# Patient Record
Sex: Female | Born: 1935
Health system: Southern US, Community
[De-identification: ages and names within clinical notes are randomized; demographics above are authoritative.]

## PROBLEM LIST (undated history)

## (undated) DIAGNOSIS — E785 Hyperlipidemia, unspecified: Secondary | ICD-10-CM

## (undated) DIAGNOSIS — G629 Polyneuropathy, unspecified: Secondary | ICD-10-CM

## (undated) DIAGNOSIS — D649 Anemia, unspecified: Secondary | ICD-10-CM

## (undated) DIAGNOSIS — C8580 Other specified types of non-Hodgkin lymphoma, unspecified site: Secondary | ICD-10-CM

## (undated) DIAGNOSIS — R161 Splenomegaly, not elsewhere classified: Secondary | ICD-10-CM

## (undated) DIAGNOSIS — Z973 Presence of spectacles and contact lenses: Secondary | ICD-10-CM

## (undated) DIAGNOSIS — I10 Essential (primary) hypertension: Secondary | ICD-10-CM

## (undated) DIAGNOSIS — R634 Abnormal weight loss: Secondary | ICD-10-CM

## (undated) DIAGNOSIS — E119 Type 2 diabetes mellitus without complications: Secondary | ICD-10-CM

## (undated) HISTORY — PX: TONSILLECTOMY: SUR1361

## (undated) HISTORY — DX: Essential (primary) hypertension: I10

## (undated) HISTORY — DX: Hyperlipidemia, unspecified: E78.5

## (undated) HISTORY — DX: Polyneuropathy, unspecified: G62.9

## (undated) HISTORY — DX: Abnormal weight loss: R63.4

## (undated) HISTORY — PX: COLONOSCOPY: SHX174

## (undated) HISTORY — DX: Type 2 diabetes mellitus without complications: E11.9

## (undated) HISTORY — PX: ESOPHAGOGASTRODUODENOSCOPY: SHX1529

## (undated) HISTORY — DX: Other specified types of non-hodgkin lymphoma, unspecified site: C85.80

## (undated) HISTORY — DX: Anemia, unspecified: D64.9

## (undated) HISTORY — DX: Splenomegaly, not elsewhere classified: R16.1

## (undated) HISTORY — PX: BREAST BIOPSY: SHX20

---

## 1987-01-29 HISTORY — PX: BREAST SURGERY: SHX581

## 1987-01-29 HISTORY — PX: POLYPECTOMY: SHX149

## 1997-07-04 ENCOUNTER — Other Ambulatory Visit: Admission: RE | Admit: 1997-07-04 | Discharge: 1997-07-04 | Payer: Self-pay | Admitting: *Deleted

## 1997-07-12 ENCOUNTER — Ambulatory Visit (HOSPITAL_COMMUNITY): Admission: RE | Admit: 1997-07-12 | Discharge: 1997-07-12 | Payer: Self-pay | Admitting: *Deleted

## 1998-03-29 ENCOUNTER — Other Ambulatory Visit: Admission: RE | Admit: 1998-03-29 | Discharge: 1998-03-29 | Payer: Self-pay | Admitting: *Deleted

## 1998-05-09 ENCOUNTER — Ambulatory Visit (HOSPITAL_COMMUNITY): Admission: RE | Admit: 1998-05-09 | Discharge: 1998-05-09 | Payer: Self-pay | Admitting: *Deleted

## 1998-05-11 ENCOUNTER — Encounter: Admission: RE | Admit: 1998-05-11 | Discharge: 1998-08-09 | Payer: Self-pay | Admitting: *Deleted

## 1998-07-14 ENCOUNTER — Ambulatory Visit (HOSPITAL_COMMUNITY): Admission: RE | Admit: 1998-07-14 | Discharge: 1998-07-14 | Payer: Self-pay | Admitting: *Deleted

## 1999-04-02 ENCOUNTER — Other Ambulatory Visit: Admission: RE | Admit: 1999-04-02 | Discharge: 1999-04-02 | Payer: Self-pay | Admitting: *Deleted

## 1999-10-10 ENCOUNTER — Ambulatory Visit (HOSPITAL_COMMUNITY): Admission: RE | Admit: 1999-10-10 | Discharge: 1999-10-10 | Payer: Self-pay | Admitting: *Deleted

## 2000-05-01 ENCOUNTER — Other Ambulatory Visit: Admission: RE | Admit: 2000-05-01 | Discharge: 2000-05-01 | Payer: Self-pay | Admitting: Internal Medicine

## 2000-05-22 ENCOUNTER — Ambulatory Visit (HOSPITAL_COMMUNITY): Admission: RE | Admit: 2000-05-22 | Discharge: 2000-05-22 | Payer: Self-pay | Admitting: Internal Medicine

## 2000-05-22 ENCOUNTER — Encounter: Payer: Self-pay | Admitting: Internal Medicine

## 2000-12-04 ENCOUNTER — Ambulatory Visit (HOSPITAL_COMMUNITY): Admission: RE | Admit: 2000-12-04 | Discharge: 2000-12-04 | Payer: Self-pay | Admitting: Internal Medicine

## 2000-12-04 ENCOUNTER — Encounter: Payer: Self-pay | Admitting: Internal Medicine

## 2001-05-06 ENCOUNTER — Other Ambulatory Visit: Admission: RE | Admit: 2001-05-06 | Discharge: 2001-05-06 | Payer: Self-pay | Admitting: Internal Medicine

## 2002-02-12 ENCOUNTER — Ambulatory Visit (HOSPITAL_COMMUNITY): Admission: RE | Admit: 2002-02-12 | Discharge: 2002-02-12 | Payer: Self-pay | Admitting: Internal Medicine

## 2002-02-12 ENCOUNTER — Encounter: Payer: Self-pay | Admitting: Internal Medicine

## 2002-02-19 ENCOUNTER — Encounter: Admission: RE | Admit: 2002-02-19 | Discharge: 2002-02-19 | Payer: Self-pay | Admitting: Internal Medicine

## 2002-02-19 ENCOUNTER — Encounter: Payer: Self-pay | Admitting: Internal Medicine

## 2003-06-02 ENCOUNTER — Other Ambulatory Visit: Admission: RE | Admit: 2003-06-02 | Discharge: 2003-06-02 | Payer: Self-pay | Admitting: Internal Medicine

## 2003-06-06 ENCOUNTER — Ambulatory Visit (HOSPITAL_COMMUNITY): Admission: RE | Admit: 2003-06-06 | Discharge: 2003-06-06 | Payer: Self-pay | Admitting: Internal Medicine

## 2003-11-04 ENCOUNTER — Emergency Department (HOSPITAL_COMMUNITY): Admission: EM | Admit: 2003-11-04 | Discharge: 2003-11-05 | Payer: Self-pay | Admitting: Emergency Medicine

## 2004-07-30 ENCOUNTER — Ambulatory Visit (HOSPITAL_COMMUNITY): Admission: RE | Admit: 2004-07-30 | Discharge: 2004-07-30 | Payer: Self-pay | Admitting: Internal Medicine

## 2005-06-12 ENCOUNTER — Other Ambulatory Visit: Admission: RE | Admit: 2005-06-12 | Discharge: 2005-06-12 | Payer: Self-pay | Admitting: Internal Medicine

## 2005-08-02 ENCOUNTER — Ambulatory Visit (HOSPITAL_COMMUNITY): Admission: RE | Admit: 2005-08-02 | Discharge: 2005-08-02 | Payer: Self-pay | Admitting: Internal Medicine

## 2005-10-29 ENCOUNTER — Ambulatory Visit: Payer: Self-pay | Admitting: Gastroenterology

## 2005-11-19 ENCOUNTER — Ambulatory Visit: Payer: Self-pay | Admitting: Gastroenterology

## 2006-10-08 ENCOUNTER — Ambulatory Visit (HOSPITAL_COMMUNITY): Admission: RE | Admit: 2006-10-08 | Discharge: 2006-10-08 | Payer: Self-pay | Admitting: Internal Medicine

## 2007-02-04 ENCOUNTER — Encounter: Admission: RE | Admit: 2007-02-04 | Discharge: 2007-02-04 | Payer: Self-pay | Admitting: Internal Medicine

## 2007-10-21 ENCOUNTER — Ambulatory Visit (HOSPITAL_COMMUNITY): Admission: RE | Admit: 2007-10-21 | Discharge: 2007-10-21 | Payer: Self-pay | Admitting: Internal Medicine

## 2008-11-18 ENCOUNTER — Ambulatory Visit (HOSPITAL_COMMUNITY): Admission: RE | Admit: 2008-11-18 | Discharge: 2008-11-18 | Payer: Self-pay | Admitting: Internal Medicine

## 2009-12-26 ENCOUNTER — Ambulatory Visit (HOSPITAL_COMMUNITY)
Admission: RE | Admit: 2009-12-26 | Discharge: 2009-12-26 | Payer: Self-pay | Source: Home / Self Care | Admitting: Internal Medicine

## 2010-11-09 ENCOUNTER — Other Ambulatory Visit (HOSPITAL_COMMUNITY): Payer: Self-pay | Admitting: Internal Medicine

## 2010-11-09 DIAGNOSIS — Z1231 Encounter for screening mammogram for malignant neoplasm of breast: Secondary | ICD-10-CM

## 2010-11-09 DIAGNOSIS — M8580 Other specified disorders of bone density and structure, unspecified site: Secondary | ICD-10-CM

## 2011-01-01 ENCOUNTER — Ambulatory Visit (HOSPITAL_COMMUNITY): Payer: Self-pay

## 2011-01-01 ENCOUNTER — Inpatient Hospital Stay (HOSPITAL_COMMUNITY)
Admission: RE | Admit: 2011-01-01 | Discharge: 2011-01-01 | Payer: Self-pay | Source: Ambulatory Visit | Attending: Internal Medicine | Admitting: Internal Medicine

## 2011-02-05 ENCOUNTER — Ambulatory Visit (HOSPITAL_COMMUNITY)
Admission: RE | Admit: 2011-02-05 | Discharge: 2011-02-05 | Disposition: A | Discharge status: Home / Self Care | Payer: Medicare Other | Source: Ambulatory Visit | Attending: Internal Medicine | Admitting: Internal Medicine

## 2011-02-05 DIAGNOSIS — M8580 Other specified disorders of bone density and structure, unspecified site: Secondary | ICD-10-CM

## 2011-02-05 DIAGNOSIS — Z1231 Encounter for screening mammogram for malignant neoplasm of breast: Secondary | ICD-10-CM | POA: Insufficient documentation

## 2011-02-28 ENCOUNTER — Other Ambulatory Visit (HOSPITAL_COMMUNITY): Payer: Self-pay | Admitting: Internal Medicine

## 2011-02-28 ENCOUNTER — Ambulatory Visit (HOSPITAL_COMMUNITY)
Admission: RE | Admit: 2011-02-28 | Discharge: 2011-02-28 | Disposition: A | Discharge status: Home / Self Care | Payer: Medicare Other | Source: Ambulatory Visit | Attending: Internal Medicine | Admitting: Internal Medicine

## 2011-02-28 DIAGNOSIS — M545 Low back pain, unspecified: Secondary | ICD-10-CM | POA: Insufficient documentation

## 2011-05-30 ENCOUNTER — Encounter: Payer: Self-pay | Admitting: Gastroenterology

## 2011-06-27 ENCOUNTER — Ambulatory Visit: Payer: Medicare Other | Admitting: Gastroenterology

## 2012-09-21 ENCOUNTER — Other Ambulatory Visit (HOSPITAL_COMMUNITY): Payer: Self-pay | Admitting: Internal Medicine

## 2012-09-21 DIAGNOSIS — Z1231 Encounter for screening mammogram for malignant neoplasm of breast: Secondary | ICD-10-CM

## 2012-09-22 ENCOUNTER — Ambulatory Visit (HOSPITAL_COMMUNITY)
Admission: RE | Admit: 2012-09-22 | Discharge: 2012-09-22 | Disposition: A | Discharge status: Home / Self Care | Payer: Medicare Other | Source: Ambulatory Visit | Attending: Internal Medicine | Admitting: Internal Medicine

## 2012-09-22 DIAGNOSIS — Z1231 Encounter for screening mammogram for malignant neoplasm of breast: Secondary | ICD-10-CM | POA: Insufficient documentation

## 2012-10-13 ENCOUNTER — Encounter: Payer: Self-pay | Admitting: Gastroenterology

## 2012-11-13 ENCOUNTER — Encounter: Payer: Self-pay | Admitting: Gastroenterology

## 2012-11-13 ENCOUNTER — Ambulatory Visit (INDEPENDENT_AMBULATORY_CARE_PROVIDER_SITE_OTHER): Payer: Medicare Other | Admitting: Gastroenterology

## 2012-11-13 ENCOUNTER — Other Ambulatory Visit (INDEPENDENT_AMBULATORY_CARE_PROVIDER_SITE_OTHER): Payer: Medicare Other

## 2012-11-13 VITALS — BP 138/80 | HR 73 | Ht 66.0 in | Wt 178.0 lb

## 2012-11-13 DIAGNOSIS — Z860100 Personal history of colon polyps, unspecified: Secondary | ICD-10-CM | POA: Insufficient documentation

## 2012-11-13 DIAGNOSIS — D500 Iron deficiency anemia secondary to blood loss (chronic): Secondary | ICD-10-CM | POA: Insufficient documentation

## 2012-11-13 DIAGNOSIS — D649 Anemia, unspecified: Secondary | ICD-10-CM

## 2012-11-13 DIAGNOSIS — D509 Iron deficiency anemia, unspecified: Secondary | ICD-10-CM | POA: Insufficient documentation

## 2012-11-13 LAB — CBC WITH DIFFERENTIAL/PLATELET
Basophils Absolute: 0 10*3/uL (ref 0.0–0.1)
Basophils Relative: 0.5 % (ref 0.0–3.0)
Eosinophils Absolute: 0.1 10*3/uL (ref 0.0–0.7)
Eosinophils Relative: 1.3 % (ref 0.0–5.0)
HCT: 31 % — ABNORMAL LOW (ref 36.0–46.0)
Hemoglobin: 10.6 g/dL — ABNORMAL LOW (ref 12.0–15.0)
Lymphocytes Relative: 14 % (ref 12.0–46.0)
Lymphs Abs: 0.9 10*3/uL (ref 0.7–4.0)
MCHC: 34.2 g/dL (ref 30.0–36.0)
MCV: 81.5 fl (ref 78.0–100.0)
Monocytes Absolute: 0.5 10*3/uL (ref 0.1–1.0)
Monocytes Relative: 7.6 % (ref 3.0–12.0)
Neutro Abs: 4.9 10*3/uL (ref 1.4–7.7)
Neutrophils Relative %: 76.6 % (ref 43.0–77.0)
Platelets: 242 10*3/uL (ref 150.0–400.0)
RBC: 3.81 Mil/uL — ABNORMAL LOW (ref 3.87–5.11)
RDW: 13.7 % (ref 11.5–14.6)
WBC: 6.4 10*3/uL (ref 4.5–10.5)

## 2012-11-13 NOTE — Assessment & Plan Note (Signed)
In view of patient's age surveillance colonoscopy is not planned for

## 2012-11-13 NOTE — Patient Instructions (Signed)
Go to the basement for labs today 

## 2012-11-13 NOTE — Assessment & Plan Note (Signed)
Laboratories not available.  It is noteworthy that the patient is improved with iron.  Recommendations #1 stool Hemoccults #2 await previous lab work and repeat CBC

## 2012-11-13 NOTE — Progress Notes (Signed)
History of Present Illness: 79 year old white female referred for evaluation of anemia.  This apparently was noted several months ago.  Blood work is not available.  Patient denies change of bowel habits, abdominal pain, melena or hematochezia.  She's on no gastric irritants.  Since taking iron several months ago her energy level has increased significantly.  An adenomatous polyp was removed in 2002.  2007 colonoscopy demonstrated diverticulosis only.    Past Medical History  Diagnosis Date  . Diabetes   . High blood pressure    History reviewed. No pertinent past surgical history. family history includes Cancer in her mother. Current Outpatient Prescriptions  Medication Sig Dispense Refill  . bisoprolol-hydrochlorothiazide (ZIAC) 5-6.25 MG per tablet       . Calcium Carbonate-Vit D-Min (CALCIUM 1200 PO) Take 1 tablet by mouth daily.      . cholecalciferol (VITAMIN D) 1000 UNITS tablet Take 1,000 Units by mouth daily.      . Cinnamon 500 MG capsule Take 500 mg by mouth daily.      . enalapril (VASOTEC) 20 MG tablet       . fish oil-omega-3 fatty acids 1000 MG capsule Take 2 g by mouth daily.      Marland Kitchen glimepiride (AMARYL) 4 MG tablet       . Magnesium 500 MG CAPS Take 1 capsule by mouth daily.      . metFORMIN (GLUCOPHAGE) 500 MG tablet Take 500 mg by mouth 2 (two) times daily with a meal.      . Multiple Vitamins-Minerals (CENTRUM CARDIO) TABS Take 1 tablet by mouth daily.      . Red Yeast Rice 600 MG TABS Take 1 tablet by mouth daily.       No current facility-administered medications for this visit.   Allergies as of 11/13/2012  . (No Known Allergies)    reports that she has never smoked. She does not have any smokeless tobacco history on file. She reports that she does not drink alcohol or use illicit drugs.     Review of Systems: Pertinent positive and negative review of systems were noted in the above HPI section. All other review of systems were otherwise negative.  Vital  signs were reviewed in today's medical record Physical Exam: General: Well developed , well nourished, no acute distress Skin: anicteric Head: Normocephalic and atraumatic Eyes:  sclerae anicteric, EOMI Ears: Normal auditory acuity Mouth: No deformity or lesions Neck: Supple, no masses or thyromegaly Lungs: Clear throughout to auscultation Heart: Regular rate and rhythm; no murmurs, rubs or bruits Abdomen: Soft, non tender and non distended. No masses, hepatosplenomegaly or hernias noted. Normal Bowel sounds Rectal:deferred Musculoskeletal: Symmetrical with no gross deformities  Skin: No lesions on visible extremities Pulses:  Normal pulses noted Extremities: No clubbing, cyanosis, edema or deformities noted Neurological: Alert oriented x 4, grossly nonfocal Cervical Nodes:  No significant cervical adenopathy Inguinal Nodes: No significant inguinal adenopathy Psychological:  Alert and cooperative. Normal mood and affect

## 2012-11-24 ENCOUNTER — Other Ambulatory Visit (INDEPENDENT_AMBULATORY_CARE_PROVIDER_SITE_OTHER): Payer: Medicare Other

## 2012-11-24 DIAGNOSIS — D649 Anemia, unspecified: Secondary | ICD-10-CM

## 2012-11-24 LAB — FECAL OCCULT BLOOD, IMMUNOCHEMICAL: Fecal Occult Bld: POSITIVE

## 2012-11-30 ENCOUNTER — Ambulatory Visit (AMBULATORY_SURGERY_CENTER): Payer: Medicare Other | Admitting: *Deleted

## 2012-11-30 VITALS — Ht 65.5 in | Wt 179.0 lb

## 2012-11-30 DIAGNOSIS — R195 Other fecal abnormalities: Secondary | ICD-10-CM

## 2012-11-30 MED ORDER — NA SULFATE-K SULFATE-MG SULF 17.5-3.13-1.6 GM/177ML PO SOLN: 1.0000 | Freq: Once | ORAL | Status: DC

## 2012-11-30 NOTE — Progress Notes (Signed)
No egg or soy allergy. ewm No problems with past sedation. ewm No home 02 or cpap use. ewm Pt doesn't have e mail. ewm

## 2012-12-01 ENCOUNTER — Ambulatory Visit (AMBULATORY_SURGERY_CENTER): Payer: Medicare Other | Admitting: Gastroenterology

## 2012-12-01 ENCOUNTER — Encounter: Payer: Self-pay | Admitting: Gastroenterology

## 2012-12-01 VITALS — BP 129/66 | HR 71 | Temp 96.7°F | Resp 20 | Ht 65.5 in | Wt 179.0 lb

## 2012-12-01 DIAGNOSIS — R195 Other fecal abnormalities: Secondary | ICD-10-CM

## 2012-12-01 DIAGNOSIS — K5730 Diverticulosis of large intestine without perforation or abscess without bleeding: Secondary | ICD-10-CM

## 2012-12-01 MED ORDER — SODIUM CHLORIDE 0.9 % IV SOLN: 500.0000 mL | INTRAVENOUS | Status: DC

## 2012-12-01 NOTE — Patient Instructions (Signed)

## 2012-12-01 NOTE — Progress Notes (Signed)
  [facility] Anesthesia Post-op Note  Patient: Jamie Mendez  Procedure(s) Performed: colonoscopy  Patient Location: [facility] - Recovery Area  Anesthesia Type: Deep Sedation/Propofol  Level of Consciousness: awake, oriented and patient cooperative  Airway and Oxygen Therapy: Patient Spontanous Breathing  Post-op Pain: none  Post-op Assessment:  Post-op Vital signs reviewed, Patient's Cardiovascular Status Stable, Respiratory Function Stable, Patent Airway, No signs of Nausea or vomiting and Pain level controlled  Post-op Vital Signs: Reviewed and stable  Complications: No apparent anesthesia complications  Stephinie Battisti E 11:03 AM

## 2012-12-01 NOTE — Progress Notes (Signed)
Patient did not experience any of the following events: a burn prior to discharge; a fall within the facility; wrong site/side/patient/procedure/implant event; or a hospital transfer or hospital admission upon discharge from the facility. (G8907)Patient did not have preoperative order for IV antibiotic SSI prophylaxis. [phone])  Scheduled upper endo. No previsit available. Pt was here for colonoscopy. Gave instructions at discharge. Care partner signed pre-procedure acknowledgement sheet. Will need to sign consent on day of procedure.

## 2012-12-01 NOTE — Op Note (Signed)
[facility] [address] [place], [address]   COLONOSCOPY PROCEDURE REPORT  PATIENT: Jamie Mendez, Jamie Mendez  MR#: [id number] BIRTHDATE: May 12, 1933 , 79  yrs. old GENDER: Female ENDOSCOPIST: Louis Meckel, MD REFERRED BY: PROCEDURE DATE:  12/01/2012 PROCEDURE:   Colonoscopy, diagnostic First Screening Colonoscopy - Avg.  risk and is 50 yrs.  old or older - No.  Prior Negative Screening - Now for repeat screening. N/A  History of Adenoma - Now for follow-up colonoscopy & has been > or = to 3 yrs.  N/A  Polyps Removed Today? No.  Recommend repeat exam, <10 yrs? No. ASA CLASS:   Class II INDICATIONS:heme-positive stool and Iron Deficiency Anemia. MEDICATIONS: MAC sedation, administered by CRNA and propofol (Diprivan) 200mg  IV  DESCRIPTION OF PROCEDURE:   After the risks benefits and alternatives of the procedure were thoroughly explained, informed consent was obtained.  A digital rectal exam revealed no abnormalities of the rectum.   The LB CF-H180AL Loaner [id number] endoscope was introduced through the anus and advanced to the terminal ileum which was intubated for a short distance. No adverse events experienced.   The quality of the prep was excellent using Suprep  The instrument was then slowly withdrawn as the colon was fully examined.      COLON FINDINGS: Mild diverticulosis was noted in the transverse colon. The remainder of the colon was normal. Retroflexed views revealed no abnormalities. The time to cecum= 4 minutes 25 seconds. Withdrawal time= 28 sec     .  The scope was withdrawn and the procedure completed. COMPLICATIONS: There were no complications.  ENDOSCOPIC IMPRESSION: Mild diverticulosis was noted in the transverse colon  No source for heme positive stool was identified  RECOMMENDATIONS: Upper endoscopy will be scheduled   eSigned:  Louis Meckel, MD 12/01/2012 11:04 AM   cc:   PATIENT NAME:  [name], [name] MR#:  [id number]

## 2012-12-01 NOTE — Progress Notes (Signed)
Pt only has one set of vital signs for recovery, pt was still on procedure room monitors when pt came into recovery, I removed pt from monitor and connected to recovery monitor, first set of vitals came up and filed. Pt was set up for upper endo and instructions were given, pt was also given discharge instructions. Received another pt in recovery, checked on pt and called family back. By that time Jamie Mendez was ready to go home. Disconnected pt from monitor and IV was taken out, pt was getting dressed. Came back to computer for validate the rest of my vital signs and my monitor had been disconnected from recovery. Procedure room must have thought they were still connected to pt when they returned and disconnected my monitor instead. So pt only has one set of vitals.  Pt was passing air in recovery, vital signs were within normal limits, pt was talking to care partner during recovery, denied pain or cramping.

## 2012-12-02 ENCOUNTER — Telehealth: Payer: Self-pay | Admitting: *Deleted

## 2012-12-02 NOTE — Telephone Encounter (Signed)
No answer, voice mail box full unable to leave message.

## 2012-12-08 ENCOUNTER — Ambulatory Visit (AMBULATORY_SURGERY_CENTER): Payer: Medicare Other | Admitting: Gastroenterology

## 2012-12-08 ENCOUNTER — Encounter: Payer: Self-pay | Admitting: Gastroenterology

## 2012-12-08 VITALS — BP 153/71 | HR 65 | Temp 97.8°F | Resp 30 | Ht 66.0 in | Wt 178.0 lb

## 2012-12-08 DIAGNOSIS — D649 Anemia, unspecified: Secondary | ICD-10-CM

## 2012-12-08 DIAGNOSIS — K222 Esophageal obstruction: Secondary | ICD-10-CM

## 2012-12-08 DIAGNOSIS — K2990 Gastroduodenitis, unspecified, without bleeding: Secondary | ICD-10-CM

## 2012-12-08 DIAGNOSIS — K2970 Gastritis, unspecified, without bleeding: Secondary | ICD-10-CM

## 2012-12-08 DIAGNOSIS — D131 Benign neoplasm of stomach: Secondary | ICD-10-CM

## 2012-12-08 MED ORDER — OMEPRAZOLE 20 MG PO CPDR: 20.0000 mg | DELAYED_RELEASE_CAPSULE | Freq: Every day | ORAL | Status: DC

## 2012-12-08 MED ORDER — SODIUM CHLORIDE 0.9 % IV SOLN: 500.0000 mL | INTRAVENOUS | Status: DC

## 2012-12-08 NOTE — Patient Instructions (Signed)

## 2012-12-08 NOTE — Progress Notes (Signed)
Patient did not experience any of the following events: a burn prior to discharge; a fall within the facility; wrong site/side/patient/procedure/implant event; or a hospital transfer or hospital admission upon discharge from the facility. (G8907)Patient did not have preoperative order for IV antibiotic SSI prophylaxis. ([id number])Patient did not have preoperative order for IV antibiotic SSI prophylaxis. [phone]) EWM

## 2012-12-08 NOTE — Op Note (Signed)
[facility] [address] [place], [address]   ENDOSCOPY PROCEDURE REPORT  PATIENT: Jamie Mendez, Jamie Mendez  MR#: [id number] BIRTHDATE: 02-May-1933 , 79  yrs. old GENDER: Female ENDOSCOPIST: Louis Meckel, MD REFERRED BY:  Lucky Cowboy, M.D. PROCEDURE DATE:  12/08/2012 PROCEDURE:  EGD w/ biopsy ASA CLASS:     Class II INDICATIONS:  Occult blood positive. MEDICATIONS: MAC sedation, administered by CRNA, propofol (Diprivan) 150mg  IV, and Simethicone 0.6cc PO TOPICAL ANESTHETIC: Cetacaine Spray  DESCRIPTION OF PROCEDURE: After the risks benefits and alternatives of the procedure were thoroughly explained, informed consent was obtained.  The    endoscope was introduced through the mouth and advanced to the third portion of the duodenum. Without limitations. The instrument was slowly withdrawn as the mucosa was fully examined.      At the GE junction there was an early esophageal stricture.  There were a few erosions at the GE junction. In the gastric fundus there was a 2 cm superficial linear ulcer with pigment and a small amount of blood at the base.  Surrounding mucosa was slightly erythematous.  Biopsies were taken.   The remainder of the upper endoscopy exam was otherwise normal. Retroflexed views revealed no abnormalities.     The scope was then withdrawn from the patient and the procedure completed.  COMPLICATIONS: There were no complications. ENDOSCOPIC IMPRESSION: 1.   superficial gastric ulcer with stigmata of recent hemorrhage 2.  esophageal stricture 3.  erosive esophagitis  RECOMMENDATIONS: 1.  begin omeprazole 20 mg daily 2.  followup Hemoccults in one month  REPEAT EXAM:  eSigned:  Louis Meckel, MD 12/08/2012 2:21 PM   CC:

## 2012-12-08 NOTE — Progress Notes (Signed)
Called to room to assist during endoscopic procedure.  Patient ID and intended procedure confirmed with present staff. Received instructions for my participation in the procedure from the performing physician.  

## 2012-12-09 ENCOUNTER — Telehealth: Payer: Self-pay | Admitting: *Deleted

## 2012-12-09 NOTE — Telephone Encounter (Deleted)
  Follow up Call-  Call back number 12/08/2012 12/01/2012  Post procedure Call Back phone  # [phone]  Permission to leave phone message Yes Yes     Patient questions:  Do you have a fever, pain , or abdominal swelling? no Pain Score  0 *  Have you tolerated food without any problems? yes  Have you been able to return to your normal activities? yes  Do you have any questions about your discharge instructions: Diet   no Medications  no Follow up visit  no  Do you have questions or concerns about your Care? no  Actions: * If pain score is 4 or above: No action needed, pain <4.

## 2012-12-09 NOTE — Telephone Encounter (Signed)
Additional note in error.

## 2012-12-22 ENCOUNTER — Encounter: Payer: Self-pay | Admitting: Gastroenterology

## 2012-12-29 ENCOUNTER — Encounter: Payer: Self-pay | Admitting: Internal Medicine

## 2012-12-29 ENCOUNTER — Ambulatory Visit (INDEPENDENT_AMBULATORY_CARE_PROVIDER_SITE_OTHER): Payer: Medicare Other | Admitting: Internal Medicine

## 2012-12-29 VITALS — BP 140/64 | HR 72 | Temp 97.9°F | Resp 18 | Ht 65.0 in | Wt 176.0 lb

## 2012-12-29 DIAGNOSIS — Z79899 Other long term (current) drug therapy: Secondary | ICD-10-CM

## 2012-12-29 DIAGNOSIS — E1121 Type 2 diabetes mellitus with diabetic nephropathy: Secondary | ICD-10-CM | POA: Insufficient documentation

## 2012-12-29 DIAGNOSIS — K2100 Gastro-esophageal reflux disease with esophagitis, without bleeding: Secondary | ICD-10-CM

## 2012-12-29 DIAGNOSIS — E782 Mixed hyperlipidemia: Secondary | ICD-10-CM

## 2012-12-29 DIAGNOSIS — E119 Type 2 diabetes mellitus without complications: Secondary | ICD-10-CM

## 2012-12-29 DIAGNOSIS — I10 Essential (primary) hypertension: Secondary | ICD-10-CM

## 2012-12-29 DIAGNOSIS — E559 Vitamin D deficiency, unspecified: Secondary | ICD-10-CM

## 2012-12-29 LAB — BASIC METABOLIC PANEL WITH GFR
BUN: 19 mg/dL (ref 6–23)
Calcium: 9.2 mg/dL (ref 8.4–10.5)
Chloride: 100 mEq/L (ref 96–112)
Creat: 1.09 mg/dL (ref 0.50–1.10)
GFR, Est Non African American: 49 mL/min — ABNORMAL LOW
Glucose, Bld: 386 mg/dL — ABNORMAL HIGH (ref 70–99)
Potassium: 4.7 mEq/L (ref 3.5–5.3)

## 2012-12-29 LAB — HEPATIC FUNCTION PANEL
ALT: 19 U/L (ref 0–35)
AST: 20 U/L (ref 0–37)
Albumin: 3.5 g/dL (ref 3.5–5.2)
Alkaline Phosphatase: 128 U/L — ABNORMAL HIGH (ref 39–117)
Total Protein: 6.3 g/dL (ref 6.0–8.3)

## 2012-12-29 LAB — CBC WITH DIFFERENTIAL/PLATELET
Basophils Absolute: 0 10*3/uL (ref 0.0–0.1)
Hemoglobin: 10 g/dL — ABNORMAL LOW (ref 12.0–15.0)
Lymphocytes Relative: 19 % (ref 12–46)
Lymphs Abs: 0.9 10*3/uL (ref 0.7–4.0)
MCHC: 32.5 g/dL (ref 30.0–36.0)
MCV: 81.7 fL (ref 78.0–100.0)
Monocytes Relative: 6 % (ref 3–12)
Neutro Abs: 3.4 10*3/uL (ref 1.7–7.7)
Neutrophils Relative %: 73 % (ref 43–77)
RBC: 3.77 MIL/uL — ABNORMAL LOW (ref 3.87–5.11)

## 2012-12-29 LAB — TSH: TSH: 1.497 u[IU]/mL (ref 0.350–4.500)

## 2012-12-29 LAB — HEMOGLOBIN A1C: Mean Plasma Glucose: 258 mg/dL — ABNORMAL HIGH (ref <117)

## 2012-12-29 LAB — LIPID PANEL
LDL Cholesterol: 86 mg/dL (ref 0–99)
Triglycerides: 78 mg/dL (ref <150)

## 2012-12-29 MED ORDER — METFORMIN HCL ER 500 MG PO TB24: ORAL_TABLET | ORAL | Status: DC

## 2012-12-29 NOTE — Patient Instructions (Signed)

## 2012-12-29 NOTE — Progress Notes (Signed)
Patient ID: Jamie Mendez, female   DOB: 12/07/1933, 79 y.o.   MRN: [id number]   This very nice 79 yo WF presents for 3 month follow up with Hypertension, Hyperlipidemia, T2 diabetes and Vitamin D deficiency.    BP has been controlled at home. Today's BP is 140/64. Patient denies any cardiac type chest pain, palpitations, dyspnea/orthopnea/PND, dizziness, claudication, or dependent edema.   Hyperlipidemia is controlled with diet & RYRE. Last cholesterol was  152, Triglycerides were 91, HDL 42, and LDL 92 at goal. Patient denies myalgias or other med SE's.    Also, the patient has history of T2 diabetes with last A1c of  7.7% and FBG of 376 this am off Metformin x 3 weeks because of severe diarrhea (It's suspected she was given Immediate release MF in Error). Despite her high glucoses, patient denies any symptoms of reactive hypoglycemia, diabetic polys, paresthesias or visual blurring.   Further, Patient has history of vitamin D deficiency with last vitamin D of 114 and was advised to taper her dose. Patient supplements vitamin without any suspected side-effects.  Current Outpatient Prescriptions on File Prior to Visit  Medication Sig Dispense Refill  . bisoprolol-hydrochlorothiazide (ZIAC) 5-6.25 MG per tablet       . Calcium Carbonate-Vit D-Min (CALCIUM 1200 PO) Take 1 tablet by mouth daily.      . cholecalciferol (VITAMIN D) 1000 UNITS tablet Take 1,000 Units by mouth daily.      . Cinnamon 500 MG capsule Take 500 mg by mouth daily.      . enalapril (VASOTEC) 20 MG tablet       . fish oil-omega-3 fatty acids 1000 MG capsule Take 2 g by mouth daily.      Marland Kitchen glimepiride (AMARYL) 4 MG tablet       . Magnesium 500 MG CAPS Take 1 capsule by mouth daily.      . Multiple Vitamins-Minerals (CENTRUM CARDIO) TABS Take 1 tablet by mouth daily.      Marland Kitchen omeprazole (PRILOSEC) 20 MG capsule Take 1 capsule (20 mg total) by mouth daily.  30 capsule  2  . Red Yeast Rice 600 MG TABS Take 1 tablet by mouth  daily.       No current facility-administered medications on file prior to visit.     Allergies  Allergen Reactions  . Aspirin Other (See Comments)    Nose bleeds  . Capoten [Captopril] Other (See Comments)    Mouth numbness   . Levemir [Insulin Detemir] Other (See Comments)    Decreased blood sugar  . Micronase [Glyburide]   . Onglyza [Saxagliptin] Other (See Comments)    Elevates blood sugar  . Statins     Numbness in face  . Zocor [Simvastatin] Other (See Comments)    Extreme fatigue    PMHx:   Past Medical History  Diagnosis Date  . Diabetes   . High blood pressure   . Hyperlipidemia   . Neuropathy     FHx:    Reviewed / unchanged  SHx:    Reviewed / unchanged  Systems Review: Constitutional: Denies fever, chills, wt changes, headaches, insomnia, fatigue, night sweats, change in appetite. Eyes: Denies redness, blurred vision, diplopia, discharge, itchy, watery eyes.  ENT: Denies discharge, congestion, post nasal drip, epistaxis, sore throat, earache, hearing loss, dental pain, tinnitus, vertigo, sinus pain, snoring.  CV: Denies chest pain, palpitations, irregular heartbeat, syncope, dyspnea, diaphoresis, orthopnea, PND, claudication, edema. Respiratory: denies cough, dyspnea, DOE, pleurisy, hoarseness, laryngitis, wheezing.  Gastrointestinal:  Denies dysphagia, odynophagia, heartburn, reflux, water brash, abdominal pain or cramps, nausea, vomiting, bloating, diarrhea, constipation, hematemesis, melena, hematochezia,  Hemorrhoids. Genitourinary: Denies dysuria, frequency, urgency, nocturia, hesitancy, discharge, hematuria, flank pain. Musculoskeletal: Denies arthralgias, myalgias, stiffness, jt. swelling, pain, limp, strain/sprain.  Skin: Denies pruritus, rash, hives, warts, acne, eczema, change in skin lesion(s). Neuro: No weakness, tremor, incoordination, spasms, paresthesia, or pain. Psychiatric: Denies confusion, memory loss, or sensory loss. Endo: Denies change  in weight, skin, hair change.  Heme/Lymph: No excessive bleeding, bruising, orenlarged lymph nodes.  Filed Vitals:   12/29/12 1014  BP: 140/64  Pulse: 72  Temp: 97.9 F (36.6 C)  Resp: 18    Estimated body mass index is 29.29 kg/(m^2) as calculated from the following:   Height as of this encounter: 5\' 5"  (1.651 m).   Weight as of this encounter: 176 lb (79.833 kg).  On Exam: Appears well nourished - in no distress. Eyes: PERRLA, EOMs, conjunctiva no swelling or erythema. Sinuses: No frontal/maxillary tenderness ENT/Mouth: EAC's clear, TM's nl w/o erythema, bulging. Nares clear w/o erythema, swelling, exudates. Oropharynx clear without erythema or exudates. Oral hygiene is good. Tongue normal, non obstructing. Hearing intact.  Neck: Supple. Thyroid nl. Car 2+/2+ without bruits, nodes or JVD. Chest: Respirations nl with BS clear & equal w/o rales, rhonchi, wheezing or stridor.  Cor: Heart sounds normal w/ regular rate and rhythm without sig. murmurs, gallops, clicks, or rubs. Peripheral pulses normal and equal  without edema.  Abdomen: Soft & bowel sounds normal. Non-tender w/o guarding, rebound, hernias, masses, or organomegaly.  Lymphatics: Unremarkable.  Musculoskeletal: Full ROM all peripheral extremities, joint stability, 5/5 strength, and normal gait.  Skin: Warm, dry without exposed rashes, lesions, ecchymosis apparent.  Neuro: Cranial nerves intact, reflexes equal bilaterally. Sensory-motor testing grossly intact. Tendon reflexes grossly intact.  Pysch: Alert & oriented x 3. Insight and judgement nl & appropriate. No ideations.  Assessment and Plan:  1. Hypertension - Continue monitor blood pressure at home. Continue diet/meds same.  2. Hyperlipidemia - Continue diet/meds, exercise,& lifestyle modifications. Continue monitor periodic cholesterol/liver & renal functions   3. Diabetes - continue recommend prudent low glycemic diet, weight control, regular exercise, diabetic  monitoring and periodic eye exams. Recc restart Extended Release MF and compliment with Loperamide  1-2 tabs bid prn.  4. Vitamin D Deficiency - Continue supplementation.  Further disposition pending results of labs.

## 2012-12-30 LAB — INSULIN, FASTING: Insulin fasting, serum: 7 u[IU]/mL (ref 3–28)

## 2012-12-31 ENCOUNTER — Other Ambulatory Visit: Payer: Self-pay

## 2012-12-31 DIAGNOSIS — R195 Other fecal abnormalities: Secondary | ICD-10-CM

## 2013-01-19 ENCOUNTER — Other Ambulatory Visit (INDEPENDENT_AMBULATORY_CARE_PROVIDER_SITE_OTHER): Payer: Medicare Other

## 2013-01-19 DIAGNOSIS — R195 Other fecal abnormalities: Secondary | ICD-10-CM

## 2013-01-19 LAB — HEMOCCULT SLIDES (X 3 CARDS)
OCCULT 1: NEGATIVE
OCCULT 2: NEGATIVE
OCCULT 3: NEGATIVE
OCCULT 4: NEGATIVE

## 2013-01-26 ENCOUNTER — Telehealth: Payer: Self-pay | Admitting: *Deleted

## 2013-01-26 NOTE — Telephone Encounter (Signed)
Form has to be signed by patient

## 2013-01-26 NOTE — Telephone Encounter (Signed)
Dr Arlyce Dice  Is the Cologuard test to check for malignant neoplasm of the colon ?  The patient has to come in and sign the form, for me to fax to the lab

## 2013-01-26 NOTE — Telephone Encounter (Signed)
Message copied by Marlowe Kays on Tue Jan 26, 2013  8:07 AM ------      Message from: HUNT, [place] R      Created: Wed Jan 20, 2013 11:39 AM      Regarding: Cologuard       Gailene Youkhana,            Please schedule this for the pt.            Thanks,      Bonita Quin            ----- Message -----         From: Louis Meckel, MD         Sent: 01/20/2013  11:22 AM           To: Lily Lovings, RN            Heme negative.      Let's check a cologuard, repeat CBC 6 weeks       ------

## 2013-01-28 NOTE — Telephone Encounter (Signed)
yes

## 2013-02-08 NOTE — Telephone Encounter (Signed)
Left message for patient to contact me.

## 2013-02-12 NOTE — Telephone Encounter (Signed)
Patient has not returned my call

## 2013-02-23 NOTE — Telephone Encounter (Signed)
Dr Deatra Ina, FYI-I have tried to contact this patient to come in and sigh Cologuard form but she has not contacted me back

## 2013-03-30 ENCOUNTER — Encounter: Payer: Self-pay | Admitting: Emergency Medicine

## 2013-03-30 ENCOUNTER — Ambulatory Visit (INDEPENDENT_AMBULATORY_CARE_PROVIDER_SITE_OTHER): Payer: Medicare Other | Admitting: Emergency Medicine

## 2013-03-30 VITALS — BP 148/66 | HR 72 | Temp 98.6°F | Resp 18 | Ht 66.0 in | Wt 166.0 lb

## 2013-03-30 DIAGNOSIS — R5381 Other malaise: Secondary | ICD-10-CM

## 2013-03-30 DIAGNOSIS — I10 Essential (primary) hypertension: Secondary | ICD-10-CM

## 2013-03-30 DIAGNOSIS — E1159 Type 2 diabetes mellitus with other circulatory complications: Secondary | ICD-10-CM

## 2013-03-30 DIAGNOSIS — D649 Anemia, unspecified: Secondary | ICD-10-CM

## 2013-03-30 DIAGNOSIS — E782 Mixed hyperlipidemia: Secondary | ICD-10-CM

## 2013-03-30 DIAGNOSIS — R5383 Other fatigue: Secondary | ICD-10-CM

## 2013-03-30 DIAGNOSIS — Z111 Encounter for screening for respiratory tuberculosis: Secondary | ICD-10-CM

## 2013-03-30 DIAGNOSIS — M8580 Other specified disorders of bone density and structure, unspecified site: Secondary | ICD-10-CM

## 2013-03-30 DIAGNOSIS — Z23 Encounter for immunization: Secondary | ICD-10-CM

## 2013-03-30 DIAGNOSIS — E559 Vitamin D deficiency, unspecified: Secondary | ICD-10-CM

## 2013-03-30 DIAGNOSIS — Z0000 Encounter for general adult medical examination without abnormal findings: Secondary | ICD-10-CM

## 2013-03-30 LAB — CBC WITH DIFFERENTIAL/PLATELET
Basophils Absolute: 0.1 10*3/uL (ref 0.0–0.1)
Basophils Relative: 1 % (ref 0–1)
EOS ABS: 0.1 10*3/uL (ref 0.0–0.7)
EOS PCT: 2 % (ref 0–5)
HEMATOCRIT: 28.6 % — AB (ref 36.0–46.0)
Hemoglobin: 9.6 g/dL — ABNORMAL LOW (ref 12.0–15.0)
LYMPHS ABS: 0.9 10*3/uL (ref 0.7–4.0)
LYMPHS PCT: 18 % (ref 12–46)
MCH: 25.1 pg — AB (ref 26.0–34.0)
MCHC: 33.6 g/dL (ref 30.0–36.0)
MCV: 74.9 fL — AB (ref 78.0–100.0)
Monocytes Absolute: 0.4 10*3/uL (ref 0.1–1.0)
Monocytes Relative: 7 % (ref 3–12)
Neutro Abs: 3.7 10*3/uL (ref 1.7–7.7)
Neutrophils Relative %: 72 % (ref 43–77)
PLATELETS: 254 10*3/uL (ref 150–400)
RBC: 3.82 MIL/uL — AB (ref 3.87–5.11)
RDW: 15.9 % — ABNORMAL HIGH (ref 11.5–15.5)
WBC: 5.1 10*3/uL (ref 4.0–10.5)

## 2013-03-30 LAB — LIPID PANEL
CHOLESTEROL: 154 mg/dL (ref 0–200)
HDL: 35 mg/dL — AB (ref >39)
LDL CALC: 101 mg/dL — AB (ref 0–99)
TRIGLYCERIDES: 89 mg/dL (ref <150)
Total CHOL/HDL Ratio: 4.4 Ratio
VLDL: 18 mg/dL (ref 0–40)

## 2013-03-30 MED ORDER — BISOPROLOL FUMARATE 5 MG PO TABS: 5.0000 mg | ORAL_TABLET | Freq: Every day | ORAL | Status: DC

## 2013-03-30 NOTE — Progress Notes (Signed)
Patient ID: Jamie Mendez, female   DOB: Jul 16, 1933, 80 y.o.   MRN: [id number] Subjective:   Jamie Mendez is a 80 y.o. female who presents for Medicare Annual Wellness Visit and complete physical.    Date of last medicare wellness visit is unknown.  She has been doing well overall but notes mild fatigue with cold weather and decreased activity. She notes BP has been good. She notes BS 150s and has a difficult time taking metformin AD with diarrhea. She notes Imodium helps. She did have colonoscopy and EGD last 11/14 with Diverticulosis/ esophageal stricture. She checks feet routinely denies any new problems, she still has thick nails and dryness.   Her blood pressure has been controlled at home, today their BP is BP: 148/66 mmHg She does workout. She denies chest pain, shortness of breath, dizziness.  She is on cholesterol medication and denies myalgias. Her cholesterol is not at goal. The cholesterol last visit was:   Lab Results  Component Value Date   CHOL 154 03/30/2013   HDL 35* 03/30/2013   LDLCALC 101* 03/30/2013   TRIG 89 03/30/2013   CHOLHDL 4.4 03/30/2013   Lab Results  Component Value Date   ALT 19 12/29/2012   AST 20 12/29/2012   ALKPHOS 128* 12/29/2012   BILITOT 0.6 12/29/2012   Lab Results  Component Value Date   WBC 4.7 12/29/2012   HGB 10.0* 12/29/2012   HCT 30.8* 12/29/2012   MCV 81.7 12/29/2012   PLT 217 12/29/2012   She has been working on diet and exercise for diabetes, and denies foot ulcerations, hypoglycemia , paresthesia of the feet, polydipsia, polyuria, visual disturbances and weight loss. Last A1C in the office was:  Lab Results  Component Value Date   HGBA1C 9.1* 03/30/2013   Lab Results  Component Value Date   CREATININE 1.09 12/29/2012   BUN 19 12/29/2012   NA 137 12/29/2012   K 4.7 12/29/2012   CL 100 12/29/2012   CO2 28 12/29/2012   Patient is on Vitamin D supplement.   No components found with this basename: VITD25OH  vit D 112   Names of Other  Physician/Practitioners you currently use:  Patient Care Team: Unk Pinto, MD as PCP - General (Internal Medicine) Fabio Pierce, MD as Consulting Physician (Ophthalmology) Inda Castle, MD as Consulting Physician (Gastroenterology) Cammie Sickle., MD as Consulting Physician (Orthopedic Surgery) Jannette Spanner, MD as Referring Physician (Dermatology) Delman Cheadle, eye doctor Lavone Neri, Dentist   Medication Review Current Outpatient Prescriptions on File Prior to Visit  Medication Sig Dispense Refill  . Calcium Carbonate-Vit D-Min (CALCIUM 1200 PO) Take 1 tablet by mouth daily.      . cholecalciferol (VITAMIN D) 1000 UNITS tablet Take 1,000 Units by mouth daily.      . Cinnamon 500 MG capsule Take 500 mg by mouth daily.      . enalapril (VASOTEC) 20 MG tablet       . fish oil-omega-3 fatty acids 1000 MG capsule Take 2 g by mouth daily.      Marland Kitchen glimepiride (AMARYL) 4 MG tablet       . Magnesium 500 MG CAPS Take 1 capsule by mouth daily.      . metFORMIN (GLUCOPHAGE XR) 500 MG 24 hr tablet Take 1 or 2 tablets twice daily after meals to control blood sugars - if get diarrhea take 1 or 2 loperamide (Immodium) 2 mg twice daily with the metformin  120 tablet  11  .  Multiple Vitamins-Minerals (CENTRUM CARDIO) TABS Take 1 tablet by mouth daily.      Marland Kitchen omeprazole (PRILOSEC) 20 MG capsule Take 1 capsule (20 mg total) by mouth daily.  30 capsule  2  . Red Yeast Rice 600 MG TABS Take 1 tablet by mouth daily.       No current facility-administered medications on file prior to visit.    Current Problems (verified) Patient Active Problem List   Diagnosis Date Noted  . Unspecified essential hypertension 12/29/2012  . Mixed hyperlipidemia 12/29/2012  . Type II or unspecified type diabetes mellitus without mention of complication, not stated as uncontrolled 12/29/2012  . Unspecified vitamin D deficiency 12/29/2012  . Reflux esophagitis 12/29/2012  . Anemia 11/13/2012  . Personal history of  colonic polyps 11/13/2012    Screening Tests Health Maintenance  Topic Date Due  . Zostavax  06/24/1995  . Influenza Vaccine  08/28/2012  . Tetanus/tdap  01/28/2017  . Colonoscopy  12/02/2022  . Pneumococcal Polysaccharide Vaccine Age 73 And Over  Completed  EGD 12/08/12- Ulcer/ Esophageal stricture EYE, 2013 Dentist, Q 86months EGD, 12/08/12  Immunization History  Administered Date(s) Administered  . PPD Test 03/30/2013  . Pneumococcal Polysaccharide-23 11/10/2002, 03/30/2013  . Td 03/02/1996, 01/29/2007    Preventative care: Last colonoscopy: 12/01/12 diverticulosis Last mammogram: 09/22/12 Last pap smear/pelvic exam: 2007 wnl, refuses future  DEXA:02/15/11 osteopenia  Prior vaccinations: TD or Tdap: 2009  Influenza: 11/18/11  Pneumococcal: 03/30/2013, 10/13/4 Shingles/Zostavax: Declines with cost  History reviewed: allergies, current medications, past family history, past medical history, past social history, past surgical history and problem list  Risk Factors: Osteoporosis: postmenopausal estrogen deficiency History of fracture in the past year: no  Tobacco History  Smoking status  . Never Smoker   Smokeless tobacco  . Never Used   She does not smoke.  Patient is not a former smoker. Are there smokers in your home (other than you)?  No  Alcohol Current alcohol use: none  Caffeine Current caffeine use: coffee 1 /day  Exercise Current exercise habits: Home exercise routine includes walking .5  hrs per day.  Current exercise: housecleaning  Nutrition/Diet Current diet: in general, a "healthy" diet    Cardiac risk factors: advanced age (older than 69 for men, 92 for women), diabetes mellitus, dyslipidemia and hypertension.  Depression Screen Nurse depression screen reviewed.  (Note: if answer to either of the following is "Yes", a more complete depression screening is indicated)   Q1: Over the past two weeks, have you felt down, depressed or hopeless?  No  Q2: Over the past two weeks, have you felt little interest or pleasure in doing things? No  Have you lost interest or pleasure in daily life? No  Do you often feel hopeless? No  Do you cry easily over simple problems? No  Activities of Daily Living Nurse ADLs screen reviewed.  In your present state of health, do you have any difficulty performing the following activities?:  Driving? No Managing money?  No Feeding yourself? No Getting from bed to chair? No Climbing a flight of stairs? No Preparing food and eating?: No Bathing or showering? No Getting dressed: No Getting to the toilet? No Using the toilet:No Moving around from place to place: No In the past year have you fallen or had a near fall?:No   Are you sexually active?  No  Do you have more than one partner?  No  Vision Difficulties: No  Hearing Difficulties: No Do you often ask people to  speak up or repeat themselves? No Do you experience ringing or noises in your ears? No Do you have difficulty understanding soft or whispered voices? No  Cognition  Do you feel that you have a problem with memory? No  Do you often misplace items? No  Do you feel safe at home?  Yes  Advanced directives Does patient have a Welcome? No Does patient have a Living Will? No    Objective:     Vision and hearing screens reviewed.   Blood pressure 148/66, pulse 72, temperature 98.6 F (37 C), temperature source Temporal, resp. rate 18, height 5\' 6"  (1.676 m), weight 166 lb (75.297 kg). Body mass index is 26.81 kg/(m^2).  General appearance: alert, no distress, WD/WN,  female Cognitive Testing  Alert? Yes  Normal Appearance?Yes  Oriented to person? Yes  Place? Yes   Time? Yes  Recall of three objects?  Yes  Can perform simple calculations? Yes  Displays appropriate judgment?Yes  Can read the correct time from a watch face?Yes  HEENT: normocephalic, sclerae anicteric, TMs pearly, nares patent, no  discharge or erythema, pharynx normal Oral cavity: MMM, no lesions Neck: supple, no lymphadenopathy, no thyromegaly, no masses Heart: RRR, normal S1, S2, no murmurs Lungs: CTA bilaterally, no wheezes, rhonchi, or rales Abdomen: +bs, soft, non tender, non distended, no masses, no hepatomegaly, no splenomegaly Musculoskeletal: nontender, no swelling, no obvious deformity Extremities: no edema, no cyanosis, no clubbing Pulses: 2+ symmetric, upper and lower extremities, normal cap refill Neurological: alert, oriented x 3, CN2-12 intact, strength normal upper extremities and lower extremities, sensation normal throughout, DTRs 2+ throughout, no cerebellar signs, gait normal Skin: thick yellow nails x 10, dry skin both feet with callus/ bunions at great toes lateral/ base Psychiatric: normal affect, behavior normal, pleasant  Breast: nontender, no masses or lumps, no skin changes, no nipple discharge or inversion, no axillary lymphadenopathy Gyn:  Pap REFUSED EKG WNL AORTA SCAN WNL   Assessment:  1. CPE- Update screening labs/ History/ Immunizations/ Testing as needed. Advised healthy diet, QD exercise, increase H20 and continue RX/ Vitamins AD.  2. 3 month F/U for HTN, Cholesterol,DM, D. Deficient. Needs healthy diet, cardio QD and obtain healthy weight. Check Labs, Check BP if >130/80 call office, Check BS if >200 call office. Add Xigduo 05/998 take 1 po BID SX #28, call with results. Callus/ Bunion/ dry skin DM- Advised needs podiatry evaluation with  DM Hx, epsom salt soaks, vaseline treatment QD, monitor QD and call with any concerns/ adverse changes  3. Diarrhea- trial of new DM medicine to see if any improvement, w/c with results  4. Anemia HX/ Fatigue- check labs, increase activity and H2O     Plan:  See above.  During the course of the visit the patient was educated and counseled about appropriate screening and preventive services including:    Pneumococcal vaccine    Screening mammography  Bone densitometry screening  Screening recommendations, referrals:  Vaccinations: Tdap vaccine no {Responses; yes/no/not indicated:1655 Influenza vaccine no Pneumococcal vaccine yes Shingles vaccine no Hep B vaccine no  Nutrition assessed and recommended  Colonoscopy yes Mammogram yes Pap smear no Pelvic exam no Recommended yearly ophthalmology/optometry visit for glaucoma screening and checkup Recommended yearly dental visit for hygiene and checkup Advanced directives - not done  Conditions/risks identified: BMI: Discussed weight loss, diet, and increase physical activity.  Increase physical activity: AHA recommends 150 minutes of physical activity a week.  Medications reviewed DEXA- ordered Diabetes at goal,  ACE/ARB therapy Yes. Urinary Incontinence is not an issue: discussed non pharmacology and pharmacology options.  Fall risk: low- discussed PT, home fall assessment, medications.   Medicare Attestation I have personally reviewed: The patient's medical and social history Their use of alcohol, tobacco or illicit drugs Their current medications and supplements The patient's functional ability including ADLs,fall risks, home safety risks, cognitive, and hearing and visual impairment Diet and physical activities Evidence for depression or mood disorders  The patient's weight, height, BMI, and visual acuity have been recorded in the chart.  I have made referrals, counseling, and provided education to the patient based on review of the above and I have provided the patient with a written personalized care plan for preventive services.     Jamie Mendez, R, PA-C   04/01/2013   CPT S9702 first AWV CPT [id number] subsequent AWV

## 2013-03-30 NOTE — Patient Instructions (Signed)
Diabetes and Exercise Exercising regularly is important. It is not just about losing weight. It has many health benefits, such as:  Improving your overall fitness, flexibility, and endurance.  Increasing your bone density.  Helping with weight control.  Decreasing your body fat.  Increasing your muscle strength.  Reducing stress and tension.  Improving your overall health. People with diabetes who exercise gain additional benefits because exercise:  Reduces appetite.  Improves the body's use of blood sugar (glucose).  Helps lower or control blood glucose.  Decreases blood pressure.  Helps control blood lipids (such as cholesterol and triglycerides).  Improves the body's use of the hormone insulin by:  Increasing the body's insulin sensitivity.  Reducing the body's insulin needs.  Decreases the risk for heart disease because exercising:  Lowers cholesterol and triglycerides levels.  Increases the levels of good cholesterol (such as high-density lipoproteins [HDL]) in the body.  Lowers blood glucose levels. YOUR ACTIVITY PLAN  Choose an activity that you enjoy and set realistic goals. Your health care provider or diabetes educator can help you make an activity plan that works for you. You can break activities into 2 or 3 sessions throughout the day. Doing so is as good as one long session. Exercise ideas include:  Taking the dog for a walk.  Taking the stairs instead of the elevator.  Dancing to your favorite song.  Doing your favorite exercise with a friend. RECOMMENDATIONS FOR EXERCISING WITH TYPE 1 OR TYPE 2 DIABETES   Check your blood glucose before exercising. If blood glucose levels are greater than 240 mg/dL, check for urine ketones. Do not exercise if ketones are present.  Avoid injecting insulin into areas of the body that are going to be exercised. For example, avoid injecting insulin into:  The arms when playing tennis.  The legs when  jogging.  Keep a record of:  Food intake before and after you exercise.  Expected peak times of insulin action.  Blood glucose levels before and after you exercise.  The type and amount of exercise you have done.  Review your records with your health care provider. Your health care provider will help you to develop guidelines for adjusting food intake and insulin amounts before and after exercising.  If you take insulin or oral hypoglycemic agents, watch for signs and symptoms of hypoglycemia. They include:  Dizziness.  Shaking.  Sweating.  Chills.  Confusion.  Drink plenty of water while you exercise to prevent dehydration or heat stroke. Body water is lost during exercise and must be replaced.  Talk to your health care provider before starting an exercise program to make sure it is safe for you. Remember, almost any type of activity is better than none. Document Released: 04/06/2003 Document Revised: 09/16/2012 Document Reviewed: 06/23/2012 [facility] Patient Information 2014 [place]. Pneumococcal Vaccine, Polyvalent solution for injection What is this medicine? PNEUMOCOCCAL VACCINE, POLYVALENT (NEU mo KOK al vak SEEN, pol ee VEY luhnt) is a vaccine to prevent pneumococcus bacteria infection. These bacteria are a major cause of ear infections, Strep throat infections, and serious pneumonia, meningitis, or blood infections worldwide. These vaccines help the body to produce antibodies (protective substances) that help your body defend against these bacteria. This vaccine is recommended for people 30 years of age and older with health problems. It is also recommended for all adults over 57 years old. This vaccine will not treat an infection. This medicine may be used for other purposes; ask your health care provider or pharmacist if you  have questions. COMMON BRAND NAME(S): Pneumovax 23 What should I tell my health care provider before I take this medicine? They need to know  if you have any of these conditions: -bleeding problems -bone marrow or organ transplant -cancer, Hodgkin's disease -fever -infection -immune system problems -low platelet count in the blood -seizures -an unusual or allergic reaction to pneumococcal vaccine, diphtheria toxoid, other vaccines, latex, other medicines, foods, dyes, or preservatives -pregnant or trying to get pregnant -breast-feeding How should I use this medicine? This vaccine is for injection into a muscle or under the skin. It is given by a health care professional. A copy of Vaccine Information Statements will be given before each vaccination. Read this sheet carefully each time. The sheet may change frequently. Talk to your pediatrician regarding the use of this medicine in children. While this drug may be prescribed for children as young as 45 years of age for selected conditions, precautions do apply. Overdosage: If you think you have taken too much of this medicine contact a poison control center or emergency room at once. NOTE: This medicine is only for you. Do not share this medicine with others. What if I miss a dose? It is important not to miss your dose. Call your doctor or health care professional if you are unable to keep an appointment. What may interact with this medicine? -medicines for cancer chemotherapy -medicines that suppress your immune function -medicines that treat or prevent blood clots like warfarin, enoxaparin, and dalteparin -steroid medicines like prednisone or cortisone This list may not describe all possible interactions. Give your health care provider a list of all the medicines, herbs, non-prescription drugs, or dietary supplements you use. Also tell them if you smoke, drink alcohol, or use illegal drugs. Some items may interact with your medicine. What should I watch for while using this medicine? Mild fever and pain should go away in 3 days or less. Report any unusual symptoms to your doctor  or health care professional. What side effects may I notice from receiving this medicine? Side effects that you should report to your doctor or health care professional as soon as possible: -allergic reactions like skin rash, itching or hives, swelling of the face, lips, or tongue -breathing problems -confused -fever over 102 degrees F -pain, tingling, numbness in the hands or feet -seizures -unusual bleeding or bruising -unusual muscle weakness Side effects that usually do not require medical attention (report to your doctor or health care professional if they continue or are bothersome): -aches and pains -diarrhea -fever of 102 degrees F or less -headache -irritable -loss of appetite -pain, tender at site where injected -trouble sleeping This list may not describe all possible side effects. Call your doctor for medical advice about side effects. You may report side effects to FDA at 1-800-FDA-1088. Where should I keep my medicine? This does not apply. This vaccine is given in a clinic, pharmacy, doctor's office, or other health care setting and will not be stored at home. NOTE: This sheet is a summary. It may not cover all possible information. If you have questions about this medicine, talk to your doctor, pharmacist, or health care provider.  2014, Elsevier/Gold Standard. (2007-08-21 14:32:37)

## 2013-03-31 LAB — URINALYSIS, ROUTINE W REFLEX MICROSCOPIC
Bilirubin Urine: NEGATIVE
Glucose, UA: 100 mg/dL — AB
HGB URINE DIPSTICK: NEGATIVE
Ketones, ur: NEGATIVE mg/dL
LEUKOCYTES UA: NEGATIVE
NITRITE: NEGATIVE
PROTEIN: NEGATIVE mg/dL
Specific Gravity, Urine: 1.018 (ref 1.005–1.030)
UROBILINOGEN UA: 1 mg/dL (ref 0.0–1.0)
pH: 7 (ref 5.0–8.0)

## 2013-03-31 LAB — HEPATIC FUNCTION PANEL
ALT: 34 U/L (ref 0–35)
AST: 23 U/L (ref 0–37)
Albumin: 3.9 g/dL (ref 3.5–5.2)
Alkaline Phosphatase: 168 U/L — ABNORMAL HIGH (ref 39–117)
BILIRUBIN DIRECT: 0.1 mg/dL (ref 0.0–0.3)
BILIRUBIN INDIRECT: 0.4 mg/dL (ref 0.2–1.2)
Total Bilirubin: 0.5 mg/dL (ref 0.2–1.2)
Total Protein: 6.9 g/dL (ref 6.0–8.3)

## 2013-03-31 LAB — MICROALBUMIN / CREATININE URINE RATIO
Creatinine, Urine: 154.5 mg/dL
Microalb Creat Ratio: 15.1 mg/g (ref 0.0–30.0)
Microalb, Ur: 2.33 mg/dL — ABNORMAL HIGH (ref 0.00–1.89)

## 2013-03-31 LAB — BASIC METABOLIC PANEL WITH GFR
BUN: 18 mg/dL (ref 6–23)
CALCIUM: 9.7 mg/dL (ref 8.4–10.5)
CO2: 24 mEq/L (ref 19–32)
CREATININE: 0.93 mg/dL (ref 0.50–1.10)
Chloride: 101 mEq/L (ref 96–112)
GFR, Est African American: 69 mL/min
GFR, Est Non African American: 59 mL/min — ABNORMAL LOW
Glucose, Bld: 113 mg/dL — ABNORMAL HIGH (ref 70–99)
Potassium: 4.4 mEq/L (ref 3.5–5.3)
Sodium: 138 mEq/L (ref 135–145)

## 2013-03-31 LAB — HEMOGLOBIN A1C
Hgb A1c MFr Bld: 9.1 % — ABNORMAL HIGH (ref <5.7)
Mean Plasma Glucose: 214 mg/dL — ABNORMAL HIGH (ref <117)

## 2013-03-31 LAB — MAGNESIUM: Magnesium: 1.7 mg/dL (ref 1.5–2.5)

## 2013-03-31 LAB — IRON AND TIBC
%SAT: 5 % — AB (ref 20–55)
Iron: 14 ug/dL — ABNORMAL LOW (ref 42–145)
TIBC: 292 ug/dL (ref 250–470)
UIBC: 278 ug/dL (ref 125–400)

## 2013-03-31 LAB — INSULIN, FASTING: INSULIN FASTING, SERUM: 6 u[IU]/mL (ref 3–28)

## 2013-03-31 LAB — TSH: TSH: 2.522 u[IU]/mL (ref 0.350–4.500)

## 2013-03-31 LAB — VITAMIN D 25 HYDROXY (VIT D DEFICIENCY, FRACTURES): Vit D, 25-Hydroxy: 112 ng/mL — ABNORMAL HIGH (ref 30–89)

## 2013-04-01 ENCOUNTER — Telehealth: Payer: Self-pay

## 2013-04-01 ENCOUNTER — Other Ambulatory Visit: Payer: Self-pay | Admitting: Emergency Medicine

## 2013-04-01 DIAGNOSIS — D649 Anemia, unspecified: Secondary | ICD-10-CM

## 2013-04-01 NOTE — Telephone Encounter (Signed)
Message copied by Nadyne Coombes on Thu Apr 01, 2013 10:02 AM ------      Message from: Kelby Aline R      Created: Thu Apr 01, 2013  5:35 AM       HDL (good cholesterol) is low need to increase cardiovascular exercise to 30 minutes daily. Cholesterol is not in range, need to eat healthy diet, exercise daily, and lose weight. A1c (3 month blood sugar average) elevated need decreased Carbohydrates, increase Cardio, and maintain healthy weight. BUT HAS improved a little, still horribly high which may cause malbu elevation. Increase H2o and try samples given, call with results. Liver enzymes elevated could be from virus/ infection, Alcohol, pain meds, or cholesterol build up. Really need to decrease all of the previously mentioned.Vit D high can decrease dose by 1/2. Magnesium low but with diarrhea need to switch to chelated.  IROn and HGB low need GI eval ASAP.Increase greens and add slow FE.  Need  Recheck HFp/ Bmp/CBC OV 2 weeks       ------

## 2013-04-01 NOTE — Telephone Encounter (Signed)
lmom to pt to return my call for lab results & appt to see Dr.Kaplan 04-09-13 @ 2:15pm.

## 2013-04-02 LAB — TB SKIN TEST
Induration: 0 mm
TB Skin Test: NEGATIVE

## 2013-04-09 ENCOUNTER — Encounter: Payer: Self-pay | Admitting: Gastroenterology

## 2013-04-09 ENCOUNTER — Ambulatory Visit (INDEPENDENT_AMBULATORY_CARE_PROVIDER_SITE_OTHER): Payer: Medicare Other | Admitting: Gastroenterology

## 2013-04-09 VITALS — BP 134/72 | HR 113 | Ht 65.5 in | Wt 163.0 lb

## 2013-04-09 DIAGNOSIS — D649 Anemia, unspecified: Secondary | ICD-10-CM

## 2013-04-09 NOTE — Assessment & Plan Note (Signed)
Workup including endoscopy and colonoscopy were not diagnostic for a GI bleeding source.  She did test Hemoccult positive on one occasion.  I suspect that she may have AVMs, perhaps in her small intestine.  Recommendations #1 capsule endoscopy #2 continue iron supplementation

## 2013-04-09 NOTE — Progress Notes (Signed)
          History of Present Illness:  The patient has returned following upper and lower endoscopy.  These exams were performed in November.  The former demonstrated a superficial linear gastric ulcer.  The latter demonstrated diverticulosis only.  She tested Hemoccult positive in November.  Subsequent Hemoccults were negative.  About 10 days ago iron studies were pertinent for serum iron 14 and percent saturation 5.  Hemoglobin was 9.6 and MCV 74.9.  Patient has had no overt GI bleeding.  Stools are dark from iron.  In the past week she's been feeling quite well with increased energy and has no particular complaints.    Review of Systems: Pertinent positive and negative review of systems were noted in the above HPI section. All other review of systems were otherwise negative.    Current Medications, Allergies, Past Medical History, Past Surgical History, Family History and Social History were reviewed in [place] record  Vital signs were reviewed in today's medical record. Physical Exam: General: Well developed , well nourished, no acute distress Skin: anicteric Head: Normocephalic and atraumatic Eyes:  sclerae anicteric, EOMI Ears: Normal auditory acuity Mouth: No deformity or lesions Lungs: Clear throughout to auscultation Heart: Regular rate and rhythm; no murmurs, rubs or bruits Abdomen: Soft, non tender and non distended. No masses, hepatosplenomegaly or hernias noted. Normal Bowel sounds Rectal:deferred Musculoskeletal: Symmetrical with no gross deformities  Pulses:  Normal pulses noted Extremities: No clubbing, cyanosis, edema or deformities noted Neurological: Alert oriented x 4, grossly nonfocal Psychological:  Alert and cooperative. Normal mood and affect  See Assessment and Plan under Problem List

## 2013-04-09 NOTE — Patient Instructions (Signed)
Jamie Mendez will be speaking with you about your capsule endoscopy today

## 2013-04-15 ENCOUNTER — Encounter: Payer: Self-pay | Admitting: Emergency Medicine

## 2013-04-15 ENCOUNTER — Ambulatory Visit (INDEPENDENT_AMBULATORY_CARE_PROVIDER_SITE_OTHER): Payer: Medicare Other | Admitting: Emergency Medicine

## 2013-04-15 VITALS — BP 148/66 | HR 98 | Temp 98.0°F | Resp 16 | Ht 66.0 in | Wt 160.0 lb

## 2013-04-15 DIAGNOSIS — D649 Anemia, unspecified: Secondary | ICD-10-CM

## 2013-04-15 DIAGNOSIS — R5383 Other fatigue: Secondary | ICD-10-CM

## 2013-04-15 DIAGNOSIS — R5381 Other malaise: Secondary | ICD-10-CM

## 2013-04-15 DIAGNOSIS — Z79899 Other long term (current) drug therapy: Secondary | ICD-10-CM

## 2013-04-15 LAB — HEPATIC FUNCTION PANEL
ALBUMIN: 3.7 g/dL (ref 3.5–5.2)
ALT: 20 U/L (ref 0–35)
AST: 23 U/L (ref 0–37)
Alkaline Phosphatase: 128 U/L — ABNORMAL HIGH (ref 39–117)
Bilirubin, Direct: 0.2 mg/dL (ref 0.0–0.3)
Indirect Bilirubin: 0.5 mg/dL (ref 0.2–1.2)
TOTAL PROTEIN: 7 g/dL (ref 6.0–8.3)
Total Bilirubin: 0.7 mg/dL (ref 0.2–1.2)

## 2013-04-15 LAB — CBC WITH DIFFERENTIAL/PLATELET
BASOS ABS: 0 10*3/uL (ref 0.0–0.1)
BASOS PCT: 1 % (ref 0–1)
Eosinophils Absolute: 0 10*3/uL (ref 0.0–0.7)
Eosinophils Relative: 1 % (ref 0–5)
HEMATOCRIT: 28.1 % — AB (ref 36.0–46.0)
Hemoglobin: 9.2 g/dL — ABNORMAL LOW (ref 12.0–15.0)
LYMPHS PCT: 19 % (ref 12–46)
Lymphs Abs: 0.8 10*3/uL (ref 0.7–4.0)
MCH: 24 pg — ABNORMAL LOW (ref 26.0–34.0)
MCHC: 32.7 g/dL (ref 30.0–36.0)
MCV: 73.4 fL — ABNORMAL LOW (ref 78.0–100.0)
MONO ABS: 0.3 10*3/uL (ref 0.1–1.0)
Monocytes Relative: 8 % (ref 3–12)
NEUTROS ABS: 2.9 10*3/uL (ref 1.7–7.7)
NEUTROS PCT: 71 % (ref 43–77)
Platelets: 260 10*3/uL (ref 150–400)
RBC: 3.83 MIL/uL — ABNORMAL LOW (ref 3.87–5.11)
RDW: 16.1 % — ABNORMAL HIGH (ref 11.5–15.5)
WBC: 4.1 10*3/uL (ref 4.0–10.5)

## 2013-04-15 LAB — BASIC METABOLIC PANEL WITH GFR
BUN: 26 mg/dL — AB (ref 6–23)
CHLORIDE: 98 meq/L (ref 96–112)
CO2: 26 mEq/L (ref 19–32)
Calcium: 9.6 mg/dL (ref 8.4–10.5)
Creat: 1.28 mg/dL — ABNORMAL HIGH (ref 0.50–1.10)
GFR, EST AFRICAN AMERICAN: 47 mL/min — AB
GFR, EST NON AFRICAN AMERICAN: 40 mL/min — AB
Glucose, Bld: 231 mg/dL — ABNORMAL HIGH (ref 70–99)
Potassium: 4.5 mEq/L (ref 3.5–5.3)
Sodium: 134 mEq/L — ABNORMAL LOW (ref 135–145)

## 2013-04-15 LAB — IRON AND TIBC
%SAT: 4 % — ABNORMAL LOW (ref 20–55)
IRON: 12 ug/dL — AB (ref 42–145)
TIBC: 286 ug/dL (ref 250–470)
UIBC: 274 ug/dL (ref 125–400)

## 2013-04-15 LAB — MAGNESIUM: MAGNESIUM: 2 mg/dL (ref 1.5–2.5)

## 2013-04-15 MED ORDER — GLUCOSE BLOOD VI STRP: ORAL_STRIP | Status: AC

## 2013-04-15 NOTE — Patient Instructions (Signed)

## 2013-04-15 NOTE — Progress Notes (Signed)
Subjective:    Patient ID: Jamie Mendez, female    DOB: 1933/10/21, 80 y.o.   MRN: [id number]  HPI Comments: 80 yo female with f/u with anemia. She has capsule endo 04/19/13 and she has improved diet/ exercise and added Vits AD. She has noted improved energy since last OV with changes.  Last labs WBC      5.1   03/30/2013 HGB      9.6   03/30/2013 HCT     28.6   03/30/2013 MCV     74.9   03/30/2013 PLT      254   03/30/2013 ALT           34   03/30/2013 AST           23   03/30/2013 ALKPHOS      168   03/30/2013 BILITOT      0.5   03/30/2013 CREATININE     0.93   03/30/2013 BUN              18   03/30/2013 NA              138   03/30/2013 K               4.4   03/30/2013 CL              101   03/30/2013 CO2              24   03/30/2013 Iron 5  Current Outpatient Prescriptions on File Prior to Visit  Medication Sig Dispense Refill  . Calcium Carbonate-Vit D-Min (CALCIUM 1200 PO) Take 1 tablet by mouth daily.      . cholecalciferol (VITAMIN D) 1000 UNITS tablet Take 1,000 Units by mouth. Three times per week      . Cinnamon 500 MG capsule Take 500 mg by mouth daily.      . enalapril (VASOTEC) 20 MG tablet Take 20 mg by mouth daily.       . ferrous sulfate 325 (65 FE) MG tablet Take 325 mg by mouth daily with breakfast.      . fish oil-omega-3 fatty acids 1000 MG capsule Take 2 g by mouth daily.      Marland Kitchen glimepiride (AMARYL) 4 MG tablet Take 4 mg by mouth daily with breakfast.       . Magnesium 500 MG CAPS Take 1 capsule by mouth daily.      . Multiple Vitamins-Minerals (CENTRUM CARDIO) TABS Take 1 tablet by mouth daily.      . Red Yeast Rice 600 MG TABS Take 1 tablet by mouth daily.       No current facility-administered medications on file prior to visit.   Allergies  Allergen Reactions  . Aspirin Other (See Comments)    Nose bleeds  . Capoten [Captopril] Other (See Comments)    Mouth numbness   . Levemir [Insulin Detemir] Other (See Comments)    Decreased blood sugar  . Micronase [Glyburide]   .  Onglyza [Saxagliptin] Other (See Comments)    Elevates blood sugar  . Statins     Numbness in face  . Zocor [Simvastatin] Other (See Comments)    Extreme fatigue   Past Medical History  Diagnosis Date  . Diabetes   . High blood pressure   . Hyperlipidemia   . Neuropathy       Review of Systems  All other systems reviewed and are negative.   BP 148/66  Pulse 98  Temp(Src) 98 F (36.7 C) (Temporal)  Resp 16  Ht 5\' 6"  (1.676 m)  Wt 160 lb (72.576 kg)  BMI 25.84 kg/m2     Objective:   Physical Exam  Nursing note and vitals reviewed. Constitutional: She is oriented to person, place, and time. She appears well-developed and well-nourished. No distress.  HENT:  Head: Normocephalic and atraumatic.  Right Ear: External ear normal.  Left Ear: External ear normal.  Nose: Nose normal.  Mouth/Throat: Oropharynx is clear and moist.  Eyes: Conjunctivae and EOM are normal.  Neck: Normal range of motion. Neck supple. No JVD present. No thyromegaly present.  Cardiovascular: Normal rate, regular rhythm, normal heart sounds and intact distal pulses.   Pulmonary/Chest: Effort normal and breath sounds normal.  Abdominal: Soft. Bowel sounds are normal. She exhibits no distension and no mass. There is no tenderness. There is no rebound and no guarding.  Musculoskeletal: Normal range of motion. She exhibits no edema and no tenderness.  Lymphadenopathy:    She has no cervical adenopathy.  Neurological: She is alert and oriented to person, place, and time. No cranial nerve deficit.  Skin: Skin is warm and dry. No rash noted. No erythema. No pallor.  Scaling feet  Psychiatric: She has a normal mood and affect. Her behavior is normal. Judgment and thought content normal.     Diabetic foot exam performed Fall RIsk Screen- Low Depression Screen -Neg      Assessment & Plan:  1. Anemia- Recheck labs, take Vitamins AD increase green leafy veggies, increase H2O, w/c if any symptom  increase.  2. Fatigue- check labs, increase activity and H2O  3. HTN- Check BP call if >130/80, increase cardio  4. DM- SX Xigduo 05/998 mg given AD.

## 2013-04-19 ENCOUNTER — Other Ambulatory Visit: Payer: Self-pay | Admitting: Internal Medicine

## 2013-04-20 ENCOUNTER — Ambulatory Visit (INDEPENDENT_AMBULATORY_CARE_PROVIDER_SITE_OTHER): Payer: Medicare Other | Admitting: Gastroenterology

## 2013-04-20 DIAGNOSIS — D500 Iron deficiency anemia secondary to blood loss (chronic): Secondary | ICD-10-CM

## 2013-04-20 NOTE — Progress Notes (Signed)
Pt here for capsule endoscopy. Pt states she completed the prep without problems.   Lot #[id number]         10 03/2014  Pt tolerated procedure well.

## 2013-05-04 ENCOUNTER — Other Ambulatory Visit: Payer: Self-pay | Admitting: Physician Assistant

## 2013-05-13 ENCOUNTER — Encounter: Payer: Self-pay | Admitting: Gastroenterology

## 2013-05-18 ENCOUNTER — Ambulatory Visit (INDEPENDENT_AMBULATORY_CARE_PROVIDER_SITE_OTHER): Payer: Medicare Other | Admitting: Internal Medicine

## 2013-05-18 ENCOUNTER — Encounter: Payer: Self-pay | Admitting: Internal Medicine

## 2013-05-18 VITALS — BP 124/62 | HR 68 | Temp 97.7°F | Resp 16 | Ht 65.25 in | Wt 157.2 lb

## 2013-05-18 DIAGNOSIS — I10 Essential (primary) hypertension: Secondary | ICD-10-CM

## 2013-05-18 DIAGNOSIS — E1129 Type 2 diabetes mellitus with other diabetic kidney complication: Secondary | ICD-10-CM

## 2013-05-18 DIAGNOSIS — D649 Anemia, unspecified: Secondary | ICD-10-CM

## 2013-05-18 DIAGNOSIS — E782 Mixed hyperlipidemia: Secondary | ICD-10-CM

## 2013-05-18 DIAGNOSIS — E559 Vitamin D deficiency, unspecified: Secondary | ICD-10-CM

## 2013-05-18 DIAGNOSIS — Z79899 Other long term (current) drug therapy: Secondary | ICD-10-CM

## 2013-05-18 LAB — CBC WITH DIFFERENTIAL/PLATELET
Basophils Absolute: 0 10*3/uL (ref 0.0–0.1)
Basophils Relative: 1 % (ref 0–1)
EOS ABS: 0.1 10*3/uL (ref 0.0–0.7)
Eosinophils Relative: 2 % (ref 0–5)
HEMATOCRIT: 27.9 % — AB (ref 36.0–46.0)
HEMOGLOBIN: 8.9 g/dL — AB (ref 12.0–15.0)
Lymphocytes Relative: 23 % (ref 12–46)
Lymphs Abs: 0.9 10*3/uL (ref 0.7–4.0)
MCH: 23 pg — AB (ref 26.0–34.0)
MCHC: 31.9 g/dL (ref 30.0–36.0)
MCV: 72.1 fL — ABNORMAL LOW (ref 78.0–100.0)
MONOS PCT: 7 % (ref 3–12)
Monocytes Absolute: 0.3 10*3/uL (ref 0.1–1.0)
Neutro Abs: 2.5 10*3/uL (ref 1.7–7.7)
Neutrophils Relative %: 67 % (ref 43–77)
Platelets: 255 10*3/uL (ref 150–400)
RBC: 3.87 MIL/uL (ref 3.87–5.11)
RDW: 16.9 % — ABNORMAL HIGH (ref 11.5–15.5)
WBC: 3.8 10*3/uL — ABNORMAL LOW (ref 4.0–10.5)

## 2013-05-18 NOTE — Progress Notes (Signed)
Patient ID: Jamie Mendez, female   DOB: Jun 08, 1933, 80 y.o.   MRN: [id number]    This very nice 80 y.o. female presents for 3 month follow up with Hypertension, Hyperlipidemia, Pre-Diabetes, Vitamin D Deficiency and Iron Deficiency Anemia. Since last OV patient had capsule endoscopy with results still pending(Had neg EGD & Colon) searching for source of iron loss as documented at last OV. As noted at last OV patient had a significant drop in GFR and worsening anemia which has been aSSx other than fatigue.   HTN predates since 16. BP has been controlled with today's BP: 124/62 mmHg. Patient denies any cardiac type chest pain, palpitations, dyspnea/orthopnea/PND, dizziness, claudication, or dependent edema.   Hyperlipidemia is controlled with diet & meds. Last Cholesterol was  154, Triglycerides were 89, HDL 35 and LDL 101 in March at goal. Patient denies myalgias or other med SE's.    Also, the patient has history of T2 NIDDM since 1980 w/Stage 3 CKD (GFR 40 ml/min) and  last A1c was 9.1% in March 2015. Patient denies any symptoms of reactive hypoglycemia, diabetic polys, paresthesias or visual blurring.   Further, Patient has history of Vitamin D Deficiency with last vitamin D of 114 in August and dose was tapered. Patient supplements vitamin D without any suspected side-effects.  Medication Sig  . Calcium Carbonate-Vit D-Min (CALCIUM 1200) Take 1 tablet by mouth daily.  . cholecalciferol (VITAMIN D) 1000 UNITS tablet Take 1,000 Units by mouth. Three times per week  . Cinnamon 500 MG capsule Take 500 mg by mouth daily.  . Dapagliflozin-Metformin  (XIGDUO XR) 05-998 MG Take 1 tablet by mouth daily.  . enalapril (VASOTEC) 20 MG tablet TAKE ONE TABLET BY MOUTH ONCE DAILY FOR BLOOD PRESSURE  . ferrous sulfate 325 (65 FE) MG tablet Take 325 mg by mouth daily with breakfast.  . fish oil-omega-3 fatty acids 1000 MG capsule Take 2 g by mouth daily.  Marland Kitchen glimepiride (AMARYL) 4 MG tablet TAKE ONE  TABLET BY MOUTH TWICE DAILY  . glucose blood test strip Use as instructed  . Magnesium 500 MG CAPS Take 1 capsule by mouth daily.  . Multiple Vitamins-Minerals (CENTRUM CARDIO)  Take 1 tablet by mouth daily.  . Red Yeast Rice 600 MG TABS Take 1 tablet by mouth daily.      Allergies  Allergen Reactions  . Aspirin Other (See Comments)    Nose bleeds  . Capoten [Captopril] Other (See Comments)    Mouth numbness   . Levemir [Insulin Detemir] Other (See Comments)    Decreased blood sugar  . Micronase [Glyburide]   . Onglyza [Saxagliptin] Other (See Comments)    Elevates blood sugar  . Statins     Numbness in face  . Zocor [Simvastatin] Other (See Comments)    Extreme fatigue    PMHx:   Past Medical History  Diagnosis Date  . Diabetes   . High blood pressure   . Hyperlipidemia   . Neuropathy     FHx:    Reviewed / unchanged  SHx:    Reviewed / unchanged   Systems Review: Constitutional: Denies fever, chills, wt changes, headaches, insomnia, fatigue, night sweats, change in appetite. Eyes: Denies redness, blurred vision, diplopia, discharge, itchy, watery eyes.  ENT: Denies discharge, congestion, post nasal drip, epistaxis, sore throat, earache, hearing loss, dental pain, tinnitus, vertigo, sinus pain, snoring.  CV: Denies chest pain, palpitations, irregular heartbeat, syncope, dyspnea, diaphoresis, orthopnea, PND, claudication, edema. Respiratory: denies cough, dyspnea, DOE, pleurisy, hoarseness,  laryngitis, wheezing.  Gastrointestinal: Denies dysphagia, odynophagia, heartburn, reflux, water brash, abdominal pain or cramps, nausea, vomiting, bloating, diarrhea, constipation, hematemesis, melena, hematochezia,  or hemorrhoids. Genitourinary: Denies dysuria, frequency, urgency, nocturia, hesitancy, discharge, hematuria, flank pain. Musculoskeletal: Denies arthralgias, myalgias, stiffness, jt. swelling, pain, limp, strain/sprain.  Skin: Denies pruritus, rash, hives, warts, acne,  eczema, change in skin lesion(s). Neuro: No weakness, tremor, incoordination, spasms, paresthesia, or pain. Psychiatric: Denies confusion, memory loss, or sensory loss. Endo: Denies change in weight, skin, hair change.  Heme/Lymph: No excessive bleeding, bruising, orenlarged lymph nodes.   Exam:  BP 124/62  Pulse 68  Temp(Src) 97.7 F (36.5 C) (Temporal)  Resp 16  Ht 5' 5.25" (1.657 m)  Wt 157 lb 3.2 oz (71.305 kg)  BMI 25.97 kg/m2  Appears well nourished - in no distress. Eyes: PERRLA, EOMs, conjunctiva no swelling or erythema. ENT/Mouth: EAC's clear, TM's nl w/o erythema, bulging. Nares clear w/o erythema, swelling, exudates. Oropharynx clear without erythema or exudates. Oral hygiene is good. Tongue normal, non obstructing. Hearing intact.  Neck: Supple. Thyroid nl. Car 2+/2+ without bruits, nodes or JVD. Chest: Respirations nl with BS clear & equal w/o rales, rhonchi, wheezing or stridor.  Cor: Heart sounds normal w/ regular rate and rhythm without sig. murmurs, gallops, clicks, or rubs. Peripheral pulses normal and equal  without edema.  Abdomen: Soft & bowel sounds normal. Non-tender w/o guarding, rebound, hernias, masses, or organomegaly.  Musculoskeletal: Full ROM all peripheral extremities, joint stability, 5/5 strength, and normal gait.  Skin: Warm, dry without exposed rashes, lesions, ecchymosis apparent.  Neuro: Cranial nerves intact, reflexes equal bilaterally. Sensory-motor testing grossly intact. Tendon reflexes grossly intact.  Pysch: Alert & oriented x 3. Insight and judgement nl & appropriate. No ideations.  Assessment and Plan:  1. Hypertension - Continue monitor blood pressure at home. Continue diet/meds same.  2. Hyperlipidemia - Continue diet/meds, exercise,& lifestyle modifications. Continue monitor periodic cholesterol/liver & renal functions   3. T2 NIDDM w/ Stage 3 CKD - Not compliant nor well controlled & recommended better diet, weight loss, regular  exercise, diabetic monitoring and periodic eye exams.  4. Vitamin D Deficiency - Continue supplementation.  5. Fe Deficiency - with neg GI w/u the source is most likely decreased iron absorption from Metformin.   Recommended regular exercise, BP monitoring, weight control, and discussed med and SE's. Recommended labs to assess and monitor clinical status. Further disposition pending results of labs.

## 2013-05-18 NOTE — Patient Instructions (Signed)

## 2013-05-19 LAB — LIPID PANEL
CHOL/HDL RATIO: 4.7 ratio
CHOLESTEROL: 165 mg/dL (ref 0–200)
HDL: 35 mg/dL — ABNORMAL LOW (ref >39)
LDL Cholesterol: 113 mg/dL — ABNORMAL HIGH (ref 0–99)
TRIGLYCERIDES: 84 mg/dL (ref <150)
VLDL: 17 mg/dL (ref 0–40)

## 2013-05-19 LAB — IRON AND TIBC
%SAT: 6 % — AB (ref 20–55)
IRON: 16 ug/dL — AB (ref 42–145)
TIBC: 269 ug/dL (ref 250–470)
UIBC: 253 ug/dL (ref 125–400)

## 2013-05-19 LAB — BASIC METABOLIC PANEL WITH GFR
BUN: 21 mg/dL (ref 6–23)
CALCIUM: 9.8 mg/dL (ref 8.4–10.5)
CO2: 25 meq/L (ref 19–32)
Chloride: 99 mEq/L (ref 96–112)
Creat: 1.12 mg/dL — ABNORMAL HIGH (ref 0.50–1.10)
GFR, Est African American: 55 mL/min — ABNORMAL LOW
GFR, Est Non African American: 47 mL/min — ABNORMAL LOW
GLUCOSE: 214 mg/dL — AB (ref 70–99)
POTASSIUM: 4.3 meq/L (ref 3.5–5.3)
Sodium: 138 mEq/L (ref 135–145)

## 2013-05-19 LAB — TSH: TSH: 1.617 u[IU]/mL (ref 0.350–4.500)

## 2013-05-19 LAB — INSULIN, FASTING: Insulin fasting, serum: 8 u[IU]/mL (ref 3–28)

## 2013-05-19 LAB — VITAMIN D 25 HYDROXY (VIT D DEFICIENCY, FRACTURES): Vit D, 25-Hydroxy: 97 ng/mL — ABNORMAL HIGH (ref 30–89)

## 2013-05-19 LAB — MAGNESIUM: Magnesium: 2.1 mg/dL (ref 1.5–2.5)

## 2013-05-19 LAB — HEMOGLOBIN A1C
Hgb A1c MFr Bld: 8.6 % — ABNORMAL HIGH (ref <5.7)
Mean Plasma Glucose: 200 mg/dL — ABNORMAL HIGH (ref <117)

## 2013-07-14 ENCOUNTER — Telehealth: Payer: Self-pay | Admitting: Hematology and Oncology

## 2013-07-14 NOTE — Telephone Encounter (Signed)
LEFT MESSAGE AND GAVE NP APPT FOR 06/26 @ 11:15 W/DR. [place].

## 2013-07-14 NOTE — Telephone Encounter (Signed)
PATIENT CALLED TO CONFIRMED NP APPT FOR 06/26 @ 11:15 W/DR. [place]

## 2013-07-23 ENCOUNTER — Ambulatory Visit (HOSPITAL_BASED_OUTPATIENT_CLINIC_OR_DEPARTMENT_OTHER): Payer: Medicare Other | Admitting: Hematology and Oncology

## 2013-07-23 ENCOUNTER — Telehealth: Payer: Self-pay | Admitting: Hematology and Oncology

## 2013-07-23 ENCOUNTER — Ambulatory Visit (HOSPITAL_BASED_OUTPATIENT_CLINIC_OR_DEPARTMENT_OTHER): Payer: Medicare Other

## 2013-07-23 ENCOUNTER — Encounter: Payer: Self-pay | Admitting: Hematology and Oncology

## 2013-07-23 VITALS — BP 141/55 | HR 88 | Temp 98.1°F | Resp 19 | Ht 65.25 in | Wt 152.7 lb

## 2013-07-23 DIAGNOSIS — R161 Splenomegaly, not elsewhere classified: Secondary | ICD-10-CM

## 2013-07-23 DIAGNOSIS — R634 Abnormal weight loss: Secondary | ICD-10-CM

## 2013-07-23 DIAGNOSIS — D500 Iron deficiency anemia secondary to blood loss (chronic): Secondary | ICD-10-CM

## 2013-07-23 HISTORY — DX: Splenomegaly, not elsewhere classified: R16.1

## 2013-07-23 HISTORY — DX: Abnormal weight loss: R63.4

## 2013-07-23 NOTE — Progress Notes (Signed)
[facility] NOTE  Patient Care Team: Aretta Nip, MD as PCP - General (Family Medicine) Fabio Pierce, MD as Consulting Physician (Ophthalmology) Inda Castle, MD as Consulting Physician (Gastroenterology) Cammie Sickle, MD as Consulting Physician (Orthopedic Surgery) Jannette Spanner, MD as Referring Physician (Dermatology)  CHIEF COMPLAINTS/PURPOSE OF CONSULTATION:  Severe iron deficiency anemia  HISTORY OF PRESENTING ILLNESS:  Jamie Mendez 80 y.o. female is here because of severe iron deficiency anemia  She was found to have abnormal CBC from routine blood work. Review of blood work since October 2014 show persistent anemia with progression with microcytosis. EGD and colonoscopy done in 2014 show erosive gastritis/esophagitis. She was placed on iron supplement 3 times a day with meals with no improvement. She denies recent chest pain on exertion, shortness of breath on minimal exertion, pre-syncopal episodes, or palpitations. She does complain of feeling weak and occasionally lightheaded. She had not noticed any recent bleeding such as epistaxis, hematuria or hematochezia The patient denies over the counter NSAID ingestion. She is not on antiplatelets agents. She had no prior history or diagnosis of cancer. Her age appropriate screening programs are up-to-date. She denies any pica and eats a variety of diet. She never donated blood or received blood transfusion The patient was prescribed oral iron supplements and she takes 3 times a day with food. The patient has 40 pound weight loss over the past year due to anorexia.  MEDICAL HISTORY:  Past Medical History  Diagnosis Date  . Diabetes   . High blood pressure   . Hyperlipidemia   . Neuropathy   . Anemia   . Weight loss 07/23/2013  . Splenomegaly 07/23/2013    SURGICAL HISTORY: Past Surgical History  Procedure Laterality Date  . Colonoscopy    . Polypectomy  1989  . Breast surgery  Right     biopsy-benign  . Esophagogastroduodenoscopy      SOCIAL HISTORY: History   Social History  . Marital Status: Widowed    Spouse Name: N/A    Number of Children: N/A  . Years of Education: N/A   Occupational History  . Not on file.   Social History Main Topics  . Smoking status: Never Smoker   . Smokeless tobacco: Never Used  . Alcohol Use: No  . Drug Use: No  . Sexual Activity: Not on file   Other Topics Concern  . Not on file   Social History Narrative  . No narrative on file    FAMILY HISTORY: Family History  Problem Relation Age of Onset  . Cancer Mother     bladder  . Colon cancer Neg Hx     ALLERGIES:  is allergic to aspirin; capoten; levemir; micronase; onglyza; statins; and zocor.  MEDICATIONS:  Current Outpatient Prescriptions  Medication Sig Dispense Refill  . bisoprolol-hydrochlorothiazide (ZIAC) 5-6.25 MG per tablet       . Calcium Carbonate-Vit D-Min (CALCIUM 1200 PO) Take 1 tablet by mouth daily.      . cholecalciferol (VITAMIN D) 1000 UNITS tablet Take 1,000 Units by mouth. Three times per week      . Cinnamon 500 MG capsule Take 500 mg by mouth daily.      . Dapagliflozin-Metformin HCl ER (XIGDUO XR) 05-998 MG TB24 Take 1 tablet by mouth daily.      . enalapril (VASOTEC) 20 MG tablet TAKE ONE TABLET BY MOUTH ONCE DAILY FOR BLOOD PRESSURE  90 tablet  0  . ferrous sulfate 325 (  65 FE) MG tablet Take 325 mg by mouth daily with breakfast.      . fish oil-omega-3 fatty acids 1000 MG capsule Take 2 g by mouth daily.      Marland Kitchen glimepiride (AMARYL) 4 MG tablet TAKE ONE TABLET BY MOUTH TWICE DAILY  180 tablet  0  . glucose blood test strip Use as instructed  100 each  12  . Magnesium 500 MG CAPS Take 1 capsule by mouth daily.      . Multiple Vitamins-Minerals (CENTRUM CARDIO) TABS Take 1 tablet by mouth daily.      . Red Yeast Rice 600 MG TABS Take 1 tablet by mouth daily.       No current facility-administered medications for this visit.     REVIEW OF SYSTEMS:   Constitutional: Denies fevers, chills or abnormal night sweats Eyes: Denies blurriness of vision, double vision or watery eyes Ears, nose, mouth, throat, and face: Denies mucositis or sore throat Respiratory: Denies cough, dyspnea or wheezes Cardiovascular: Denies palpitation, chest discomfort or lower extremity swelling Gastrointestinal:  Denies nausea, heartburn or change in bowel habits Skin: Denies abnormal skin rashes Lymphatics: Denies new lymphadenopathy or easy bruising Neurological:Denies numbness, tingling or new weaknesses Behavioral/Psych: Mood is stable, no new changes  All other systems were reviewed with the patient and are negative.  PHYSICAL EXAMINATION: ECOG PERFORMANCE STATUS: 1 - Symptomatic but completely ambulatory  Filed Vitals:   07/23/13 1130  BP: 141/55  Pulse: 88  Temp: 98.1 F (36.7 C)  Resp: 19   Filed Weights   07/23/13 1130  Weight: 152 lb 11.2 oz (69.264 kg)    GENERAL:alert, no distress and comfortable. She looks thin SKIN: skin color, texture, turgor are normal, no rashes or significant lesions EYES: normal, conjunctiva are pale and non-injected, sclera clear OROPHARYNX:no exudate, no erythema and lips, buccal mucosa, and tongue normal  NECK: supple, thyroid normal size, non-tender, without nodularity LYMPH:  no palpable lymphadenopathy in the cervical, axillary or inguinal LUNGS: clear to auscultation and percussion with normal breathing effort HEART: regular rate & rhythm and no murmurs and no lower extremity edema ABDOMEN:abdomen soft, non-tender and normal bowel sounds. There is palpable splenomegaly/mass in the left upper quadrant on deep inspiration. Musculoskeletal:no cyanosis of digits and no clubbing  PSYCH: alert & oriented x 3 with fluent speech NEURO: no focal motor/sensory deficits  LABORATORY DATA:  I have reviewed the data as listed Recent Results (from the past 2160 hour(s))  CBC WITH  DIFFERENTIAL     Status: Abnormal   Collection Time    05/18/13 12:56 PM      Result Value Ref Range   WBC 3.8 (*) 4.0 - 10.5 K/uL   RBC 3.87  3.87 - 5.11 MIL/uL   Hemoglobin 8.9 (*) 12.0 - 15.0 g/dL   HCT 27.9 (*) 36.0 - 46.0 %   MCV 72.1 (*) 78.0 - 100.0 fL   MCH 23.0 (*) 26.0 - 34.0 pg   MCHC 31.9  30.0 - 36.0 g/dL   RDW 16.9 (*) 11.5 - 15.5 %   Platelets 255  150 - 400 K/uL   Neutrophils Relative % 67  43 - 77 %   Neutro Abs 2.5  1.7 - 7.7 K/uL   Lymphocytes Relative 23  12 - 46 %   Lymphs Abs 0.9  0.7 - 4.0 K/uL   Monocytes Relative 7  3 - 12 %   Monocytes Absolute 0.3  0.1 - 1.0 K/uL   Eosinophils Relative 2  0 - 5 %   Eosinophils Absolute 0.1  0.0 - 0.7 K/uL   Basophils Relative 1  0 - 1 %   Basophils Absolute 0.0  0.0 - 0.1 K/uL   Smear Review Criteria for review not met    BASIC METABOLIC PANEL WITH GFR     Status: Abnormal   Collection Time    05/18/13 12:56 PM      Result Value Ref Range   Sodium 138  135 - 145 mEq/L   Potassium 4.3  3.5 - 5.3 mEq/L   Chloride 99  96 - 112 mEq/L   CO2 25  19 - 32 mEq/L   Glucose, Bld 214 (*) 70 - 99 mg/dL   BUN 21  6 - 23 mg/dL   Creat 1.12 (*) 0.50 - 1.10 mg/dL   Calcium 9.8  8.4 - 10.5 mg/dL   GFR, Est African American 55 (*)    GFR, Est Non African American 47 (*)    Comment:       The estimated GFR is a calculation valid for adults (>=58 years old)     that uses the CKD-EPI algorithm to adjust for age and sex. It is       not to be used for children, pregnant women, hospitalized patients,        patients on dialysis, or with rapidly changing kidney function.     According to the NKDEP, eGFR >89 is normal, 60-89 shows mild     impairment, 30-59 shows moderate impairment, 15-29 shows severe     impairment and <15 is ESRD.        MAGNESIUM     Status: None   Collection Time    05/18/13 12:56 PM      Result Value Ref Range   Magnesium 2.1  1.5 - 2.5 mg/dL  LIPID PANEL     Status: Abnormal   Collection Time    05/18/13  12:56 PM      Result Value Ref Range   Cholesterol 165  0 - 200 mg/dL   Comment: ATP III Classification:           < 200        mg/dL        Desirable          200 - 239     mg/dL        Borderline High          >= 240        mg/dL        High         Triglycerides 84  <150 mg/dL   HDL 35 (*) >39 mg/dL   Total CHOL/HDL Ratio 4.7     VLDL 17  0 - 40 mg/dL   LDL Cholesterol 113 (*) 0 - 99 mg/dL   Comment:       Total Cholesterol/HDL Ratio:CHD Risk                            Coronary Heart Disease Risk Table                                            Men       Women              1/2 Average Risk  3.4        3.3                  Average Risk              5.0        4.4               2X Average Risk              9.6        7.1               3X Average Risk             23.4       11.0     Use the calculated Patient Ratio above and the CHD Risk table      to determine the patient's CHD Risk.     ATP III Classification (LDL):           < 100        mg/dL         Optimal          100 - 129     mg/dL         Near or Above Optimal          130 - 159     mg/dL         Borderline High          160 - 189     mg/dL         High           > 190        mg/dL         Very High        TSH     Status: None   Collection Time    05/18/13 12:56 PM      Result Value Ref Range   TSH 1.617  0.350 - 4.500 uIU/mL  HEMOGLOBIN A1C     Status: Abnormal   Collection Time    05/18/13 12:56 PM      Result Value Ref Range   Hemoglobin A1C 8.6 (*) <5.7 %   Comment:                                                                            According to the ADA Clinical Practice Recommendations for 2011, when     HbA1c is used as a screening test:             >=6.5%   Diagnostic of Diabetes Mellitus                (if abnormal result is confirmed)           5.7-6.4%   Increased risk of developing Diabetes Mellitus           References:Diagnosis and Classification of Diabetes Mellitus,Diabetes      YHCW,2376,28(BTDVV 1):S62-S69 and Standards of Medical Care in             Diabetes - 2011,Diabetes Care,2011,34 (Suppl 1):S11-S61.         Mean Plasma Glucose 200 (*) <117 mg/dL  INSULIN,  FASTING     Status: None   Collection Time    05/18/13 12:56 PM      Result Value Ref Range   Insulin fasting, serum 8  3 - 28 uIU/mL  VITAMIN D 25 HYDROXY     Status: Abnormal   Collection Time    05/18/13 12:56 PM      Result Value Ref Range   Vit D, 25-Hydroxy 97 (*) 30 - 89 ng/mL   Comment: This assay accurately quantifies Vitamin D, which is the sum of the     25-Hydroxy forms of Vitamin D2 and D3.  Studies have shown that the     optimum concentration of 25-Hydroxy Vitamin D is 30 ng/mL or higher.      Concentrations of Vitamin D between 20 and 29 ng/mL are considered to     be insufficient and concentrations less than 20 ng/mL are considered     to be deficient for Vitamin D.  IRON AND TIBC     Status: Abnormal   Collection Time    05/18/13 12:56 PM      Result Value Ref Range   Iron 16 (*) 42 - 145 ug/dL   UIBC 253  125 - 400 ug/dL   TIBC 269  250 - 470 ug/dL   %SAT 6 (*) 20 - 55 %    ASSESSMENT & PLAN:  Iron deficiency anemia due to chronic blood loss The most likely cause of her anemia is due to chronic blood loss/malabsorption syndrome. She has extensive upper and lower GI evaluation and was found to have gastritis. We discussed some of the risks, benefits, and alternatives of intravenous iron infusions. The patient is symptomatic from anemia and the iron level is critically low. She tolerated oral iron supplement poorly and desires to achieved higher levels of iron faster for adequate hematopoesis. Some of the side-effects to be expected including risks of infusion reactions, phlebitis, headaches, nausea and fatigue.  The patient is willing to proceed. Patient education material was dispensed.  Goal is to keep ferritin level greater than 50 I will check a celiac disease panel to  exclude malabsorption syndrome.  Weight loss EGD and colonoscopy excluded GI malignancy. I felt a palpable mass in the left upper quadrant. I will order a CT scan to exclude cancer.  Splenomegaly Spleen is mildly enlarged. A will order CT scan for evaluation.     All questions were answered. The patient knows to call the clinic with any problems, questions or concerns.    [place], [place], MD 07/23/2013 4:20 PM

## 2013-07-23 NOTE — Progress Notes (Signed)
Checked in new patient with no financial issues at this time. She has not been out of the country and she has her appt card.

## 2013-07-23 NOTE — Telephone Encounter (Signed)
gv adn printed appt sched and avs for pt for July....gv pt barium °

## 2013-07-23 NOTE — Assessment & Plan Note (Signed)
Spleen is mildly enlarged. A will order CT scan for evaluation.

## 2013-07-23 NOTE — Assessment & Plan Note (Signed)
The most likely cause of her anemia is due to chronic blood loss/malabsorption syndrome. She has extensive upper and lower GI evaluation and was found to have gastritis. We discussed some of the risks, benefits, and alternatives of intravenous iron infusions. The patient is symptomatic from anemia and the iron level is critically low. She tolerated oral iron supplement poorly and desires to achieved higher levels of iron faster for adequate hematopoesis. Some of the side-effects to be expected including risks of infusion reactions, phlebitis, headaches, nausea and fatigue.  The patient is willing to proceed. Patient education material was dispensed.  Goal is to keep ferritin level greater than 50 I will check a celiac disease panel to exclude malabsorption syndrome.

## 2013-07-23 NOTE — Assessment & Plan Note (Signed)
EGD and colonoscopy excluded GI malignancy. I felt a palpable mass in the left upper quadrant. I will order a CT scan to exclude cancer.

## 2013-07-28 ENCOUNTER — Ambulatory Visit (HOSPITAL_BASED_OUTPATIENT_CLINIC_OR_DEPARTMENT_OTHER): Payer: Medicare Other

## 2013-07-28 VITALS — BP 166/72 | HR 103 | Temp 98.6°F | Resp 18

## 2013-07-28 DIAGNOSIS — D500 Iron deficiency anemia secondary to blood loss (chronic): Secondary | ICD-10-CM

## 2013-07-28 MED ORDER — SODIUM CHLORIDE 0.9 % IV SOLN
Freq: Once | INTRAVENOUS | Status: AC
  Administered 2013-07-28: 14:00:00 via INTRAVENOUS

## 2013-07-28 MED ORDER — SODIUM CHLORIDE 0.9 % IV SOLN
1020.0000 mg | Freq: Once | INTRAVENOUS | Status: AC
  Administered 2013-07-28: 1020 mg via INTRAVENOUS
  Filled 2013-07-28: qty 34

## 2013-07-28 NOTE — Patient Instructions (Signed)

## 2013-07-29 ENCOUNTER — Ambulatory Visit (HOSPITAL_COMMUNITY)
Admission: RE | Admit: 2013-07-29 | Discharge: 2013-07-29 | Disposition: A | Discharge status: Home / Self Care | Payer: Medicare Other | Source: Ambulatory Visit | Attending: Hematology and Oncology | Admitting: Hematology and Oncology

## 2013-07-29 ENCOUNTER — Other Ambulatory Visit (HOSPITAL_BASED_OUTPATIENT_CLINIC_OR_DEPARTMENT_OTHER): Payer: Medicare Other

## 2013-07-29 ENCOUNTER — Encounter (HOSPITAL_COMMUNITY): Payer: Self-pay

## 2013-07-29 DIAGNOSIS — R599 Enlarged lymph nodes, unspecified: Secondary | ICD-10-CM | POA: Insufficient documentation

## 2013-07-29 DIAGNOSIS — M51379 Other intervertebral disc degeneration, lumbosacral region without mention of lumbar back pain or lower extremity pain: Secondary | ICD-10-CM | POA: Insufficient documentation

## 2013-07-29 DIAGNOSIS — K769 Liver disease, unspecified: Secondary | ICD-10-CM | POA: Insufficient documentation

## 2013-07-29 DIAGNOSIS — N281 Cyst of kidney, acquired: Secondary | ICD-10-CM | POA: Insufficient documentation

## 2013-07-29 DIAGNOSIS — R161 Splenomegaly, not elsewhere classified: Secondary | ICD-10-CM

## 2013-07-29 DIAGNOSIS — D500 Iron deficiency anemia secondary to blood loss (chronic): Secondary | ICD-10-CM

## 2013-07-29 DIAGNOSIS — R634 Abnormal weight loss: Secondary | ICD-10-CM | POA: Insufficient documentation

## 2013-07-29 DIAGNOSIS — K449 Diaphragmatic hernia without obstruction or gangrene: Secondary | ICD-10-CM | POA: Insufficient documentation

## 2013-07-29 DIAGNOSIS — M5137 Other intervertebral disc degeneration, lumbosacral region: Secondary | ICD-10-CM | POA: Insufficient documentation

## 2013-07-29 LAB — BASIC METABOLIC PANEL (CC13)
ANION GAP: 11 meq/L (ref 3–11)
BUN: 17.6 mg/dL (ref 7.0–26.0)
CO2: 24 mEq/L (ref 22–29)
Calcium: 10.1 mg/dL (ref 8.4–10.4)
Chloride: 103 mEq/L (ref 98–109)
Creatinine: 1.1 mg/dL (ref 0.6–1.1)
GLUCOSE: 263 mg/dL — AB (ref 70–140)
POTASSIUM: 5 meq/L (ref 3.5–5.1)
Sodium: 138 mEq/L (ref 136–145)

## 2013-07-29 MED ORDER — IOHEXOL 300 MG/ML  SOLN
100.0000 mL | Freq: Once | INTRAMUSCULAR | Status: AC | PRN
Start: 2013-07-29 — End: 2013-07-29
  Administered 2013-07-29: 100 mL via INTRAVENOUS

## 2013-08-02 ENCOUNTER — Telehealth: Payer: Self-pay | Admitting: *Deleted

## 2013-08-02 ENCOUNTER — Telehealth: Payer: Self-pay | Admitting: Hematology and Oncology

## 2013-08-02 LAB — GLIA (IGA/G) + TTG IGA
GLIADIN IGG: 6.3 U/mL (ref <20)
Gliadin IgA: 5.9 U/mL (ref <20)
TISSUE TRANSGLUTAMINASE AB, IGA: 1.9 U/mL (ref <20)

## 2013-08-02 NOTE — Telephone Encounter (Signed)
Pt left VM states she will not be home this afternoon but she can be reached tomorrow on her home number between 11 am and 2 pm.

## 2013-08-02 NOTE — Telephone Encounter (Signed)
Left VM for pt to return nurse's call.  

## 2013-08-02 NOTE — Telephone Encounter (Signed)
Message copied by Cathlean Cower on Mon Aug 02, 2013  2:06 PM ------      Message from: [facility], [place]      Created: Mon Aug 02, 2013  1:42 PM      Regarding: pls try to get hold of her       CT scan result is abnormal. Need to order more test.      Can you try to get hold of her?      Thanks      ----- Message -----         From: Rad Results In Interface         Sent: 07/29/2013   1:02 PM           To: Heath Lark, MD                   ------

## 2013-08-02 NOTE — Telephone Encounter (Signed)
I am not able to get hold the patient at the telephone number listed. I call her son and left a voicemail to discuss test results.

## 2013-08-03 ENCOUNTER — Other Ambulatory Visit: Payer: Self-pay | Admitting: Hematology and Oncology

## 2013-08-03 ENCOUNTER — Telehealth: Payer: Self-pay | Admitting: Hematology and Oncology

## 2013-08-03 DIAGNOSIS — R161 Splenomegaly, not elsewhere classified: Secondary | ICD-10-CM

## 2013-08-03 DIAGNOSIS — R590 Localized enlarged lymph nodes: Secondary | ICD-10-CM

## 2013-08-03 NOTE — Telephone Encounter (Signed)
I reviewed the CT scan result with the patient. She has lymphadenopathy, anemia, leukopenia and splenomegaly, or suggestive of diagnosis of lymphoma. She had recent weight loss. I will order a PET CT scan for staging and consult General surgery for lymph node biopsy.

## 2013-08-03 NOTE — Telephone Encounter (Signed)
lmonvm for Thayer Headings, new pt coord @ [facility] requesting appt w/Dr. Lucia Gaskins. no other orders per 7/7 pof.

## 2013-08-05 ENCOUNTER — Telehealth: Payer: Self-pay | Admitting: Hematology and Oncology

## 2013-08-05 NOTE — Telephone Encounter (Signed)
received returned call from janice at [facility] re appt w/dr Lucia Gaskins. Thayer Headings lmonvm that pt is scheduled to see dr Lucia Gaskins 7/16 @ 2:45pm to arrive 2:30pm. per Thayer Headings she lm for pt but was not able to reach her directly. called pt and lmonvm confirming appt w/dr Lucia Gaskins and also pet scan for 7/17 and lb/NG for 7/21. schedule mailed and includes all appts.

## 2013-08-12 ENCOUNTER — Encounter (INDEPENDENT_AMBULATORY_CARE_PROVIDER_SITE_OTHER): Payer: Self-pay | Admitting: Surgery

## 2013-08-12 ENCOUNTER — Ambulatory Visit (INDEPENDENT_AMBULATORY_CARE_PROVIDER_SITE_OTHER): Payer: Medicare Other | Admitting: Surgery

## 2013-08-12 VITALS — BP 138/82 | HR 100 | Temp 98.0°F | Resp 18 | Ht 64.0 in | Wt 146.0 lb

## 2013-08-12 DIAGNOSIS — R590 Localized enlarged lymph nodes: Secondary | ICD-10-CM

## 2013-08-12 DIAGNOSIS — R161 Splenomegaly, not elsewhere classified: Secondary | ICD-10-CM

## 2013-08-12 DIAGNOSIS — R599 Enlarged lymph nodes, unspecified: Secondary | ICD-10-CM

## 2013-08-12 NOTE — Progress Notes (Addendum)
Re:   Jamie Mendez DOB:   January 01, 1934 MRN:   [id number]  ASSESSMENT AND PLAN: 1.  Retroperitoneal lymphadenopathy  She is for a PET scan tomorrow  I reviewed with her the planned surgery - biopsy of retroperitoneal lymph node..  That I would try to do this laparoscopically, but that I may have to do the surgery open.  The risks include bleeding, infection, lymph drainage, and bowel injurgy.  I will schedule the surgery at the earliest time.  [Dr. Ilda Foil called me about her PET scan.  The left axilla has some hot nodes and those would be easier to access than the retroperitoneum.  Will discuss with patient.  DN 08/14/2103]  2.  Splenomegaly 3.  Weight loss 4.  Anemia  Hgb - 8.9 - 05/18/2013 5.  DM   Last glucose - 263 - 07/29/2013  HgbA1C - 8.6 - 05/18/2013 6.  HTN 7.  Hyperlipidemia  Chief Complaint  Patient presents with  . New Evaluation    BX results/lymphoma   REFERRING PHYSICIAN:  Dr. Natale Lay  HISTORY OF PRESENT ILLNESS: Jamie Mendez is a 80 y.o. (DOB: 02-Feb-1933)  white  female whose primary care physician is [name],[name], MD and comes to me today for weight loss and adenopathy. Accompanied with son, Jamie Mendez, her son. She has been loosing weight since last summer.  She guesses that she has lost 40 pounds.  Her appetite has generally been okay.  She has noticed no change in her bowel habits.  She underwent a upper endo, a colonoscopy, and a capsule endoscopy by Dr. Deatra Ina around November 2014.  She apparently did have a "superficial linear gastric ulcer" and diverticulosis.  She did have one hemoccult that was positive, but the rest were negative.  She saw Dr. Alvy Bimler in June 2015, who thought that her anemia was due to chronic blood loss/malabsorption.  But she then obtained a CT scan on 07/29/2013 which showed Splenomegaly (volume 1220 cubic cm). No focal splenic lesions. Small calcified granulomas are noted. Upper abdominal lymphadenopathy with a 4.8 x 4.0 cm  nodal mass in the periportal region.  The patient has recently had some night sweats, but these have only occurred over the last 2 weeks.  She did get an iron infusion 2 weeks ago and she thinks that she feels a little better.  No history of liver disease.  No history of gall bladder disease.  No history of pancreas disease.   Past Medical History  Diagnosis Date  . Diabetes   . High blood pressure   . Hyperlipidemia   . Neuropathy   . Anemia   . Weight loss 07/23/2013  . Splenomegaly 07/23/2013    Past Surgical History  Procedure Laterality Date  . Colonoscopy    . Polypectomy  1989  . Breast surgery Right     biopsy-benign  . Esophagogastroduodenoscopy      Current Outpatient Prescriptions  Medication Sig Dispense Refill  . bisoprolol-hydrochlorothiazide (ZIAC) 5-6.25 MG per tablet       . Calcium Carbonate-Vit D-Min (CALCIUM 1200 PO) Take 1 tablet by mouth daily.      . cholecalciferol (VITAMIN D) 1000 UNITS tablet Take 1,000 Units by mouth. Three times per week      . Cinnamon 500 MG capsule Take 500 mg by mouth daily.      . Dapagliflozin-Metformin HCl ER (XIGDUO XR) 05-998 MG TB24 Take 1 tablet by mouth daily.      . enalapril (VASOTEC) 20  MG tablet TAKE ONE TABLET BY MOUTH ONCE DAILY FOR BLOOD PRESSURE  90 tablet  0  . ferrous sulfate 325 (65 FE) MG tablet Take 325 mg by mouth daily with breakfast.      . fish oil-omega-3 fatty acids 1000 MG capsule Take 2 g by mouth daily.      Marland Kitchen glimepiride (AMARYL) 4 MG tablet TAKE ONE TABLET BY MOUTH TWICE DAILY  180 tablet  0  . glucose blood test strip Use as instructed  100 each  12  . Magnesium 500 MG CAPS Take 1 capsule by mouth daily.      . Multiple Vitamins-Minerals (CENTRUM CARDIO) TABS Take 1 tablet by mouth daily.      . Red Yeast Rice 600 MG TABS Take 1 tablet by mouth daily.       No current facility-administered medications for this visit.      Allergies  Allergen Reactions  . Aspirin Other (See Comments)    Nose  bleeds  . Capoten [Captopril] Other (See Comments)    Mouth numbness   . Levemir [Insulin Detemir] Other (See Comments)    Decreased blood sugar  . Micronase [Glyburide]   . Onglyza [Saxagliptin] Other (See Comments)    Elevates blood sugar  . Statins     Numbness in face  . Zocor [Simvastatin] Other (See Comments)    Extreme fatigue    REVIEW OF SYSTEMS: Skin:  No history of rash.  No history of abnormal moles. Infection:  No history of hepatitis or HIV.  No history of MRSA. Neurologic:  No history of stroke.  No history of seizure.  No history of headaches. Cardiac:  HTN x 20 years.  Better with her weight loss.   No history of seeing a cardiologist. Pulmonary:  Does not smoke cigarettes.  No asthma or bronchitis.  No OSA/CPAP.  Endocrine:  DM x 15 years.  On oral hypoglycemics.   No thyroid disease. Gastrointestinal:  See HPI. Urologic:  Remote history of kidney stone - 05/26/1981. (maybe) Musculoskeletal:  No history of joint or back disease. Hematologic:  No bleeding disorder.  No history of anemia.  Not anticoagulated. Psycho-social:  The patient is oriented.   The patient has no obvious psychologic or social impairment to understanding our conversation and plan.  SOCIAL and FAMILY HISTORY: Widowed  (Her husband died in  05-26-1996) 3 sons - Tersa Fotopoulos (works Architect - on [employer]), Two live nearby and one lives in [place]. Accompanied with son, Jasie Meleski, her son. She knows [name] (I took care of her husband, [name], who died in 05-26-2013)  PHYSICAL EXAM: BP 138/82  Pulse 100  Temp(Src) 98 F (36.7 C)  Resp 18  Ht 5\' 4"  (1.626 m)  Wt 146 lb (66.225 kg)  BMI 25.05 kg/m2  General: WN older WF who is alert and generally healthy appearing.  HEENT: Normal. Pupils equal. Neck: Supple. No mass.  No thyroid mass. Lymph Nodes:  No supraclavicular, cervical, axillary, or inguinal nodes. Lungs: Clear to auscultation and symmetric breath sounds. Heart:   RRR. No murmur or rub. Breasts:  Right - no mass, circumareolar incision from prior biopsy  Left - no mass Abdomen: Soft. No mass. No tenderness. No hernia. Normal bowel sounds.  Her spleen is about 5 cm below the left costal margin Rectal: Not done. Extremities:  Good strength and ROM  in upper and lower extremities. Neurologic:  Grossly intact to motor and sensory function. Psychiatric: Has  normal mood and affect. Behavior is normal.   DATA REVIEWED: Epic notes  Alphonsa Overall, MD,  [facility] [address] [place].,  [place], [place]    [place] Phone:  [place]:  [phone]

## 2013-08-13 ENCOUNTER — Ambulatory Visit (HOSPITAL_COMMUNITY)
Admission: RE | Admit: 2013-08-13 | Discharge: 2013-08-13 | Disposition: A | Discharge status: Home / Self Care | Payer: Medicare Other | Source: Ambulatory Visit | Attending: Hematology and Oncology | Admitting: Hematology and Oncology

## 2013-08-13 DIAGNOSIS — R590 Localized enlarged lymph nodes: Secondary | ICD-10-CM

## 2013-08-13 DIAGNOSIS — R161 Splenomegaly, not elsewhere classified: Secondary | ICD-10-CM | POA: Insufficient documentation

## 2013-08-13 DIAGNOSIS — R599 Enlarged lymph nodes, unspecified: Secondary | ICD-10-CM | POA: Insufficient documentation

## 2013-08-13 DIAGNOSIS — C8589 Other specified types of non-Hodgkin lymphoma, extranodal and solid organ sites: Secondary | ICD-10-CM | POA: Insufficient documentation

## 2013-08-13 MED ORDER — FLUDEOXYGLUCOSE F - 18 (FDG) INJECTION
8.0000 | Freq: Once | INTRAVENOUS | Status: AC | PRN
  Administered 2013-08-13: 8 via INTRAVENOUS

## 2013-08-16 LAB — GLUCOSE, CAPILLARY: Glucose-Capillary: 242 mg/dL — ABNORMAL HIGH (ref 70–99)

## 2013-08-17 ENCOUNTER — Other Ambulatory Visit (HOSPITAL_BASED_OUTPATIENT_CLINIC_OR_DEPARTMENT_OTHER): Payer: Medicare Other

## 2013-08-17 ENCOUNTER — Telehealth: Payer: Self-pay | Admitting: *Deleted

## 2013-08-17 ENCOUNTER — Telehealth: Payer: Self-pay | Admitting: Hematology and Oncology

## 2013-08-17 ENCOUNTER — Ambulatory Visit (HOSPITAL_BASED_OUTPATIENT_CLINIC_OR_DEPARTMENT_OTHER): Payer: Medicare Other | Admitting: Hematology and Oncology

## 2013-08-17 VITALS — BP 143/47 | HR 103 | Temp 98.5°F | Resp 18 | Ht 64.0 in | Wt 147.2 lb

## 2013-08-17 DIAGNOSIS — R599 Enlarged lymph nodes, unspecified: Secondary | ICD-10-CM

## 2013-08-17 DIAGNOSIS — R634 Abnormal weight loss: Secondary | ICD-10-CM

## 2013-08-17 DIAGNOSIS — R161 Splenomegaly, not elsewhere classified: Secondary | ICD-10-CM

## 2013-08-17 DIAGNOSIS — D500 Iron deficiency anemia secondary to blood loss (chronic): Secondary | ICD-10-CM

## 2013-08-17 DIAGNOSIS — R590 Localized enlarged lymph nodes: Secondary | ICD-10-CM

## 2013-08-17 LAB — CBC & DIFF AND RETIC
BASO%: 1 % (ref 0.0–2.0)
Basophils Absolute: 0 10*3/uL (ref 0.0–0.1)
EOS%: 1 % (ref 0.0–7.0)
Eosinophils Absolute: 0 10*3/uL (ref 0.0–0.5)
HEMATOCRIT: 28.8 % — AB (ref 34.8–46.6)
HGB: 8.9 g/dL — ABNORMAL LOW (ref 11.6–15.9)
Immature Retic Fract: 10.5 % — ABNORMAL HIGH (ref 1.60–10.00)
LYMPH%: 19.6 % (ref 14.0–49.7)
MCH: 23.5 pg — AB (ref 25.1–34.0)
MCHC: 30.9 g/dL — AB (ref 31.5–36.0)
MCV: 76.2 fL — ABNORMAL LOW (ref 79.5–101.0)
MONO#: 0.2 10*3/uL (ref 0.1–0.9)
MONO%: 6.3 % (ref 0.0–14.0)
NEUT#: 2.8 10*3/uL (ref 1.5–6.5)
NEUT%: 72.1 % (ref 38.4–76.8)
PLATELETS: 229 10*3/uL (ref 145–400)
RBC: 3.78 10*6/uL (ref 3.70–5.45)
RDW: 19.5 % — ABNORMAL HIGH (ref 11.2–14.5)
Retic %: 1.85 % (ref 0.70–2.10)
Retic Ct Abs: 69.93 10*3/uL (ref 33.70–90.70)
WBC: 3.8 10*3/uL — ABNORMAL LOW (ref 3.9–10.3)
lymph#: 0.8 10*3/uL — ABNORMAL LOW (ref 0.9–3.3)

## 2013-08-17 LAB — IRON AND TIBC CHCC
%SAT: 9 % — ABNORMAL LOW (ref 21–57)
Iron: 18 ug/dL — ABNORMAL LOW (ref 41–142)
TIBC: 203 ug/dL — ABNORMAL LOW (ref 236–444)
UIBC: 185 ug/dL (ref 120–384)

## 2013-08-17 LAB — FERRITIN CHCC: Ferritin: 889 ng/ml — ABNORMAL HIGH (ref 9–269)

## 2013-08-17 NOTE — Telephone Encounter (Signed)
gv adn rpinted appt sched adn avs for opt for Aug

## 2013-08-17 NOTE — Telephone Encounter (Signed)
Confirmed bone marrow biopsy for 08/27/13 @ 0700

## 2013-08-17 NOTE — Assessment & Plan Note (Signed)
This is likely related to undiagnosed lymphoma.

## 2013-08-17 NOTE — Progress Notes (Signed)
[place], MD SUMMARY OF HEMATOLOGIC HISTORY: She was found to have abnormal CBC from routine blood work. Review of blood work since October 2014 show persistent anemia with progression with microcytosis. EGD and colonoscopy done in 2014 show erosive gastritis/esophagitis. She was placed on iron supplement 3 times a day with meals with no improvement. The patient has 40 pound weight loss over the past year due to anorexia. Physical examination revealed palpable splenomegaly, confirmed on imaging study which also revealed lymphadenopathy. On 07/28/2013, she received 1 dose of intravenous iron feraheme. INTERVAL HISTORY: MATY ZEISLER 80 y.o. female returns for followup visit with her son. She felt more energetic after iron infusion. Her appetite remains poor.  I have reviewed the past medical history, past surgical history, social history and family history with the patient and they are unchanged from previous note.  ALLERGIES:  is allergic to aspirin; capoten; levemir; micronase; onglyza; statins; and zocor.  MEDICATIONS:  Current Outpatient Prescriptions  Medication Sig Dispense Refill  . Calcium Carbonate-Vit D-Min (CALCIUM 1200 PO) Take 1 tablet by mouth daily.      . cholecalciferol (VITAMIN D) 1000 UNITS tablet Take 1,000 Units by mouth. Three times per week      . Cinnamon 500 MG capsule Take 500 mg by mouth daily.      . Dapagliflozin-Metformin HCl ER (XIGDUO XR) 05-998 MG TB24 Take 1 tablet by mouth daily.      . enalapril (VASOTEC) 20 MG tablet TAKE ONE TABLET BY MOUTH ONCE DAILY FOR BLOOD PRESSURE  90 tablet  0  . ferrous sulfate 325 (65 FE) MG tablet Take 325 mg by mouth daily with breakfast.      . fish oil-omega-3 fatty acids 1000 MG capsule Take 2 g by mouth daily.      Marland Kitchen glimepiride (AMARYL) 4 MG tablet TAKE ONE TABLET BY MOUTH TWICE DAILY  180 tablet  0  . glucose blood test strip Use as instructed  100 each  12   . Magnesium 500 MG CAPS Take 1 capsule by mouth daily.      . Multiple Vitamins-Minerals (CENTRUM CARDIO) TABS Take 1 tablet by mouth daily.      . Red Yeast Rice 600 MG TABS Take 1 tablet by mouth daily.       No current facility-administered medications for this visit.     REVIEW OF SYSTEMS:   All other systems were reviewed with the patient and are negative.  PHYSICAL EXAMINATION: ECOG PERFORMANCE STATUS: 1 - Symptomatic but completely ambulatory  Filed Vitals:   08/17/13 1049  BP: 143/47  Pulse: 103  Temp: 98.5 F (36.9 C)  Resp: 18   Filed Weights   08/17/13 1049  Weight: 147 lb 3.2 oz (66.769 kg)    GENERAL:alert, no distress and comfortable. She looks thin and mildly cachectic SKIN: skin color, texture, turgor are normal, no rashes or significant lesions Musculoskeletal:no cyanosis of digits and no clubbing  NEURO: alert & oriented x 3 with fluent speech, no focal motor/sensory deficits  LABORATORY DATA:  I have reviewed the data as listed Results for orders placed in visit on 08/17/13 (from the past 48 hour(s))  CBC & DIFF AND RETIC     Status: Abnormal   Collection Time    08/17/13 10:24 AM      Result Value Ref Range   WBC 3.8 (*) 3.9 - 10.3 10e3/uL   NEUT# 2.8  1.5 - 6.5 10e3/uL  HGB 8.9 (*) 11.6 - 15.9 g/dL   HCT 28.8 (*) 34.8 - 46.6 %   Platelets 229  145 - 400 10e3/uL   MCV 76.2 (*) 79.5 - 101.0 fL   MCH 23.5 (*) 25.1 - 34.0 pg   MCHC 30.9 (*) 31.5 - 36.0 g/dL   RBC 3.78  3.70 - 5.45 10e6/uL   RDW 19.5 (*) 11.2 - 14.5 %   lymph# 0.8 (*) 0.9 - 3.3 10e3/uL   MONO# 0.2  0.1 - 0.9 10e3/uL   Eosinophils Absolute 0.0  0.0 - 0.5 10e3/uL   Basophils Absolute 0.0  0.0 - 0.1 10e3/uL   NEUT% 72.1  38.4 - 76.8 %   LYMPH% 19.6  14.0 - 49.7 %   MONO% 6.3  0.0 - 14.0 %   EOS% 1.0  0.0 - 7.0 %   BASO% 1.0  0.0 - 2.0 %   Retic % 1.85  0.70 - 2.10 %   Retic Ct Abs 69.93  33.70 - 90.70 10e3/uL   Immature Retic Fract 10.50 (*) 1.60 - 10.00 %  FERRITIN [facility]      Status: Abnormal   Collection Time    08/17/13 10:24 AM      Result Value Ref Range   Ferritin 889 (*) 9 - 269 ng/ml  IRON AND TIBC [facility]     Status: Abnormal   Collection Time    08/17/13 10:24 AM      Result Value Ref Range   Iron 18 (*) 41 - 142 ug/dL   TIBC 203 (*) 236 - 444 ug/dL   UIBC 185  120 - 384 ug/dL   %SAT 9 (*) 21 - 57 %    Lab Results  Component Value Date   WBC 3.8* 08/17/2013   HGB 8.9* 08/17/2013   HCT 28.8* 08/17/2013   MCV 76.2* 08/17/2013   PLT 229 08/17/2013    RADIOGRAPHIC STUDIES: Reviewed the PET/CT scan with her and son. I have personally reviewed the radiological images as listed and agreed with the findings in the report.  ASSESSMENT & PLAN:  Lymphadenopathy, retroperitoneal This is highly suspicious for lymphoma. I am waiting for excisional biopsy to confirm the diagnosis. I told the patient depending on the subtype of lymphoma, she may need require systemic chemotherapy and staging bone marrow aspirate and biopsy. I will call her next week to discuss test results. I will proceed with scheduling bone marrow aspirate and biopsy but may cancel it if she turns out to have low-grade disease that would not require treatment.  Weight loss The study related to undiagnosed lymphoma. In the meantime I recommended she increase oral intake and nutritional supplements as tolerated.  Splenomegaly  This is likely related to undiagnosed lymphoma.  Iron deficiency anemia due to chronic blood loss Anemia did not improve with intravenous iron replacement. I am concerned she may have bone marrow involvement. I will order a bone marrow aspirate and biopsy to be performed and that the week after results of lymph node biopsy became available    All questions were answered. The patient knows to call the clinic with any problems, questions or concerns. No barriers to learning was detected.  I spent 25 minutes counseling the patient face to face. The total time spent in  the appointment was 30 minutes and more than 50% was on counseling.     [facility], Indianna Boran, MD 08/17/2013 8:41 PM

## 2013-08-17 NOTE — Assessment & Plan Note (Signed)
Anemia did not improve with intravenous iron replacement. I am concerned she may have bone marrow involvement. I will order a bone marrow aspirate and biopsy to be performed and that the week after results of lymph node biopsy became available

## 2013-08-17 NOTE — Assessment & Plan Note (Addendum)
This is highly suspicious for lymphoma. I am waiting for excisional biopsy to confirm the diagnosis. I told the patient depending on the subtype of lymphoma, she may need require systemic chemotherapy and staging bone marrow aspirate and biopsy. I will call her next week to discuss test results. I will proceed with scheduling bone marrow aspirate and biopsy but may cancel it if she turns out to have low-grade disease that would not require treatment.

## 2013-08-17 NOTE — Assessment & Plan Note (Signed)
The study related to undiagnosed lymphoma. In the meantime I recommended she increase oral intake and nutritional supplements as tolerated.

## 2013-08-18 ENCOUNTER — Ambulatory Visit: Payer: Self-pay | Admitting: Physician Assistant

## 2013-08-18 ENCOUNTER — Encounter (HOSPITAL_BASED_OUTPATIENT_CLINIC_OR_DEPARTMENT_OTHER): Payer: Self-pay | Admitting: *Deleted

## 2013-08-18 NOTE — Progress Notes (Signed)
All labs and ekg done

## 2013-08-23 ENCOUNTER — Other Ambulatory Visit (HOSPITAL_COMMUNITY): Payer: Medicare Other

## 2013-08-23 ENCOUNTER — Ambulatory Visit (HOSPITAL_BASED_OUTPATIENT_CLINIC_OR_DEPARTMENT_OTHER): Payer: Medicare Other | Admitting: Anesthesiology

## 2013-08-23 ENCOUNTER — Encounter (HOSPITAL_BASED_OUTPATIENT_CLINIC_OR_DEPARTMENT_OTHER): Payer: Medicare Other | Admitting: Anesthesiology

## 2013-08-23 ENCOUNTER — Encounter (HOSPITAL_BASED_OUTPATIENT_CLINIC_OR_DEPARTMENT_OTHER)
Admission: RE | Disposition: A | Discharge status: Home / Self Care | Payer: Self-pay | Source: Ambulatory Visit | Attending: Surgery

## 2013-08-23 ENCOUNTER — Ambulatory Visit (HOSPITAL_BASED_OUTPATIENT_CLINIC_OR_DEPARTMENT_OTHER)
Admission: RE | Admit: 2013-08-23 | Discharge: 2013-08-23 | Disposition: A | Discharge status: Home / Self Care | Payer: Medicare Other | Source: Ambulatory Visit | Attending: Surgery | Admitting: Surgery

## 2013-08-23 ENCOUNTER — Encounter (HOSPITAL_BASED_OUTPATIENT_CLINIC_OR_DEPARTMENT_OTHER): Payer: Self-pay | Admitting: Anesthesiology

## 2013-08-23 DIAGNOSIS — R599 Enlarged lymph nodes, unspecified: Secondary | ICD-10-CM | POA: Diagnosis present

## 2013-08-23 DIAGNOSIS — R634 Abnormal weight loss: Secondary | ICD-10-CM | POA: Insufficient documentation

## 2013-08-23 DIAGNOSIS — E785 Hyperlipidemia, unspecified: Secondary | ICD-10-CM | POA: Diagnosis not present

## 2013-08-23 DIAGNOSIS — R161 Splenomegaly, not elsewhere classified: Secondary | ICD-10-CM | POA: Diagnosis not present

## 2013-08-23 DIAGNOSIS — E119 Type 2 diabetes mellitus without complications: Secondary | ICD-10-CM | POA: Diagnosis not present

## 2013-08-23 DIAGNOSIS — I10 Essential (primary) hypertension: Secondary | ICD-10-CM | POA: Insufficient documentation

## 2013-08-23 DIAGNOSIS — D649 Anemia, unspecified: Secondary | ICD-10-CM | POA: Insufficient documentation

## 2013-08-23 DIAGNOSIS — C8584 Other specified types of non-Hodgkin lymphoma, lymph nodes of axilla and upper limb: Secondary | ICD-10-CM | POA: Insufficient documentation

## 2013-08-23 DIAGNOSIS — G589 Mononeuropathy, unspecified: Secondary | ICD-10-CM | POA: Diagnosis not present

## 2013-08-23 DIAGNOSIS — R590 Localized enlarged lymph nodes: Secondary | ICD-10-CM

## 2013-08-23 HISTORY — PX: AXILLARY LYMPH NODE BIOPSY: SHX5737

## 2013-08-23 HISTORY — DX: Presence of spectacles and contact lenses: Z97.3

## 2013-08-23 LAB — GLUCOSE, CAPILLARY
GLUCOSE-CAPILLARY: 123 mg/dL — AB (ref 70–99)
GLUCOSE-CAPILLARY: 90 mg/dL (ref 70–99)
Glucose-Capillary: 159 mg/dL — ABNORMAL HIGH (ref 70–99)

## 2013-08-23 LAB — POCT HEMOGLOBIN-HEMACUE: Hemoglobin: 9.8 g/dL — ABNORMAL LOW (ref 12.0–15.0)

## 2013-08-23 SURGERY — AXILLARY LYMPH NODE BIOPSY
Anesthesia: General | Site: Axilla | Laterality: Left

## 2013-08-23 MED ORDER — CHLORHEXIDINE GLUCONATE 4 % EX LIQD: 1.0000 "application " | Freq: Once | CUTANEOUS | Status: DC

## 2013-08-23 MED ORDER — ROCURONIUM BROMIDE 50 MG/5ML IV SOLN
INTRAVENOUS | Status: AC
  Filled 2013-08-23: qty 1

## 2013-08-23 MED ORDER — BUPIVACAINE HCL (PF) 0.25 % IJ SOLN
INTRAMUSCULAR | Status: AC
  Filled 2013-08-23: qty 30

## 2013-08-23 MED ORDER — LACTATED RINGERS IV SOLN
INTRAVENOUS | Status: DC
  Administered 2013-08-23: 14:00:00 via INTRAVENOUS

## 2013-08-23 MED ORDER — ONDANSETRON HCL 4 MG/2ML IJ SOLN
INTRAMUSCULAR | Status: DC | PRN
  Administered 2013-08-23: 4 mg via INTRAVENOUS

## 2013-08-23 MED ORDER — HYDROCODONE-ACETAMINOPHEN 5-325 MG PO TABS: 1.0000 | ORAL_TABLET | Freq: Four times a day (QID) | ORAL | Status: DC | PRN

## 2013-08-23 MED ORDER — MIDAZOLAM HCL 2 MG/2ML IJ SOLN
INTRAMUSCULAR | Status: AC
  Filled 2013-08-23: qty 2

## 2013-08-23 MED ORDER — FENTANYL CITRATE 0.05 MG/ML IJ SOLN
INTRAMUSCULAR | Status: AC
  Filled 2013-08-23: qty 6

## 2013-08-23 MED ORDER — MIDAZOLAM HCL 2 MG/2ML IJ SOLN: 1.0000 mg | INTRAMUSCULAR | Status: DC | PRN

## 2013-08-23 MED ORDER — DEXAMETHASONE SODIUM PHOSPHATE 4 MG/ML IJ SOLN
INTRAMUSCULAR | Status: DC | PRN
  Administered 2013-08-23: 10 mg via INTRAVENOUS

## 2013-08-23 MED ORDER — PROPOFOL 10 MG/ML IV BOLUS
INTRAVENOUS | Status: DC | PRN
  Administered 2013-08-23: 120 mg via INTRAVENOUS

## 2013-08-23 MED ORDER — FENTANYL CITRATE 0.05 MG/ML IJ SOLN
INTRAMUSCULAR | Status: DC | PRN
  Administered 2013-08-23: 100 ug via INTRAVENOUS

## 2013-08-23 MED ORDER — BUPIVACAINE HCL (PF) 0.25 % IJ SOLN
INTRAMUSCULAR | Status: DC | PRN
  Administered 2013-08-23: 20 mL

## 2013-08-23 MED ORDER — FENTANYL CITRATE 0.05 MG/ML IJ SOLN: 50.0000 ug | INTRAMUSCULAR | Status: DC | PRN

## 2013-08-23 SURGICAL SUPPLY — 60 items
ADH SKN CLS APL DERMABOND .7 (GAUZE/BANDAGES/DRESSINGS) ×1
APL SKNCLS STERI-STRIP NONHPOA (GAUZE/BANDAGES/DRESSINGS)
APPLIER CLIP 11 MED OPEN (CLIP)
APR CLP MED 11 20 MLT OPN (CLIP)
BENZOIN TINCTURE PRP APPL 2/3 (GAUZE/BANDAGES/DRESSINGS) IMPLANT
BLADE SURG 10 STRL SS (BLADE) ×3 IMPLANT
BLADE SURG 15 STRL LF DISP TIS (BLADE) ×1 IMPLANT
BLADE SURG 15 STRL SS (BLADE) ×3
CANISTER SUCT 1200ML W/VALVE (MISCELLANEOUS) ×3 IMPLANT
CHLORAPREP W/TINT 26ML (MISCELLANEOUS) ×3 IMPLANT
CLIP APPLIE 11 MED OPEN (CLIP) IMPLANT
CLIP TI MEDIUM 6 (CLIP) IMPLANT
CLIP TI WIDE RED SMALL 6 (CLIP) IMPLANT
CLOSURE WOUND 1/2 X4 (GAUZE/BANDAGES/DRESSINGS)
COVER MAYO STAND STRL (DRAPES) ×3 IMPLANT
COVER TABLE BACK 60X90 (DRAPES) ×3 IMPLANT
DECANTER SPIKE VIAL GLASS SM (MISCELLANEOUS) ×2 IMPLANT
DERMABOND ADVANCED (GAUZE/BANDAGES/DRESSINGS) ×2
DERMABOND ADVANCED .7 DNX12 (GAUZE/BANDAGES/DRESSINGS) ×1 IMPLANT
DRAIN CHANNEL 19F RND (DRAIN) IMPLANT
DRAIN HEMOVAC 1/8 X 5 (WOUND CARE) IMPLANT
DRAPE LAPAROTOMY T 102X78X121 (DRAPES) ×3 IMPLANT
DRAPE UTILITY XL STRL (DRAPES) ×3 IMPLANT
ELECT COATED BLADE 2.86 ST (ELECTRODE) ×3 IMPLANT
ELECT REM PT RETURN 9FT ADLT (ELECTROSURGICAL) ×3
ELECTRODE REM PT RTRN 9FT ADLT (ELECTROSURGICAL) ×1 IMPLANT
EVACUATOR SILICONE 100CC (DRAIN) IMPLANT
GAUZE SPONGE 4X4 16PLY XRAY LF (GAUZE/BANDAGES/DRESSINGS) IMPLANT
GLOVE BIO SURGEON STRL SZ7.5 (GLOVE) ×3 IMPLANT
GLOVE BIOGEL PI IND STRL 8 (GLOVE) ×1 IMPLANT
GLOVE BIOGEL PI INDICATOR 8 (GLOVE) ×2
GLOVE SURG SIGNA 7.5 PF LTX (GLOVE) ×3 IMPLANT
GOWN STRL REUS W/ TWL LRG LVL3 (GOWN DISPOSABLE) ×2 IMPLANT
GOWN STRL REUS W/ TWL XL LVL3 (GOWN DISPOSABLE) IMPLANT
GOWN STRL REUS W/TWL LRG LVL3 (GOWN DISPOSABLE) ×3
GOWN STRL REUS W/TWL XL LVL3 (GOWN DISPOSABLE) ×3
NEEDLE HYPO 25X1 1.5 SAFETY (NEEDLE) ×3 IMPLANT
NS IRRIG 1000ML POUR BTL (IV SOLUTION) ×2 IMPLANT
PACK BASIN DAY SURGERY FS (CUSTOM PROCEDURE TRAY) ×3 IMPLANT
PENCIL BUTTON HOLSTER BLD 10FT (ELECTRODE) ×3 IMPLANT
PIN SAFETY STERILE (MISCELLANEOUS) IMPLANT
SHEET MEDIUM DRAPE 40X70 STRL (DRAPES) ×3 IMPLANT
SLEEVE SCD COMPRESS KNEE MED (MISCELLANEOUS) ×2 IMPLANT
SPONGE GAUZE 4X4 12PLY STER LF (GAUZE/BANDAGES/DRESSINGS) IMPLANT
SPONGE LAP 18X18 X RAY DECT (DISPOSABLE) IMPLANT
STRIP CLOSURE SKIN 1/2X4 (GAUZE/BANDAGES/DRESSINGS) IMPLANT
SUT ETHILON 2 0 FS 18 (SUTURE) IMPLANT
SUT SILK 3 0 TIES 17X18 (SUTURE) ×3
SUT SILK 3-0 18XBRD TIE BLK (SUTURE) ×1 IMPLANT
SUT VIC AB 5-0 PS2 18 (SUTURE) ×3 IMPLANT
SUT VICRYL 3-0 CR8 SH (SUTURE) ×3 IMPLANT
SUT VICRYL AB 2 0 TIE (SUTURE) ×1 IMPLANT
SUT VICRYL AB 2 0 TIES (SUTURE) ×3
SYR BULB 3OZ (MISCELLANEOUS) ×3 IMPLANT
SYRINGE CONTROL L 12CC (SYRINGE) ×3 IMPLANT
TOWEL OR 17X24 6PK STRL BLUE (TOWEL DISPOSABLE) ×3 IMPLANT
TOWEL OR NON WOVEN STRL DISP B (DISPOSABLE) ×3 IMPLANT
TUBE CONNECTING 20'X1/4 (TUBING) ×1
TUBE CONNECTING 20X1/4 (TUBING) ×2 IMPLANT
YANKAUER SUCT BULB TIP NO VENT (SUCTIONS) ×3 IMPLANT

## 2013-08-23 NOTE — Anesthesia Procedure Notes (Signed)
Procedure Name: LMA Insertion Performed by: Terrance Mass Pre-anesthesia Checklist: Patient identified, Timeout performed, Emergency Drugs available, Suction available and Patient being monitored Patient Re-evaluated:Patient Re-evaluated prior to inductionOxygen Delivery Method: Circle system utilized Preoxygenation: Pre-oxygenation with 100% oxygen Intubation Type: IV induction Ventilation: Mask ventilation without difficulty LMA: LMA inserted LMA Size: 4.0 Number of attempts: 1 Tube secured with: Tape Dental Injury: Teeth and Oropharynx as per pre-operative assessment

## 2013-08-23 NOTE — Transfer of Care (Addendum)
Immediate Anesthesia Transfer of Care Note  Patient: Jamie Mendez  Procedure(s) Performed: Procedure(s): LEFT AXILLARY LYMPH NODE BIOPSY (Left)  Patient Location: PACU  Anesthesia Type:General  Level of Consciousness: awake and sedated  Airway & Oxygen Therapy: Patient Spontanous Breathing  Post-op Assessment: Report given to PACU RN and Post -op Vital signs reviewed and stable  Post vital signs: Reviewed and stable  Complications: No apparent anesthesia complications

## 2013-08-23 NOTE — Interval H&P Note (Signed)
History and Physical Interval Note:  08/23/2013 4:38 PM  Jamie Mendez  has presented today for surgery, with the diagnosis of adenopathy  The various methods of treatment have been discussed with the patient and family.   PET scan lit up left axilla, so will biopsy this node first.  Her son is at the bedside.  After consideration of risks, benefits and other options for treatment, the patient has consented to  Procedure(s): LEFT AXILLARY LYMPH NODE BIOPSY (Left) as a surgical intervention .  The patient's history has been reviewed, patient examined, no change in status, stable for surgery.  I have reviewed the patient's chart and labs.  Questions were answered to the patient's satisfaction.     Rosetta Rupnow H

## 2013-08-23 NOTE — Discharge Instructions (Signed)
[facility] - DISCHARGE INSTRUCTIONS TO PATIENT  Activity:  Driving - may drive in a day or two if doing well   Lifting - No lifting more than 15 pounds for one week, then no limit.  Wound Care:   Leave bandage for 2 days, then may remove the bandage and shower.  Diet:  As tolerated.  Follow up appointment:  Call Dr. Pollie Friar office [facility]) at [phone] for an appointment in 2 to 3 weeks.  Medications and dosages:  Resume your home medications.  You have a prescription for:  Vicodin  Call Dr. Lucia Gaskins or his office  [phone]) if you have:  Temperature greater than 100.4,  Persistent nausea and vomiting,  Severe uncontrolled pain,  Redness, tenderness, or signs of infection (pain, swelling, redness, odor or green/yellow discharge around the site),  Difficulty breathing, headache or visual disturbances,  Any other questions or concerns you may have after discharge.  In an emergency, call 911 or go to an Emergency Department at a nearby hospital.    Post Anesthesia Home Care Instructions  Activity: Get plenty of rest for the remainder of the day. A responsible adult should stay with you for 24 hours following the procedure.  For the next 24 hours, DO NOT: -Drive a car -Paediatric nurse -Drink alcoholic beverages -Take any medication unless instructed by your physician -Make any legal decisions or sign important papers.  Meals: Start with liquid foods such as gelatin or soup. Progress to regular foods as tolerated. Avoid greasy, spicy, heavy foods. If nausea and/or vomiting occur, drink only clear liquids until the nausea and/or vomiting subsides. Call your physician if vomiting continues.  Special Instructions/Symptoms: Your throat may feel dry or sore from the anesthesia or the breathing tube placed in your throat during surgery. If this causes discomfort, gargle with warm salt water. The discomfort should disappear within 24 hours.

## 2013-08-23 NOTE — Op Note (Addendum)
08/23/2013  5:44 PM  PATIENT:  Jamie Mendez, 80 y.o., female, MRN: [id number]  PREOP DIAGNOSIS:  adenopathy  POSTOP DIAGNOSIS:   Lymphadenopathy, left axilla  PROCEDURE:   Procedure(s): LEFT AXILLARY LYMPH NODE BIOPSY  SURGEON:   Alphonsa Overall, M.D.  ANESTHESIA:   general  Anesthesiologist: Napoleon Form, MD CRNA: Terrance Mass, CRNA  General  EBL:  minimal  ml  DRAINS: none   LOCAL MEDICATIONS USED:   20  cc 1/4% marcaine  SPECIMEN:   left axillary lymph node  COUNTS CORRECT:  YES  INDICATIONS FOR PROCEDURE:  [name] is a 80 y.o. (DOB: [birth year]) white  female whose primary care physician is [name],[name], MD and comes for left axillary lymph node biopsy as part of a workup for possible lymphoma.  She sees Dr. Alvy Bimler for medical oncology.   The indications and risks of the surgery were explained to the patient.  The risks include, but are not limited to, infection, bleeding, and nerve injury.  PROCEDURE NOTE:  The patient was taken to room #8 at [facility].  She had a general anesthesia.  She was placed in a supine position with her left arm out.  Her left axilla was prepped with Chloroprep and sterilely draped.   A time out was held and the surgical checklist run.     I did an [place] at the beginning of the case and I thought I saw a lymph node that corresponded to the PET scan findings.  I made an incision in the upper axilla and cut down on an enlarged lymph node.  The lymph node was about 2.5 cm in maximum diameter, but was soft.  It had been hard to feel on palpation.  There was an adjacent lymph node about 1.5 cm in size beside it that I also took.  Both of these lymph nodes were sent to pathology.   Hemostasis was controlled with electrocautery and 3-0 vicryl sutures.  The wound was closed with 3-0 Vicryl sutures and a 5-0 Vicryl in the skin.  The skin was painted with Dermabond and sterilely dressed with gauze.  Elenore Rota, at the [facility],  ran the specimen to the pathology department for a lymphoma workup.   The patient tolerated the procedure well and was transferred to the recovery room.   The sponge and needle count were correct.  Alphonsa Overall, MD, [facility] Pager: [phone] Office phone:  [phone]

## 2013-08-23 NOTE — Anesthesia Preprocedure Evaluation (Addendum)
Anesthesia Evaluation  Patient identified by MRN, date of birth, ID band Patient awake    Reviewed: Allergy & Precautions, H&P , NPO status , Patient's Chart, lab work & pertinent test results  Airway Mallampati: I TM Distance: >3 FB Neck ROM: Full    Dental  (+) Teeth Intact, Dental Advisory Given   Pulmonary  breath sounds clear to auscultation        Cardiovascular hypertension, Pt. on medications Rhythm:Regular Rate:Normal     Neuro/Psych    GI/Hepatic   Endo/Other  diabetes, Poorly Controlled, Type 2, Oral Hypoglycemic Agents  Renal/GU      Musculoskeletal   Abdominal   Peds  Hematology   Anesthesia Other Findings   Reproductive/Obstetrics                          Anesthesia Physical Anesthesia Plan  ASA: III  Anesthesia Plan: General   Post-op Pain Management:    Induction: Intravenous  Airway Management Planned: LMA  Additional Equipment:   Intra-op Plan:   Post-operative Plan: Extubation in OR  Informed Consent: I have reviewed the patients History and Physical, chart, labs and discussed the procedure including the risks, benefits and alternatives for the proposed anesthesia with the patient or authorized representative who has indicated his/her understanding and acceptance.   Dental advisory given  Plan Discussed with: CRNA, Anesthesiologist and Surgeon  Anesthesia Plan Comments:         Anesthesia Quick Evaluation

## 2013-08-23 NOTE — Anesthesia Postprocedure Evaluation (Signed)
  Anesthesia Post-op Note  Patient: Jamie Mendez  Procedure(s) Performed: Procedure(s): LEFT AXILLARY LYMPH NODE BIOPSY (Left)  Patient Location: PACU  Anesthesia Type: General   Level of Consciousness: awake, alert  and oriented  Airway and Oxygen Therapy: Patient Spontanous Breathing  Post-op Pain: mild  Post-op Assessment: Post-op Vital signs reviewed  Post-op Vital Signs: Reviewed  Last Vitals:  Filed Vitals:   08/23/13 1800  BP: 153/64  Pulse: 97  Temp:   Resp: 19    Complications: No apparent anesthesia complications

## 2013-08-23 NOTE — H&P (View-Only) (Signed)
Re:   [name] DOB:   1933/09/23 MRN:   [id number]  ASSESSMENT AND PLAN: 1.  Retroperitoneal lymphadenopathy  She is for a PET scan tomorrow  I reviewed with her the planned surgery - biopsy of retroperitoneal lymph node..  That I would try to do this laparoscopically, but that I may have to do the surgery open.  The risks include bleeding, infection, lymph drainage, and bowel injurgy.  I will schedule the surgery at the earliest time.  [Dr. Ilda Foil called me about her PET scan.  The left axilla has some hot nodes and those would be easier to access than the retroperitoneum.  Will discuss with patient.  DN 08/14/2103]  2.  Splenomegaly 3.  Weight loss 4.  Anemia  Hgb - 8.9 - 05/18/2013 5.  DM   Last glucose - 263 - 07/29/2013  HgbA1C - 8.6 - 05/18/2013 6.  HTN 7.  Hyperlipidemia  Chief Complaint  Patient presents with  . New Evaluation    BX results/lymphoma   REFERRING PHYSICIAN:  Dr. Natale Lay  HISTORY OF PRESENT ILLNESS: Jamie Mendez is a 80 y.o. (DOB: [birth year])  white  female whose primary care physician is Jamie Mendez and comes to me today for weight loss and adenopathy. Accompanied with son, [name], her son. She has been loosing weight since last summer.  She guesses that she has lost 40 pounds.  Her appetite has generally been okay.  She has noticed no change in her bowel habits.  She underwent a upper endo, a colonoscopy, and a capsule endoscopy by Dr. Deatra Ina around November 2014.  She apparently did have a "superficial linear gastric ulcer" and diverticulosis.  She did have one hemoccult that was positive, but the rest were negative.  She saw Dr. Alvy Bimler in June 2015, who thought that her anemia was due to chronic blood loss/malabsorption.  But she then obtained a CT scan on 07/29/2013 which showed Splenomegaly (volume 1220 cubic cm). No focal splenic lesions. Small calcified granulomas are noted. Upper abdominal lymphadenopathy with a 4.8 x 4.0 cm  nodal mass in the periportal region.  The patient has recently had some night sweats, but these have only occurred over the last 2 weeks.  She did get an iron infusion 2 weeks ago and she thinks that she feels a little better.  No history of liver disease.  No history of gall bladder disease.  No history of pancreas disease.   Past Medical History  Diagnosis Date  . Diabetes   . High blood pressure   . Hyperlipidemia   . Neuropathy   . Anemia   . Weight loss 07/23/2013  . Splenomegaly 07/23/2013    Past Surgical History  Procedure Laterality Date  . Colonoscopy    . Polypectomy  1989  . Breast surgery Right     biopsy-benign  . Esophagogastroduodenoscopy      Current Outpatient Prescriptions  Medication Sig Dispense Refill  . bisoprolol-hydrochlorothiazide (ZIAC) 5-6.25 MG per tablet       . Calcium Carbonate-Vit D-Min (CALCIUM 1200 PO) Take 1 tablet by mouth daily.      . cholecalciferol (VITAMIN D) 1000 UNITS tablet Take 1,000 Units by mouth. Three times per week      . Cinnamon 500 MG capsule Take 500 mg by mouth daily.      . Dapagliflozin-Metformin HCl ER (XIGDUO XR) 05-998 MG TB24 Take 1 tablet by mouth daily.      . enalapril (VASOTEC) 20  MG tablet TAKE ONE TABLET BY MOUTH ONCE DAILY FOR BLOOD PRESSURE  90 tablet  0  . ferrous sulfate 325 (65 FE) MG tablet Take 325 mg by mouth daily with breakfast.      . fish oil-omega-3 fatty acids 1000 MG capsule Take 2 g by mouth daily.      Marland Kitchen glimepiride (AMARYL) 4 MG tablet TAKE ONE TABLET BY MOUTH TWICE DAILY  180 tablet  0  . glucose blood test strip Use as instructed  100 each  12  . Magnesium 500 MG CAPS Take 1 capsule by mouth daily.      . Multiple Vitamins-Minerals (CENTRUM CARDIO) TABS Take 1 tablet by mouth daily.      . Red Yeast Rice 600 MG TABS Take 1 tablet by mouth daily.       No current facility-administered medications for this visit.      Allergies  Allergen Reactions  . Aspirin Other (See Comments)    Nose  bleeds  . Capoten [Captopril] Other (See Comments)    Mouth numbness   . Levemir [Insulin Detemir] Other (See Comments)    Decreased blood sugar  . Micronase [Glyburide]   . Onglyza [Saxagliptin] Other (See Comments)    Elevates blood sugar  . Statins     Numbness in face  . Zocor [Simvastatin] Other (See Comments)    Extreme fatigue    REVIEW OF SYSTEMS: Skin:  No history of rash.  No history of abnormal moles. Infection:  No history of hepatitis or HIV.  No history of MRSA. Neurologic:  No history of stroke.  No history of seizure.  No history of headaches. Cardiac:  HTN x 20 years.  Better with her weight loss.   No history of seeing a cardiologist. Pulmonary:  Does not smoke cigarettes.  No asthma or bronchitis.  No OSA/CPAP.  Endocrine:  DM x 15 years.  On oral hypoglycemics.   No thyroid disease. Gastrointestinal:  See HPI. Urologic:  Remote history of kidney stone - 1981/05/14. (maybe) Musculoskeletal:  No history of joint or back disease. Hematologic:  No bleeding disorder.  No history of anemia.  Not anticoagulated. Psycho-social:  The patient is oriented.   The patient has no obvious psychologic or social impairment to understanding our conversation and plan.  SOCIAL and FAMILY HISTORY: Widowed  (Her husband died in  05-14-96) 3 sons - [name] (works Architect - on [employer]), Two live nearby and one lives in [place]. Accompanied with son, [name], her son. She knows [name] (I took care of her husband, [name], who died in 05-14-2013)  PHYSICAL EXAM: BP 138/82  Pulse 100  Temp(Src) 98 F (36.7 C)  Resp 18  Ht 5\' 4"  (1.626 m)  Wt 146 lb (66.225 kg)  BMI 25.05 kg/m2  General: WN older WF who is alert and generally healthy appearing.  HEENT: Normal. Pupils equal. Neck: Supple. No mass.  No thyroid mass. Lymph Nodes:  No supraclavicular, cervical, axillary, or inguinal nodes. Lungs: Clear to auscultation and symmetric breath sounds. Heart:   RRR. No murmur or rub. Breasts:  Right - no mass, circumareolar incision from prior biopsy  Left - no mass Abdomen: Soft. No mass. No tenderness. No hernia. Normal bowel sounds.  Her spleen is about 5 cm below the left costal margin Rectal: Not done. Extremities:  Good strength and ROM  in upper and lower extremities. Neurologic:  Grossly intact to motor and sensory function. Psychiatric: Has  normal mood and affect. Behavior is normal.   DATA REVIEWED: Epic notes  Alphonsa Overall, Mendez,  [facility] [address] [place]  [place], [place]    [place] Phone:  [place]:  [phone]

## 2013-08-24 ENCOUNTER — Encounter (HOSPITAL_BASED_OUTPATIENT_CLINIC_OR_DEPARTMENT_OTHER): Payer: Self-pay | Admitting: Surgery

## 2013-08-26 ENCOUNTER — Telehealth: Payer: Self-pay | Admitting: *Deleted

## 2013-08-26 ENCOUNTER — Other Ambulatory Visit (HOSPITAL_COMMUNITY): Payer: Self-pay | Admitting: Hematology and Oncology

## 2013-08-26 ENCOUNTER — Encounter (HOSPITAL_COMMUNITY): Payer: Self-pay | Admitting: Pharmacy Technician

## 2013-08-26 ENCOUNTER — Other Ambulatory Visit: Payer: Self-pay | Admitting: Hematology and Oncology

## 2013-08-26 DIAGNOSIS — C8590 Non-Hodgkin lymphoma, unspecified, unspecified site: Secondary | ICD-10-CM

## 2013-08-26 NOTE — Telephone Encounter (Signed)
Informed pt of prelim report and need to proceed w/ BMBx as scheduled tomorrow.  She verbalized understanding.

## 2013-08-26 NOTE — Telephone Encounter (Signed)
Pt states Dr. Lucia Gaskins told her LN Bx results would probably not be back by tomorrow.  Pt asks if she is still to come for BMBX or wait for LN Bx results?

## 2013-08-26 NOTE — Telephone Encounter (Signed)
Prelim is lymphoma. Proceed with BM biopsy tomorrow

## 2013-08-27 ENCOUNTER — Encounter (HOSPITAL_COMMUNITY): Payer: Self-pay

## 2013-08-27 ENCOUNTER — Encounter (HOSPITAL_COMMUNITY)
Admission: RE | Admit: 2013-08-27 | Discharge: 2013-08-27 | Disposition: A | Discharge status: Home / Self Care | Payer: Medicare Other | Source: Ambulatory Visit | Attending: Hematology and Oncology | Admitting: Hematology and Oncology

## 2013-08-27 VITALS — BP 125/64 | HR 91 | Temp 98.0°F | Resp 20

## 2013-08-27 DIAGNOSIS — C8589 Other specified types of non-Hodgkin lymphoma, extranodal and solid organ sites: Secondary | ICD-10-CM | POA: Diagnosis present

## 2013-08-27 DIAGNOSIS — C8590 Non-Hodgkin lymphoma, unspecified, unspecified site: Secondary | ICD-10-CM

## 2013-08-27 DIAGNOSIS — D704 Cyclic neutropenia: Secondary | ICD-10-CM | POA: Diagnosis not present

## 2013-08-27 LAB — CBC WITH DIFFERENTIAL/PLATELET
BASOS ABS: 0 10*3/uL (ref 0.0–0.1)
Basophils Relative: 1 % (ref 0–1)
Eosinophils Absolute: 0.1 10*3/uL (ref 0.0–0.7)
Eosinophils Relative: 3 % (ref 0–5)
HCT: 28.2 % — ABNORMAL LOW (ref 36.0–46.0)
HEMOGLOBIN: 8.8 g/dL — AB (ref 12.0–15.0)
Lymphocytes Relative: 22 % (ref 12–46)
Lymphs Abs: 0.8 10*3/uL (ref 0.7–4.0)
MCH: 23.7 pg — AB (ref 26.0–34.0)
MCHC: 31.2 g/dL (ref 30.0–36.0)
MCV: 76 fL — ABNORMAL LOW (ref 78.0–100.0)
Monocytes Absolute: 0.2 10*3/uL (ref 0.1–1.0)
Monocytes Relative: 7 % (ref 3–12)
NEUTROS ABS: 2.5 10*3/uL (ref 1.7–7.7)
NEUTROS PCT: 67 % (ref 43–77)
PLATELETS: 219 10*3/uL (ref 150–400)
RBC: 3.71 MIL/uL — ABNORMAL LOW (ref 3.87–5.11)
RDW: 19.2 % — AB (ref 11.5–15.5)
WBC: 3.7 10*3/uL — ABNORMAL LOW (ref 4.0–10.5)

## 2013-08-27 LAB — COMPREHENSIVE METABOLIC PANEL
ALBUMIN: 2.9 g/dL — AB (ref 3.5–5.2)
ALK PHOS: 134 U/L — AB (ref 39–117)
ALT: 16 U/L (ref 0–35)
AST: 18 U/L (ref 0–37)
Anion gap: 15 (ref 5–15)
BILIRUBIN TOTAL: 0.4 mg/dL (ref 0.3–1.2)
BUN: 16 mg/dL (ref 6–23)
CHLORIDE: 100 meq/L (ref 96–112)
CO2: 22 mEq/L (ref 19–32)
Calcium: 9.4 mg/dL (ref 8.4–10.5)
Creatinine, Ser: 0.87 mg/dL (ref 0.50–1.10)
GFR calc Af Amer: 72 mL/min — ABNORMAL LOW (ref >90)
GFR calc non Af Amer: 62 mL/min — ABNORMAL LOW (ref >90)
Glucose, Bld: 180 mg/dL — ABNORMAL HIGH (ref 70–99)
POTASSIUM: 4.3 meq/L (ref 3.7–5.3)
Sodium: 137 mEq/L (ref 137–147)
Total Protein: 7.3 g/dL (ref 6.0–8.3)

## 2013-08-27 LAB — HEPATITIS B SURFACE ANTIGEN: HEP B S AG: NEGATIVE

## 2013-08-27 LAB — GLUCOSE, CAPILLARY: Glucose-Capillary: 161 mg/dL — ABNORMAL HIGH (ref 70–99)

## 2013-08-27 LAB — BONE MARROW EXAM

## 2013-08-27 LAB — LACTATE DEHYDROGENASE: LDH: 163 U/L (ref 94–250)

## 2013-08-27 LAB — HEPATITIS B CORE ANTIBODY, IGM: HEP B C IGM: NONREACTIVE

## 2013-08-27 LAB — HEPATITIS B SURFACE ANTIBODY,QUALITATIVE: Hep B S Ab: NEGATIVE

## 2013-08-27 MED ORDER — SODIUM CHLORIDE 0.9 % IV SOLN
Freq: Once | INTRAVENOUS | Status: AC
  Administered 2013-08-27: 07:00:00 via INTRAVENOUS

## 2013-08-27 MED ORDER — MIDAZOLAM HCL 2 MG/2ML IJ SOLN
INTRAMUSCULAR | Status: AC | PRN
  Administered 2013-08-27: 4 mg via INTRAVENOUS

## 2013-08-27 MED ORDER — MIDAZOLAM HCL 10 MG/2ML IJ SOLN
10.0000 mg | Freq: Once | INTRAMUSCULAR | Status: DC
  Filled 2013-08-27: qty 2

## 2013-08-27 MED ORDER — FENTANYL CITRATE 0.05 MG/ML IJ SOLN
100.0000 ug | Freq: Once | INTRAMUSCULAR | Status: DC
  Filled 2013-08-27: qty 2

## 2013-08-27 MED ORDER — FENTANYL CITRATE 0.05 MG/ML IJ SOLN
INTRAMUSCULAR | Status: AC | PRN
  Administered 2013-08-27: 25 ug via INTRAVENOUS

## 2013-08-27 NOTE — Discharge Instructions (Signed)
Bone Marrow Aspiration, Bone Marrow Biopsy °Care After °Read the instructions outlined below and refer to this sheet in the next few weeks. These discharge instructions provide you with general information on caring for yourself after you leave the hospital. Your caregiver may also give you specific instructions. While your treatment has been planned according to the most current medical practices available, unavoidable complications occasionally occur. If you have any problems or questions after discharge, call your caregiver. °FINDING OUT THE RESULTS OF YOUR TEST °Not all test results are available during your visit. If your test results are not back during the visit, make an appointment with your caregiver to find out the results. Do not assume everything is normal if you have not heard from your caregiver or the medical facility. It is important for you to follow up on all of your test results.  °HOME CARE INSTRUCTIONS  °You have had sedation and may be sleepy or dizzy. Your thinking may not be as clear as usual. For the next 24 hours: °· Only take over-the-counter or prescription medicines for pain, discomfort, and or fever as directed by your caregiver. °· Do not drink alcohol. °· Do not smoke. °· Do not drive. °· Do not make important legal decisions. °· Do not operate heavy machinery. °· Do not care for small children by yourself. °· Keep your dressing clean and dry. You may replace dressing with a bandage after 24 hours. °· You may take a bath or shower after 24 hours. °· Use an ice pack for 20 minutes every 2 hours while awake for pain as needed. °SEEK MEDICAL CARE IF:  °· There is redness, swelling, or increasing pain at the biopsy site. °· There is pus coming from the biopsy site. °· There is drainage from a biopsy site lasting longer than one day. °· An unexplained oral temperature above 102° F (38.9° C) develops. °SEEK IMMEDIATE MEDICAL CARE IF:  °· You develop a rash. °· You have difficulty  breathing. °· You develop any reaction or side effects to medications given. °Document Released: 08/03/2004 Document Revised: 04/08/2011 Document Reviewed: 01/12/2008 °ExitCare® Patient Information ©2015 [employer]. This information is not intended to replace advice given to you by your health care provider. Make sure you discuss any questions you have with your health care provider. °Conscious Sedation, Adult, Care After °Refer to this sheet in the next few weeks. These instructions provide you with information on caring for yourself after your procedure. Your health care provider may also give you more specific instructions. Your treatment has been planned according to current medical practices, but problems sometimes occur. Call your health care provider if you have any problems or questions after your procedure. °WHAT TO EXPECT AFTER THE PROCEDURE  °After your procedure: °· You may feel sleepy, clumsy, and have poor balance for several hours. °· Vomiting may occur if you eat too soon after the procedure. °HOME CARE INSTRUCTIONS °· Do not participate in any activities where you could become injured for at least 24 hours. Do not: °¨ Drive. °¨ Swim. °¨ Ride a bicycle. °¨ Operate heavy machinery. °¨ Cook. °¨ Use power tools. °¨ Climb ladders. °¨ Work from a high place. °· Do not make important decisions or sign legal documents until you are improved. °· If you vomit, drink water, juice, or soup when you can drink without vomiting. Make sure you have little or no nausea before eating solid foods. °· Only take over-the-counter or prescription medicines for pain, discomfort, or fever   as directed by your health care provider.  Make sure you and your family fully understand everything about the medicines given to you, including what side effects may occur.  You should not drink alcohol, take sleeping pills, or take medicines that cause drowsiness for at least 24 hours.  If you smoke, do not smoke without  supervision.  If you are feeling better, you may resume normal activities 24 hours after you were sedated.  Keep all appointments with your health care provider. SEEK MEDICAL CARE IF:  Your skin is pale or bluish in color.  You continue to feel nauseous or vomit.  Your pain is getting worse and is not helped by medicine.  You have bleeding or swelling.  You are still sleepy or feeling clumsy after 24 hours. SEEK IMMEDIATE MEDICAL CARE IF:  You develop a rash.  You have difficulty breathing.  You develop any type of allergic problem.  You have a fever. MAKE SURE YOU:  Understand these instructions.  Will watch your condition.  Will get help right away if you are not doing well or get worse. Document Released: 11/04/2012 Document Reviewed: 11/04/2012 [facility] Patient Information 2015 [place], [place]. This information is not intended to replace advice given to you by your health care provider. Make sure you discuss any questions you have with your health care provider.

## 2013-08-27 NOTE — Procedures (Signed)
Brief examination was performed. ENT: adequate airway clearance Heart: regular rate and rhythm.No Murmurs Lungs: clear to auscultation, no wheezes, normal respiratory effort  American Society of Anesthesiologists ASA scale 2  Mallampati Score of 2  Bone Marrow Biopsy and Aspiration Procedure Note   Informed consent was obtained and potential risks including bleeding, infection and pain were reviewed with the patient. I verified that the patient has been fasting since midnight.  The patient's name, date of birth, identification, consent and allergies were verified prior to the start of procedure and time out was performed.  A total of 4 mg of IV Versed and 25 mcg of IV fentanyl were given.  The right posterior iliac crest was chosen as the site of biopsy.  The skin was prepped with Betadine solution.   8 cc of 1% lidocaine was used to provide local anaesthesia.   10 cc of bone marrow aspirate was obtained followed by 1 inch biopsy.   The procedure was tolerated well and there were no complications.  The patient was stable at the end of the procedure.  Specimens sent for flow cytometry, cytogenetics and additional studies.

## 2013-08-30 ENCOUNTER — Telehealth (INDEPENDENT_AMBULATORY_CARE_PROVIDER_SITE_OTHER): Payer: Self-pay | Admitting: *Deleted

## 2013-08-30 NOTE — Telephone Encounter (Signed)
Pt called regarding an appt to have her sutures removed from her procedure she had, LEFT AXILLARY LYMPH NODE BIOPSY, 08-23-13.  She wanted to make an appt after 09-07-13 as she said that's when Dr. Lucia Gaskins told her to make the appt.  We need an order to remove sutures per protocol and I will call pt and schedule her to come in for suture removal. Please advise!  Thanks!  Anderson Malta

## 2013-09-02 ENCOUNTER — Telehealth: Payer: Self-pay | Admitting: Hematology and Oncology

## 2013-09-02 ENCOUNTER — Encounter: Payer: Self-pay | Admitting: Hematology and Oncology

## 2013-09-02 ENCOUNTER — Ambulatory Visit (HOSPITAL_BASED_OUTPATIENT_CLINIC_OR_DEPARTMENT_OTHER): Payer: Medicare Other | Admitting: Hematology and Oncology

## 2013-09-02 ENCOUNTER — Telehealth: Payer: Self-pay | Admitting: *Deleted

## 2013-09-02 VITALS — BP 148/60 | HR 94 | Temp 98.3°F | Resp 18 | Ht 64.0 in | Wt 145.1 lb

## 2013-09-02 DIAGNOSIS — D72819 Decreased white blood cell count, unspecified: Secondary | ICD-10-CM | POA: Insufficient documentation

## 2013-09-02 DIAGNOSIS — C8589 Other specified types of non-Hodgkin lymphoma, extranodal and solid organ sites: Secondary | ICD-10-CM

## 2013-09-02 DIAGNOSIS — D630 Anemia in neoplastic disease: Secondary | ICD-10-CM | POA: Insufficient documentation

## 2013-09-02 DIAGNOSIS — C8580 Other specified types of non-Hodgkin lymphoma, unspecified site: Secondary | ICD-10-CM

## 2013-09-02 HISTORY — DX: Other specified types of non-hodgkin lymphoma, unspecified site: C85.80

## 2013-09-02 MED ORDER — PREDNISONE 20 MG PO TABS: 60.0000 mg | ORAL_TABLET | Freq: Every day | ORAL | Status: DC

## 2013-09-02 NOTE — Assessment & Plan Note (Signed)
This is likely anemia of chronic disease. The patient denies recent history of bleeding such as epistaxis, hematuria or hematochezia. She is asymptomatic from the anemia. We will observe for now.  She does not require transfusion now.   

## 2013-09-02 NOTE — Assessment & Plan Note (Signed)
This is likely related to her disease. She is not symptomatic.

## 2013-09-02 NOTE — Assessment & Plan Note (Signed)
I reviewed all the test results with the patient and her son. We reviewed the current national lines in terms of treatment options. I recommend single agent rituximab. We discussed the role of chemotherapy. The intent is for palliative.  We discussed some of the risks, benefits, side-effects of Rituximab.  Some of the short term side-effects included, though not limited to, risk of fatigue, pancytopenia, life-threatening infections, allergic reactions, nausea, vomiting, sores in the mouth, changes in bowel habits especially diarrhea, admission to hospital for various reasons, and risks of death.   The patient is aware that the response rates discussed earlier is not guaranteed.    After a long discussion, patient made an informed decision to proceed with the prescribed plan of care.   Patient education material was dispensed.

## 2013-09-02 NOTE — Telephone Encounter (Signed)
Pt confirmed labs/ov per 08/06 [facility], sent msg to add chemo and gave pt AVS......KJ

## 2013-09-02 NOTE — Progress Notes (Signed)
[facility] OFFICE PROGRESS NOTE  Patient Care Team: Aretta Nip, MD as PCP - General (Family Medicine) Fabio Pierce, MD as Consulting Physician (Ophthalmology) Inda Castle, MD as Consulting Physician (Gastroenterology) Cammie Sickle, MD as Consulting Physician (Orthopedic Surgery) Jannette Spanner, MD as Referring Physician (Dermatology) Heath Lark, MD as Consulting Physician (Hematology and Oncology) Shann Medal, MD as Consulting Physician (General Surgery)  SUMMARY OF ONCOLOGIC HISTORY: Oncology History   Marginal zone lymphoma, with lymph node and bone marrow involvement along with splenomegaly   Primary site: Lymphoid Neoplasms   Staging method: AJCC 6th Edition   Clinical: Stage IV signed by Heath Lark, MD on 09/02/2013  7:38 PM   Summary: Stage IV       Marginal zone lymphoma   07/29/2013 Imaging CT scan shows splenomegaly and abnormal lymphadenopathy.   08/13/2013 Imaging PET CT scan confirmed lymphadenopathy and splenomegaly, those areas are PET avid   08/23/2013 Surgery [id number] left axillary lymph node biopsy confirmed a low-grade lymphoma.   08/27/2013 Bone Marrow Biopsy Bone marrow biopsy confirmed lymphoma.[id number]    INTERVAL HISTORY: Please see below for problem oriented charting. She returns today to followup on test results. She has fatigue. Appetite remained poor.  REVIEW OF SYSTEMS:   Constitutional: Denies fevers, chills or abnormal weight loss Eyes: Denies blurriness of vision Ears, nose, mouth, throat, and face: Denies mucositis or sore throat Respiratory: Denies cough, dyspnea or wheezes Cardiovascular: Denies palpitation, chest discomfort or lower extremity swelling Gastrointestinal:  Denies nausea, heartburn or change in bowel habits Skin: Denies abnormal skin rashes Lymphatics: Denies new lymphadenopathy or easy bruising Neurological:Denies numbness, tingling or new weaknesses Behavioral/Psych: Mood is stable, no  new changes  All other systems were reviewed with the patient and are negative.  I have reviewed the past medical history, past surgical history, social history and family history with the patient and they are unchanged from previous note.  ALLERGIES:  is allergic to aspirin; capoten; levemir; micronase; onglyza; statins; and zocor.  MEDICATIONS:  Current Outpatient Prescriptions  Medication Sig Dispense Refill  . Calcium Carbonate-Vit D-Min (CALCIUM 1200 PO) Take 1 tablet by mouth daily.      . cholecalciferol (VITAMIN D) 1000 UNITS tablet Take 1,000 Units by mouth every Monday, Wednesday, and Friday. Three times per week      . Cinnamon 500 MG capsule Take 500 mg by mouth daily.      . enalapril (VASOTEC) 20 MG tablet Take 20 mg by mouth every morning.      . fish oil-omega-3 fatty acids 1000 MG capsule Take 1 g by mouth daily.       Marland Kitchen glimepiride (AMARYL) 4 MG tablet Take 4 mg by mouth daily with breakfast.      . glucose blood test strip Use as instructed  100 each  12  . HYDROcodone-acetaminophen (NORCO/VICODIN) 5-325 MG per tablet Take 1 tablet by mouth every 6 (six) hours as needed for moderate pain.      . Magnesium 500 MG CAPS Take 500 mg by mouth daily.       . metFORMIN (GLUCOPHAGE) 1000 MG tablet Take 1,000 mg by mouth daily with breakfast.      . Multiple Vitamins-Minerals (CENTRUM CARDIO) TABS Take 1 tablet by mouth daily.      . Red Yeast Rice 600 MG TABS Take 600 mg by mouth daily.       . predniSONE (DELTASONE) 20 MG tablet Take 3 tablets (60 mg total)  by mouth daily with breakfast.  9 tablet  0   No current facility-administered medications for this visit.    PHYSICAL EXAMINATION: ECOG PERFORMANCE STATUS: 1 - Symptomatic but completely ambulatory  Filed Vitals:   09/02/13 1332  BP: 148/60  Pulse: 94  Temp: 98.3 F (36.8 C)  Resp: 18   Filed Weights   09/02/13 1332  Weight: 145 lb 1.6 oz (65.817 kg)    GENERAL:alert, no distress and comfortable. She looks  thin and mildly cachectic SKIN: skin color, texture, turgor are normal, no rashes or significant lesions EYES: normal, Conjunctiva are pink and non-injected, sclera clear Musculoskeletal:no cyanosis of digits and no clubbing  NEURO: alert & oriented x 3 with fluent speech, no focal motor/sensory deficits  LABORATORY DATA:  I have reviewed the data as listed    Component Value Date/Time   NA 137 08/27/2013 0710   NA 138 07/29/2013 0933   K 4.3 08/27/2013 0710   K 5.0 07/29/2013 0933   CL 100 08/27/2013 0710   CO2 22 08/27/2013 0710   CO2 24 07/29/2013 0933   GLUCOSE 180* 08/27/2013 0710   GLUCOSE 263* 07/29/2013 0933   BUN 16 08/27/2013 0710   BUN 17.6 07/29/2013 0933   CREATININE 0.87 08/27/2013 0710   CREATININE 1.1 07/29/2013 0933   CREATININE 1.12* 05/18/2013 1256   CALCIUM 9.4 08/27/2013 0710   CALCIUM 10.1 07/29/2013 0933   PROT 7.3 08/27/2013 0710   ALBUMIN 2.9* 08/27/2013 0710   AST 18 08/27/2013 0710   ALT 16 08/27/2013 0710   ALKPHOS 134* 08/27/2013 0710   BILITOT 0.4 08/27/2013 0710   GFRNONAA 62* 08/27/2013 0710   GFRNONAA 47* 05/18/2013 1256   GFRAA 72* 08/27/2013 0710   GFRAA 55* 05/18/2013 1256    No results found for this basename: SPEP, UPEP,  kappa and lambda light chains    Lab Results  Component Value Date   WBC 3.7* 08/27/2013   NEUTROABS 2.5 08/27/2013   HGB 8.8* 08/27/2013   HCT 28.2* 08/27/2013   MCV 76.0* 08/27/2013   PLT 219 08/27/2013      Chemistry      Component Value Date/Time   NA 137 08/27/2013 0710   NA 138 07/29/2013 0933   K 4.3 08/27/2013 0710   K 5.0 07/29/2013 0933   CL 100 08/27/2013 0710   CO2 22 08/27/2013 0710   CO2 24 07/29/2013 0933   BUN 16 08/27/2013 0710   BUN 17.6 07/29/2013 0933   CREATININE 0.87 08/27/2013 0710   CREATININE 1.1 07/29/2013 0933   CREATININE 1.12* 05/18/2013 1256      Component Value Date/Time   CALCIUM 9.4 08/27/2013 0710   CALCIUM 10.1 07/29/2013 0933   ALKPHOS 134* 08/27/2013 0710   AST 18 08/27/2013 0710   ALT 16 08/27/2013 0710   BILITOT  0.4 08/27/2013 0710     ASSESSMENT & PLAN:  Marginal zone lymphoma I reviewed all the test results with the patient and her son. We reviewed the current national lines in terms of treatment options. I recommend single agent rituximab. We discussed the role of chemotherapy. The intent is for palliative.  We discussed some of the risks, benefits, side-effects of Rituximab.  Some of the short term side-effects included, though not limited to, risk of fatigue, pancytopenia, life-threatening infections, allergic reactions, nausea, vomiting, sores in the mouth, changes in bowel habits especially diarrhea, admission to hospital for various reasons, and risks of death.   The patient is aware that the response  rates discussed earlier is not guaranteed.    After a long discussion, patient made an informed decision to proceed with the prescribed plan of care.   Patient education material was dispensed.   Leukopenia This is likely related to her disease. She is not symptomatic.  Anemia in neoplastic disease This is likely anemia of chronic disease. The patient denies recent history of bleeding such as epistaxis, hematuria or hematochezia. She is asymptomatic from the anemia. We will observe for now.  She does not require transfusion now.     Orders Placed This Encounter  Procedures  . Hepatitis C antibody    Standing Status: Future     Number of Occurrences:      Standing Expiration Date: 09/02/2014  . CBC with Differential    Standing Status: Standing     Number of Occurrences: 9     Standing Expiration Date: 09/03/2014  . Comprehensive metabolic panel    Standing Status: Standing     Number of Occurrences: 9     Standing Expiration Date: 09/03/2014  . Lactate dehydrogenase    Standing Status: Standing     Number of Occurrences: 9     Standing Expiration Date: 09/03/2014   All questions were answered. The patient knows to call the clinic with any problems, questions or concerns. No  barriers to learning was detected. I spent 30 minutes counseling the patient face to face. The total time spent in the appointment was 40 minutes and more than 50% was on counseling and review of test results     [facility], [place], MD 09/02/2013 7:47 PM

## 2013-09-02 NOTE — Telephone Encounter (Signed)
Per [facility] staff message scheduled appts. Advised scheduler 

## 2013-09-02 NOTE — Patient Instructions (Signed)

## 2013-09-03 ENCOUNTER — Telehealth: Payer: Self-pay | Admitting: *Deleted

## 2013-09-03 NOTE — Telephone Encounter (Signed)
Per [facility] staff message scheduled appts. Advised scheduler 

## 2013-09-06 ENCOUNTER — Other Ambulatory Visit: Payer: Medicare Other

## 2013-09-07 LAB — CHROMOSOME ANALYSIS, BONE MARROW

## 2013-09-07 LAB — TISSUE HYBRIDIZATION (BONE MARROW)-NCBH

## 2013-09-08 ENCOUNTER — Ambulatory Visit (HOSPITAL_BASED_OUTPATIENT_CLINIC_OR_DEPARTMENT_OTHER): Payer: Medicare Other

## 2013-09-08 ENCOUNTER — Other Ambulatory Visit: Payer: Self-pay | Admitting: Hematology and Oncology

## 2013-09-08 VITALS — BP 133/50 | HR 99 | Temp 98.2°F | Resp 18

## 2013-09-08 DIAGNOSIS — C8580 Other specified types of non-Hodgkin lymphoma, unspecified site: Secondary | ICD-10-CM

## 2013-09-08 DIAGNOSIS — C8589 Other specified types of non-Hodgkin lymphoma, extranodal and solid organ sites: Secondary | ICD-10-CM

## 2013-09-08 DIAGNOSIS — Z5112 Encounter for antineoplastic immunotherapy: Secondary | ICD-10-CM

## 2013-09-08 LAB — COMPREHENSIVE METABOLIC PANEL (CC13)
ALK PHOS: 123 U/L (ref 40–150)
ALT: 14 U/L (ref 0–55)
AST: 12 U/L (ref 5–34)
Albumin: 3.1 g/dL — ABNORMAL LOW (ref 3.5–5.0)
Anion Gap: 18 mEq/L — ABNORMAL HIGH (ref 3–11)
BILIRUBIN TOTAL: 0.38 mg/dL (ref 0.20–1.20)
BUN: 37.7 mg/dL — AB (ref 7.0–26.0)
CO2: 22 mEq/L (ref 22–29)
CREATININE: 1.3 mg/dL — AB (ref 0.6–1.1)
Calcium: 10 mg/dL (ref 8.4–10.4)
Chloride: 101 mEq/L (ref 98–109)
GLUCOSE: 181 mg/dL — AB (ref 70–140)
Potassium: 4.3 mEq/L (ref 3.5–5.1)
Sodium: 142 mEq/L (ref 136–145)
Total Protein: 7.5 g/dL (ref 6.4–8.3)

## 2013-09-08 LAB — CBC WITH DIFFERENTIAL/PLATELET
BASO%: 0.6 % (ref 0.0–2.0)
BASOS ABS: 0 10*3/uL (ref 0.0–0.1)
EOS ABS: 0 10*3/uL (ref 0.0–0.5)
EOS%: 0.4 % (ref 0.0–7.0)
HCT: 29 % — ABNORMAL LOW (ref 34.8–46.6)
HEMOGLOBIN: 9.3 g/dL — AB (ref 11.6–15.9)
LYMPH%: 22.8 % (ref 14.0–49.7)
MCH: 24.1 pg — AB (ref 25.1–34.0)
MCHC: 32 g/dL (ref 31.5–36.0)
MCV: 75.3 fL — ABNORMAL LOW (ref 79.5–101.0)
MONO#: 0.4 10*3/uL (ref 0.1–0.9)
MONO%: 8.1 % (ref 0.0–14.0)
NEUT%: 68.1 % (ref 38.4–76.8)
NEUTROS ABS: 3.6 10*3/uL (ref 1.5–6.5)
PLATELETS: 282 10*3/uL (ref 145–400)
RBC: 3.85 10*6/uL (ref 3.70–5.45)
RDW: 21.7 % — AB (ref 11.2–14.5)
WBC: 5.3 10*3/uL (ref 3.9–10.3)
lymph#: 1.2 10*3/uL (ref 0.9–3.3)

## 2013-09-08 LAB — HEPATITIS C ANTIBODY: HCV Ab: NEGATIVE

## 2013-09-08 LAB — LACTATE DEHYDROGENASE (CC13): LDH: 156 U/L (ref 125–245)

## 2013-09-08 MED ORDER — DIPHENHYDRAMINE HCL 50 MG/ML IJ SOLN
25.0000 mg | Freq: Once | INTRAMUSCULAR | Status: AC | PRN
  Administered 2013-09-08: 25 mg via INTRAVENOUS

## 2013-09-08 MED ORDER — LOPERAMIDE HCL 2 MG PO CAPS
4.0000 mg | ORAL_CAPSULE | Freq: Once | ORAL | Status: AC
  Administered 2013-09-08: 4 mg via ORAL

## 2013-09-08 MED ORDER — DIPHENHYDRAMINE HCL 25 MG PO CAPS
ORAL_CAPSULE | ORAL | Status: AC
  Filled 2013-09-08: qty 2

## 2013-09-08 MED ORDER — METHYLPREDNISOLONE SODIUM SUCC 125 MG IJ SOLR
125.0000 mg | Freq: Once | INTRAMUSCULAR | Status: AC | PRN
  Administered 2013-09-08: 125 mg via INTRAVENOUS

## 2013-09-08 MED ORDER — ACETAMINOPHEN 325 MG PO TABS
ORAL_TABLET | ORAL | Status: AC
  Filled 2013-09-08: qty 2

## 2013-09-08 MED ORDER — LOPERAMIDE HCL 2 MG PO CAPS
ORAL_CAPSULE | ORAL | Status: AC
  Filled 2013-09-08: qty 2

## 2013-09-08 MED ORDER — SODIUM CHLORIDE 0.9 % IV SOLN
375.0000 mg/m2 | Freq: Once | INTRAVENOUS | Status: AC
  Administered 2013-09-08: 600 mg via INTRAVENOUS
  Filled 2013-09-08: qty 60

## 2013-09-08 MED ORDER — DIPHENHYDRAMINE HCL 25 MG PO CAPS
50.0000 mg | ORAL_CAPSULE | Freq: Once | ORAL | Status: AC
  Administered 2013-09-08: 50 mg via ORAL

## 2013-09-08 MED ORDER — FAMOTIDINE IN NACL 20-0.9 MG/50ML-% IV SOLN
20.0000 mg | Freq: Once | INTRAVENOUS | Status: AC | PRN
  Administered 2013-09-08: 20 mg via INTRAVENOUS

## 2013-09-08 MED ORDER — SODIUM CHLORIDE 0.9 % IV SOLN
Freq: Once | INTRAVENOUS | Status: AC
  Administered 2013-09-08: 09:00:00 via INTRAVENOUS

## 2013-09-08 MED ORDER — ACETAMINOPHEN 325 MG PO TABS
650.0000 mg | ORAL_TABLET | Freq: Once | ORAL | Status: AC
  Administered 2013-09-08: 650 mg via ORAL

## 2013-09-08 NOTE — Patient Instructions (Signed)
[facility] Discharge Instructions for Patients Receiving Chemotherapy  Today you received the following chemotherapy agents: Rituxan To help prevent nausea and vomiting after your treatment, we encourage you to take your nausea medication as prescribed.   If you develop nausea and vomiting that is not controlled by your nausea medication, call the clinic.   BELOW ARE SYMPTOMS THAT SHOULD BE REPORTED IMMEDIATELY:  *FEVER GREATER THAN 100.5 F  *CHILLS WITH OR WITHOUT FEVER  NAUSEA AND VOMITING THAT IS NOT CONTROLLED WITH YOUR NAUSEA MEDICATION  *UNUSUAL SHORTNESS OF BREATH  *UNUSUAL BRUISING OR BLEEDING  TENDERNESS IN MOUTH AND THROAT WITH OR WITHOUT PRESENCE OF ULCERS  *URINARY PROBLEMS  *BOWEL PROBLEMS  UNUSUAL RASH Items with * indicate a potential emergency and should be followed up as soon as possible.  Feel free to call the clinic you have any questions or concerns. The clinic phone number is [phone].   Rituximab injection What is this medicine? RITUXIMAB (ri TUX i mab) is a monoclonal antibody. This medicine changes the way the body's immune system works. It is used commonly to treat non-Hodgkin's lymphoma and other conditions. In cancer cells, this drug targets a specific protein within cancer cells and stops the cancer cells from growing. It is also used to treat rhuematoid arthritis (RA). In RA, this medicine slow the inflammatory process and help reduce joint pain and swelling. This medicine is often used with other cancer or arthritis medications. This medicine may be used for other purposes; ask your health care provider or pharmacist if you have questions. COMMON BRAND NAME(S): Rituxan What should I tell my health care provider before I take this medicine? They need to know if you have any of these conditions: -blood disorders -heart disease -history of hepatitis B -infection (especially a virus infection such as chickenpox, cold sores, or  herpes) -irregular heartbeat -kidney disease -lung or breathing disease, like asthma -lupus -an unusual or allergic reaction to rituximab, mouse proteins, other medicines, foods, dyes, or preservatives -pregnant or trying to get pregnant -breast-feeding How should I use this medicine? This medicine is for infusion into a vein. It is administered in a hospital or clinic by a specially trained health care professional. A special MedGuide will be given to you by the pharmacist with each prescription and refill. Be sure to read this information carefully each time. Talk to your pediatrician regarding the use of this medicine in children. This medicine is not approved for use in children. Overdosage: If you think you have taken too much of this medicine contact a poison control center or emergency room at once. NOTE: This medicine is only for you. Do not share this medicine with others. What if I miss a dose? It is important not to miss a dose. Call your doctor or health care professional if you are unable to keep an appointment. What may interact with this medicine? -cisplatin -medicines for blood pressure -some other medicines for arthritis -vaccines This list may not describe all possible interactions. Give your health care provider a list of all the medicines, herbs, non-prescription drugs, or dietary supplements you use. Also tell them if you smoke, drink alcohol, or use illegal drugs. Some items may interact with your medicine. What should I watch for while using this medicine? Report any side effects that you notice during your treatment right away, such as changes in your breathing, fever, chills, dizziness or lightheadedness. These effects are more common with the first dose. Visit your prescriber or health  care professional for checks on your progress. You will need to have regular blood work. Report any other side effects. The side effects of this medicine can continue after you finish  your treatment. Continue your course of treatment even though you feel ill unless your doctor tells you to stop. Call your doctor or health care professional for advice if you get a fever, chills or sore throat, or other symptoms of a cold or flu. Do not treat yourself. This drug decreases your body's ability to fight infections. Try to avoid being around people who are sick. This medicine may increase your risk to bruise or bleed. Call your doctor or health care professional if you notice any unusual bleeding. Be careful brushing and flossing your teeth or using a toothpick because you may get an infection or bleed more easily. If you have any dental work done, tell your dentist you are receiving this medicine. Avoid taking products that contain aspirin, acetaminophen, ibuprofen, naproxen, or ketoprofen unless instructed by your doctor. These medicines may hide a fever. Do not become pregnant while taking this medicine. Women should inform their doctor if they wish to become pregnant or think they might be pregnant. There is a potential for serious side effects to an unborn child. Talk to your health care professional or pharmacist for more information. Do not breast-feed an infant while taking this medicine. What side effects may I notice from receiving this medicine? Side effects that you should report to your doctor or health care professional as soon as possible: -allergic reactions like skin rash, itching or hives, swelling of the face, lips, or tongue -low blood counts - this medicine may decrease the number of white blood cells, red blood cells and platelets. You may be at increased risk for infections and bleeding. -signs of infection - fever or chills, cough, sore throat, pain or difficulty passing urine -signs of decreased platelets or bleeding - bruising, pinpoint red spots on the skin, black, tarry stools, blood in the urine -signs of decreased red blood cells - unusually weak or tired,  fainting spells, lightheadedness -breathing problems -confused, not responsive -chest pain -fast, irregular heartbeat -feeling faint or lightheaded, falls -mouth sores -redness, blistering, peeling or loosening of the skin, including inside the mouth -stomach pain -swelling of the ankles, feet, or hands -trouble passing urine or change in the amount of urine Side effects that usually do not require medical attention (report to your doctor or other health care professional if they continue or are bothersome): -anxiety -headache -loss of appetite -muscle aches -nausea -night sweats This list may not describe all possible side effects. Call your doctor for medical advice about side effects. You may report side effects to FDA at 1-800-FDA-1088. Where should I keep my medicine? This drug is given in a hospital or clinic and will not be stored at home. NOTE: This sheet is a summary. It may not cover all possible information. If you have questions about this medicine, talk to your doctor, pharmacist, or health care provider.  2015, Elsevier/Gold Standard. (2007-09-14 14:04:59)

## 2013-09-08 NOTE — Progress Notes (Signed)
1045 - Patient states she is having mild lower back pain. Reposition patient and she states it is better.  1100 - Patient complaining of being "very cold and shaking" and increased back pain. Rituxan paused and NS running. Dr. Alvy Bimler notified. Received order to give 25mg  IV Benadryl, 125mg  IV Solumedrol, and 20mg  IV Pepcid.  1140- Dr. Alvy Bimler saw patient in infusion area. Received orders to restart rituxan.  1145 - Rituxan restarted at lower rate of 36ml/hr. Upon restarting, patient states symptoms had completely resolved. 1445- Patient completed remainder of infusion without any further problems or complications. Pt discharged home with son and given copy of AVS.

## 2013-09-09 ENCOUNTER — Encounter (INDEPENDENT_AMBULATORY_CARE_PROVIDER_SITE_OTHER): Payer: Medicare Other

## 2013-09-09 ENCOUNTER — Telehealth: Payer: Self-pay | Admitting: *Deleted

## 2013-09-09 NOTE — Telephone Encounter (Signed)
Message copied by Cherylynn Ridges on Thu Sep 09, 2013  1:15 PM ------      Message from: Christa See      Created: Wed Sep 08, 2013  2:57 PM      Regarding: 1st time rituxan      Contact: [phone]       First time rituxan on Wednesday. MD is Alvy Bimler. Pt reacted halfway through but did fine finishing. Thanks! Kristen  ------

## 2013-09-09 NOTE — Telephone Encounter (Signed)
Called Jamie Mendez for chemotherapy F/U.  Patient is doing well.  Denies n/v.  Denies any new side effects or symptoms.  Bowel and bladder is functioning well.  Eating and drinking well and I instructed to drink 64 oz minimum daily or at least the day before, of and after treatment.  Denies questions at this time and encouraged to call if needed.  Reviewed how to call after hours in the case of an emergency.

## 2013-09-15 ENCOUNTER — Ambulatory Visit: Payer: Medicare Other

## 2013-09-15 ENCOUNTER — Other Ambulatory Visit (HOSPITAL_BASED_OUTPATIENT_CLINIC_OR_DEPARTMENT_OTHER): Payer: Medicare Other

## 2013-09-15 ENCOUNTER — Ambulatory Visit (HOSPITAL_BASED_OUTPATIENT_CLINIC_OR_DEPARTMENT_OTHER): Payer: Medicare Other

## 2013-09-15 VITALS — BP 145/61 | HR 87 | Temp 97.9°F | Resp 16

## 2013-09-15 DIAGNOSIS — C8580 Other specified types of non-Hodgkin lymphoma, unspecified site: Secondary | ICD-10-CM

## 2013-09-15 DIAGNOSIS — C8589 Other specified types of non-Hodgkin lymphoma, extranodal and solid organ sites: Secondary | ICD-10-CM

## 2013-09-15 DIAGNOSIS — Z5112 Encounter for antineoplastic immunotherapy: Secondary | ICD-10-CM

## 2013-09-15 LAB — CBC WITH DIFFERENTIAL/PLATELET
BASO%: 0.6 % (ref 0.0–2.0)
Basophils Absolute: 0 10*3/uL (ref 0.0–0.1)
EOS ABS: 0.1 10*3/uL (ref 0.0–0.5)
EOS%: 1.9 % (ref 0.0–7.0)
HCT: 31.6 % — ABNORMAL LOW (ref 34.8–46.6)
HGB: 10.2 g/dL — ABNORMAL LOW (ref 11.6–15.9)
LYMPH%: 23.2 % (ref 14.0–49.7)
MCH: 24.5 pg — ABNORMAL LOW (ref 25.1–34.0)
MCHC: 32.3 g/dL (ref 31.5–36.0)
MCV: 76 fL — ABNORMAL LOW (ref 79.5–101.0)
MONO#: 0.4 10*3/uL (ref 0.1–0.9)
MONO%: 6.7 % (ref 0.0–14.0)
NEUT#: 3.6 10*3/uL (ref 1.5–6.5)
NEUT%: 67.6 % (ref 38.4–76.8)
PLATELETS: 124 10*3/uL — AB (ref 145–400)
RBC: 4.16 10*6/uL (ref 3.70–5.45)
RDW: 19.1 % — AB (ref 11.2–14.5)
WBC: 5.3 10*3/uL (ref 3.9–10.3)
lymph#: 1.2 10*3/uL (ref 0.9–3.3)

## 2013-09-15 LAB — COMPREHENSIVE METABOLIC PANEL (CC13)
ALBUMIN: 3.2 g/dL — AB (ref 3.5–5.0)
ALT: 14 U/L (ref 0–55)
ANION GAP: 10 meq/L (ref 3–11)
AST: 18 U/L (ref 5–34)
Alkaline Phosphatase: 113 U/L (ref 40–150)
BUN: 35.6 mg/dL — ABNORMAL HIGH (ref 7.0–26.0)
CO2: 24 meq/L (ref 22–29)
Calcium: 9.9 mg/dL (ref 8.4–10.4)
Chloride: 102 mEq/L (ref 98–109)
Creatinine: 1.3 mg/dL — ABNORMAL HIGH (ref 0.6–1.1)
GLUCOSE: 222 mg/dL — AB (ref 70–140)
POTASSIUM: 4.2 meq/L (ref 3.5–5.1)
SODIUM: 136 meq/L (ref 136–145)
TOTAL PROTEIN: 7.5 g/dL (ref 6.4–8.3)
Total Bilirubin: 0.46 mg/dL (ref 0.20–1.20)

## 2013-09-15 LAB — LACTATE DEHYDROGENASE (CC13): LDH: 182 U/L (ref 125–245)

## 2013-09-15 MED ORDER — DIPHENHYDRAMINE HCL 25 MG PO CAPS
50.0000 mg | ORAL_CAPSULE | Freq: Once | ORAL | Status: AC
  Administered 2013-09-15: 50 mg via ORAL

## 2013-09-15 MED ORDER — SODIUM CHLORIDE 0.9 % IV SOLN
Freq: Once | INTRAVENOUS | Status: AC
  Administered 2013-09-15: 11:00:00 via INTRAVENOUS

## 2013-09-15 MED ORDER — ACETAMINOPHEN 325 MG PO TABS
ORAL_TABLET | ORAL | Status: AC
  Filled 2013-09-15: qty 2

## 2013-09-15 MED ORDER — ACETAMINOPHEN 325 MG PO TABS
650.0000 mg | ORAL_TABLET | Freq: Once | ORAL | Status: AC
  Administered 2013-09-15: 650 mg via ORAL

## 2013-09-15 MED ORDER — DIPHENHYDRAMINE HCL 25 MG PO CAPS
ORAL_CAPSULE | ORAL | Status: AC
  Filled 2013-09-15: qty 2

## 2013-09-15 MED ORDER — RITUXIMAB CHEMO INJECTION 10 MG/ML
375.0000 mg/m2 | Freq: Once | INTRAVENOUS | Status: AC
  Administered 2013-09-15: 600 mg via INTRAVENOUS
  Filled 2013-09-15: qty 60

## 2013-09-15 NOTE — Progress Notes (Signed)
Per Dr.Gorsuch ok to treat without CMET

## 2013-09-15 NOTE — Patient Instructions (Signed)
[facility] Discharge Instructions for Patients Receiving Chemotherapy  Today you received the following chemotherapy agents Rituxan.  To help prevent nausea and vomiting after your treatment, we encourage you to take your nausea medication as prescribed.   If you develop nausea and vomiting that is not controlled by your nausea medication, call the clinic.   BELOW ARE SYMPTOMS THAT SHOULD BE REPORTED IMMEDIATELY:  *FEVER GREATER THAN 100.5 F  *CHILLS WITH OR WITHOUT FEVER  NAUSEA AND VOMITING THAT IS NOT CONTROLLED WITH YOUR NAUSEA MEDICATION  *UNUSUAL SHORTNESS OF BREATH  *UNUSUAL BRUISING OR BLEEDING  TENDERNESS IN MOUTH AND THROAT WITH OR WITHOUT PRESENCE OF ULCERS  *URINARY PROBLEMS  *BOWEL PROBLEMS  UNUSUAL RASH Items with * indicate a potential emergency and should be followed up as soon as possible.  Feel free to call the clinic you have any questions or concerns. The clinic phone number is [phone].    

## 2013-09-16 ENCOUNTER — Ambulatory Visit (INDEPENDENT_AMBULATORY_CARE_PROVIDER_SITE_OTHER): Payer: Medicare Other | Admitting: Surgery

## 2013-09-16 ENCOUNTER — Encounter (INDEPENDENT_AMBULATORY_CARE_PROVIDER_SITE_OTHER): Payer: Self-pay | Admitting: Surgery

## 2013-09-16 VITALS — BP 134/76 | HR 74 | Temp 98.0°F | Resp 18 | Ht 64.0 in | Wt 139.0 lb

## 2013-09-16 DIAGNOSIS — R599 Enlarged lymph nodes, unspecified: Secondary | ICD-10-CM

## 2013-09-16 DIAGNOSIS — R590 Localized enlarged lymph nodes: Secondary | ICD-10-CM

## 2013-09-16 NOTE — Progress Notes (Signed)
Re:   Jamie Mendez DOB:   01/07/1934 MRN:   [id number]  ASSESSMENT AND PLAN: 1.  Retroperitoneal lymphadenopathy  Left axillary lymph node biopsy - 08/23/2013 - D. 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 Alvy Bimler - placed on Retuximab  Has done well from biopsy - her return to me is PRN.  2.  Splenomegaly 3.  Weight loss 4.  Anemia  Hgb - 8.9 - 05/18/2013 5.  DM   Last glucose - 263 - 07/29/2013  HgbA1C - 8.6 - 05/18/2013 6.  HTN 7.  Hyperlipidemia  Chief Complaint  Patient presents with  . Routine Post Op    llymphadenopath   REFERRING PHYSICIAN:  Dr. Natale Lay  HISTORY OF PRESENT ILLNESS: Jamie Mendez is a 80 y.o. (DOB: [birth year])  white  female whose primary care physician is [name],[name], MD and comes to me today for weight loss and adenopathy. She comes by herself. She is doing well.  She is tolerating the treatment well so far.  No areas of concern. She wants to go back to water aerobics - which she has done for 12 years.  History of lymphoma symptoms: She has been loosing weight since last summer.  She guesses that she has lost 40 pounds.  Her appetite has generally been okay.  She has noticed no change in her bowel habits.  She underwent a upper endo, a colonoscopy, and a capsule endoscopy by Dr. Deatra Ina around November 2014.  She apparently did have a "superficial linear gastric ulcer" and diverticulosis.  She did have one hemoccult that was positive, but the rest were negative.  She saw Dr. Alvy Bimler in June 2015, who thought that her anemia was due to chronic blood loss/malabsorption.  But she then obtained a CT scan on 07/29/2013 which showed Splenomegaly (volume 1220 cubic cm). No focal splenic lesions. Small calcified granulomas are noted. Upper abdominal lymphadenopathy with a 4.8 x 4.0 cm nodal mass in the periportal region.  The patient has recently had some night sweats, but these have only  occurred over the last 2 weeks.  She did get an iron infusion 2 weeks ago and she thinks that she feels a little better.  No history of liver disease.  No history of gall bladder disease.  No history of pancreas disease.   Past Medical History  Diagnosis Date  . Diabetes   . High blood pressure   . Hyperlipidemia   . Neuropathy   . Anemia   . Weight loss 07/23/2013  . Splenomegaly 07/23/2013  . Wears glasses   . Marginal zone lymphoma 09/02/2013      Current Outpatient Prescriptions  Medication Sig Dispense Refill  . Calcium Carbonate-Vit D-Min (CALCIUM 1200 PO) Take 1 tablet by mouth daily.      . cholecalciferol (VITAMIN D) 1000 UNITS tablet Take 1,000 Units by mouth every Monday, Wednesday, and Friday. Three times per week      . Cinnamon 500 MG capsule Take 500 mg by mouth daily.      . enalapril (VASOTEC) 20 MG tablet Take 20 mg by mouth every morning.      . fish oil-omega-3 fatty acids 1000 MG capsule Take 1 g by mouth daily.       Marland Kitchen glimepiride (AMARYL) 4 MG tablet Take 4 mg by mouth daily with breakfast.      . glucose blood test strip Use as instructed  100  each  12  . HYDROcodone-acetaminophen (NORCO/VICODIN) 5-325 MG per tablet Take 1 tablet by mouth every 6 (six) hours as needed for moderate pain.      . Magnesium 500 MG CAPS Take 500 mg by mouth daily.       . metFORMIN (GLUCOPHAGE) 1000 MG tablet Take 1,000 mg by mouth daily with breakfast.      . Multiple Vitamins-Minerals (CENTRUM CARDIO) TABS Take 1 tablet by mouth daily.      . predniSONE (DELTASONE) 20 MG tablet Take 3 tablets (60 mg total) by mouth daily with breakfast.  9 tablet  0  . Red Yeast Rice 600 MG TABS Take 600 mg by mouth daily.        No current facility-administered medications for this visit.      Allergies  Allergen Reactions  . Aspirin Other (See Comments)    Nose bleeds  . Capoten [Captopril] Other (See Comments)    Mouth numbness   . Levemir [Insulin Detemir] Other (See Comments)     Decreased blood sugar  . Micronase [Glyburide]   . Onglyza [Saxagliptin] Other (See Comments)    Elevates blood sugar  . Statins     Numbness in face  . Zocor [Simvastatin] Other (See Comments)    Extreme fatigue    REVIEW OF SYSTEMS: Skin:  No history of rash.  No history of abnormal moles. Infection:  No history of hepatitis or HIV.  No history of MRSA. Neurologic:  No history of stroke.  No history of seizure.  No history of headaches. Cardiac:  HTN x 20 years.  Better with her weight loss.   No history of seeing a cardiologist. Pulmonary:  Does not smoke cigarettes.  No asthma or bronchitis.  No OSA/CPAP.  Endocrine:  DM x 15 years.  On oral hypoglycemics.   No thyroid disease. Gastrointestinal:  See HPI. Urologic:  Remote history of kidney stone - 1981-05-11. (maybe) Musculoskeletal:  No history of joint or back disease. Hematologic:  No bleeding disorder.  No history of anemia.  Not anticoagulated. Psycho-social:  The patient is oriented.   The patient has no obvious psychologic or social impairment to understanding our conversation and plan.  SOCIAL and FAMILY HISTORY: Widowed  (Her husband died in  May 11, 1996) 3 sons - Mickenzie Stolar (works Architect - on [employer]), Two live nearby and one lives in [place]. Accompanied with son, Chriselda Leppert, her son. She knows [name] (I took care of her husband, [name], who died in May 11, 2013)  PHYSICAL EXAM: BP 134/76  Pulse 74  Temp(Src) 98 F (36.7 C)  Resp 18  Ht 5\' 4"  (1.626 m)  Wt 139 lb (63.05 kg)  BMI 23.85 kg/m2  General: WN older WF who is alert and generally healthy appearing.  HEENT: Normal. Pupils equal. Lymph Nodes:  Left axillary wound looks good. Abdomen: Soft. No mass. No tenderness. No hernia. Normal bowel sounds.  Her spleen is about 4 cm below the left costal margin.  It may be a little smaller.  DATA REVIEWED: Epic notes  Alphonsa Overall, MD,  [facility], [place] [address]  [place], [place]    [place] Phone:  [phone] FAX:  [phone]

## 2013-09-22 ENCOUNTER — Other Ambulatory Visit (HOSPITAL_BASED_OUTPATIENT_CLINIC_OR_DEPARTMENT_OTHER): Payer: Medicare Other

## 2013-09-22 ENCOUNTER — Ambulatory Visit (HOSPITAL_BASED_OUTPATIENT_CLINIC_OR_DEPARTMENT_OTHER): Payer: Medicare Other

## 2013-09-22 VITALS — BP 126/65 | HR 79 | Temp 98.8°F | Resp 20

## 2013-09-22 DIAGNOSIS — C8580 Other specified types of non-Hodgkin lymphoma, unspecified site: Secondary | ICD-10-CM

## 2013-09-22 DIAGNOSIS — Z5112 Encounter for antineoplastic immunotherapy: Secondary | ICD-10-CM

## 2013-09-22 DIAGNOSIS — C8589 Other specified types of non-Hodgkin lymphoma, extranodal and solid organ sites: Secondary | ICD-10-CM

## 2013-09-22 LAB — COMPREHENSIVE METABOLIC PANEL (CC13)
ALT: 22 U/L (ref 0–55)
ANION GAP: 10 meq/L (ref 3–11)
AST: 21 U/L (ref 5–34)
Albumin: 3.2 g/dL — ABNORMAL LOW (ref 3.5–5.0)
Alkaline Phosphatase: 99 U/L (ref 40–150)
BUN: 30.8 mg/dL — ABNORMAL HIGH (ref 7.0–26.0)
CALCIUM: 9.5 mg/dL (ref 8.4–10.4)
CHLORIDE: 103 meq/L (ref 98–109)
CO2: 24 meq/L (ref 22–29)
CREATININE: 1.2 mg/dL — AB (ref 0.6–1.1)
Glucose: 320 mg/dl — ABNORMAL HIGH (ref 70–140)
Potassium: 4.5 mEq/L (ref 3.5–5.1)
Sodium: 136 mEq/L (ref 136–145)
Total Bilirubin: 0.41 mg/dL (ref 0.20–1.20)
Total Protein: 7 g/dL (ref 6.4–8.3)

## 2013-09-22 LAB — CBC WITH DIFFERENTIAL/PLATELET
BASO%: 1 % (ref 0.0–2.0)
Basophils Absolute: 0.1 10*3/uL (ref 0.0–0.1)
EOS%: 1.9 % (ref 0.0–7.0)
Eosinophils Absolute: 0.1 10*3/uL (ref 0.0–0.5)
HEMATOCRIT: 31.1 % — AB (ref 34.8–46.6)
HEMOGLOBIN: 9.9 g/dL — AB (ref 11.6–15.9)
LYMPH%: 19.1 % (ref 14.0–49.7)
MCH: 24.6 pg — AB (ref 25.1–34.0)
MCHC: 31.9 g/dL (ref 31.5–36.0)
MCV: 77.3 fL — ABNORMAL LOW (ref 79.5–101.0)
MONO#: 0.3 10*3/uL (ref 0.1–0.9)
MONO%: 6 % (ref 0.0–14.0)
NEUT#: 4.1 10*3/uL (ref 1.5–6.5)
NEUT%: 72 % (ref 38.4–76.8)
PLATELETS: 227 10*3/uL (ref 145–400)
RBC: 4.02 10*6/uL (ref 3.70–5.45)
RDW: 22.3 % — ABNORMAL HIGH (ref 11.2–14.5)
WBC: 5.6 10*3/uL (ref 3.9–10.3)
lymph#: 1.1 10*3/uL (ref 0.9–3.3)

## 2013-09-22 LAB — LACTATE DEHYDROGENASE (CC13): LDH: 151 U/L (ref 125–245)

## 2013-09-22 MED ORDER — DIPHENHYDRAMINE HCL 25 MG PO CAPS
50.0000 mg | ORAL_CAPSULE | Freq: Once | ORAL | Status: AC
  Administered 2013-09-22: 50 mg via ORAL

## 2013-09-22 MED ORDER — ACETAMINOPHEN 325 MG PO TABS
ORAL_TABLET | ORAL | Status: AC
  Filled 2013-09-22: qty 2

## 2013-09-22 MED ORDER — DIPHENHYDRAMINE HCL 25 MG PO CAPS
ORAL_CAPSULE | ORAL | Status: AC
  Filled 2013-09-22: qty 2

## 2013-09-22 MED ORDER — ACETAMINOPHEN 325 MG PO TABS
650.0000 mg | ORAL_TABLET | Freq: Once | ORAL | Status: AC
  Administered 2013-09-22: 650 mg via ORAL

## 2013-09-22 MED ORDER — SODIUM CHLORIDE 0.9 % IV SOLN
375.0000 mg/m2 | Freq: Once | INTRAVENOUS | Status: AC
  Administered 2013-09-22: 600 mg via INTRAVENOUS
  Filled 2013-09-22: qty 60

## 2013-09-22 MED ORDER — SODIUM CHLORIDE 0.9 % IV SOLN
Freq: Once | INTRAVENOUS | Status: AC
  Administered 2013-09-22: 10:00:00 via INTRAVENOUS

## 2013-09-22 NOTE — Patient Instructions (Signed)
[facility] Discharge Instructions for Patients Receiving Chemotherapy  Today you received the following chemotherapy agents: Rituxan To help prevent nausea and vomiting after your treatment, we encourage you to take your nausea medication as prescribed.   If you develop nausea and vomiting that is not controlled by your nausea medication, call the clinic.   BELOW ARE SYMPTOMS THAT SHOULD BE REPORTED IMMEDIATELY:  *FEVER GREATER THAN 100.5 F  *CHILLS WITH OR WITHOUT FEVER  NAUSEA AND VOMITING THAT IS NOT CONTROLLED WITH YOUR NAUSEA MEDICATION  *UNUSUAL SHORTNESS OF BREATH  *UNUSUAL BRUISING OR BLEEDING  TENDERNESS IN MOUTH AND THROAT WITH OR WITHOUT PRESENCE OF ULCERS  *URINARY PROBLEMS  *BOWEL PROBLEMS  UNUSUAL RASH Items with * indicate a potential emergency and should be followed up as soon as possible.  Feel free to call the clinic you have any questions or concerns. The clinic phone number is [phone].   Rituximab injection What is this medicine? RITUXIMAB (ri TUX i mab) is a monoclonal antibody. This medicine changes the way the body's immune system works. It is used commonly to treat non-Hodgkin's lymphoma and other conditions. In cancer cells, this drug targets a specific protein within cancer cells and stops the cancer cells from growing. It is also used to treat rhuematoid arthritis (RA). In RA, this medicine slow the inflammatory process and help reduce joint pain and swelling. This medicine is often used with other cancer or arthritis medications. This medicine may be used for other purposes; ask your health care provider or pharmacist if you have questions. COMMON BRAND NAME(S): Rituxan What should I tell my health care provider before I take this medicine? They need to know if you have any of these conditions: -blood disorders -heart disease -history of hepatitis B -infection (especially a virus infection such as chickenpox, cold sores, or  herpes) -irregular heartbeat -kidney disease -lung or breathing disease, like asthma -lupus -an unusual or allergic reaction to rituximab, mouse proteins, other medicines, foods, dyes, or preservatives -pregnant or trying to get pregnant -breast-feeding How should I use this medicine? This medicine is for infusion into a vein. It is administered in a hospital or clinic by a specially trained health care professional. A special MedGuide will be given to you by the pharmacist with each prescription and refill. Be sure to read this information carefully each time. Talk to your pediatrician regarding the use of this medicine in children. This medicine is not approved for use in children. Overdosage: If you think you have taken too much of this medicine contact a poison control center or emergency room at once. NOTE: This medicine is only for you. Do not share this medicine with others. What if I miss a dose? It is important not to miss a dose. Call your doctor or health care professional if you are unable to keep an appointment. What may interact with this medicine? -cisplatin -medicines for blood pressure -some other medicines for arthritis -vaccines This list may not describe all possible interactions. Give your health care provider a list of all the medicines, herbs, non-prescription drugs, or dietary supplements you use. Also tell them if you smoke, drink alcohol, or use illegal drugs. Some items may interact with your medicine. What should I watch for while using this medicine? Report any side effects that you notice during your treatment right away, such as changes in your breathing, fever, chills, dizziness or lightheadedness. These effects are more common with the first dose. Visit your prescriber or health  care professional for checks on your progress. You will need to have regular blood work. Report any other side effects. The side effects of this medicine can continue after you finish  your treatment. Continue your course of treatment even though you feel ill unless your doctor tells you to stop. Call your doctor or health care professional for advice if you get a fever, chills or sore throat, or other symptoms of a cold or flu. Do not treat yourself. This drug decreases your body's ability to fight infections. Try to avoid being around people who are sick. This medicine may increase your risk to bruise or bleed. Call your doctor or health care professional if you notice any unusual bleeding. Be careful brushing and flossing your teeth or using a toothpick because you may get an infection or bleed more easily. If you have any dental work done, tell your dentist you are receiving this medicine. Avoid taking products that contain aspirin, acetaminophen, ibuprofen, naproxen, or ketoprofen unless instructed by your doctor. These medicines may hide a fever. Do not become pregnant while taking this medicine. Women should inform their doctor if they wish to become pregnant or think they might be pregnant. There is a potential for serious side effects to an unborn child. Talk to your health care professional or pharmacist for more information. Do not breast-feed an infant while taking this medicine. What side effects may I notice from receiving this medicine? Side effects that you should report to your doctor or health care professional as soon as possible: -allergic reactions like skin rash, itching or hives, swelling of the face, lips, or tongue -low blood counts - this medicine may decrease the number of white blood cells, red blood cells and platelets. You may be at increased risk for infections and bleeding. -signs of infection - fever or chills, cough, sore throat, pain or difficulty passing urine -signs of decreased platelets or bleeding - bruising, pinpoint red spots on the skin, black, tarry stools, blood in the urine -signs of decreased red blood cells - unusually weak or tired,  fainting spells, lightheadedness -breathing problems -confused, not responsive -chest pain -fast, irregular heartbeat -feeling faint or lightheaded, falls -mouth sores -redness, blistering, peeling or loosening of the skin, including inside the mouth -stomach pain -swelling of the ankles, feet, or hands -trouble passing urine or change in the amount of urine Side effects that usually do not require medical attention (report to your doctor or other health care professional if they continue or are bothersome): -anxiety -headache -loss of appetite -muscle aches -nausea -night sweats This list may not describe all possible side effects. Call your doctor for medical advice about side effects. You may report side effects to FDA at 1-800-FDA-1088. Where should I keep my medicine? This drug is given in a hospital or clinic and will not be stored at home. NOTE: This sheet is a summary. It may not cover all possible information. If you have questions about this medicine, talk to your doctor, pharmacist, or health care provider.  2015, Elsevier/Gold Standard. (2007-09-14 14:04:59)

## 2013-09-24 ENCOUNTER — Encounter: Payer: Self-pay | Admitting: Hematology and Oncology

## 2013-09-29 ENCOUNTER — Ambulatory Visit (HOSPITAL_BASED_OUTPATIENT_CLINIC_OR_DEPARTMENT_OTHER): Payer: Medicare Other

## 2013-09-29 ENCOUNTER — Telehealth: Payer: Self-pay | Admitting: Hematology and Oncology

## 2013-09-29 ENCOUNTER — Encounter: Payer: Self-pay | Admitting: Hematology and Oncology

## 2013-09-29 ENCOUNTER — Ambulatory Visit (HOSPITAL_BASED_OUTPATIENT_CLINIC_OR_DEPARTMENT_OTHER): Payer: Medicare Other | Admitting: Hematology and Oncology

## 2013-09-29 ENCOUNTER — Other Ambulatory Visit (HOSPITAL_BASED_OUTPATIENT_CLINIC_OR_DEPARTMENT_OTHER): Payer: Medicare Other

## 2013-09-29 VITALS — BP 138/59 | HR 85 | Temp 97.8°F | Resp 18

## 2013-09-29 VITALS — BP 143/55 | HR 78 | Temp 98.5°F | Resp 20 | Ht 64.0 in | Wt 144.5 lb

## 2013-09-29 DIAGNOSIS — C8589 Other specified types of non-Hodgkin lymphoma, extranodal and solid organ sites: Secondary | ICD-10-CM

## 2013-09-29 DIAGNOSIS — D72819 Decreased white blood cell count, unspecified: Secondary | ICD-10-CM

## 2013-09-29 DIAGNOSIS — Z23 Encounter for immunization: Secondary | ICD-10-CM

## 2013-09-29 DIAGNOSIS — C8580 Other specified types of non-Hodgkin lymphoma, unspecified site: Secondary | ICD-10-CM

## 2013-09-29 DIAGNOSIS — R161 Splenomegaly, not elsewhere classified: Secondary | ICD-10-CM

## 2013-09-29 DIAGNOSIS — E1129 Type 2 diabetes mellitus with other diabetic kidney complication: Secondary | ICD-10-CM

## 2013-09-29 DIAGNOSIS — D630 Anemia in neoplastic disease: Secondary | ICD-10-CM

## 2013-09-29 DIAGNOSIS — R634 Abnormal weight loss: Secondary | ICD-10-CM

## 2013-09-29 DIAGNOSIS — Z5112 Encounter for antineoplastic immunotherapy: Secondary | ICD-10-CM

## 2013-09-29 LAB — COMPREHENSIVE METABOLIC PANEL (CC13)
ALBUMIN: 3.2 g/dL — AB (ref 3.5–5.0)
ALT: 22 U/L (ref 0–55)
AST: 19 U/L (ref 5–34)
Alkaline Phosphatase: 96 U/L (ref 40–150)
Anion Gap: 10 mEq/L (ref 3–11)
BUN: 31 mg/dL — ABNORMAL HIGH (ref 7.0–26.0)
CO2: 25 mEq/L (ref 22–29)
Calcium: 9.3 mg/dL (ref 8.4–10.4)
Chloride: 105 mEq/L (ref 98–109)
Creatinine: 1 mg/dL (ref 0.6–1.1)
Glucose: 222 mg/dl — ABNORMAL HIGH (ref 70–140)
POTASSIUM: 4.2 meq/L (ref 3.5–5.1)
SODIUM: 139 meq/L (ref 136–145)
Total Bilirubin: 0.34 mg/dL (ref 0.20–1.20)
Total Protein: 7 g/dL (ref 6.4–8.3)

## 2013-09-29 LAB — CBC WITH DIFFERENTIAL/PLATELET
BASO%: 1.4 % (ref 0.0–2.0)
Basophils Absolute: 0.1 10*3/uL (ref 0.0–0.1)
EOS ABS: 0.2 10*3/uL (ref 0.0–0.5)
EOS%: 3.4 % (ref 0.0–7.0)
HCT: 31.2 % — ABNORMAL LOW (ref 34.8–46.6)
HGB: 10 g/dL — ABNORMAL LOW (ref 11.6–15.9)
LYMPH#: 1.3 10*3/uL (ref 0.9–3.3)
LYMPH%: 27.5 % (ref 14.0–49.7)
MCH: 24.9 pg — ABNORMAL LOW (ref 25.1–34.0)
MCHC: 32.1 g/dL (ref 31.5–36.0)
MCV: 77.5 fL — ABNORMAL LOW (ref 79.5–101.0)
MONO#: 0.4 10*3/uL (ref 0.1–0.9)
MONO%: 8.9 % (ref 0.0–14.0)
NEUT%: 58.8 % (ref 38.4–76.8)
NEUTROS ABS: 2.7 10*3/uL (ref 1.5–6.5)
Platelets: 257 10*3/uL (ref 145–400)
RBC: 4.03 10*6/uL (ref 3.70–5.45)
RDW: 22.9 % — AB (ref 11.2–14.5)
WBC: 4.5 10*3/uL (ref 3.9–10.3)

## 2013-09-29 LAB — LACTATE DEHYDROGENASE (CC13): LDH: 137 U/L (ref 125–245)

## 2013-09-29 MED ORDER — SODIUM CHLORIDE 0.9 % IV SOLN
375.0000 mg/m2 | Freq: Once | INTRAVENOUS | Status: AC
  Administered 2013-09-29: 600 mg via INTRAVENOUS
  Filled 2013-09-29: qty 60

## 2013-09-29 MED ORDER — ACETAMINOPHEN 325 MG PO TABS
ORAL_TABLET | ORAL | Status: AC
  Filled 2013-09-29: qty 2

## 2013-09-29 MED ORDER — DIPHENHYDRAMINE HCL 25 MG PO CAPS
ORAL_CAPSULE | ORAL | Status: AC
  Filled 2013-09-29: qty 1

## 2013-09-29 MED ORDER — DIPHENHYDRAMINE HCL 25 MG PO CAPS
50.0000 mg | ORAL_CAPSULE | Freq: Once | ORAL | Status: AC
  Administered 2013-09-29: 50 mg via ORAL

## 2013-09-29 MED ORDER — DIPHENHYDRAMINE HCL 25 MG PO CAPS
ORAL_CAPSULE | ORAL | Status: AC
  Filled 2013-09-29: qty 2

## 2013-09-29 MED ORDER — ACETAMINOPHEN 325 MG PO TABS
650.0000 mg | ORAL_TABLET | Freq: Once | ORAL | Status: AC
  Administered 2013-09-29: 650 mg via ORAL

## 2013-09-29 MED ORDER — SODIUM CHLORIDE 0.9 % IV SOLN
Freq: Once | INTRAVENOUS | Status: AC
  Administered 2013-09-29: 10:00:00 via INTRAVENOUS

## 2013-09-29 MED ORDER — INFLUENZA VAC SPLIT QUAD 0.5 ML IM SUSY
0.5000 mL | PREFILLED_SYRINGE | Freq: Once | INTRAMUSCULAR | Status: AC
  Administered 2013-09-29: 0.5 mL via INTRAMUSCULAR
  Filled 2013-09-29: qty 0.5

## 2013-09-29 NOTE — Assessment & Plan Note (Signed)
This has resolved. She had excellent response to treatment.

## 2013-09-29 NOTE — Patient Instructions (Signed)
[facility] Discharge Instructions for Patients Receiving Chemotherapy  Today you received the following chemotherapy agents: rituxan  To help prevent nausea and vomiting after your treatment, we encourage you to take your nausea medication as prescribed.    If you develop nausea and vomiting that is not controlled by your nausea medication, call the clinic.   BELOW ARE SYMPTOMS THAT SHOULD BE REPORTED IMMEDIATELY:  *FEVER GREATER THAN 100.5 F  *CHILLS WITH OR WITHOUT FEVER  NAUSEA AND VOMITING THAT IS NOT CONTROLLED WITH YOUR NAUSEA MEDICATION  *UNUSUAL SHORTNESS OF BREATH  *UNUSUAL BRUISING OR BLEEDING  TENDERNESS IN MOUTH AND THROAT WITH OR WITHOUT PRESENCE OF ULCERS  *URINARY PROBLEMS  *BOWEL PROBLEMS  UNUSUAL RASH Items with * indicate a potential emergency and should be followed up as soon as possible.  Feel free to call the clinic you have any questions or concerns. The clinic phone number is [phone].

## 2013-09-29 NOTE — Assessment & Plan Note (Signed)
This has resolved. I will proceed with preventive influenza vaccination today.

## 2013-09-29 NOTE — Telephone Encounter (Signed)
gv and prited appt sched and avs for pt for Dec

## 2013-09-29 NOTE — Assessment & Plan Note (Signed)
This is likely anemia of chronic disease. The patient denies recent history of bleeding such as epistaxis, hematuria or hematochezia. She is asymptomatic from the anemia. We will observe for now.  She does not require transfusion now.   

## 2013-09-29 NOTE — Assessment & Plan Note (Signed)
She tolerated treatment well. She will complete last treatment today. I plan to order a PET CT scan to follow in 3 months. We discussed about maintenance rituximab. I plan to start maintenance rituximab after we have results of PET scan.

## 2013-09-29 NOTE — Progress Notes (Signed)
[place] OFFICE PROGRESS NOTE  Patient Care Team: Aretta Nip, MD as PCP - General (Family Medicine) Fabio Pierce, MD as Consulting Physician (Ophthalmology) Inda Castle, MD as Consulting Physician (Gastroenterology) Cammie Sickle, MD as Consulting Physician (Orthopedic Surgery) Jannette Spanner, MD as Referring Physician (Dermatology) Heath Lark, MD as Consulting Physician (Hematology and Oncology) Shann Medal, MD as Consulting Physician (General Surgery)  SUMMARY OF ONCOLOGIC HISTORY: Oncology History   Marginal zone lymphoma, with lymph node and bone marrow involvement along with splenomegaly   Primary site: Lymphoid Neoplasms   Staging method: AJCC 6th Edition   Clinical: Stage IV signed by Heath Lark, MD on 09/02/2013  7:38 PM   Summary: Stage IV       Marginal zone lymphoma   07/29/2013 Imaging CT scan shows splenomegaly and abnormal lymphadenopathy.   08/13/2013 Imaging PET CT scan confirmed lymphadenopathy and splenomegaly, those areas are PET avid   08/23/2013 Surgery [id number] left axillary lymph node biopsy confirmed a low-grade lymphoma.   08/27/2013 Bone Marrow Biopsy Bone marrow biopsy confirmed lymphoma.[id number]   09/08/2013 - 09/29/2013 Chemotherapy She completed 4 cycles of rituximab with resolution of splenomegaly.    INTERVAL HISTORY: Please see below for problem oriented charting. She is seen prior to cycle 4 of treatment. She feels well with excellent energy.  REVIEW OF SYSTEMS:   Constitutional: Denies fevers, chills or abnormal weight loss Eyes: Denies blurriness of vision Ears, nose, mouth, throat, and face: Denies mucositis or sore throat Respiratory: Denies cough, dyspnea or wheezes Cardiovascular: Denies palpitation, chest discomfort or lower extremity swelling Gastrointestinal:  Denies nausea, heartburn or change in bowel habits Skin: Denies abnormal skin rashes Lymphatics: Denies new lymphadenopathy or easy  bruising Neurological:Denies numbness, tingling or new weaknesses Behavioral/Psych: Mood is stable, no new changes  All other systems were reviewed with the patient and are negative.  I have reviewed the past medical history, past surgical history, social history and family history with the patient and they are unchanged from previous note.  ALLERGIES:  is allergic to aspirin; capoten; levemir; micronase; onglyza; statins; and zocor.  MEDICATIONS:  Current Outpatient Prescriptions  Medication Sig Dispense Refill  . Calcium Carbonate-Vit D-Min (CALCIUM 1200 PO) Take 1 tablet by mouth daily.      . cholecalciferol (VITAMIN D) 1000 UNITS tablet Take 1,000 Units by mouth every Monday, Wednesday, and Friday. Three times per week      . Cinnamon 500 MG capsule Take 500 mg by mouth daily.      . enalapril (VASOTEC) 20 MG tablet Take 20 mg by mouth every morning.      . fish oil-omega-3 fatty acids 1000 MG capsule Take 1 g by mouth daily.       Marland Kitchen glimepiride (AMARYL) 4 MG tablet Take 4 mg by mouth daily with breakfast.      . glucose blood test strip Use as instructed  100 each  12  . Magnesium 500 MG CAPS Take 500 mg by mouth daily.       . metFORMIN (GLUCOPHAGE) 1000 MG tablet Take 1,000 mg by mouth daily with breakfast.      . Multiple Vitamins-Minerals (CENTRUM CARDIO) TABS Take 1 tablet by mouth daily.      . predniSONE (DELTASONE) 20 MG tablet Take 3 tablets (60 mg total) by mouth daily with breakfast.  9 tablet  0  . Red Yeast Rice 600 MG TABS Take 600 mg by mouth daily.       Marland Kitchen  HYDROcodone-acetaminophen (NORCO/VICODIN) 5-325 MG per tablet Take 1 tablet by mouth every 6 (six) hours as needed for moderate pain.       No current facility-administered medications for this visit.    PHYSICAL EXAMINATION: ECOG PERFORMANCE STATUS: 0 - Asymptomatic  Filed Vitals:   09/29/13 0843  BP: 143/55  Pulse: 78  Temp: 98.5 F (36.9 C)  Resp: 20   Filed Weights   09/29/13 0843  Weight: 144 lb  8 oz (65.545 kg)    GENERAL:alert, no distress and comfortable SKIN: skin color, texture, turgor are normal, no rashes or significant lesions EYES: normal, Conjunctiva are pink and non-injected, sclera clear OROPHARYNX:no exudate, no erythema and lips, buccal mucosa, and tongue normal  NECK: supple, thyroid normal size, non-tender, without nodularity LYMPH:  no palpable lymphadenopathy in the cervical, axillary or inguinal LUNGS: clear to auscultation and percussion with normal breathing effort HEART: regular rate & rhythm and no murmurs and no lower extremity edema ABDOMEN:abdomen soft, non-tender and normal bowel sounds. The spleen is no longer palpable Musculoskeletal:no cyanosis of digits and no clubbing  NEURO: alert & oriented x 3 with fluent speech, no focal motor/sensory deficits  LABORATORY DATA:  I have reviewed the data as listed    Component Value Date/Time   NA 139 09/29/2013 0822   NA 137 08/27/2013 0710   K 4.2 09/29/2013 0822   K 4.3 08/27/2013 0710   CL 100 08/27/2013 0710   CO2 25 09/29/2013 0822   CO2 22 08/27/2013 0710   GLUCOSE 222* 09/29/2013 0822   GLUCOSE 180* 08/27/2013 0710   BUN 31.0* 09/29/2013 0822   BUN 16 08/27/2013 0710   CREATININE 1.0 09/29/2013 0822   CREATININE 0.87 08/27/2013 0710   CREATININE 1.12* 05/18/2013 1256   CALCIUM 9.3 09/29/2013 0822   CALCIUM 9.4 08/27/2013 0710   PROT 7.0 09/29/2013 0822   PROT 7.3 08/27/2013 0710   ALBUMIN 3.2* 09/29/2013 0822   ALBUMIN 2.9* 08/27/2013 0710   AST 19 09/29/2013 0822   AST 18 08/27/2013 0710   ALT 22 09/29/2013 0822   ALT 16 08/27/2013 0710   ALKPHOS 96 09/29/2013 0822   ALKPHOS 134* 08/27/2013 0710   BILITOT 0.34 09/29/2013 0822   BILITOT 0.4 08/27/2013 0710   GFRNONAA 62* 08/27/2013 0710   GFRNONAA 47* 05/18/2013 1256   GFRAA 72* 08/27/2013 0710   GFRAA 55* 05/18/2013 1256    No results found for this basename: SPEP, UPEP,  kappa and lambda light chains    Lab Results  Component Value Date   WBC 4.5 09/29/2013    NEUTROABS 2.7 09/29/2013   HGB 10.0* 09/29/2013   HCT 31.2* 09/29/2013   MCV 77.5* 09/29/2013   PLT 257 09/29/2013      Chemistry      Component Value Date/Time   NA 139 09/29/2013 0822   NA 137 08/27/2013 0710   K 4.2 09/29/2013 0822   K 4.3 08/27/2013 0710   CL 100 08/27/2013 0710   CO2 25 09/29/2013 0822   CO2 22 08/27/2013 0710   BUN 31.0* 09/29/2013 0822   BUN 16 08/27/2013 0710   CREATININE 1.0 09/29/2013 0822   CREATININE 0.87 08/27/2013 0710   CREATININE 1.12* 05/18/2013 1256      Component Value Date/Time   CALCIUM 9.3 09/29/2013 0822   CALCIUM 9.4 08/27/2013 0710   ALKPHOS 96 09/29/2013 0822   ALKPHOS 134* 08/27/2013 0710   AST 19 09/29/2013 0822   AST 18 08/27/2013 0710   ALT 22  09/29/2013 0822   ALT 16 08/27/2013 0710   BILITOT 0.34 09/29/2013 0822   BILITOT 0.4 08/27/2013 0710      ASSESSMENT & PLAN:  Marginal zone lymphoma She tolerated treatment well. She will complete last treatment today. I plan to order a PET CT scan to follow in 3 months. We discussed about maintenance rituximab. I plan to start maintenance rituximab after we have results of PET scan.  Splenomegaly This has resolved. She had excellent response to treatment.  T2 NIDDM w/Stage 3 CKD (GFR 40 ml/min) I recommend increase oral fluid intake.  Weight loss This is stable. She has increased albumin level. I recommend increase oral intake and we aim to gain at least 5 pounds over the next 3 months.  Leukopenia This has resolved. I will proceed with preventive influenza vaccination today.  Anemia in neoplastic disease This is likely anemia of chronic disease. The patient denies recent history of bleeding such as epistaxis, hematuria or hematochezia. She is asymptomatic from the anemia. We will observe for now.  She does not require transfusion now.       Orders Placed This Encounter  Procedures  . NM PET Image Restag (PS) Skull Base To Thigh    Standing Status: Future     Number of Occurrences:      Standing  Expiration Date: 11/29/2014    Order Specific Question:  Reason for Exam (SYMPTOM  OR DIAGNOSIS REQUIRED)    Answer:  restaging lymphoma assess response to Rx    Order Specific Question:  Preferred imaging location?    Answer:  [facility]   All questions were answered. The patient knows to call the clinic with any problems, questions or concerns. No barriers to learning was detected. I spent 30 minutes counseling the patient face to face. The total time spent in the appointment was 40 minutes and more than 50% was on counseling and review of test results     [facility], [place], MD 09/29/2013 9:40 AM

## 2013-09-29 NOTE — Assessment & Plan Note (Signed)
I recommend increase oral fluid intake.

## 2013-09-29 NOTE — Assessment & Plan Note (Signed)
This is stable. She has increased albumin level. I recommend increase oral intake and we aim to gain at least 5 pounds over the next 3 months.

## 2013-10-07 ENCOUNTER — Ambulatory Visit (INDEPENDENT_AMBULATORY_CARE_PROVIDER_SITE_OTHER): Payer: Medicare Other | Admitting: Surgery

## 2013-10-22 ENCOUNTER — Other Ambulatory Visit (HOSPITAL_COMMUNITY): Payer: Self-pay | Admitting: Family Medicine

## 2013-10-22 DIAGNOSIS — Z1231 Encounter for screening mammogram for malignant neoplasm of breast: Secondary | ICD-10-CM

## 2013-11-02 ENCOUNTER — Other Ambulatory Visit: Payer: Self-pay | Admitting: Physician Assistant

## 2013-11-04 ENCOUNTER — Ambulatory Visit (HOSPITAL_COMMUNITY)
Admission: RE | Admit: 2013-11-04 | Discharge: 2013-11-04 | Disposition: A | Discharge status: Home / Self Care | Payer: Medicare Other | Source: Ambulatory Visit | Attending: Family Medicine | Admitting: Family Medicine

## 2013-11-04 DIAGNOSIS — Z1231 Encounter for screening mammogram for malignant neoplasm of breast: Secondary | ICD-10-CM | POA: Diagnosis not present

## 2013-11-18 ENCOUNTER — Ambulatory Visit: Payer: Self-pay | Admitting: Emergency Medicine

## 2013-12-08 ENCOUNTER — Other Ambulatory Visit: Payer: Self-pay | Admitting: Hematology and Oncology

## 2013-12-29 ENCOUNTER — Ambulatory Visit (HOSPITAL_COMMUNITY)
Admission: RE | Admit: 2013-12-29 | Discharge: 2013-12-29 | Disposition: A | Discharge status: Home / Self Care | Payer: Medicare Other | Source: Ambulatory Visit | Attending: Hematology and Oncology | Admitting: Hematology and Oncology

## 2013-12-29 DIAGNOSIS — C884 Extranodal marginal zone B-cell lymphoma of mucosa-associated lymphoid tissue [MALT-lymphoma]: Secondary | ICD-10-CM | POA: Diagnosis present

## 2013-12-29 DIAGNOSIS — D7389 Other diseases of spleen: Secondary | ICD-10-CM | POA: Insufficient documentation

## 2013-12-29 DIAGNOSIS — I2510 Atherosclerotic heart disease of native coronary artery without angina pectoris: Secondary | ICD-10-CM | POA: Insufficient documentation

## 2013-12-29 DIAGNOSIS — Z23 Encounter for immunization: Secondary | ICD-10-CM

## 2013-12-29 DIAGNOSIS — C8580 Other specified types of non-Hodgkin lymphoma, unspecified site: Secondary | ICD-10-CM

## 2013-12-29 LAB — GLUCOSE, CAPILLARY: Glucose-Capillary: 228 mg/dL — ABNORMAL HIGH (ref 70–99)

## 2013-12-29 MED ORDER — FLUDEOXYGLUCOSE F - 18 (FDG) INJECTION
7.4400 | Freq: Once | INTRAVENOUS | Status: AC | PRN
  Administered 2013-12-29: 7.44 via INTRAVENOUS

## 2014-01-03 ENCOUNTER — Other Ambulatory Visit (HOSPITAL_BASED_OUTPATIENT_CLINIC_OR_DEPARTMENT_OTHER): Payer: Medicare Other

## 2014-01-03 ENCOUNTER — Ambulatory Visit (HOSPITAL_BASED_OUTPATIENT_CLINIC_OR_DEPARTMENT_OTHER): Payer: Medicare Other | Admitting: Hematology and Oncology

## 2014-01-03 ENCOUNTER — Encounter: Payer: Self-pay | Admitting: Hematology and Oncology

## 2014-01-03 ENCOUNTER — Telehealth: Payer: Self-pay | Admitting: Hematology and Oncology

## 2014-01-03 ENCOUNTER — Ambulatory Visit: Payer: Medicare Other

## 2014-01-03 ENCOUNTER — Telehealth: Payer: Self-pay | Admitting: *Deleted

## 2014-01-03 VITALS — BP 157/59 | HR 85 | Temp 98.9°F | Resp 18 | Wt 160.2 lb

## 2014-01-03 DIAGNOSIS — C8580 Other specified types of non-Hodgkin lymphoma, unspecified site: Secondary | ICD-10-CM

## 2014-01-03 DIAGNOSIS — E875 Hyperkalemia: Secondary | ICD-10-CM

## 2014-01-03 LAB — COMPREHENSIVE METABOLIC PANEL (CC13)
ALBUMIN: 3.6 g/dL (ref 3.5–5.0)
ALT: 30 U/L (ref 0–55)
AST: 19 U/L (ref 5–34)
Alkaline Phosphatase: 89 U/L (ref 40–150)
Anion Gap: 11 mEq/L (ref 3–11)
BUN: 23.8 mg/dL (ref 7.0–26.0)
CALCIUM: 9.7 mg/dL (ref 8.4–10.4)
CHLORIDE: 103 meq/L (ref 98–109)
CO2: 27 mEq/L (ref 22–29)
Creatinine: 1.1 mg/dL (ref 0.6–1.1)
EGFR: 49 mL/min/{1.73_m2} — ABNORMAL LOW (ref >90)
Glucose: 200 mg/dl — ABNORMAL HIGH (ref 70–140)
POTASSIUM: 5.2 meq/L — AB (ref 3.5–5.1)
Sodium: 141 mEq/L (ref 136–145)
Total Bilirubin: 0.54 mg/dL (ref 0.20–1.20)
Total Protein: 6.6 g/dL (ref 6.4–8.3)

## 2014-01-03 LAB — CBC WITH DIFFERENTIAL/PLATELET
BASO%: 0.8 % (ref 0.0–2.0)
BASOS ABS: 0 10*3/uL (ref 0.0–0.1)
EOS%: 1.9 % (ref 0.0–7.0)
Eosinophils Absolute: 0.1 10*3/uL (ref 0.0–0.5)
HCT: 37 % (ref 34.8–46.6)
HEMOGLOBIN: 12.3 g/dL (ref 11.6–15.9)
LYMPH%: 25.5 % (ref 14.0–49.7)
MCH: 29.7 pg (ref 25.1–34.0)
MCHC: 33.2 g/dL (ref 31.5–36.0)
MCV: 89.3 fL (ref 79.5–101.0)
MONO#: 0.5 10*3/uL (ref 0.1–0.9)
MONO%: 8 % (ref 0.0–14.0)
NEUT#: 3.6 10*3/uL (ref 1.5–6.5)
NEUT%: 63.8 % (ref 38.4–76.8)
PLATELETS: 227 10*3/uL (ref 145–400)
RBC: 4.14 10*6/uL (ref 3.70–5.45)
RDW: 14.1 % (ref 11.2–14.5)
WBC: 5.6 10*3/uL (ref 3.9–10.3)
lymph#: 1.4 10*3/uL (ref 0.9–3.3)

## 2014-01-03 LAB — LACTATE DEHYDROGENASE (CC13): LDH: 136 U/L (ref 125–245)

## 2014-01-03 NOTE — Telephone Encounter (Signed)
Informed pt of elevated Potassium level and to f/u w/ her PCP to monitor and may need adjustment on her Lisinopril.  Pt verbalized understanding.

## 2014-01-03 NOTE — Telephone Encounter (Signed)
gv adn printed aptp sched adn avs for pt for June 2016

## 2014-01-03 NOTE — Assessment & Plan Note (Signed)
She has complete response to treatment. We discussed the risks, benefit, side effects of maintenance therapy. I told her and the son that maintenance therapy appears to improve progression free survival but has no data right now to prove that it would improve overall survival. After much discussion, she is in agreement for observation only.

## 2014-01-03 NOTE — Assessment & Plan Note (Signed)
This is mild, likely related to lisinopril. We defer to PCP for medication adjustment.

## 2014-01-03 NOTE — Telephone Encounter (Signed)
Left Vm for pt to return nurse's call regarding her labwork.

## 2014-01-03 NOTE — Telephone Encounter (Signed)
-----   Message from Heath Lark, MD sent at 01/03/2014  9:15 AM EST ----- Regarding: mild elevated K Please let her know I have routed a copy of today's notes to PCP. She may need medication adjustment to lisinopril with high K

## 2014-01-03 NOTE — Progress Notes (Signed)
[place] OFFICE PROGRESS NOTE  Patient Care Team: Aretta Nip, MD as PCP - General (Family Medicine) Fabio Pierce, MD as Consulting Physician (Ophthalmology) Inda Castle, MD as Consulting Physician (Gastroenterology) Cammie Sickle, MD as Consulting Physician (Orthopedic Surgery) Jannette Spanner, MD as Referring Physician (Dermatology) Heath Lark, MD as Consulting Physician (Hematology and Oncology) Alphonsa Overall, MD as Consulting Physician (General Surgery)  SUMMARY OF ONCOLOGIC HISTORY: Oncology History   Marginal zone lymphoma, with lymph node and bone marrow involvement along with splenomegaly   Primary site: Lymphoid Neoplasms   Staging method: AJCC 6th Edition   Clinical: Stage IV signed by Heath Lark, MD on 09/02/2013  7:38 PM   Summary: Stage IV       Marginal zone lymphoma   07/29/2013 Imaging CT scan shows splenomegaly and abnormal lymphadenopathy.   08/13/2013 Imaging PET CT scan confirmed lymphadenopathy and splenomegaly, those areas are PET avid   08/23/2013 Surgery [id number] left axillary lymph node biopsy confirmed a low-grade lymphoma.   08/27/2013 Bone Marrow Biopsy Bone marrow biopsy confirmed lymphoma.[id number]   09/08/2013 - 09/29/2013 Chemotherapy She completed 4 cycles of rituximab with resolution of splenomegaly.   12/29/2013 Imaging PET scan show complete response.    INTERVAL HISTORY: Please see below for problem oriented charting. She returns to follow-up on test results. She denies new symptoms. Appetite is stable. Denies new lymphadenopathy.  REVIEW OF SYSTEMS:   Constitutional: Denies fevers, chills or abnormal weight loss Eyes: Denies blurriness of vision Ears, nose, mouth, throat, and face: Denies mucositis or sore throat Respiratory: Denies cough, dyspnea or wheezes Cardiovascular: Denies palpitation, chest discomfort or lower extremity swelling Gastrointestinal:  Denies nausea, heartburn or change in bowel  habits Skin: Denies abnormal skin rashes Lymphatics: Denies new lymphadenopathy or easy bruising Neurological:Denies numbness, tingling or new weaknesses Behavioral/Psych: Mood is stable, no new changes  All other systems were reviewed with the patient and are negative.  I have reviewed the past medical history, past surgical history, social history and family history with the patient and they are unchanged from previous note.  ALLERGIES:  is allergic to aspirin; capoten; levemir; micronase; onglyza; statins; and zocor.  MEDICATIONS:  Current Outpatient Prescriptions  Medication Sig Dispense Refill  . Calcium Carbonate-Vit D-Min (CALCIUM 1200 PO) Take 1 tablet by mouth daily.    . cholecalciferol (VITAMIN D) 1000 UNITS tablet Take 1,000 Units by mouth every Monday, Wednesday, and Friday. Three times per week    . Cinnamon 500 MG capsule Take 500 mg by mouth daily.    . enalapril (VASOTEC) 20 MG tablet Take 20 mg by mouth every morning.    . fish oil-omega-3 fatty acids 1000 MG capsule Take 1 g by mouth daily.     Marland Kitchen glimepiride (AMARYL) 4 MG tablet Take 4 mg by mouth daily with breakfast.    . glucose blood test strip Use as instructed 100 each 12  . Magnesium 500 MG CAPS Take 500 mg by mouth daily.     . metFORMIN (GLUCOPHAGE) 1000 MG tablet Take 1,000 mg by mouth daily with breakfast.    . Misc Natural Products (LUTEIN 20 PO) Take by mouth daily.    . Multiple Vitamins-Minerals (CENTRUM CARDIO) TABS Take 1 tablet by mouth daily.    . Multiple Vitamins-Minerals (ICAPS) CAPS Take by mouth daily.    . Red Yeast Rice 600 MG TABS Take 600 mg by mouth daily.      No current facility-administered medications for this  visit.    PHYSICAL EXAMINATION: ECOG PERFORMANCE STATUS: 0 - Asymptomatic  Filed Vitals:   01/03/14 0843  BP: 157/59  Pulse: 85  Temp: 98.9 F (37.2 C)  Resp: 18   Filed Weights   01/03/14 0843  Weight: 160 lb 3.2 oz (72.666 kg)    GENERAL:alert, no distress and  comfortable SKIN: skin color, texture, turgor are normal, no rashes or significant lesions EYES: normal, Conjunctiva are pink and non-injected, sclera clear Musculoskeletal:no cyanosis of digits and no clubbing  NEURO: alert & oriented x 3 with fluent speech, no focal motor/sensory deficits  LABORATORY DATA:  I have reviewed the data as listed    Component Value Date/Time   NA 141 01/03/2014 0812   NA 137 08/27/2013 0710   K 5.2* 01/03/2014 0812   K 4.3 08/27/2013 0710   CL 100 08/27/2013 0710   CO2 27 01/03/2014 0812   CO2 22 08/27/2013 0710   GLUCOSE 200* 01/03/2014 0812   GLUCOSE 180* 08/27/2013 0710   BUN 23.8 01/03/2014 0812   BUN 16 08/27/2013 0710   CREATININE 1.1 01/03/2014 0812   CREATININE 0.87 08/27/2013 0710   CREATININE 1.12* 05/18/2013 1256   CALCIUM 9.7 01/03/2014 0812   CALCIUM 9.4 08/27/2013 0710   PROT 6.6 01/03/2014 0812   PROT 7.3 08/27/2013 0710   ALBUMIN 3.6 01/03/2014 0812   ALBUMIN 2.9* 08/27/2013 0710   AST 19 01/03/2014 0812   AST 18 08/27/2013 0710   ALT 30 01/03/2014 0812   ALT 16 08/27/2013 0710   ALKPHOS 89 01/03/2014 0812   ALKPHOS 134* 08/27/2013 0710   BILITOT 0.54 01/03/2014 0812   BILITOT 0.4 08/27/2013 0710   GFRNONAA 62* 08/27/2013 0710   GFRNONAA 47* 05/18/2013 1256   GFRAA 72* 08/27/2013 0710   GFRAA 55* 05/18/2013 1256    No results found for: SPEP, UPEP  Lab Results  Component Value Date   WBC 5.6 01/03/2014   NEUTROABS 3.6 01/03/2014   HGB 12.3 01/03/2014   HCT 37.0 01/03/2014   MCV 89.3 01/03/2014   PLT 227 01/03/2014      Chemistry      Component Value Date/Time   NA 141 01/03/2014 0812   NA 137 08/27/2013 0710   K 5.2* 01/03/2014 0812   K 4.3 08/27/2013 0710   CL 100 08/27/2013 0710   CO2 27 01/03/2014 0812   CO2 22 08/27/2013 0710   BUN 23.8 01/03/2014 0812   BUN 16 08/27/2013 0710   CREATININE 1.1 01/03/2014 0812   CREATININE 0.87 08/27/2013 0710   CREATININE 1.12* 05/18/2013 1256      Component  Value Date/Time   CALCIUM 9.7 01/03/2014 0812   CALCIUM 9.4 08/27/2013 0710   ALKPHOS 89 01/03/2014 0812   ALKPHOS 134* 08/27/2013 0710   AST 19 01/03/2014 0812   AST 18 08/27/2013 0710   ALT 30 01/03/2014 0812   ALT 16 08/27/2013 0710   BILITOT 0.54 01/03/2014 0812   BILITOT 0.4 08/27/2013 0710       RADIOGRAPHIC STUDIES: I reviewed the PET/CT with the patient and her son. I have personally reviewed the radiological images as listed and agreed with the findings in the report.  ASSESSMENT & PLAN:  Marginal zone lymphoma She has complete response to treatment. We discussed the risks, benefit, side effects of maintenance therapy. I told her and the son that maintenance therapy appears to improve progression free survival but has no data right now to prove that it would improve overall survival. After  much discussion, she is in agreement for observation only.  Hyperkalemia This is mild, likely related to lisinopril. We defer to PCP for medication adjustment.   All questions were answered. The patient knows to call the clinic with any problems, questions or concerns. No barriers to learning was detected. I spent 25 minutes counseling the patient face to face. The total time spent in the appointment was 30 minutes and more than 50% was on counseling and review of test results     [facility], [place], MD 01/03/2014 9:14 AM

## 2014-03-31 ENCOUNTER — Encounter: Payer: Self-pay | Admitting: Emergency Medicine

## 2014-07-04 ENCOUNTER — Encounter: Payer: Self-pay | Admitting: Hematology and Oncology

## 2014-07-04 ENCOUNTER — Telehealth: Payer: Self-pay | Admitting: Hematology and Oncology

## 2014-07-04 ENCOUNTER — Ambulatory Visit (HOSPITAL_BASED_OUTPATIENT_CLINIC_OR_DEPARTMENT_OTHER): Payer: Medicare Other | Admitting: Hematology and Oncology

## 2014-07-04 ENCOUNTER — Other Ambulatory Visit (HOSPITAL_BASED_OUTPATIENT_CLINIC_OR_DEPARTMENT_OTHER): Payer: Medicare Other

## 2014-07-04 VITALS — BP 157/52 | HR 99 | Temp 98.6°F | Resp 18 | Ht 64.0 in | Wt 160.0 lb

## 2014-07-04 DIAGNOSIS — C8580 Other specified types of non-Hodgkin lymphoma, unspecified site: Secondary | ICD-10-CM

## 2014-07-04 DIAGNOSIS — D630 Anemia in neoplastic disease: Secondary | ICD-10-CM | POA: Diagnosis not present

## 2014-07-04 DIAGNOSIS — C8589 Other specified types of non-Hodgkin lymphoma, extranodal and solid organ sites: Secondary | ICD-10-CM

## 2014-07-04 DIAGNOSIS — I10 Essential (primary) hypertension: Secondary | ICD-10-CM | POA: Diagnosis not present

## 2014-07-04 LAB — CBC WITH DIFFERENTIAL/PLATELET
BASO%: 1.1 % (ref 0.0–2.0)
Basophils Absolute: 0 10*3/uL (ref 0.0–0.1)
EOS ABS: 0 10*3/uL (ref 0.0–0.5)
EOS%: 1 % (ref 0.0–7.0)
HCT: 29.8 % — ABNORMAL LOW (ref 34.8–46.6)
HEMOGLOBIN: 9.8 g/dL — AB (ref 11.6–15.9)
LYMPH%: 18.9 % (ref 14.0–49.7)
MCH: 26 pg (ref 25.1–34.0)
MCHC: 32.9 g/dL (ref 31.5–36.0)
MCV: 79.1 fL — AB (ref 79.5–101.0)
MONO#: 0.2 10*3/uL (ref 0.1–0.9)
MONO%: 5.5 % (ref 0.0–14.0)
NEUT%: 73.5 % (ref 38.4–76.8)
NEUTROS ABS: 2.9 10*3/uL (ref 1.5–6.5)
PLATELETS: 180 10*3/uL (ref 145–400)
RBC: 3.76 10*6/uL (ref 3.70–5.45)
RDW: 14.5 % (ref 11.2–14.5)
WBC: 4 10*3/uL (ref 3.9–10.3)
lymph#: 0.8 10*3/uL — ABNORMAL LOW (ref 0.9–3.3)

## 2014-07-04 LAB — COMPREHENSIVE METABOLIC PANEL (CC13)
ALT: 31 U/L (ref 0–55)
AST: 25 U/L (ref 5–34)
Albumin: 3.1 g/dL — ABNORMAL LOW (ref 3.5–5.0)
Alkaline Phosphatase: 193 U/L — ABNORMAL HIGH (ref 40–150)
Anion Gap: 10 mEq/L (ref 3–11)
BILIRUBIN TOTAL: 0.62 mg/dL (ref 0.20–1.20)
BUN: 21.2 mg/dL (ref 7.0–26.0)
CALCIUM: 9 mg/dL (ref 8.4–10.4)
CO2: 25 meq/L (ref 22–29)
Chloride: 104 mEq/L (ref 98–109)
Creatinine: 1.2 mg/dL — ABNORMAL HIGH (ref 0.6–1.1)
EGFR: 43 mL/min/{1.73_m2} — ABNORMAL LOW (ref >90)
GLUCOSE: 278 mg/dL — AB (ref 70–140)
Potassium: 4.5 mEq/L (ref 3.5–5.1)
Sodium: 139 mEq/L (ref 136–145)
Total Protein: 7.1 g/dL (ref 6.4–8.3)

## 2014-07-04 LAB — LACTATE DEHYDROGENASE (CC13): LDH: 214 U/L (ref 125–245)

## 2014-07-04 NOTE — Progress Notes (Signed)
[place] OFFICE PROGRESS NOTE  Patient Care Team: Aretta Nip, MD as PCP - General (Family Medicine) Sharyne Peach, MD as Consulting Physician (Ophthalmology) Inda Castle, MD as Consulting Physician (Gastroenterology) Theodis Sato, MD as Consulting Physician (Orthopedic Surgery) Jannette Spanner, MD as Referring Physician (Dermatology) Heath Lark, MD as Consulting Physician (Hematology and Oncology) Alphonsa Overall, MD as Consulting Physician (General Surgery)  SUMMARY OF ONCOLOGIC HISTORY: Oncology History   Marginal zone lymphoma, with lymph node and bone marrow involvement along with splenomegaly   Primary site: Lymphoid Neoplasms   Staging method: AJCC 6th Edition   Clinical: Stage IV signed by Heath Lark, MD on 09/02/2013  7:38 PM   Summary: Stage IV       Marginal zone lymphoma   07/29/2013 Imaging CT scan shows splenomegaly and abnormal lymphadenopathy.   08/13/2013 Imaging PET CT scan confirmed lymphadenopathy and splenomegaly, those areas are PET avid   08/23/2013 Surgery [id number] left axillary lymph node biopsy confirmed a low-grade lymphoma.   08/27/2013 Bone Marrow Biopsy Bone marrow biopsy confirmed lymphoma.[id number]   09/08/2013 - 09/29/2013 Chemotherapy She completed 4 cycles of rituximab with resolution of splenomegaly.   12/29/2013 Imaging PET scan show complete response.    INTERVAL HISTORY: Please see below for problem oriented charting. She returns for further follow-up. She denies any change in energy level. No new lymphadenopathy. Denies abdominal pain. Denies recent weight loss.  REVIEW OF SYSTEMS:   Constitutional: Denies fevers, chills or abnormal weight loss Eyes: Denies blurriness of vision Ears, nose, mouth, throat, and face: Denies mucositis or sore throat Respiratory: Denies cough, dyspnea or wheezes Cardiovascular: Denies palpitation, chest discomfort or lower extremity swelling Gastrointestinal:  Denies nausea, heartburn  or change in bowel habits Skin: Denies abnormal skin rashes Lymphatics: Denies new lymphadenopathy or easy bruising Neurological:Denies numbness, tingling or new weaknesses Behavioral/Psych: Mood is stable, no new changes  All other systems were reviewed with the patient and are negative.  I have reviewed the past medical history, past surgical history, social history and family history with the patient and they are unchanged from previous note.  ALLERGIES:  is allergic to aspirin; capoten; levemir; micronase; onglyza; statins; and zocor.  MEDICATIONS:  Current Outpatient Prescriptions  Medication Sig Dispense Refill  . Calcium Carbonate-Vit D-Min (CALCIUM 1200 PO) Take 1 tablet by mouth daily.    . cholecalciferol (VITAMIN D) 1000 UNITS tablet Take 1,000 Units by mouth every Monday, Wednesday, and Friday. Three times per week    . Cinnamon 500 MG capsule Take 500 mg by mouth daily.    . enalapril (VASOTEC) 20 MG tablet Take 20 mg by mouth every morning.    . fish oil-omega-3 fatty acids 1000 MG capsule Take 1 g by mouth daily.     Marland Kitchen glimepiride (AMARYL) 4 MG tablet Take 4 mg by mouth daily with breakfast.    . glucose blood test strip Use as instructed 100 each 12  . Magnesium 500 MG CAPS Take 500 mg by mouth daily.     . metFORMIN (GLUCOPHAGE) 1000 MG tablet Take 1,000 mg by mouth daily with breakfast.    . Misc Natural Products (LUTEIN 20 PO) Take by mouth daily.    . Multiple Vitamins-Minerals (CENTRUM CARDIO) TABS Take 1 tablet by mouth daily.    . Multiple Vitamins-Minerals (ICAPS) CAPS Take by mouth daily.    . Red Yeast Rice 600 MG TABS Take 600 mg by mouth daily.      No current facility-administered medications  for this visit.    PHYSICAL EXAMINATION: ECOG PERFORMANCE STATUS: 0 - Asymptomatic  Filed Vitals:   07/04/14 0813  BP: 157/52  Pulse: 99  Temp: 98.6 F (37 C)  Resp: 18   Filed Weights   07/04/14 0813  Weight: 160 lb (72.576 kg)    GENERAL:alert, no  distress and comfortable SKIN: skin color, texture, turgor are normal, no rashes or significant lesions EYES: normal, Conjunctiva are pink and non-injected, sclera clear OROPHARYNX:no exudate, no erythema and lips, buccal mucosa, and tongue normal  NECK: supple, thyroid normal size, non-tender, without nodularity LYMPH:  no palpable lymphadenopathy in the cervical, axillary or inguinal LUNGS: clear to auscultation and percussion with normal breathing effort HEART: regular rate & rhythm and no murmurs and no lower extremity edema ABDOMEN:abdomen soft, non-tender and normal bowel sounds. She has palpable splenomegaly Musculoskeletal:no cyanosis of digits and no clubbing  NEURO: alert & oriented x 3 with fluent speech, no focal motor/sensory deficits  LABORATORY DATA:  I have reviewed the data as listed    Component Value Date/Time   NA 139 07/04/2014 0801   NA 137 08/27/2013 0710   K 4.5 07/04/2014 0801   K 4.3 08/27/2013 0710   CL 100 08/27/2013 0710   CO2 25 07/04/2014 0801   CO2 22 08/27/2013 0710   GLUCOSE 278* 07/04/2014 0801   GLUCOSE 180* 08/27/2013 0710   BUN 21.2 07/04/2014 0801   BUN 16 08/27/2013 0710   CREATININE 1.2* 07/04/2014 0801   CREATININE 0.87 08/27/2013 0710   CREATININE 1.12* 05/18/2013 1256   CALCIUM 9.0 07/04/2014 0801   CALCIUM 9.4 08/27/2013 0710   PROT 7.1 07/04/2014 0801   PROT 7.3 08/27/2013 0710   ALBUMIN 3.1* 07/04/2014 0801   ALBUMIN 2.9* 08/27/2013 0710   AST 25 07/04/2014 0801   AST 18 08/27/2013 0710   ALT 31 07/04/2014 0801   ALT 16 08/27/2013 0710   ALKPHOS 193* 07/04/2014 0801   ALKPHOS 134* 08/27/2013 0710   BILITOT 0.62 07/04/2014 0801   BILITOT 0.4 08/27/2013 0710   GFRNONAA 62* 08/27/2013 0710   GFRNONAA 47* 05/18/2013 1256   GFRAA 72* 08/27/2013 0710   GFRAA 55* 05/18/2013 1256    No results found for: SPEP, UPEP  Lab Results  Component Value Date   WBC 4.0 07/04/2014   NEUTROABS 2.9 07/04/2014   HGB 9.8* 07/04/2014    HCT 29.8* 07/04/2014   MCV 79.1* 07/04/2014   PLT 180 07/04/2014      Chemistry      Component Value Date/Time   NA 139 07/04/2014 0801   NA 137 08/27/2013 0710   K 4.5 07/04/2014 0801   K 4.3 08/27/2013 0710   CL 100 08/27/2013 0710   CO2 25 07/04/2014 0801   CO2 22 08/27/2013 0710   BUN 21.2 07/04/2014 0801   BUN 16 08/27/2013 0710   CREATININE 1.2* 07/04/2014 0801   CREATININE 0.87 08/27/2013 0710   CREATININE 1.12* 05/18/2013 1256      Component Value Date/Time   CALCIUM 9.0 07/04/2014 0801   CALCIUM 9.4 08/27/2013 0710   ALKPHOS 193* 07/04/2014 0801   ALKPHOS 134* 08/27/2013 0710   AST 25 07/04/2014 0801   AST 18 08/27/2013 0710   ALT 31 07/04/2014 0801   ALT 16 08/27/2013 0710   BILITOT 0.62 07/04/2014 0801   BILITOT 0.4 08/27/2013 0710      ASSESSMENT & PLAN:  Marginal zone lymphoma I am concerned about palpable splenomegaly and recurrence of anemia. I recommend  restaging with PET CT scan and she agreed to proceed.   Anemia in neoplastic disease This is likely anemia of chronic disease. The patient denies recent history of bleeding such as epistaxis, hematuria or hematochezia. She is asymptomatic from the anemia. We will observe for now.  I recommend restaging PET CT scan as above.   Essential hypertension Her blood pressure is slightly elevated today could be related to anxiety. I will reassess again next week. She will continue her current blood pressure regimen.     Orders Placed This Encounter  Procedures  . NM PET Image Restag (PS) Skull Base To Thigh    Standing Status: Future     Number of Occurrences:      Standing Expiration Date: 09/03/2015    Order Specific Question:  Reason for Exam (SYMPTOM  OR DIAGNOSIS REQUIRED)    Answer:  palpable splenomedgaly, recurrent anemia, exclude lymphoma recurrence    Order Specific Question:  Preferred imaging location?    Answer:  [facility]   All questions were answered. The patient knows to  call the clinic with any problems, questions or concerns. No barriers to learning was detected. I spent 25 minutes counseling the patient face to face. The total time spent in the appointment was 30 minutes and more than 50% was on counseling and review of test results     [facility], [place], MD 07/04/2014 11:16 AM

## 2014-07-04 NOTE — Assessment & Plan Note (Signed)
I am concerned about palpable splenomegaly and recurrence of anemia. I recommend restaging with PET CT scan and she agreed to proceed.

## 2014-07-04 NOTE — Telephone Encounter (Signed)
Gave and printed appt sched and avs for pt for June °

## 2014-07-04 NOTE — Assessment & Plan Note (Signed)
Her blood pressure is slightly elevated today could be related to anxiety. I will reassess again next week. She will continue her current blood pressure regimen.

## 2014-07-04 NOTE — Assessment & Plan Note (Signed)
This is likely anemia of chronic disease. The patient denies recent history of bleeding such as epistaxis, hematuria or hematochezia. She is asymptomatic from the anemia. We will observe for now.  I recommend restaging PET CT scan as above.

## 2014-07-11 ENCOUNTER — Ambulatory Visit (HOSPITAL_COMMUNITY)
Admission: RE | Admit: 2014-07-11 | Discharge: 2014-07-11 | Disposition: A | Discharge status: Home / Self Care | Payer: Medicare Other | Source: Ambulatory Visit | Attending: Hematology and Oncology | Admitting: Hematology and Oncology

## 2014-07-11 DIAGNOSIS — C8580 Other specified types of non-Hodgkin lymphoma, unspecified site: Secondary | ICD-10-CM | POA: Diagnosis not present

## 2014-07-11 DIAGNOSIS — D649 Anemia, unspecified: Secondary | ICD-10-CM | POA: Insufficient documentation

## 2014-07-11 DIAGNOSIS — E785 Hyperlipidemia, unspecified: Secondary | ICD-10-CM | POA: Diagnosis not present

## 2014-07-11 DIAGNOSIS — I10 Essential (primary) hypertension: Secondary | ICD-10-CM | POA: Insufficient documentation

## 2014-07-11 DIAGNOSIS — R161 Splenomegaly, not elsewhere classified: Secondary | ICD-10-CM | POA: Insufficient documentation

## 2014-07-11 DIAGNOSIS — E119 Type 2 diabetes mellitus without complications: Secondary | ICD-10-CM | POA: Insufficient documentation

## 2014-07-11 LAB — GLUCOSE, CAPILLARY: Glucose-Capillary: 110 mg/dL — ABNORMAL HIGH (ref 65–99)

## 2014-07-11 MED ORDER — FLUDEOXYGLUCOSE F - 18 (FDG) INJECTION
8.4000 | Freq: Once | INTRAVENOUS | Status: AC | PRN
  Administered 2014-07-11: 7.35 via INTRAVENOUS

## 2014-07-12 ENCOUNTER — Ambulatory Visit (HOSPITAL_BASED_OUTPATIENT_CLINIC_OR_DEPARTMENT_OTHER): Payer: Medicare Other | Admitting: Hematology and Oncology

## 2014-07-12 ENCOUNTER — Ambulatory Visit (HOSPITAL_BASED_OUTPATIENT_CLINIC_OR_DEPARTMENT_OTHER): Payer: Medicare Other

## 2014-07-12 ENCOUNTER — Telehealth: Payer: Self-pay | Admitting: Hematology and Oncology

## 2014-07-12 VITALS — BP 176/52 | HR 96 | Temp 99.3°F | Resp 18 | Ht 64.0 in | Wt 161.9 lb

## 2014-07-12 DIAGNOSIS — D630 Anemia in neoplastic disease: Secondary | ICD-10-CM | POA: Diagnosis not present

## 2014-07-12 DIAGNOSIS — C8589 Other specified types of non-Hodgkin lymphoma, extranodal and solid organ sites: Secondary | ICD-10-CM

## 2014-07-12 DIAGNOSIS — C8580 Other specified types of non-Hodgkin lymphoma, unspecified site: Secondary | ICD-10-CM

## 2014-07-12 DIAGNOSIS — E1121 Type 2 diabetes mellitus with diabetic nephropathy: Secondary | ICD-10-CM

## 2014-07-12 DIAGNOSIS — I10 Essential (primary) hypertension: Secondary | ICD-10-CM | POA: Diagnosis not present

## 2014-07-12 LAB — CBC WITH DIFFERENTIAL/PLATELET
BASO%: 0.5 % (ref 0.0–2.0)
Basophils Absolute: 0 10*3/uL (ref 0.0–0.1)
EOS ABS: 0 10*3/uL (ref 0.0–0.5)
EOS%: 0.8 % (ref 0.0–7.0)
HCT: 28.6 % — ABNORMAL LOW (ref 34.8–46.6)
HGB: 9.3 g/dL — ABNORMAL LOW (ref 11.6–15.9)
LYMPH#: 0.7 10*3/uL — AB (ref 0.9–3.3)
LYMPH%: 17.5 % (ref 14.0–49.7)
MCH: 26.1 pg (ref 25.1–34.0)
MCHC: 32.5 g/dL (ref 31.5–36.0)
MCV: 80.3 fL (ref 79.5–101.0)
MONO#: 0.2 10*3/uL (ref 0.1–0.9)
MONO%: 6.5 % (ref 0.0–14.0)
NEUT%: 74.7 % (ref 38.4–76.8)
NEUTROS ABS: 2.8 10*3/uL (ref 1.5–6.5)
Platelets: 151 10*3/uL (ref 145–400)
RBC: 3.56 10*6/uL — AB (ref 3.70–5.45)
RDW: 14.1 % (ref 11.2–14.5)
WBC: 3.7 10*3/uL — AB (ref 3.9–10.3)

## 2014-07-12 LAB — COMPREHENSIVE METABOLIC PANEL (CC13)
ALBUMIN: 3 g/dL — AB (ref 3.5–5.0)
ALT: 24 U/L (ref 0–55)
AST: 19 U/L (ref 5–34)
Alkaline Phosphatase: 198 U/L — ABNORMAL HIGH (ref 40–150)
Anion Gap: 13 mEq/L — ABNORMAL HIGH (ref 3–11)
BUN: 19.4 mg/dL (ref 7.0–26.0)
CO2: 24 mEq/L (ref 22–29)
Calcium: 9 mg/dL (ref 8.4–10.4)
Chloride: 102 mEq/L (ref 98–109)
Creatinine: 1.2 mg/dL — ABNORMAL HIGH (ref 0.6–1.1)
EGFR: 45 mL/min/{1.73_m2} — AB (ref >90)
GLUCOSE: 343 mg/dL — AB (ref 70–140)
Potassium: 4.7 mEq/L (ref 3.5–5.1)
SODIUM: 140 meq/L (ref 136–145)
TOTAL PROTEIN: 6.9 g/dL (ref 6.4–8.3)
Total Bilirubin: 0.53 mg/dL (ref 0.20–1.20)

## 2014-07-12 LAB — HEPATITIS B SURFACE ANTIGEN: HEP B S AG: NEGATIVE

## 2014-07-12 LAB — URIC ACID (CC13): Uric Acid, Serum: 5.9 mg/dl (ref 2.6–7.4)

## 2014-07-12 LAB — HEPATITIS B SURFACE ANTIBODY,QUALITATIVE: HEP B S AB: NEGATIVE

## 2014-07-12 LAB — HEPATITIS B CORE ANTIBODY, IGM: Hep B C IgM: NONREACTIVE

## 2014-07-12 MED ORDER — ALLOPURINOL 300 MG PO TABS: 300.0000 mg | ORAL_TABLET | Freq: Every day | ORAL | Status: DC

## 2014-07-12 MED ORDER — PREDNISONE 20 MG PO TABS: 60.0000 mg | ORAL_TABLET | Freq: Every day | ORAL | Status: DC

## 2014-07-12 NOTE — Assessment & Plan Note (Signed)
Her blood pressure is high likely related to anxiety. She will continue blood pressure medications as prescribed.

## 2014-07-12 NOTE — Assessment & Plan Note (Addendum)
This is likely anemia of chronic disease. The patient denies recent history of bleeding such as epistaxis, hematuria or hematochezia. She is asymptomatic from the anemia. We will observe for now.  

## 2014-07-12 NOTE — Progress Notes (Signed)
[place] OFFICE PROGRESS NOTE  Patient Care Team: Aretta Nip, MD as PCP - General (Family Medicine) Sharyne Peach, MD as Consulting Physician (Ophthalmology) Inda Castle, MD as Consulting Physician (Gastroenterology) Theodis Sato, MD as Consulting Physician (Orthopedic Surgery) Jannette Spanner, MD as Referring Physician (Dermatology) Heath Lark, MD as Consulting Physician (Hematology and Oncology) Alphonsa Overall, MD as Consulting Physician (General Surgery)  SUMMARY OF ONCOLOGIC HISTORY: Oncology History   Marginal zone lymphoma, with lymph node and bone marrow involvement along with splenomegaly   Primary site: Lymphoid Neoplasms   Staging method: AJCC 6th Edition   Clinical: Stage IV signed by Heath Lark, MD on 09/02/2013  7:38 PM   Summary: Stage IV       Marginal zone lymphoma   07/29/2013 Imaging CT scan shows splenomegaly and abnormal lymphadenopathy.   08/13/2013 Imaging PET CT scan confirmed lymphadenopathy and splenomegaly, those areas are PET avid   08/23/2013 Surgery [id number] left axillary lymph node biopsy confirmed a low-grade lymphoma.   08/27/2013 Bone Marrow Biopsy Bone marrow biopsy confirmed lymphoma.[id number]   09/08/2013 - 09/29/2013 Chemotherapy She completed 4 cycles of rituximab with resolution of splenomegaly.   12/29/2013 Imaging PET scan show complete response.   07/11/2014 Imaging PET scan showed recurrent disease    INTERVAL HISTORY: Please see below for problem oriented charting. She returns for further follow-up. She complained of fatigue and intermittent night sweats.  REVIEW OF SYSTEMS:   Constitutional: Denies fevers, chills or abnormal weight loss Eyes: Denies blurriness of vision Ears, nose, mouth, throat, and face: Denies mucositis or sore throat Respiratory: Denies cough, dyspnea or wheezes Cardiovascular: Denies palpitation, chest discomfort or lower extremity swelling Gastrointestinal:  Denies nausea, heartburn or  change in bowel habits Skin: Denies abnormal skin rashes Lymphatics: Denies new lymphadenopathy or easy bruising Neurological:Denies numbness, tingling or new weaknesses Behavioral/Psych: Mood is stable, no new changes  All other systems were reviewed with the patient and are negative.  I have reviewed the past medical history, past surgical history, social history and family history with the patient and they are unchanged from previous note.  ALLERGIES:  is allergic to aspirin; capoten; levemir; micronase; onglyza; statins; and zocor.  MEDICATIONS:  Current Outpatient Prescriptions  Medication Sig Dispense Refill  . allopurinol (ZYLOPRIM) 300 MG tablet Take 1 tablet (300 mg total) by mouth daily. 30 tablet 0  . Calcium Carbonate-Vit D-Min (CALCIUM 1200 PO) Take 1 tablet by mouth daily.    . cholecalciferol (VITAMIN D) 1000 UNITS tablet Take 1,000 Units by mouth every Monday, Wednesday, and Friday. Three times per week    . Cinnamon 500 MG capsule Take 500 mg by mouth daily.    . enalapril (VASOTEC) 20 MG tablet Take 20 mg by mouth every morning.    . fish oil-omega-3 fatty acids 1000 MG capsule Take 1 g by mouth daily.     Marland Kitchen glimepiride (AMARYL) 4 MG tablet Take 4 mg by mouth daily with breakfast.    . glucose blood test strip Use as instructed 100 each 12  . Magnesium 500 MG CAPS Take 500 mg by mouth daily.     . metFORMIN (GLUCOPHAGE) 1000 MG tablet Take 1,000 mg by mouth daily with breakfast.    . Misc Natural Products (LUTEIN 20 PO) Take by mouth daily.    . Multiple Vitamins-Minerals (CENTRUM CARDIO) TABS Take 1 tablet by mouth daily.    . Multiple Vitamins-Minerals (ICAPS) CAPS Take by mouth daily.    . predniSONE (DELTASONE)  20 MG tablet Take 3 tablets (60 mg total) by mouth daily with breakfast. 21 tablet 0  . Red Yeast Rice 600 MG TABS Take 600 mg by mouth daily.      No current facility-administered medications for this visit.    PHYSICAL EXAMINATION: ECOG PERFORMANCE  STATUS: 1 - Symptomatic but completely ambulatory  Filed Vitals:   07/12/14 0914  BP: 176/52  Pulse: 96  Temp: 99.3 F (37.4 C)  Resp: 18   Filed Weights   07/12/14 0914  Weight: 161 lb 14.4 oz (73.437 kg)    GENERAL:alert, no distress and comfortable SKIN: skin color, texture, turgor are normal, no rashes or significant lesions EYES: normal, Conjunctiva are pink and non-injected, sclera clear OROPHARYNX:no exudate, no erythema and lips, buccal mucosa, and tongue normal  Musculoskeletal:no cyanosis of digits and no clubbing  NEURO: alert & oriented x 3 with fluent speech, no focal motor/sensory deficits  LABORATORY DATA:  I have reviewed the data as listed    Component Value Date/Time   NA 140 07/12/2014 1008   NA 137 08/27/2013 0710   K 4.7 07/12/2014 1008   K 4.3 08/27/2013 0710   CL 100 08/27/2013 0710   CO2 24 07/12/2014 1008   CO2 22 08/27/2013 0710   GLUCOSE 343* 07/12/2014 1008   GLUCOSE 180* 08/27/2013 0710   BUN 19.4 07/12/2014 1008   BUN 16 08/27/2013 0710   CREATININE 1.2* 07/12/2014 1008   CREATININE 0.87 08/27/2013 0710   CREATININE 1.12* 05/18/2013 1256   CALCIUM 9.0 07/12/2014 1008   CALCIUM 9.4 08/27/2013 0710   PROT 6.9 07/12/2014 1008   PROT 7.3 08/27/2013 0710   ALBUMIN 3.0* 07/12/2014 1008   ALBUMIN 2.9* 08/27/2013 0710   AST 19 07/12/2014 1008   AST 18 08/27/2013 0710   ALT 24 07/12/2014 1008   ALT 16 08/27/2013 0710   ALKPHOS 198* 07/12/2014 1008   ALKPHOS 134* 08/27/2013 0710   BILITOT 0.53 07/12/2014 1008   BILITOT 0.4 08/27/2013 0710   GFRNONAA 62* 08/27/2013 0710   GFRNONAA 47* 05/18/2013 1256   GFRAA 72* 08/27/2013 0710   GFRAA 55* 05/18/2013 1256    No results found for: SPEP, UPEP  Lab Results  Component Value Date   WBC 3.7* 07/12/2014   NEUTROABS 2.8 07/12/2014   HGB 9.3* 07/12/2014   HCT 28.6* 07/12/2014   MCV 80.3 07/12/2014   PLT 151 07/12/2014      Chemistry      Component Value Date/Time   NA 140 07/12/2014  1008   NA 137 08/27/2013 0710   K 4.7 07/12/2014 1008   K 4.3 08/27/2013 0710   CL 100 08/27/2013 0710   CO2 24 07/12/2014 1008   CO2 22 08/27/2013 0710   BUN 19.4 07/12/2014 1008   BUN 16 08/27/2013 0710   CREATININE 1.2* 07/12/2014 1008   CREATININE 0.87 08/27/2013 0710   CREATININE 1.12* 05/18/2013 1256      Component Value Date/Time   CALCIUM 9.0 07/12/2014 1008   CALCIUM 9.4 08/27/2013 0710   ALKPHOS 198* 07/12/2014 1008   ALKPHOS 134* 08/27/2013 0710   AST 19 07/12/2014 1008   AST 18 08/27/2013 0710   ALT 24 07/12/2014 1008   ALT 16 08/27/2013 0710   BILITOT 0.53 07/12/2014 1008   BILITOT 0.4 08/27/2013 0710       RADIOGRAPHIC STUDIES: I have personally reviewed the radiological images as listed and agreed with the findings in the report. Nm Pet Image Restag (ps) Skull Base  To Thigh  07/11/2014   CLINICAL DATA:  Subsequent treatment strategy for history of marginal zone lymphoma. Splenomegaly and recurrent anemia. Evaluate for recurrence. Diabetes. Hyperlipidemia. High blood pressure.  EXAM: NUCLEAR MEDICINE PET SKULL BASE TO THIGH  TECHNIQUE: 7.4 MCi F-18 FDG was injected intravenously. Full-ring PET imaging was performed from the skull base to thigh after the radiotracer. CT data was obtained and used for attenuation correction and anatomic localization.  FASTING BLOOD GLUCOSE:  Value: 110 mg/dl  COMPARISON:  12/29/2013  FINDINGS: NECK  No areas of abnormal hypermetabolism.  CHEST  Redevelopment of hypermetabolic left axillary nodes. An index node measures 9 mm and a S.U.V. max of 5.0 on image 52. Enlarged from 6 mm on the prior.  ABDOMEN/PELVIS  Development of splenomegaly and splenic hypermetabolism. The spleen measures approximately 21 cm craniocaudal and a S.U.V. max of 4.3. This compares to the liver, which measures a S.U.V. max of 2.7.  Aortocaval node measures 7 mm and a S.U.V. max of 2.9 on image 120. This is enlarged from 6 mm on the prior.  porta hepatis node which  measures 12 mm and a S.U.V. max of 3.2 on image 113.  Hypermetabolic left external iliac node which measures 1.0 cm and a S.U.V. max of 4.6. 6 mm on the prior.  SKELETON  No abnormal marrow activity.  CT IMAGES PERFORMED FOR ATTENUATION CORRECTION  Fluid in the left worse than right maxillary sinuses. No cervical adenopathy. Normal heart size with multivessel coronary artery atherosclerosis. Old granulomatous disease in the liver and spleen.  IMPRESSION: 1. Recurrent lymphoma, including within nodal stations of the chest, abdomen, and pelvis. Splenomegaly and splenic hypermetabolism, also highly suspicious for recurrent disease. 2. Sinus disease. 3.  Atherosclerosis, including within the coronary arteries.   Electronically Signed   By: Abigail Miyamoto M.D.   On: 07/11/2014 15:47     ASSESSMENT & PLAN:  Marginal zone lymphoma We discussed the role of chemotherapy. The intent is for palliative.  We discussed some of the risks, benefits, side-effects of Rituximab  Some of the short term side-effects included, though not limited to, risk of fatigue, pancytopenia, life-threatening infections, allergic reactions, nausea, vomiting, sores in the mouth, changes in bowel habits especially diarrhea, admission to hospital for various reasons, and risks of death.   The patient is aware that the response rates discussed earlier is not guaranteed.    After a long discussion, patient made an informed decision to proceed with the prescribed plan of care  Patient education material was dispensed. Due to high risk for tumor lysis syndrome, I will start her on allopurinol. To reduce the risk of allergic reaction, I will start her on prednisone 60 mg daily for 1 week.  Anemia in neoplastic disease This is likely anemia of chronic disease. The patient denies recent history of bleeding such as epistaxis, hematuria or hematochezia. She is asymptomatic from the anemia. We will observe for now.    Essential  hypertension Her blood pressure is high likely related to anxiety. She will continue blood pressure medications as prescribed.  Type 2 diabetes mellitus with diabetic nephropathy Her blood sugar is elevated and will likely remain elevated for week after treatment. Hopefully, after she completes a course of corticosteroids therapy, her blood sugar will improve. She is encouraged to drink plenty of liquids.  I reviewed the current guidelines with her and her son. Orders Placed This Encounter  Procedures  . Hepatitis B core antibody, IgM    Standing Status: Future  Number of Occurrences: 1     Standing Expiration Date: 08/16/2015  . Hepatitis B surface antibody    Standing Status: Future     Number of Occurrences: 1     Standing Expiration Date: 08/16/2015  . Hepatitis B surface antigen    Standing Status: Future     Number of Occurrences: 1     Standing Expiration Date: 08/16/2015  . Uric Acid    Standing Status: Future     Number of Occurrences: 1     Standing Expiration Date: 08/16/2015   All questions were answered. The patient knows to call the clinic with any problems, questions or concerns. No barriers to learning was detected. I spent 30 minutes counseling the patient face to face. The total time spent in the appointment was 40 minutes and more than 50% was on counseling and review of test results     [facility], [place], MD 07/12/2014 10:51 AM

## 2014-07-12 NOTE — Assessment & Plan Note (Signed)
Her blood sugar is elevated and will likely remain elevated for week after treatment. Hopefully, after she completes a course of corticosteroids therapy, her blood sugar will improve. She is encouraged to drink plenty of liquids.

## 2014-07-12 NOTE — Assessment & Plan Note (Signed)
We discussed the role of chemotherapy. The intent is for palliative.  We discussed some of the risks, benefits, side-effects of Rituximab  Some of the short term side-effects included, though not limited to, risk of fatigue, pancytopenia, life-threatening infections, allergic reactions, nausea, vomiting, sores in the mouth, changes in bowel habits especially diarrhea, admission to hospital for various reasons, and risks of death.   The patient is aware that the response rates discussed earlier is not guaranteed.    After a long discussion, patient made an informed decision to proceed with the prescribed plan of care  Patient education material was dispensed. Due to high risk for tumor lysis syndrome, I will start her on allopurinol. To reduce the risk of allergic reaction, I will start her on prednisone 60 mg daily for 1 week.

## 2014-07-12 NOTE — Telephone Encounter (Signed)
per pof to sch pt appt-sent MW email to sch trmt-pt aware-gave pt avs

## 2014-07-13 ENCOUNTER — Telehealth: Payer: Self-pay | Admitting: Hematology and Oncology

## 2014-07-13 NOTE — Telephone Encounter (Signed)
per pof to sch pt appt-cld pt and adv of appt times & dtaes-adv to get updated copy of sch on 6/21

## 2014-07-19 ENCOUNTER — Ambulatory Visit (HOSPITAL_BASED_OUTPATIENT_CLINIC_OR_DEPARTMENT_OTHER): Payer: Medicare Other | Admitting: Nurse Practitioner

## 2014-07-19 ENCOUNTER — Other Ambulatory Visit (HOSPITAL_BASED_OUTPATIENT_CLINIC_OR_DEPARTMENT_OTHER): Payer: Medicare Other

## 2014-07-19 ENCOUNTER — Ambulatory Visit (HOSPITAL_BASED_OUTPATIENT_CLINIC_OR_DEPARTMENT_OTHER): Payer: Medicare Other

## 2014-07-19 ENCOUNTER — Other Ambulatory Visit: Payer: Medicare Other

## 2014-07-19 VITALS — BP 156/62 | HR 109 | Temp 98.2°F | Resp 18

## 2014-07-19 DIAGNOSIS — T7840XA Allergy, unspecified, initial encounter: Secondary | ICD-10-CM | POA: Diagnosis not present

## 2014-07-19 DIAGNOSIS — C8589 Other specified types of non-Hodgkin lymphoma, extranodal and solid organ sites: Secondary | ICD-10-CM | POA: Diagnosis not present

## 2014-07-19 DIAGNOSIS — C8580 Other specified types of non-Hodgkin lymphoma, unspecified site: Secondary | ICD-10-CM | POA: Diagnosis not present

## 2014-07-19 DIAGNOSIS — Z5112 Encounter for antineoplastic immunotherapy: Secondary | ICD-10-CM | POA: Diagnosis not present

## 2014-07-19 LAB — CBC WITH DIFFERENTIAL/PLATELET
BASO%: 0.5 % (ref 0.0–2.0)
Basophils Absolute: 0 10*3/uL (ref 0.0–0.1)
EOS%: 0.3 % (ref 0.0–7.0)
Eosinophils Absolute: 0 10*3/uL (ref 0.0–0.5)
HEMATOCRIT: 29.9 % — AB (ref 34.8–46.6)
HEMOGLOBIN: 9.8 g/dL — AB (ref 11.6–15.9)
LYMPH%: 12.9 % — ABNORMAL LOW (ref 14.0–49.7)
MCH: 25.7 pg (ref 25.1–34.0)
MCHC: 32.8 g/dL (ref 31.5–36.0)
MCV: 78.1 fL — ABNORMAL LOW (ref 79.5–101.0)
MONO#: 0.4 10*3/uL (ref 0.1–0.9)
MONO%: 6.5 % (ref 0.0–14.0)
NEUT#: 4.4 10*3/uL (ref 1.5–6.5)
NEUT%: 79.8 % — ABNORMAL HIGH (ref 38.4–76.8)
Platelets: 264 10*3/uL (ref 145–400)
RBC: 3.83 10*6/uL (ref 3.70–5.45)
RDW: 15.1 % — AB (ref 11.2–14.5)
WBC: 5.5 10*3/uL (ref 3.9–10.3)
lymph#: 0.7 10*3/uL — ABNORMAL LOW (ref 0.9–3.3)

## 2014-07-19 LAB — COMPREHENSIVE METABOLIC PANEL (CC13)
ALK PHOS: 152 U/L — AB (ref 40–150)
ALT: 16 U/L (ref 0–55)
AST: 10 U/L (ref 5–34)
Albumin: 3.2 g/dL — ABNORMAL LOW (ref 3.5–5.0)
Anion Gap: 10 mEq/L (ref 3–11)
BILIRUBIN TOTAL: 0.69 mg/dL (ref 0.20–1.20)
BUN: 26.8 mg/dL — ABNORMAL HIGH (ref 7.0–26.0)
CO2: 28 mEq/L (ref 22–29)
Calcium: 10 mg/dL (ref 8.4–10.4)
Chloride: 99 mEq/L (ref 98–109)
Creatinine: 1.1 mg/dL (ref 0.6–1.1)
EGFR: 46 mL/min/{1.73_m2} — AB (ref >90)
GLUCOSE: 194 mg/dL — AB (ref 70–140)
Potassium: 3.8 mEq/L (ref 3.5–5.1)
SODIUM: 137 meq/L (ref 136–145)
TOTAL PROTEIN: 7.1 g/dL (ref 6.4–8.3)

## 2014-07-19 MED ORDER — ACETAMINOPHEN 325 MG PO TABS
650.0000 mg | ORAL_TABLET | Freq: Once | ORAL | Status: AC
Start: 2014-07-19 — End: 2014-07-19
  Administered 2014-07-19: 650 mg via ORAL

## 2014-07-19 MED ORDER — FAMOTIDINE IN NACL 20-0.9 MG/50ML-% IV SOLN: 20.0000 mg | Freq: Once | INTRAVENOUS | Status: DC | PRN

## 2014-07-19 MED ORDER — SODIUM CHLORIDE 0.9 % IV SOLN
Freq: Once | INTRAVENOUS | Status: AC
  Administered 2014-07-19: 11:00:00 via INTRAVENOUS

## 2014-07-19 MED ORDER — DIPHENHYDRAMINE HCL 25 MG PO CAPS
50.0000 mg | ORAL_CAPSULE | Freq: Once | ORAL | Status: AC
  Administered 2014-07-19: 50 mg via ORAL

## 2014-07-19 MED ORDER — METHYLPREDNISOLONE SODIUM SUCC 125 MG IJ SOLR
125.0000 mg | Freq: Once | INTRAMUSCULAR | Status: AC | PRN
  Administered 2014-07-19: 125 mg via INTRAVENOUS

## 2014-07-19 MED ORDER — DIPHENHYDRAMINE HCL 25 MG PO CAPS
ORAL_CAPSULE | ORAL | Status: AC
  Filled 2014-07-19: qty 2

## 2014-07-19 MED ORDER — RITUXIMAB CHEMO INJECTION 500 MG/50ML
375.0000 mg/m2 | Freq: Once | INTRAVENOUS | Status: AC
  Administered 2014-07-19: 700 mg via INTRAVENOUS
  Filled 2014-07-19: qty 70

## 2014-07-19 MED ORDER — ACETAMINOPHEN 325 MG PO TABS
ORAL_TABLET | ORAL | Status: AC
  Filled 2014-07-19: qty 2

## 2014-07-19 MED ORDER — SODIUM CHLORIDE 0.9 % IV SOLN
Freq: Once | INTRAVENOUS | Status: AC | PRN
  Administered 2014-07-19: 13:00:00 via INTRAVENOUS

## 2014-07-19 NOTE — Progress Notes (Signed)
1245 Patient complains of lower back pain rating the pain a 7 on the 0 to 10 pain scale. Patient states this is the same event that occurred the first time she received Rituxan. Rituxan stopped, normal saline free flowing. Selena Lesser, NP notified.  Russell, NP chairside to assess the paitent. Give solu-medrol 125 mg and pepcid 20 mg, monitor for 30 minutes for worsening symptoms.  1251 Solu-medrol administered via IV push  1252 Pepcid IVPB administered.  1320 Patient reports lower back pain a 1 on the 0 to 10 pain scale. Selena Lesser, NP notified. Proceed with Rituxan. Rituxan restarted at half the previous rate.   1615 Rituxan finished without further complications

## 2014-07-19 NOTE — Patient Instructions (Signed)
[facility] Discharge Instructions for Patients Receiving Chemotherapy  Today you received the following chemotherapy agents Rituxan To help prevent nausea and vomiting after your treatment, we encourage you to take your nausea medication as prescribed.If you develop nausea and vomiting that is not controlled by your nausea medication, call the clinic.   BELOW ARE SYMPTOMS THAT SHOULD BE REPORTED IMMEDIATELY:  *FEVER GREATER THAN 100.5 F  *CHILLS WITH OR WITHOUT FEVER  NAUSEA AND VOMITING THAT IS NOT CONTROLLED WITH YOUR NAUSEA MEDICATION  *UNUSUAL SHORTNESS OF BREATH  *UNUSUAL BRUISING OR BLEEDING  TENDERNESS IN MOUTH AND THROAT WITH OR WITHOUT PRESENCE OF ULCERS  *URINARY PROBLEMS  *BOWEL PROBLEMS  UNUSUAL RASH Items with * indicate a potential emergency and should be followed up as soon as possible.  Feel free to call the clinic you have any questions or concerns. The clinic phone number is [phone].  Please show the [facility] at check-in to the Emergency Department and triage nurse.   Rituximab injection (Rituxan) What is this medicine? RITUXIMAB (ri TUX i mab) is a monoclonal antibody. This medicine changes the way the body's immune system works. It is used commonly to treat non-Hodgkin's lymphoma and other conditions. In cancer cells, this drug targets a specific protein within cancer cells and stops the cancer cells from growing. It is also used to treat rhuematoid arthritis (RA). In RA, this medicine slow the inflammatory process and help reduce joint pain and swelling. This medicine is often used with other cancer or arthritis medications. This medicine may be used for other purposes; ask your health care provider or pharmacist if you have questions. COMMON BRAND NAME(S): Rituxan What should I tell my health care provider before I take this medicine? They need to know if you have any of these conditions: -blood disorders -heart  disease -history of hepatitis B -infection (especially a virus infection such as chickenpox, cold sores, or herpes) -irregular heartbeat -kidney disease -lung or breathing disease, like asthma -lupus -an unusual or allergic reaction to rituximab, mouse proteins, other medicines, foods, dyes, or preservatives -pregnant or trying to get pregnant -breast-feeding How should I use this medicine? This medicine is for infusion into a vein. It is administered in a hospital or clinic by a specially trained health care professional. A special MedGuide will be given to you by the pharmacist with each prescription and refill. Be sure to read this information carefully each time. Talk to your pediatrician regarding the use of this medicine in children. This medicine is not approved for use in children. Overdosage: If you think you have taken too much of this medicine contact a poison control center or emergency room at once. NOTE: This medicine is only for you. Do not share this medicine with others. What if I miss a dose? It is important not to miss a dose. Call your doctor or health care professional if you are unable to keep an appointment. What may interact with this medicine? -cisplatin -medicines for blood pressure -some other medicines for arthritis -vaccines This list may not describe all possible interactions. Give your health care provider a list of all the medicines, herbs, non-prescription drugs, or dietary supplements you use. Also tell them if you smoke, drink alcohol, or use illegal drugs. Some items may interact with your medicine. What should I watch for while using this medicine? Report any side effects that you notice during your treatment right away, such as changes in your breathing, fever, chills, dizziness or lightheadedness.  These effects are more common with the first dose. Visit your prescriber or health care professional for checks on your progress. You will need to have  regular blood work. Report any other side effects. The side effects of this medicine can continue after you finish your treatment. Continue your course of treatment even though you feel ill unless your doctor tells you to stop. Call your doctor or health care professional for advice if you get a fever, chills or sore throat, or other symptoms of a cold or flu. Do not treat yourself. This drug decreases your body's ability to fight infections. Try to avoid being around people who are sick. This medicine may increase your risk to bruise or bleed. Call your doctor or health care professional if you notice any unusual bleeding. Be careful brushing and flossing your teeth or using a toothpick because you may get an infection or bleed more easily. If you have any dental work done, tell your dentist you are receiving this medicine. Avoid taking products that contain aspirin, acetaminophen, ibuprofen, naproxen, or ketoprofen unless instructed by your doctor. These medicines may hide a fever. Do not become pregnant while taking this medicine. Women should inform their doctor if they wish to become pregnant or think they might be pregnant. There is a potential for serious side effects to an unborn child. Talk to your health care professional or pharmacist for more information. Do not breast-feed an infant while taking this medicine. What side effects may I notice from receiving this medicine? Side effects that you should report to your doctor or health care professional as soon as possible: -allergic reactions like skin rash, itching or hives, swelling of the face, lips, or tongue -low blood counts - this medicine may decrease the number of white blood cells, red blood cells and platelets. You may be at increased risk for infections and bleeding. -signs of infection - fever or chills, cough, sore throat, pain or difficulty passing urine -signs of decreased platelets or bleeding - bruising, pinpoint red spots on the  skin, black, tarry stools, blood in the urine -signs of decreased red blood cells - unusually weak or tired, fainting spells, lightheadedness -breathing problems -confused, not responsive -chest pain -fast, irregular heartbeat -feeling faint or lightheaded, falls -mouth sores -redness, blistering, peeling or loosening of the skin, including inside the mouth -stomach pain -swelling of the ankles, feet, or hands -trouble passing urine or change in the amount of urine Side effects that usually do not require medical attention (report to your doctor or other health care professional if they continue or are bothersome): -anxiety -headache -loss of appetite -muscle aches -nausea -night sweats This list may not describe all possible side effects. Call your doctor for medical advice about side effects. You may report side effects to FDA at 1-800-FDA-1088. Where should I keep my medicine? This drug is given in a hospital or clinic and will not be stored at home. NOTE: This sheet is a summary. It may not cover all possible information. If you have questions about this medicine, talk to your doctor, pharmacist, or health care provider.  2015, [employer]vier/Gold Standard. (2007-09-14 14:04:59)

## 2014-07-20 ENCOUNTER — Encounter: Payer: Self-pay | Admitting: Nurse Practitioner

## 2014-07-20 ENCOUNTER — Telehealth: Payer: Self-pay | Admitting: Hematology and Oncology

## 2014-07-20 DIAGNOSIS — T7840XA Allergy, unspecified, initial encounter: Secondary | ICD-10-CM | POA: Insufficient documentation

## 2014-07-20 NOTE — Telephone Encounter (Signed)
returned call and confirmed pt appt s.Marland KitchenMarland KitchenMarland Kitchen

## 2014-07-20 NOTE — Assessment & Plan Note (Signed)
Patient presented to the [facility] today to receive her first cycle of Rituxan chemotherapy.  Patient received Benadryl 50 mg prior to her chemotherapy today.  Patient was in the midst of her Rituxan infusion; and developed some fairly acute low back pain which she rated as 7 out of 10 on the pain scale.  She denied any other hypersensitivity symptoms whatsoever.  Rituxan infusion was held; and patient was given Pepcid 20 mg and Solu-Medrol 125 mg IV.  All symptoms completely resolved; and patient was able to complete her chemotherapy as previously directed.

## 2014-07-20 NOTE — Assessment & Plan Note (Signed)
Patient presented to the [facility] today.  Receive her first cycle of Rituxan chemotherapy.  She had received Rituxan approximately one year ago; and had developed a mild hypersensitivity reaction to the Rituxan at that time.  Patient was in the midst of the Rituxan infusion today; and developed some acute low back pain which she rated a 7 on a pain scale.  Rituxan infusion was held; and mild hypersensitivity reaction was managed per hypersensitivity protocol.  Patient was able to complete all of her Rituxan chemotherapy today as previously directed.  Patient has plans to return on 07/26/2014 for labs at her next cycle of chemotherapy.

## 2014-07-20 NOTE — Progress Notes (Signed)
SYMPTOM MANAGEMENT CLINIC   HPI: Jamie Mendez 81 y.o. female diagnosed with marginal zone lymphoma.  Here today to initiate Rituxan chemotherapy.  Patient presented to the [facility] today to receive her first cycle of Rituxan chemotherapy.  Patient received Benadryl 50 mg prior to her chemotherapy today.  Patient was in the midst of her Rituxan infusion; and developed some fairly acute low back pain which she rated as 7 out of 10 on the pain scale.  She denied any other hypersensitivity symptoms whatsoever.  Rituxan infusion was held; and patient was given Pepcid 20 mg and Solu-Medrol 125 mg IV.  All symptoms completely resolved; and patient was able to complete her chemotherapy as previously directed.  HPI  ROS  Past Medical History  Diagnosis Date  . Diabetes   . High blood pressure   . Hyperlipidemia   . Neuropathy   . Anemia   . Weight loss 07/23/2013  . Splenomegaly 07/23/2013  . Wears glasses   . Marginal zone lymphoma 09/02/2013    Past Surgical History  Procedure Laterality Date  . Colonoscopy    . Polypectomy  1989  . Esophagogastroduodenoscopy    . Breast surgery Right 1989    biopsy-benign  . Tonsillectomy    . Axillary lymph node biopsy Left 08/23/2013    Procedure: LEFT AXILLARY LYMPH NODE BIOPSY;  Surgeon: Shann Medal, MD;  Location: [facility];  Service: General;  Laterality: Left;    has Personal history of colonic polyps; Hypertension; Hyperlipidemia; Type 2 diabetes mellitus with diabetic nephropathy; Vitamin D Deficiency; Reflux esophagitis; Marginal zone lymphoma; Hyperkalemia; Anemia in neoplastic disease; Essential hypertension; and Hypersensitivity reaction on her problem list.    is allergic to aspirin; capoten; levemir; micronase; onglyza; statins; and zocor.    Medication List       This list is accurate as of: 07/19/14 11:59 PM.  Always use your most recent med list.               allopurinol 300 MG tablet    Commonly known as:  ZYLOPRIM  Take 1 tablet (300 mg total) by mouth daily.     CALCIUM 1200 PO  Take 1 tablet by mouth daily.     ICAPS Caps  Take by mouth daily.     CENTRUM CARDIO Tabs  Take 1 tablet by mouth daily.     cholecalciferol 1000 UNITS tablet  Commonly known as:  VITAMIN D  Take 1,000 Units by mouth every Monday, Wednesday, and Friday. Three times per week     Cinnamon 500 MG capsule  Take 500 mg by mouth daily.     enalapril 20 MG tablet  Commonly known as:  VASOTEC  Take 20 mg by mouth every morning.     fish oil-omega-3 fatty acids 1000 MG capsule  Take 1 g by mouth daily.     glimepiride 4 MG tablet  Commonly known as:  AMARYL  Take 4 mg by mouth daily with breakfast.     glucose blood test strip  Use as instructed     LUTEIN 20 PO  Take by mouth daily.     Magnesium 500 MG Caps  Take 500 mg by mouth daily.     metFORMIN 1000 MG tablet  Commonly known as:  GLUCOPHAGE  Take 1,000 mg by mouth daily with breakfast.     predniSONE 20 MG tablet  Commonly known as:  DELTASONE  Take 3 tablets (60 mg total) by  mouth daily with breakfast.     Red Yeast Rice 600 MG Tabs  Take 600 mg by mouth daily.         PHYSICAL EXAMINATION  Oncology Vitals 07/19/2014 07/19/2014 07/19/2014 07/19/2014 07/19/2014 07/19/2014 07/19/2014  Height - - - - - - -  Weight - - - - - - -  Weight (lbs) - - - - - - -  BMI (kg/m2) - - - - - - -  Temp 98.2 98.9 98.3 97.2 97.1 98.4 98.4  Pulse 109 103 106 104 105 92 84  Resp 18 18 18 18 18 18 18   SpO2 96 97 100 97 98 99 -  BSA (m2) - - - - - - -   BP Readings from Last 3 Encounters:  07/19/14 156/62  07/12/14 176/52  07/04/14 157/52    Physical Exam  Constitutional: She is oriented to person, place, and time and well-developed, well-nourished, and in no distress.  HENT:  Head: Normocephalic and atraumatic.  Eyes: Conjunctivae and EOM are normal. Pupils are equal, round, and reactive to light. Right eye exhibits no  discharge. Left eye exhibits no discharge. No scleral icterus.  Neck: Normal range of motion.  Pulmonary/Chest: Effort normal. No stridor. No respiratory distress.  Musculoskeletal: Normal range of motion. She exhibits tenderness. She exhibits no edema.  Mild to moderate low back pain.  Complaint; but no obvious tenderness to back with palpation.  Also, patient was observed with full range of motion and ambulating with no difficulty.  All back pain resolved with hypersensitivity protocol medications.  Neurological: She is alert and oriented to person, place, and time. Gait normal.  Skin: Skin is warm and dry. No rash noted. No erythema. No pallor.  Psychiatric: Affect normal.  Nursing note and vitals reviewed.   LABORATORY DATA:. Appointment on 07/19/2014  Component Date Value Ref Range Status  . WBC 07/19/2014 5.5  3.9 - 10.3 10e3/uL Final  . NEUT# 07/19/2014 4.4  1.5 - 6.5 10e3/uL Final  . HGB 07/19/2014 9.8* 11.6 - 15.9 g/dL Final  . HCT 07/19/2014 29.9* 34.8 - 46.6 % Final  . Platelets 07/19/2014 264  145 - 400 10e3/uL Final  . MCV 07/19/2014 78.1* 79.5 - 101.0 fL Final  . MCH 07/19/2014 25.7  25.1 - 34.0 pg Final  . MCHC 07/19/2014 32.8  31.5 - 36.0 g/dL Final  . RBC 07/19/2014 3.83  3.70 - 5.45 10e6/uL Final  . RDW 07/19/2014 15.1* 11.2 - 14.5 % Final  . lymph# 07/19/2014 0.7* 0.9 - 3.3 10e3/uL Final  . MONO# 07/19/2014 0.4  0.1 - 0.9 10e3/uL Final  . Eosinophils Absolute 07/19/2014 0.0  0.0 - 0.5 10e3/uL Final  . Basophils Absolute 07/19/2014 0.0  0.0 - 0.1 10e3/uL Final  . NEUT% 07/19/2014 79.8* 38.4 - 76.8 % Final  . LYMPH% 07/19/2014 12.9* 14.0 - 49.7 % Final  . MONO% 07/19/2014 6.5  0.0 - 14.0 % Final  . EOS% 07/19/2014 0.3  0.0 - 7.0 % Final  . BASO% 07/19/2014 0.5  0.0 - 2.0 % Final  . Sodium 07/19/2014 137  136 - 145 mEq/L Final  . Potassium 07/19/2014 3.8  3.5 - 5.1 mEq/L Final  . Chloride 07/19/2014 99  98 - 109 mEq/L Final  . CO2 07/19/2014 28  22 - 29 mEq/L Final   . Glucose 07/19/2014 194* 70 - 140 mg/dl Final  . BUN 07/19/2014 26.8* 7.0 - 26.0 mg/dL Final  . Creatinine 07/19/2014 1.1  0.6 - 1.1 mg/dL  Final  . Total Bilirubin 07/19/2014 0.69  0.20 - 1.20 mg/dL Final  . Alkaline Phosphatase 07/19/2014 152* 40 - 150 U/L Final  . AST 07/19/2014 10  5 - 34 U/L Final  . ALT 07/19/2014 16  0 - 55 U/L Final  . Total Protein 07/19/2014 7.1  6.4 - 8.3 g/dL Final  . Albumin 07/19/2014 3.2* 3.5 - 5.0 g/dL Final  . Calcium 07/19/2014 10.0  8.4 - 10.4 mg/dL Final  . Anion Gap 07/19/2014 10  3 - 11 mEq/L Final  . EGFR 07/19/2014 46* >90 ml/min/1.73 m2 Final   eGFR is calculated using the CKD-EPI Creatinine Equation (2009)     RADIOGRAPHIC STUDIES: No results found.  ASSESSMENT/PLAN:    Marginal zone lymphoma Patient presented to the [facility] today.  Receive her first cycle of Rituxan chemotherapy.  She had received Rituxan approximately one year ago; and had developed a mild hypersensitivity reaction to the Rituxan at that time.  Patient was in the midst of the Rituxan infusion today; and developed some acute low back pain which she rated a 7 on a pain scale.  Rituxan infusion was held; and mild hypersensitivity reaction was managed per hypersensitivity protocol.  Patient was able to complete all of her Rituxan chemotherapy today as previously directed.  Patient has plans to return on 07/26/2014 for labs at her next cycle of chemotherapy.  Hypersensitivity reaction Patient presented to the [facility] today to receive her first cycle of Rituxan chemotherapy.  Patient received Benadryl 50 mg prior to her chemotherapy today.  Patient was in the midst of her Rituxan infusion; and developed some fairly acute low back pain which she rated as 7 out of 10 on the pain scale.  She denied any other hypersensitivity symptoms whatsoever.  Rituxan infusion was held; and patient was given Pepcid 20 mg and Solu-Medrol 125 mg IV.  All symptoms completely resolved;  and patient was able to complete her chemotherapy as previously directed.    Patient stated understanding of all instructions; and was in agreement with this plan of care. The patient knows to call the clinic with any problems, questions or concerns.   Review/collaboration with Dr. Alvy Bimler regarding all aspects of patient's visit today.   Total time spent with patient was 25 minutes;  with greater than 75 percent of that time spent in face to face counseling regarding patient's symptoms,  and coordination of care and follow up.  Disclaimer: This note was dictated with voice recognition software. Similar sounding words can inadvertently be transcribed and may not be corrected upon review.   Drue Second, NP 07/20/2014

## 2014-07-21 ENCOUNTER — Telehealth: Payer: Self-pay

## 2014-07-21 NOTE — Telephone Encounter (Signed)
Called pt to follow up after [facility] visit 6/21. Several attempts made to both phone lines, with no answer. VM not set up.

## 2014-07-26 ENCOUNTER — Ambulatory Visit (HOSPITAL_BASED_OUTPATIENT_CLINIC_OR_DEPARTMENT_OTHER): Payer: Medicare Other

## 2014-07-26 ENCOUNTER — Other Ambulatory Visit (HOSPITAL_BASED_OUTPATIENT_CLINIC_OR_DEPARTMENT_OTHER): Payer: Medicare Other

## 2014-07-26 ENCOUNTER — Other Ambulatory Visit: Payer: Medicare Other

## 2014-07-26 VITALS — BP 144/52 | HR 83 | Temp 98.0°F | Resp 18

## 2014-07-26 DIAGNOSIS — Z5112 Encounter for antineoplastic immunotherapy: Secondary | ICD-10-CM

## 2014-07-26 DIAGNOSIS — C8580 Other specified types of non-Hodgkin lymphoma, unspecified site: Secondary | ICD-10-CM

## 2014-07-26 LAB — COMPREHENSIVE METABOLIC PANEL (CC13)
ALT: 14 U/L (ref 0–55)
AST: 14 U/L (ref 5–34)
Albumin: 3.2 g/dL — ABNORMAL LOW (ref 3.5–5.0)
Alkaline Phosphatase: 131 U/L (ref 40–150)
Anion Gap: 9 mEq/L (ref 3–11)
BUN: 21.2 mg/dL (ref 7.0–26.0)
CALCIUM: 9.5 mg/dL (ref 8.4–10.4)
CHLORIDE: 99 meq/L (ref 98–109)
CO2: 25 mEq/L (ref 22–29)
CREATININE: 1.2 mg/dL — AB (ref 0.6–1.1)
EGFR: 43 mL/min/{1.73_m2} — ABNORMAL LOW (ref >90)
Glucose: 350 mg/dl — ABNORMAL HIGH (ref 70–140)
POTASSIUM: 4.4 meq/L (ref 3.5–5.1)
SODIUM: 133 meq/L — AB (ref 136–145)
TOTAL PROTEIN: 7 g/dL (ref 6.4–8.3)
Total Bilirubin: 0.7 mg/dL (ref 0.20–1.20)

## 2014-07-26 LAB — CBC WITH DIFFERENTIAL/PLATELET
BASO%: 1.4 % (ref 0.0–2.0)
Basophils Absolute: 0.1 10*3/uL (ref 0.0–0.1)
EOS ABS: 0.1 10*3/uL (ref 0.0–0.5)
EOS%: 1.1 % (ref 0.0–7.0)
HCT: 32.5 % — ABNORMAL LOW (ref 34.8–46.6)
HEMOGLOBIN: 10.5 g/dL — AB (ref 11.6–15.9)
LYMPH%: 17.9 % (ref 14.0–49.7)
MCH: 25.3 pg (ref 25.1–34.0)
MCHC: 32.4 g/dL (ref 31.5–36.0)
MCV: 78.2 fL — ABNORMAL LOW (ref 79.5–101.0)
MONO#: 0.4 10*3/uL (ref 0.1–0.9)
MONO%: 7.2 % (ref 0.0–14.0)
NEUT%: 72.4 % (ref 38.4–76.8)
NEUTROS ABS: 3.9 10*3/uL (ref 1.5–6.5)
PLATELETS: 132 10*3/uL — AB (ref 145–400)
RBC: 4.16 10*6/uL (ref 3.70–5.45)
RDW: 15.6 % — ABNORMAL HIGH (ref 11.2–14.5)
WBC: 5.4 10*3/uL (ref 3.9–10.3)
lymph#: 1 10*3/uL (ref 0.9–3.3)

## 2014-07-26 MED ORDER — SODIUM CHLORIDE 0.9 % IV SOLN
Freq: Once | INTRAVENOUS | Status: AC
  Administered 2014-07-26: 11:00:00 via INTRAVENOUS

## 2014-07-26 MED ORDER — DIPHENHYDRAMINE HCL 25 MG PO CAPS
50.0000 mg | ORAL_CAPSULE | Freq: Once | ORAL | Status: AC
  Administered 2014-07-26: 50 mg via ORAL

## 2014-07-26 MED ORDER — DIPHENHYDRAMINE HCL 25 MG PO CAPS
ORAL_CAPSULE | ORAL | Status: AC
  Filled 2014-07-26: qty 2

## 2014-07-26 MED ORDER — ACETAMINOPHEN 325 MG PO TABS
650.0000 mg | ORAL_TABLET | Freq: Once | ORAL | Status: AC
  Administered 2014-07-26: 650 mg via ORAL

## 2014-07-26 MED ORDER — SODIUM CHLORIDE 0.9 % IV SOLN
375.0000 mg/m2 | Freq: Once | INTRAVENOUS | Status: AC
  Administered 2014-07-26: 700 mg via INTRAVENOUS
  Filled 2014-07-26: qty 70

## 2014-07-26 NOTE — Patient Instructions (Signed)
[facility] Discharge Instructions for Patients Receiving Chemotherapy  Today you received the following chemotherapy agents Rituxan  To help prevent nausea and vomiting after your treatment, we encourage you to take your nausea medication    If you develop nausea and vomiting that is not controlled by your nausea medication, call the clinic.   BELOW ARE SYMPTOMS THAT SHOULD BE REPORTED IMMEDIATELY:  *FEVER GREATER THAN 100.5 F  *CHILLS WITH OR WITHOUT FEVER  NAUSEA AND VOMITING THAT IS NOT CONTROLLED WITH YOUR NAUSEA MEDICATION  *UNUSUAL SHORTNESS OF BREATH  *UNUSUAL BRUISING OR BLEEDING  TENDERNESS IN MOUTH AND THROAT WITH OR WITHOUT PRESENCE OF ULCERS  *URINARY PROBLEMS  *BOWEL PROBLEMS  UNUSUAL RASH Items with * indicate a potential emergency and should be followed up as soon as possible.  Feel free to call the clinic you have any questions or concerns. The clinic phone number is [phone].  Please show the CHEMO ALERT CARD at check-in to the Emergency Department and triage nurse.   

## 2014-08-02 ENCOUNTER — Other Ambulatory Visit: Payer: Medicare Other

## 2014-08-02 ENCOUNTER — Other Ambulatory Visit: Payer: Self-pay | Admitting: *Deleted

## 2014-08-02 ENCOUNTER — Ambulatory Visit (HOSPITAL_BASED_OUTPATIENT_CLINIC_OR_DEPARTMENT_OTHER): Payer: Medicare Other

## 2014-08-02 ENCOUNTER — Other Ambulatory Visit (HOSPITAL_BASED_OUTPATIENT_CLINIC_OR_DEPARTMENT_OTHER): Payer: Medicare Other

## 2014-08-02 VITALS — BP 149/55 | HR 91 | Temp 98.0°F | Resp 18

## 2014-08-02 DIAGNOSIS — C8580 Other specified types of non-Hodgkin lymphoma, unspecified site: Secondary | ICD-10-CM

## 2014-08-02 DIAGNOSIS — Z5112 Encounter for antineoplastic immunotherapy: Secondary | ICD-10-CM

## 2014-08-02 DIAGNOSIS — C8589 Other specified types of non-Hodgkin lymphoma, extranodal and solid organ sites: Secondary | ICD-10-CM

## 2014-08-02 LAB — CBC WITH DIFFERENTIAL/PLATELET
BASO%: 1.2 % (ref 0.0–2.0)
BASOS ABS: 0.1 10*3/uL (ref 0.0–0.1)
EOS%: 1 % (ref 0.0–7.0)
Eosinophils Absolute: 0.1 10*3/uL (ref 0.0–0.5)
HCT: 30.7 % — ABNORMAL LOW (ref 34.8–46.6)
HEMOGLOBIN: 10.1 g/dL — AB (ref 11.6–15.9)
LYMPH#: 1.4 10*3/uL (ref 0.9–3.3)
LYMPH%: 24.6 % (ref 14.0–49.7)
MCH: 26.1 pg (ref 25.1–34.0)
MCHC: 32.9 g/dL (ref 31.5–36.0)
MCV: 79.3 fL — ABNORMAL LOW (ref 79.5–101.0)
MONO#: 0.3 10*3/uL (ref 0.1–0.9)
MONO%: 5.9 % (ref 0.0–14.0)
NEUT#: 3.9 10*3/uL (ref 1.5–6.5)
NEUT%: 67.3 % (ref 38.4–76.8)
Platelets: 184 10*3/uL (ref 145–400)
RBC: 3.87 10*6/uL (ref 3.70–5.45)
RDW: 15.8 % — AB (ref 11.2–14.5)
WBC: 5.7 10*3/uL (ref 3.9–10.3)
nRBC: 0 % (ref 0–0)

## 2014-08-02 LAB — TECHNOLOGIST REVIEW

## 2014-08-02 MED ORDER — DIPHENHYDRAMINE HCL 25 MG PO CAPS
50.0000 mg | ORAL_CAPSULE | Freq: Once | ORAL | Status: AC
  Administered 2014-08-02: 50 mg via ORAL

## 2014-08-02 MED ORDER — ACETAMINOPHEN 325 MG PO TABS
ORAL_TABLET | ORAL | Status: AC
  Filled 2014-08-02: qty 2

## 2014-08-02 MED ORDER — DIPHENHYDRAMINE HCL 25 MG PO CAPS
ORAL_CAPSULE | ORAL | Status: AC
  Filled 2014-08-02: qty 2

## 2014-08-02 MED ORDER — ACETAMINOPHEN 325 MG PO TABS
650.0000 mg | ORAL_TABLET | Freq: Once | ORAL | Status: AC
  Administered 2014-08-02: 650 mg via ORAL

## 2014-08-02 MED ORDER — SODIUM CHLORIDE 0.9 % IV SOLN
375.0000 mg/m2 | Freq: Once | INTRAVENOUS | Status: AC
  Administered 2014-08-02: 700 mg via INTRAVENOUS
  Filled 2014-08-02: qty 70

## 2014-08-02 MED ORDER — SODIUM CHLORIDE 0.9 % IV SOLN
Freq: Once | INTRAVENOUS | Status: AC
  Administered 2014-08-02: 13:00:00 via INTRAVENOUS

## 2014-08-02 NOTE — Patient Instructions (Signed)
[facility] Discharge Instructions for Patients Receiving Chemotherapy  Today you received the following chemotherapy agents Rituxan  To help prevent nausea and vomiting after your treatment, we encourage you to take your nausea medication    If you develop nausea and vomiting that is not controlled by your nausea medication, call the clinic.   BELOW ARE SYMPTOMS THAT SHOULD BE REPORTED IMMEDIATELY:  *FEVER GREATER THAN 100.5 F  *CHILLS WITH OR WITHOUT FEVER  NAUSEA AND VOMITING THAT IS NOT CONTROLLED WITH YOUR NAUSEA MEDICATION  *UNUSUAL SHORTNESS OF BREATH  *UNUSUAL BRUISING OR BLEEDING  TENDERNESS IN MOUTH AND THROAT WITH OR WITHOUT PRESENCE OF ULCERS  *URINARY PROBLEMS  *BOWEL PROBLEMS  UNUSUAL RASH Items with * indicate a potential emergency and should be followed up as soon as possible.  Feel free to call the clinic you have any questions or concerns. The clinic phone number is [phone].  Please show the CHEMO ALERT CARD at check-in to the Emergency Department and triage nurse.   

## 2014-08-08 ENCOUNTER — Other Ambulatory Visit: Payer: Self-pay | Admitting: Hematology and Oncology

## 2014-08-08 DIAGNOSIS — C8580 Other specified types of non-Hodgkin lymphoma, unspecified site: Secondary | ICD-10-CM

## 2014-08-09 ENCOUNTER — Ambulatory Visit (HOSPITAL_BASED_OUTPATIENT_CLINIC_OR_DEPARTMENT_OTHER): Payer: Medicare Other

## 2014-08-09 ENCOUNTER — Ambulatory Visit (HOSPITAL_BASED_OUTPATIENT_CLINIC_OR_DEPARTMENT_OTHER): Payer: Medicare Other | Admitting: Hematology and Oncology

## 2014-08-09 ENCOUNTER — Other Ambulatory Visit (HOSPITAL_BASED_OUTPATIENT_CLINIC_OR_DEPARTMENT_OTHER): Payer: Medicare Other

## 2014-08-09 ENCOUNTER — Encounter: Payer: Self-pay | Admitting: Hematology and Oncology

## 2014-08-09 ENCOUNTER — Telehealth: Payer: Self-pay | Admitting: Hematology and Oncology

## 2014-08-09 VITALS — BP 140/56 | HR 84 | Temp 98.6°F | Resp 18

## 2014-08-09 VITALS — BP 153/61 | HR 87 | Temp 98.2°F | Resp 18 | Ht 64.0 in | Wt 159.3 lb

## 2014-08-09 DIAGNOSIS — C8589 Other specified types of non-Hodgkin lymphoma, extranodal and solid organ sites: Secondary | ICD-10-CM

## 2014-08-09 DIAGNOSIS — D630 Anemia in neoplastic disease: Secondary | ICD-10-CM | POA: Diagnosis not present

## 2014-08-09 DIAGNOSIS — E1121 Type 2 diabetes mellitus with diabetic nephropathy: Secondary | ICD-10-CM

## 2014-08-09 DIAGNOSIS — Z5112 Encounter for antineoplastic immunotherapy: Secondary | ICD-10-CM | POA: Diagnosis not present

## 2014-08-09 DIAGNOSIS — N183 Chronic kidney disease, stage 3 unspecified: Secondary | ICD-10-CM

## 2014-08-09 DIAGNOSIS — C8580 Other specified types of non-Hodgkin lymphoma, unspecified site: Secondary | ICD-10-CM

## 2014-08-09 DIAGNOSIS — I10 Essential (primary) hypertension: Secondary | ICD-10-CM

## 2014-08-09 LAB — CBC WITH DIFFERENTIAL/PLATELET
BASO%: 0.6 % (ref 0.0–2.0)
BASOS ABS: 0 10*3/uL (ref 0.0–0.1)
EOS ABS: 0.1 10*3/uL (ref 0.0–0.5)
EOS%: 1.8 % (ref 0.0–7.0)
HCT: 32 % — ABNORMAL LOW (ref 34.8–46.6)
HGB: 10.6 g/dL — ABNORMAL LOW (ref 11.6–15.9)
LYMPH#: 1 10*3/uL (ref 0.9–3.3)
LYMPH%: 22.4 % (ref 14.0–49.7)
MCH: 26.6 pg (ref 25.1–34.0)
MCHC: 33.2 g/dL (ref 31.5–36.0)
MCV: 80.1 fL (ref 79.5–101.0)
MONO#: 0.3 10*3/uL (ref 0.1–0.9)
MONO%: 6.9 % (ref 0.0–14.0)
NEUT#: 3 10*3/uL (ref 1.5–6.5)
NEUT%: 68.3 % (ref 38.4–76.8)
Platelets: 244 10*3/uL (ref 145–400)
RBC: 3.99 10*6/uL (ref 3.70–5.45)
RDW: 17.5 % — AB (ref 11.2–14.5)
WBC: 4.4 10*3/uL (ref 3.9–10.3)

## 2014-08-09 LAB — WHOLE BLOOD GLUCOSE
Glucose: 202 mg/dL — ABNORMAL HIGH (ref 70–100)
HRS PC: 1 Hours

## 2014-08-09 LAB — COMPREHENSIVE METABOLIC PANEL (CC13)
ALK PHOS: 97 U/L (ref 40–150)
ALT: 15 U/L (ref 0–55)
AST: 15 U/L (ref 5–34)
Albumin: 3.6 g/dL (ref 3.5–5.0)
Anion Gap: 9 mEq/L (ref 3–11)
BILIRUBIN TOTAL: 0.52 mg/dL (ref 0.20–1.20)
BUN: 21.6 mg/dL (ref 7.0–26.0)
CALCIUM: 9.4 mg/dL (ref 8.4–10.4)
CHLORIDE: 104 meq/L (ref 98–109)
CO2: 23 mEq/L (ref 22–29)
Creatinine: 1.3 mg/dL — ABNORMAL HIGH (ref 0.6–1.1)
EGFR: 39 mL/min/{1.73_m2} — ABNORMAL LOW (ref >90)
Glucose: 413 mg/dl — ABNORMAL HIGH (ref 70–140)
POTASSIUM: 4.5 meq/L (ref 3.5–5.1)
SODIUM: 137 meq/L (ref 136–145)
Total Protein: 7 g/dL (ref 6.4–8.3)

## 2014-08-09 LAB — LACTATE DEHYDROGENASE (CC13): LDH: 136 U/L (ref 125–245)

## 2014-08-09 MED ORDER — ACETAMINOPHEN 325 MG PO TABS
650.0000 mg | ORAL_TABLET | Freq: Once | ORAL | Status: AC
  Administered 2014-08-09: 650 mg via ORAL

## 2014-08-09 MED ORDER — DIPHENHYDRAMINE HCL 25 MG PO CAPS
50.0000 mg | ORAL_CAPSULE | Freq: Once | ORAL | Status: AC
  Administered 2014-08-09: 50 mg via ORAL

## 2014-08-09 MED ORDER — SODIUM CHLORIDE 0.9 % IV SOLN
Freq: Once | INTRAVENOUS | Status: AC
  Administered 2014-08-09: 12:00:00 via INTRAVENOUS

## 2014-08-09 MED ORDER — SODIUM CHLORIDE 0.9 % IV SOLN
375.0000 mg/m2 | Freq: Once | INTRAVENOUS | Status: AC
  Administered 2014-08-09: 700 mg via INTRAVENOUS
  Filled 2014-08-09: qty 70

## 2014-08-09 MED ORDER — DIPHENHYDRAMINE HCL 25 MG PO CAPS
ORAL_CAPSULE | ORAL | Status: AC
  Filled 2014-08-09: qty 2

## 2014-08-09 MED ORDER — HEPARIN SOD (PORK) LOCK FLUSH 100 UNIT/ML IV SOLN
500.0000 [IU] | Freq: Once | INTRAVENOUS | Status: DC | PRN
  Filled 2014-08-09: qty 5

## 2014-08-09 MED ORDER — SODIUM CHLORIDE 0.9 % IJ SOLN
10.0000 mL | INTRAMUSCULAR | Status: DC | PRN
  Filled 2014-08-09: qty 10

## 2014-08-09 MED ORDER — INSULIN REGULAR HUMAN 100 UNIT/ML IJ SOLN
12.0000 [IU] | Freq: Once | INTRAMUSCULAR | Status: AC
  Administered 2014-08-09: 12 [IU] via SUBCUTANEOUS
  Filled 2014-08-09: qty 0.12

## 2014-08-09 MED ORDER — ACETAMINOPHEN 325 MG PO TABS
ORAL_TABLET | ORAL | Status: AC
  Filled 2014-08-09: qty 2

## 2014-08-09 NOTE — Assessment & Plan Note (Signed)
Her kidney function tests is stable. Continue close monitoring. There is no contraindication to continue treatment for now.  

## 2014-08-09 NOTE — Assessment & Plan Note (Signed)
She tolerated treatment well with resolution of splenomegaly. After she completes treatment today, I plan to repeat PET CT scan in 8 weeks to assess response to treatment. After that, I plan to schedule maintenance therapy every 8 weeks for 2 years to prevent reoccurrence

## 2014-08-09 NOTE — Progress Notes (Signed)
[facility] OFFICE PROGRESS NOTE  Patient Care Team: Aretta Nip, MD as PCP - General (Family Medicine) Sharyne Peach, MD as Consulting Physician (Ophthalmology) Inda Castle, MD as Consulting Physician (Gastroenterology) Theodis Sato, MD as Consulting Physician (Orthopedic Surgery) Jannette Spanner, MD as Referring Physician (Dermatology) Heath Lark, MD as Consulting Physician (Hematology and Oncology) Alphonsa Overall, MD as Consulting Physician (General Surgery)  SUMMARY OF ONCOLOGIC HISTORY: Oncology History   Marginal zone lymphoma, with lymph node and bone marrow involvement along with splenomegaly   Primary site: Lymphoid Neoplasms   Staging method: AJCC 6th Edition   Clinical: Stage IV signed by Heath Lark, MD on 09/02/2013  7:38 PM   Summary: Stage IV       Marginal zone lymphoma   07/29/2013 Imaging CT scan shows splenomegaly and abnormal lymphadenopathy.   08/13/2013 Imaging PET CT scan confirmed lymphadenopathy and splenomegaly, those areas are PET avid   08/23/2013 Surgery [id number] left axillary lymph node biopsy confirmed a low-grade lymphoma.   08/27/2013 Bone Marrow Biopsy Bone marrow biopsy confirmed lymphoma.[id number]   09/08/2013 - 09/29/2013 Chemotherapy She completed 4 cycles of rituximab with resolution of splenomegaly.   12/29/2013 Imaging PET scan show complete response.   07/11/2014 Imaging PET scan showed recurrent disease   07/19/2014 - 08/09/2014 Chemotherapy She completed 4 cycles of rituximab with resolution of splenomegaly    INTERVAL HISTORY: Please see below for problem oriented charting. She returns for further follow-up. She had transient mouth sores last week which resolved. Denies new lymphadenopathy. She complained of fatigue. Denies recent infection  REVIEW OF SYSTEMS:   Constitutional: Denies fevers, chills or abnormal weight loss Eyes: Denies blurriness of vision Ears, nose, mouth, throat, and face: Denies mucositis or sore  throat Respiratory: Denies cough, dyspnea or wheezes Cardiovascular: Denies palpitation, chest discomfort or lower extremity swelling Gastrointestinal:  Denies nausea, heartburn or change in bowel habits Skin: Denies abnormal skin rashes Lymphatics: Denies new lymphadenopathy or easy bruising Neurological:Denies numbness, tingling or new weaknesses Behavioral/Psych: Mood is stable, no new changes  All other systems were reviewed with the patient and are negative.  I have reviewed the past medical history, past surgical history, social history and family history with the patient and they are unchanged from previous note.  ALLERGIES:  is allergic to aspirin; capoten; levemir; micronase; onglyza; statins; and zocor.  MEDICATIONS:  Current Outpatient Prescriptions  Medication Sig Dispense Refill  . allopurinol (ZYLOPRIM) 300 MG tablet Take 1 tablet (300 mg total) by mouth daily. 30 tablet 0  . Calcium Carbonate-Vit D-Min (CALCIUM 1200 PO) Take 1 tablet by mouth daily.    . cholecalciferol (VITAMIN D) 1000 UNITS tablet Take 1,000 Units by mouth every Monday, Wednesday, and Friday. Three times per week    . Cinnamon 500 MG capsule Take 500 mg by mouth daily.    . enalapril (VASOTEC) 20 MG tablet Take 20 mg by mouth every morning.    . fish oil-omega-3 fatty acids 1000 MG capsule Take 1 g by mouth daily.     Marland Kitchen glimepiride (AMARYL) 4 MG tablet Take 4 mg by mouth daily with breakfast.    . glucose blood test strip Use as instructed 100 each 12  . Magnesium 500 MG CAPS Take 500 mg by mouth daily.     . metFORMIN (GLUCOPHAGE) 1000 MG tablet Take 1,000 mg by mouth daily with breakfast.    . Misc Natural Products (LUTEIN 20 PO) Take by mouth daily.    . Multiple Vitamins-Minerals (  CENTRUM CARDIO) TABS Take 1 tablet by mouth daily.    . Multiple Vitamins-Minerals (ICAPS) CAPS Take by mouth daily.    . Red Yeast Rice 600 MG TABS Take 600 mg by mouth daily.      No current facility-administered  medications for this visit.    PHYSICAL EXAMINATION: ECOG PERFORMANCE STATUS: 1 - Symptomatic but completely ambulatory  Filed Vitals:   08/09/14 1010  BP: 153/61  Pulse: 87  Temp: 98.2 F (36.8 C)  Resp: 18   Filed Weights   08/09/14 1010  Weight: 159 lb 4.8 oz (72.258 kg)    GENERAL:alert, no distress and comfortable SKIN: skin color, texture, turgor are normal, no rashes or significant lesions EYES: normal, Conjunctiva are pink and non-injected, sclera clear OROPHARYNX:no exudate, no erythema and lips, buccal mucosa, and tongue normal  NECK: supple, thyroid normal size, non-tender, without nodularity LYMPH:  no palpable lymphadenopathy in the cervical, axillary or inguinal LUNGS: clear to auscultation and percussion with normal breathing effort HEART: regular rate & rhythm and no murmurs and no lower extremity edema ABDOMEN:abdomen soft, non-tender and normal bowel sounds. No splenomegaly Musculoskeletal:no cyanosis of digits and no clubbing  NEURO: alert & oriented x 3 with fluent speech, no focal motor/sensory deficits  LABORATORY DATA:  I have reviewed the data as listed    Component Value Date/Time   NA 137 08/09/2014 1000   NA 137 08/27/2013 0710   K 4.5 08/09/2014 1000   K 4.3 08/27/2013 0710   CL 100 08/27/2013 0710   CO2 23 08/09/2014 1000   CO2 22 08/27/2013 0710   GLUCOSE 413* 08/09/2014 1000   GLUCOSE 180* 08/27/2013 0710   BUN 21.6 08/09/2014 1000   BUN 16 08/27/2013 0710   CREATININE 1.3* 08/09/2014 1000   CREATININE 0.87 08/27/2013 0710   CREATININE 1.12* 05/18/2013 1256   CALCIUM 9.4 08/09/2014 1000   CALCIUM 9.4 08/27/2013 0710   PROT 7.0 08/09/2014 1000   PROT 7.3 08/27/2013 0710   ALBUMIN 3.6 08/09/2014 1000   ALBUMIN 2.9* 08/27/2013 0710   AST 15 08/09/2014 1000   AST 18 08/27/2013 0710   ALT 15 08/09/2014 1000   ALT 16 08/27/2013 0710   ALKPHOS 97 08/09/2014 1000   ALKPHOS 134* 08/27/2013 0710   BILITOT 0.52 08/09/2014 1000    BILITOT 0.4 08/27/2013 0710   GFRNONAA 62* 08/27/2013 0710   GFRNONAA 47* 05/18/2013 1256   GFRAA 72* 08/27/2013 0710   GFRAA 55* 05/18/2013 1256    No results found for: SPEP, UPEP  Lab Results  Component Value Date   WBC 4.4 08/09/2014   NEUTROABS 3.0 08/09/2014   HGB 10.6* 08/09/2014   HCT 32.0* 08/09/2014   MCV 80.1 08/09/2014   PLT 244 08/09/2014      Chemistry      Component Value Date/Time   NA 137 08/09/2014 1000   NA 137 08/27/2013 0710   K 4.5 08/09/2014 1000   K 4.3 08/27/2013 0710   CL 100 08/27/2013 0710   CO2 23 08/09/2014 1000   CO2 22 08/27/2013 0710   BUN 21.6 08/09/2014 1000   BUN 16 08/27/2013 0710   CREATININE 1.3* 08/09/2014 1000   CREATININE 0.87 08/27/2013 0710   CREATININE 1.12* 05/18/2013 1256      Component Value Date/Time   CALCIUM 9.4 08/09/2014 1000   CALCIUM 9.4 08/27/2013 0710   ALKPHOS 97 08/09/2014 1000   ALKPHOS 134* 08/27/2013 0710   AST 15 08/09/2014 1000   AST 18 08/27/2013  0710   ALT 15 08/09/2014 1000   ALT 16 08/27/2013 0710   BILITOT 0.52 08/09/2014 1000   BILITOT 0.4 08/27/2013 0710      ASSESSMENT & PLAN:  Marginal zone lymphoma She tolerated treatment well with resolution of splenomegaly. After she completes treatment today, I plan to repeat PET CT scan in 8 weeks to assess response to treatment. After that, I plan to schedule maintenance therapy every 8 weeks for 2 years to prevent reoccurrence     Anemia in neoplastic disease This is likely anemia of chronic disease. The patient denies recent history of bleeding such as epistaxis, hematuria or hematochezia. She is asymptomatic from the anemia. We will observe for now.      Chronic kidney disease (CKD), stage III (moderate) Her kidney function tests is stable. Continue close monitoring. There is no contraindication to continue treatment for now.  Essential hypertension she will continue current medical management. I recommend close follow-up with primary  care doctor for medication adjustment.   Type 2 diabetes mellitus with diabetic nephropathy Her blood sugar is over 400. I will give her 12 units of insulin and recheck. To goal would be to try to get her blood sugar level under 300 before discharge today She will continue her medications at home     Orders Placed This Encounter  Procedures  . NM PET Image Restag (PS) Skull Base To Thigh    Standing Status: Future     Number of Occurrences:      Standing Expiration Date: 10/09/2015    Order Specific Question:  Reason for Exam (SYMPTOM  OR DIAGNOSIS REQUIRED)    Answer:  staging lymphoma assess response to Rx    Order Specific Question:  Preferred imaging location?    Answer:  [facility]  . CBC with Differential/Platelet    Standing Status: Future     Number of Occurrences:      Standing Expiration Date: 10/09/2015  . Comprehensive metabolic panel    Standing Status: Future     Number of Occurrences:      Standing Expiration Date: 10/09/2015  . Lactate dehydrogenase    Standing Status: Future     Number of Occurrences:      Standing Expiration Date: 10/09/2015   All questions were answered. The patient knows to call the clinic with any problems, questions or concerns. No barriers to learning was detected. I spent 25 minutes counseling the patient face to face. The total time spent in the appointment was 40 minutes and more than 50% was on counseling and review of test results     [facility], [place], MD 08/09/2014 10:59 AM

## 2014-08-09 NOTE — Assessment & Plan Note (Signed)
This is likely anemia of chronic disease. The patient denies recent history of bleeding such as epistaxis, hematuria or hematochezia. She is asymptomatic from the anemia. We will observe for now.  

## 2014-08-09 NOTE — Telephone Encounter (Signed)
Gave adn printed appt sched and avs for pt for Sept °

## 2014-08-09 NOTE — Patient Instructions (Signed)
[facility] Discharge Instructions for Patients Receiving Chemotherapy  Today you received the following chemotherapy agents Rituxan  To help prevent nausea and vomiting after your treatment, we encourage you to take your nausea medication as needed   If you develop nausea and vomiting that is not controlled by your nausea medication, call the clinic.   BELOW ARE SYMPTOMS THAT SHOULD BE REPORTED IMMEDIATELY:  *FEVER GREATER THAN 100.5 F  *CHILLS WITH OR WITHOUT FEVER  NAUSEA AND VOMITING THAT IS NOT CONTROLLED WITH YOUR NAUSEA MEDICATION  *UNUSUAL SHORTNESS OF BREATH  *UNUSUAL BRUISING OR BLEEDING  TENDERNESS IN MOUTH AND THROAT WITH OR WITHOUT PRESENCE OF ULCERS  *URINARY PROBLEMS  *BOWEL PROBLEMS  UNUSUAL RASH Items with * indicate a potential emergency and should be followed up as soon as possible.  Feel free to call the clinic you have any questions or concerns. The clinic phone number is [phone].  Please show the [place] at check-in to the Emergency Department and triage nurse.

## 2014-08-09 NOTE — Assessment & Plan Note (Signed)
she will continue current medical management. I recommend close follow-up with primary care doctor for medication adjustment.  

## 2014-08-09 NOTE — Assessment & Plan Note (Signed)
Her blood sugar is over 400. I will give her 12 units of insulin and recheck. To goal would be to try to get her blood sugar level under 300 before discharge today She will continue her medications at home

## 2014-08-10 ENCOUNTER — Other Ambulatory Visit: Payer: Self-pay | Admitting: Hematology and Oncology

## 2014-10-12 ENCOUNTER — Other Ambulatory Visit (HOSPITAL_BASED_OUTPATIENT_CLINIC_OR_DEPARTMENT_OTHER): Payer: Medicare Other

## 2014-10-12 ENCOUNTER — Ambulatory Visit (HOSPITAL_COMMUNITY)
Admission: RE | Admit: 2014-10-12 | Discharge: 2014-10-12 | Disposition: A | Discharge status: Home / Self Care | Payer: Medicare Other | Source: Ambulatory Visit | Attending: Hematology and Oncology | Admitting: Hematology and Oncology

## 2014-10-12 DIAGNOSIS — C8589 Other specified types of non-Hodgkin lymphoma, extranodal and solid organ sites: Secondary | ICD-10-CM

## 2014-10-12 DIAGNOSIS — C8580 Other specified types of non-Hodgkin lymphoma, unspecified site: Secondary | ICD-10-CM

## 2014-10-12 LAB — CBC WITH DIFFERENTIAL/PLATELET
BASO%: 0.7 % (ref 0.0–2.0)
Basophils Absolute: 0 10*3/uL (ref 0.0–0.1)
EOS%: 2.4 % (ref 0.0–7.0)
Eosinophils Absolute: 0.1 10*3/uL (ref 0.0–0.5)
HCT: 34 % — ABNORMAL LOW (ref 34.8–46.6)
HGB: 11.7 g/dL (ref 11.6–15.9)
LYMPH%: 25.7 % (ref 14.0–49.7)
MCH: 29 pg (ref 25.1–34.0)
MCHC: 34.4 g/dL (ref 31.5–36.0)
MCV: 84.2 fL (ref 79.5–101.0)
MONO#: 0.4 10*3/uL (ref 0.1–0.9)
MONO%: 7.7 % (ref 0.0–14.0)
NEUT%: 63.5 % (ref 38.4–76.8)
NEUTROS ABS: 2.9 10*3/uL (ref 1.5–6.5)
Platelets: 173 10*3/uL (ref 145–400)
RBC: 4.04 10*6/uL (ref 3.70–5.45)
RDW: 14.9 % — ABNORMAL HIGH (ref 11.2–14.5)
WBC: 4.6 10*3/uL (ref 3.9–10.3)
lymph#: 1.2 10*3/uL (ref 0.9–3.3)

## 2014-10-12 LAB — COMPREHENSIVE METABOLIC PANEL (CC13)
ALT: 18 U/L (ref 0–55)
AST: 17 U/L (ref 5–34)
Albumin: 3.7 g/dL (ref 3.5–5.0)
Alkaline Phosphatase: 67 U/L (ref 40–150)
Anion Gap: 8 mEq/L (ref 3–11)
BILIRUBIN TOTAL: 0.44 mg/dL (ref 0.20–1.20)
BUN: 18.8 mg/dL (ref 7.0–26.0)
CO2: 27 meq/L (ref 22–29)
CREATININE: 1 mg/dL (ref 0.6–1.1)
Calcium: 9.3 mg/dL (ref 8.4–10.4)
Chloride: 107 mEq/L (ref 98–109)
EGFR: 52 mL/min/{1.73_m2} — ABNORMAL LOW (ref >90)
GLUCOSE: 175 mg/dL — AB (ref 70–140)
Potassium: 4.3 mEq/L (ref 3.5–5.1)
SODIUM: 141 meq/L (ref 136–145)
TOTAL PROTEIN: 6.7 g/dL (ref 6.4–8.3)

## 2014-10-12 LAB — GLUCOSE, CAPILLARY: GLUCOSE-CAPILLARY: 187 mg/dL — AB (ref 65–99)

## 2014-10-12 LAB — LACTATE DEHYDROGENASE (CC13): LDH: 140 U/L (ref 125–245)

## 2014-10-12 MED ORDER — FLUDEOXYGLUCOSE F - 18 (FDG) INJECTION
7.9200 | Freq: Once | INTRAVENOUS | Status: DC | PRN
  Administered 2014-10-12: 7.92 via INTRAVENOUS
  Filled 2014-10-12: qty 7.92

## 2014-10-13 ENCOUNTER — Ambulatory Visit (HOSPITAL_BASED_OUTPATIENT_CLINIC_OR_DEPARTMENT_OTHER): Payer: Medicare Other | Admitting: Hematology and Oncology

## 2014-10-13 ENCOUNTER — Other Ambulatory Visit: Payer: Medicare Other

## 2014-10-13 ENCOUNTER — Telehealth: Payer: Self-pay | Admitting: Hematology and Oncology

## 2014-10-13 ENCOUNTER — Encounter: Payer: Self-pay | Admitting: Hematology and Oncology

## 2014-10-13 ENCOUNTER — Ambulatory Visit (HOSPITAL_BASED_OUTPATIENT_CLINIC_OR_DEPARTMENT_OTHER): Payer: Medicare Other

## 2014-10-13 ENCOUNTER — Telehealth: Payer: Self-pay | Admitting: *Deleted

## 2014-10-13 VITALS — BP 129/62 | HR 79 | Temp 98.1°F | Resp 16

## 2014-10-13 VITALS — BP 158/58 | HR 82 | Temp 98.7°F | Resp 17 | Ht 64.0 in | Wt 161.4 lb

## 2014-10-13 DIAGNOSIS — C8580 Other specified types of non-Hodgkin lymphoma, unspecified site: Secondary | ICD-10-CM

## 2014-10-13 DIAGNOSIS — R5383 Other fatigue: Secondary | ICD-10-CM | POA: Diagnosis not present

## 2014-10-13 DIAGNOSIS — N183 Chronic kidney disease, stage 3 unspecified: Secondary | ICD-10-CM

## 2014-10-13 DIAGNOSIS — Z5112 Encounter for antineoplastic immunotherapy: Secondary | ICD-10-CM | POA: Diagnosis not present

## 2014-10-13 DIAGNOSIS — I10 Essential (primary) hypertension: Secondary | ICD-10-CM

## 2014-10-13 MED ORDER — SODIUM CHLORIDE 0.9 % IV SOLN
Freq: Once | INTRAVENOUS | Status: AC
  Administered 2014-10-13: 11:00:00 via INTRAVENOUS

## 2014-10-13 MED ORDER — DIPHENHYDRAMINE HCL 25 MG PO CAPS
ORAL_CAPSULE | ORAL | Status: AC
  Filled 2014-10-13: qty 2

## 2014-10-13 MED ORDER — ACETAMINOPHEN 325 MG PO TABS
ORAL_TABLET | ORAL | Status: AC
  Filled 2014-10-13: qty 2

## 2014-10-13 MED ORDER — ACETAMINOPHEN 325 MG PO TABS
650.0000 mg | ORAL_TABLET | Freq: Once | ORAL | Status: AC
  Administered 2014-10-13: 650 mg via ORAL

## 2014-10-13 MED ORDER — SODIUM CHLORIDE 0.9 % IV SOLN
375.0000 mg/m2 | Freq: Once | INTRAVENOUS | Status: AC
  Administered 2014-10-13: 700 mg via INTRAVENOUS
  Filled 2014-10-13: qty 70

## 2014-10-13 MED ORDER — DIPHENHYDRAMINE HCL 25 MG PO CAPS
50.0000 mg | ORAL_CAPSULE | Freq: Once | ORAL | Status: AC
  Administered 2014-10-13: 50 mg via ORAL

## 2014-10-13 NOTE — Assessment & Plan Note (Addendum)
She tolerated treatment well with resolution of splenomegaly. PET CT scan showed near complete response to treatment. I plan to schedule maintenance therapy every 8 weeks for 2 years to prevent recurrence We will proceed with treatment today without dose adjustment.

## 2014-10-13 NOTE — Progress Notes (Signed)
[facility] OFFICE PROGRESS NOTE  Patient Care Team: Aretta Nip, MD as PCP - General (Family Medicine) Sharyne Peach, MD as Consulting Physician (Ophthalmology) Inda Castle, MD as Consulting Physician (Gastroenterology) Theodis Sato, MD as Consulting Physician (Orthopedic Surgery) Jannette Spanner, MD as Referring Physician (Dermatology) Heath Lark, MD as Consulting Physician (Hematology and Oncology) Alphonsa Overall, MD as Consulting Physician (General Surgery)  SUMMARY OF ONCOLOGIC HISTORY: Oncology History   Marginal zone lymphoma, with lymph node and bone marrow involvement along with splenomegaly   Primary site: Lymphoid Neoplasms   Staging method: AJCC 6th Edition   Clinical: Stage IV signed by Heath Lark, MD on 09/02/2013  7:38 PM   Summary: Stage IV       Marginal zone lymphoma   07/29/2013 Imaging CT scan shows splenomegaly and abnormal lymphadenopathy.   08/13/2013 Imaging PET CT scan confirmed lymphadenopathy and splenomegaly, those areas are PET avid   08/23/2013 Surgery [id number] left axillary lymph node biopsy confirmed a low-grade lymphoma.   08/27/2013 Bone Marrow Biopsy Bone marrow biopsy confirmed lymphoma.[id number]   09/08/2013 - 09/29/2013 Chemotherapy She completed 4 cycles of rituximab with resolution of splenomegaly.   12/29/2013 Imaging PET scan show complete response.   07/11/2014 Imaging PET scan showed recurrent disease   07/19/2014 - 08/09/2014 Chemotherapy She completed 4 cycles of rituximab with resolution of splenomegaly   10/12/2014 Imaging Repeat PET/CT scan showed near complete response to treatment.   10/13/2014 -  Chemotherapy She is placed on rituximab maintenance therapy.    INTERVAL HISTORY: Please see below for problem oriented charting. She returns for further follow-up. She is doing very well. Denies new lymphadenopathy. Denies recent infection. She continues to have mild fatigue.  REVIEW OF SYSTEMS:   Constitutional:  Denies fevers, chills or abnormal weight loss Eyes: Denies blurriness of vision Ears, nose, mouth, throat, and face: Denies mucositis or sore throat Respiratory: Denies cough, dyspnea or wheezes Cardiovascular: Denies palpitation, chest discomfort or lower extremity swelling Gastrointestinal:  Denies nausea, heartburn or change in bowel habits Skin: Denies abnormal skin rashes Lymphatics: Denies new lymphadenopathy or easy bruising Neurological:Denies numbness, tingling or new weaknesses Behavioral/Psych: Mood is stable, no new changes  All other systems were reviewed with the patient and are negative.  I have reviewed the past medical history, past surgical history, social history and family history with the patient and they are unchanged from previous note.  ALLERGIES:  is allergic to aspirin; capoten; levemir; micronase; onglyza; statins; and zocor.  MEDICATIONS:  Current Outpatient Prescriptions  Medication Sig Dispense Refill  . aspirin 81 MG tablet Take 40.5 mg by mouth 3 (three) times a week.    . Calcium Carbonate-Vit D-Min (CALCIUM 1200 PO) Take 1 tablet by mouth daily.    . cholecalciferol (VITAMIN D) 1000 UNITS tablet Take 1,000 Units by mouth every Monday, Wednesday, and Friday. Three times per week    . Cinnamon 500 MG capsule Take 500 mg by mouth daily.    . enalapril (VASOTEC) 20 MG tablet Take 20 mg by mouth every morning.    . fish oil-omega-3 fatty acids 1000 MG capsule Take 1 g by mouth daily.     Marland Kitchen glimepiride (AMARYL) 4 MG tablet Take 4 mg by mouth daily with breakfast.    . glucose blood test strip Use as instructed 100 each 12  . Magnesium 500 MG CAPS Take 500 mg by mouth daily.     . metFORMIN (GLUCOPHAGE) 1000 MG tablet Take 1,000 mg by mouth  daily with breakfast.    . Misc Natural Products (LUTEIN 20 PO) Take by mouth daily.    . Multiple Vitamins-Minerals (CENTRUM CARDIO) TABS Take 1 tablet by mouth daily.    . Multiple Vitamins-Minerals (ICAPS) CAPS Take by  mouth daily.    . Red Yeast Rice 600 MG TABS Take 600 mg by mouth daily.      No current facility-administered medications for this visit.   Facility-Administered Medications Ordered in Other Visits  Medication Dose Route Frequency Provider Last Rate Last Dose  . fludeoxyglucose F - 18 (FDG) injection 7.92 milli Curie  7.92 milli Curie Intravenous Once PRN Medication Radiologist, MD   7.92 milli Curie at 10/12/14 1032    PHYSICAL EXAMINATION: ECOG PERFORMANCE STATUS: 1 - Symptomatic but completely ambulatory  Filed Vitals:   10/13/14 1024  BP: 158/58  Pulse: 82  Temp: 98.7 F (37.1 C)  Resp: 17   Filed Weights   10/13/14 1024  Weight: 161 lb 6.4 oz (73.211 kg)    GENERAL:alert, no distress and comfortable SKIN: skin color, texture, turgor are normal, no rashes or significant lesions EYES: normal, Conjunctiva are pink and non-injected, sclera clear ABDOMEN:abdomen soft, non-tender and normal bowel sounds Musculoskeletal:no cyanosis of digits and no clubbing  NEURO: alert & oriented x 3 with fluent speech, no focal motor/sensory deficits  LABORATORY DATA:  I have reviewed the data as listed    Component Value Date/Time   NA 141 10/12/2014 0950   NA 137 08/27/2013 0710   K 4.3 10/12/2014 0950   K 4.3 08/27/2013 0710   CL 100 08/27/2013 0710   CO2 27 10/12/2014 0950   CO2 22 08/27/2013 0710   GLUCOSE 175* 10/12/2014 0950   GLUCOSE 180* 08/27/2013 0710   BUN 18.8 10/12/2014 0950   BUN 16 08/27/2013 0710   CREATININE 1.0 10/12/2014 0950   CREATININE 0.87 08/27/2013 0710   CREATININE 1.12* 05/18/2013 1256   CALCIUM 9.3 10/12/2014 0950   CALCIUM 9.4 08/27/2013 0710   PROT 6.7 10/12/2014 0950   PROT 7.3 08/27/2013 0710   ALBUMIN 3.7 10/12/2014 0950   ALBUMIN 2.9* 08/27/2013 0710   AST 17 10/12/2014 0950   AST 18 08/27/2013 0710   ALT 18 10/12/2014 0950   ALT 16 08/27/2013 0710   ALKPHOS 67 10/12/2014 0950   ALKPHOS 134* 08/27/2013 0710   BILITOT 0.44 10/12/2014  0950   BILITOT 0.4 08/27/2013 0710   GFRNONAA 62* 08/27/2013 0710   GFRNONAA 47* 05/18/2013 1256   GFRAA 72* 08/27/2013 0710   GFRAA 55* 05/18/2013 1256    No results found for: SPEP, UPEP  Lab Results  Component Value Date   WBC 4.6 10/12/2014   NEUTROABS 2.9 10/12/2014   HGB 11.7 10/12/2014   HCT 34.0* 10/12/2014   MCV 84.2 10/12/2014   PLT 173 10/12/2014      Chemistry      Component Value Date/Time   NA 141 10/12/2014 0950   NA 137 08/27/2013 0710   K 4.3 10/12/2014 0950   K 4.3 08/27/2013 0710   CL 100 08/27/2013 0710   CO2 27 10/12/2014 0950   CO2 22 08/27/2013 0710   BUN 18.8 10/12/2014 0950   BUN 16 08/27/2013 0710   CREATININE 1.0 10/12/2014 0950   CREATININE 0.87 08/27/2013 0710   CREATININE 1.12* 05/18/2013 1256   GLU 202* 08/09/2014 1000      Component Value Date/Time   CALCIUM 9.3 10/12/2014 0950   CALCIUM 9.4 08/27/2013 0710   ALKPHOS  67 10/12/2014 0950   ALKPHOS 134* 08/27/2013 0710   AST 17 10/12/2014 0950   AST 18 08/27/2013 0710   ALT 18 10/12/2014 0950   ALT 16 08/27/2013 0710   BILITOT 0.44 10/12/2014 0950   BILITOT 0.4 08/27/2013 0710       RADIOGRAPHIC STUDIES: I reviewed the imaging study with her and her son I have personally reviewed the radiological images as listed and agreed with the findings in the report. Nm Pet Image Restag (ps) Skull Base To Thigh  10/12/2014   CLINICAL DATA:  Subsequent treatment strategy for marginal zone lymphoma.  EXAM: NUCLEAR MEDICINE PET SKULL BASE TO THIGH  TECHNIQUE: 7.92 mCi F-18 FDG was injected intravenously. Full-ring PET imaging was performed from the skull base to thigh after the radiotracer. CT data was obtained and used for attenuation correction and anatomic localization.  FASTING BLOOD GLUCOSE:  Value: 187 mg/dl  COMPARISON:  PET-CT dated 07/11/2014  FINDINGS: NECK  No hypermetabolic lymph nodes in the neck.  CHEST  New complete resolution of prior hypermetabolic left axillary nodes. Residual 5  mm left axillary node (series 4/ image 46), max SUV 1.3, previously 9 mm with max SUV 5.0.  No hypermetabolic mediastinal lymphadenopathy.  No suspicious pulmonary nodules.  ABDOMEN/PELVIS  No abnormal hypermetabolic activity within the liver, pancreas, adrenal glands.  Spleen is now demonstrates max SUV 4.1, which is less than background metabolism of the liver, max SUV 4.7. Prior splenomegaly has also resolved.  No hypermetabolic lymph nodes in the abdomen or pelvis.  Prior hypermetabolic right para-aortic node now measures 4 mm short axis (series 4/image 115), previously 7 mm, and is no longer FDG avid. Prior hypermetabolic left external iliac node now measures 5 mm short axis (series 4/ image 155) and is no longer FDG avid with max SUV 1.6, previously 10 mm short axis with max SUV 4.6.  SKELETON  No focal hypermetabolic activity to suggest skeletal metastasis.  IMPRESSION: Complete metabolic response.  Small residual lymph nodes in the left axilla, para-aortic region, and left external iliac chain, without appreciable hypermetabolism.  Prior splenomegaly and splenic hypermetabolism have resolved.   Electronically Signed   By: Julian Hy M.D.   On: 10/12/2014 14:09     ASSESSMENT & PLAN:  Marginal zone lymphoma She tolerated treatment well with resolution of splenomegaly. PET CT scan showed near complete response to treatment. I plan to schedule maintenance therapy every 8 weeks for 2 years to prevent recurrence We will proceed with treatment today without dose adjustment.     Essential hypertension she will continue current medical management. I recommend close follow-up with primary care doctor for medication adjustment.     Chronic kidney disease (CKD), stage III (moderate) Her kidney function tests is stable. Continue close monitoring. There is no contraindication to continue treatment for now.   Other fatigue This could be related to recent treatment and recovery of her bone  marrow, her blood count and splenomegaly. I recommend gentle exercise as tolerated.     Orders Placed This Encounter  Procedures  . CBC with Differential/Platelet    Standing Status: Future     Number of Occurrences:      Standing Expiration Date: 11/17/2015  . Comprehensive metabolic panel    Standing Status: Future     Number of Occurrences:      Standing Expiration Date: 11/17/2015  . Lactate dehydrogenase    Standing Status: Future     Number of Occurrences:  Standing Expiration Date: 11/17/2015   All questions were answered. The patient knows to call the clinic with any problems, questions or concerns. No barriers to learning was detected. I spent 25 minutes counseling the patient face to face. The total time spent in the appointment was 30 minutes and more than 50% was on counseling and review of test results     [facility], [place], MD 10/13/2014 5:28 PM

## 2014-10-13 NOTE — Assessment & Plan Note (Signed)
Her kidney function tests is stable. Continue close monitoring. There is no contraindication to continue treatment for now.  

## 2014-10-13 NOTE — Assessment & Plan Note (Signed)
she will continue current medical management. I recommend close follow-up with primary care doctor for medication adjustment.  

## 2014-10-13 NOTE — Patient Instructions (Signed)
[facility] Discharge Instructions for Patients Receiving Chemotherapy  Today you received the following chemotherapy agents rituxan  To help prevent nausea and vomiting after your treatment, we encourage you to take your nausea medication   If you develop nausea and vomiting that is not controlled by your nausea medication, call the clinic.   BELOW ARE SYMPTOMS THAT SHOULD BE REPORTED IMMEDIATELY:  *FEVER GREATER THAN 100.5 F  *CHILLS WITH OR WITHOUT FEVER  NAUSEA AND VOMITING THAT IS NOT CONTROLLED WITH YOUR NAUSEA MEDICATION  *UNUSUAL SHORTNESS OF BREATH  *UNUSUAL BRUISING OR BLEEDING  TENDERNESS IN MOUTH AND THROAT WITH OR WITHOUT PRESENCE OF ULCERS  *URINARY PROBLEMS  *BOWEL PROBLEMS  UNUSUAL RASH Items with * indicate a potential emergency and should be followed up as soon as possible.  Feel free to call the clinic you have any questions or concerns. The clinic phone number is [phone].  Please show the CHEMO ALERT CARD at check-in to the Emergency Department and triage nurse.   

## 2014-10-13 NOTE — Assessment & Plan Note (Signed)
This could be related to recent treatment and recovery of her bone marrow, her blood count and splenomegaly. I recommend gentle exercise as tolerated.

## 2014-10-13 NOTE — Telephone Encounter (Signed)
per pof to schpt appt-MW rmail to sch trmt-pt to get avs in trmt room

## 2014-10-13 NOTE — Telephone Encounter (Signed)
Per staff message and POF I have scheduled appts. Advised scheduler of appts. JMW  

## 2014-12-13 ENCOUNTER — Ambulatory Visit (HOSPITAL_BASED_OUTPATIENT_CLINIC_OR_DEPARTMENT_OTHER): Payer: Medicare Other | Admitting: Hematology and Oncology

## 2014-12-13 ENCOUNTER — Telehealth: Payer: Self-pay | Admitting: *Deleted

## 2014-12-13 ENCOUNTER — Encounter: Payer: Self-pay | Admitting: Hematology and Oncology

## 2014-12-13 ENCOUNTER — Telehealth: Payer: Self-pay | Admitting: Hematology and Oncology

## 2014-12-13 ENCOUNTER — Other Ambulatory Visit (HOSPITAL_BASED_OUTPATIENT_CLINIC_OR_DEPARTMENT_OTHER): Payer: Medicare Other

## 2014-12-13 ENCOUNTER — Ambulatory Visit (HOSPITAL_BASED_OUTPATIENT_CLINIC_OR_DEPARTMENT_OTHER): Payer: Medicare Other

## 2014-12-13 VITALS — BP 155/60 | HR 82 | Temp 98.6°F | Resp 18 | Ht 64.0 in | Wt 167.5 lb

## 2014-12-13 VITALS — BP 154/63 | HR 80 | Temp 98.4°F | Resp 20

## 2014-12-13 DIAGNOSIS — C8580 Other specified types of non-Hodgkin lymphoma, unspecified site: Secondary | ICD-10-CM

## 2014-12-13 DIAGNOSIS — Z5112 Encounter for antineoplastic immunotherapy: Secondary | ICD-10-CM

## 2014-12-13 DIAGNOSIS — N183 Chronic kidney disease, stage 3 unspecified: Secondary | ICD-10-CM

## 2014-12-13 DIAGNOSIS — I10 Essential (primary) hypertension: Secondary | ICD-10-CM

## 2014-12-13 LAB — CBC WITH DIFFERENTIAL/PLATELET
BASO%: 0.2 % (ref 0.0–2.0)
Basophils Absolute: 0 10*3/uL (ref 0.0–0.1)
EOS ABS: 0.1 10*3/uL (ref 0.0–0.5)
EOS%: 2.9 % (ref 0.0–7.0)
HCT: 35.4 % (ref 34.8–46.6)
HEMOGLOBIN: 12.1 g/dL (ref 11.6–15.9)
LYMPH%: 20.9 % (ref 14.0–49.7)
MCH: 29.7 pg (ref 25.1–34.0)
MCHC: 34.2 g/dL (ref 31.5–36.0)
MCV: 87 fL (ref 79.5–101.0)
MONO#: 0.3 10*3/uL (ref 0.1–0.9)
MONO%: 7.6 % (ref 0.0–14.0)
NEUT%: 68.4 % (ref 38.4–76.8)
NEUTROS ABS: 3.1 10*3/uL (ref 1.5–6.5)
Platelets: 191 10*3/uL (ref 145–400)
RBC: 4.07 10*6/uL (ref 3.70–5.45)
RDW: 13.4 % (ref 11.2–14.5)
WBC: 4.5 10*3/uL (ref 3.9–10.3)
lymph#: 0.9 10*3/uL (ref 0.9–3.3)

## 2014-12-13 LAB — COMPREHENSIVE METABOLIC PANEL (CC13)
ALBUMIN: 3.5 g/dL (ref 3.5–5.0)
ALT: 15 U/L (ref 0–55)
ANION GAP: 12 meq/L — AB (ref 3–11)
AST: 13 U/L (ref 5–34)
Alkaline Phosphatase: 72 U/L (ref 40–150)
BILIRUBIN TOTAL: 0.46 mg/dL (ref 0.20–1.20)
BUN: 17.8 mg/dL (ref 7.0–26.0)
CO2: 22 mEq/L (ref 22–29)
CREATININE: 1.2 mg/dL — AB (ref 0.6–1.1)
Calcium: 9.2 mg/dL (ref 8.4–10.4)
Chloride: 106 mEq/L (ref 98–109)
EGFR: 43 mL/min/{1.73_m2} — ABNORMAL LOW (ref >90)
Glucose: 362 mg/dl — ABNORMAL HIGH (ref 70–140)
Potassium: 4.2 mEq/L (ref 3.5–5.1)
SODIUM: 139 meq/L (ref 136–145)
TOTAL PROTEIN: 6.7 g/dL (ref 6.4–8.3)

## 2014-12-13 LAB — LACTATE DEHYDROGENASE (CC13): LDH: 131 U/L (ref 125–245)

## 2014-12-13 MED ORDER — DIPHENHYDRAMINE HCL 25 MG PO CAPS
ORAL_CAPSULE | ORAL | Status: AC
  Filled 2014-12-13: qty 2

## 2014-12-13 MED ORDER — ACETAMINOPHEN 325 MG PO TABS
ORAL_TABLET | ORAL | Status: AC
  Filled 2014-12-13: qty 2

## 2014-12-13 MED ORDER — DIPHENHYDRAMINE HCL 25 MG PO CAPS
50.0000 mg | ORAL_CAPSULE | Freq: Once | ORAL | Status: AC
  Administered 2014-12-13: 50 mg via ORAL

## 2014-12-13 MED ORDER — ACETAMINOPHEN 325 MG PO TABS
650.0000 mg | ORAL_TABLET | Freq: Once | ORAL | Status: AC
  Administered 2014-12-13: 650 mg via ORAL

## 2014-12-13 MED ORDER — SODIUM CHLORIDE 0.9 % IV SOLN
375.0000 mg/m2 | Freq: Once | INTRAVENOUS | Status: AC
  Administered 2014-12-13: 700 mg via INTRAVENOUS
  Filled 2014-12-13: qty 70

## 2014-12-13 MED ORDER — SODIUM CHLORIDE 0.9 % IV SOLN
Freq: Once | INTRAVENOUS | Status: AC
  Administered 2014-12-13: 12:00:00 via INTRAVENOUS

## 2014-12-13 NOTE — Telephone Encounter (Signed)
per pof to sch pt appt-gave pt copy of avs-sent MW email to sch pt trmt °

## 2014-12-13 NOTE — Telephone Encounter (Signed)
Per staff message and POF I have scheduled appts. Advised scheduler of appts. JMW  

## 2014-12-13 NOTE — Progress Notes (Signed)
[place] OFFICE PROGRESS NOTE  Patient Care Team: Aretta Nip, MD as PCP - General (Family Medicine) Sharyne Peach, MD as Consulting Physician (Ophthalmology) Inda Castle, MD as Consulting Physician (Gastroenterology) Theodis Sato, MD as Consulting Physician (Orthopedic Surgery) Jannette Spanner, MD as Referring Physician (Dermatology) Heath Lark, MD as Consulting Physician (Hematology and Oncology) Alphonsa Overall, MD as Consulting Physician (General Surgery)  SUMMARY OF ONCOLOGIC HISTORY: Oncology History   Marginal zone lymphoma, with lymph node and bone marrow involvement along with splenomegaly   Primary site: Lymphoid Neoplasms   Staging method: AJCC 6th Edition   Clinical: Stage IV signed by Heath Lark, MD on 09/02/2013  7:38 PM   Summary: Stage IV       Marginal zone lymphoma ([place])   07/29/2013 Imaging CT scan shows splenomegaly and abnormal lymphadenopathy.   08/13/2013 Imaging PET CT scan confirmed lymphadenopathy and splenomegaly, those areas are PET avid   08/23/2013 Surgery [id number] left axillary lymph node biopsy confirmed a low-grade lymphoma.   08/27/2013 Bone Marrow Biopsy Bone marrow biopsy confirmed lymphoma.[id number]   09/08/2013 - 09/29/2013 Chemotherapy She completed 4 cycles of rituximab with resolution of splenomegaly.   12/29/2013 Imaging PET scan show complete response.   07/11/2014 Imaging PET scan showed recurrent disease   07/19/2014 - 08/09/2014 Chemotherapy She completed 4 cycles of rituximab with resolution of splenomegaly   10/12/2014 Imaging Repeat PET/CT scan showed near complete response to treatment.   10/13/2014 -  Chemotherapy She is placed on rituximab maintenance therapy.    INTERVAL HISTORY: Please see below for problem oriented charting.  she returns for further follow-up. She is doing well. Denies recent infection. No new lymphadenopathy.  REVIEW OF SYSTEMS:   Constitutional: Denies fevers, chills or abnormal weight  loss Eyes: Denies blurriness of vision Ears, nose, mouth, throat, and face: Denies mucositis or sore throat Respiratory: Denies cough, dyspnea or wheezes Cardiovascular: Denies palpitation, chest discomfort or lower extremity swelling Gastrointestinal:  Denies nausea, heartburn or change in bowel habits Skin: Denies abnormal skin rashes Lymphatics: Denies new lymphadenopathy or easy bruising Neurological:Denies numbness, tingling or new weaknesses Behavioral/Psych: Mood is stable, no new changes  All other systems were reviewed with the patient and are negative.  I have reviewed the past medical history, past surgical history, social history and family history with the patient and they are unchanged from previous note.  ALLERGIES:  is allergic to aspirin; capoten; levemir; micronase; onglyza; statins; and zocor.  MEDICATIONS:  Current Outpatient Prescriptions  Medication Sig Dispense Refill  . aspirin 81 MG tablet Take 40.5 mg by mouth 3 (three) times a week.    . Calcium Carbonate-Vit D-Min (CALCIUM 1200 PO) Take 1 tablet by mouth daily.    . cholecalciferol (VITAMIN D) 1000 UNITS tablet Take 1,000 Units by mouth every Monday, Wednesday, and Friday. Three times per week    . Cinnamon 500 MG capsule Take 500 mg by mouth daily.    . enalapril (VASOTEC) 20 MG tablet Take 20 mg by mouth every morning.    . fish oil-omega-3 fatty acids 1000 MG capsule Take 1 g by mouth daily.     Marland Kitchen glimepiride (AMARYL) 4 MG tablet Take 4 mg by mouth daily with breakfast.    . glucose blood test strip Use as instructed 100 each 12  . Magnesium 500 MG CAPS Take 500 mg by mouth daily.     . metFORMIN (GLUCOPHAGE) 1000 MG tablet Take 1,000 mg by mouth daily with breakfast.    .  Misc Natural Products (LUTEIN 20 PO) Take by mouth daily.    . Multiple Vitamins-Minerals (CENTRUM CARDIO) TABS Take 1 tablet by mouth daily.    . Multiple Vitamins-Minerals (ICAPS) CAPS Take by mouth daily.    . Red Yeast Rice 600 MG  TABS Take 600 mg by mouth daily.      No current facility-administered medications for this visit.    PHYSICAL EXAMINATION: ECOG PERFORMANCE STATUS: 0 - Asymptomatic  Filed Vitals:   12/13/14 1006  BP: 155/60  Pulse: 82  Temp: 98.6 F (37 C)  Resp: 18   Filed Weights   12/13/14 1006  Weight: 167 lb 8 oz (75.978 kg)    GENERAL:alert, no distress and comfortable SKIN: skin color, texture, turgor are normal, no rashes or significant lesions EYES: normal, Conjunctiva are pink and non-injected, sclera clear OROPHARYNX:no exudate, no erythema and lips, buccal mucosa, and tongue normal  NECK: supple, thyroid normal size, non-tender, without nodularity LYMPH:  no palpable lymphadenopathy in the cervical, axillary or inguinal LUNGS: clear to auscultation and percussion with normal breathing effort HEART: regular rate & rhythm and no murmurs and no lower extremity edema ABDOMEN:abdomen soft, non-tender and normal bowel sounds Musculoskeletal:no cyanosis of digits and no clubbing  NEURO: alert & oriented x 3 with fluent speech, no focal motor/sensory deficits  LABORATORY DATA:  I have reviewed the data as listed    Component Value Date/Time   NA 139 12/13/2014 0943   NA 137 08/27/2013 0710   K 4.2 12/13/2014 0943   K 4.3 08/27/2013 0710   CL 100 08/27/2013 0710   CO2 22 12/13/2014 0943   CO2 22 08/27/2013 0710   GLUCOSE 362* 12/13/2014 0943   GLUCOSE 180* 08/27/2013 0710   BUN 17.8 12/13/2014 0943   BUN 16 08/27/2013 0710   CREATININE 1.2* 12/13/2014 0943   CREATININE 0.87 08/27/2013 0710   CREATININE 1.12* 05/18/2013 1256   CALCIUM 9.2 12/13/2014 0943   CALCIUM 9.4 08/27/2013 0710   PROT 6.7 12/13/2014 0943   PROT 7.3 08/27/2013 0710   ALBUMIN 3.5 12/13/2014 0943   ALBUMIN 2.9* 08/27/2013 0710   AST 13 12/13/2014 0943   AST 18 08/27/2013 0710   ALT 15 12/13/2014 0943   ALT 16 08/27/2013 0710   ALKPHOS 72 12/13/2014 0943   ALKPHOS 134* 08/27/2013 0710   BILITOT  0.46 12/13/2014 0943   BILITOT 0.4 08/27/2013 0710   GFRNONAA 62* 08/27/2013 0710   GFRNONAA 47* 05/18/2013 1256   GFRAA 72* 08/27/2013 0710   GFRAA 55* 05/18/2013 1256    No results found for: SPEP, UPEP  Lab Results  Component Value Date   WBC 4.5 12/13/2014   NEUTROABS 3.1 12/13/2014   HGB 12.1 12/13/2014   HCT 35.4 12/13/2014   MCV 87.0 12/13/2014   PLT 191 12/13/2014      Chemistry      Component Value Date/Time   NA 139 12/13/2014 0943   NA 137 08/27/2013 0710   K 4.2 12/13/2014 0943   K 4.3 08/27/2013 0710   CL 100 08/27/2013 0710   CO2 22 12/13/2014 0943   CO2 22 08/27/2013 0710   BUN 17.8 12/13/2014 0943   BUN 16 08/27/2013 0710   CREATININE 1.2* 12/13/2014 0943   CREATININE 0.87 08/27/2013 0710   CREATININE 1.12* 05/18/2013 1256   GLU 202* 08/09/2014 1000      Component Value Date/Time   CALCIUM 9.2 12/13/2014 0943   CALCIUM 9.4 08/27/2013 0710   ALKPHOS 72 12/13/2014 0943  ALKPHOS 134* 08/27/2013 0710   AST 13 12/13/2014 0943   AST 18 08/27/2013 0710   ALT 15 12/13/2014 0943   ALT 16 08/27/2013 0710   BILITOT 0.46 12/13/2014 0943   BILITOT 0.4 08/27/2013 0710      ASSESSMENT & PLAN:  Marginal zone lymphoma She tolerated treatment well with resolution of splenomegaly. PET CT scan showed near complete response to treatment. I plan to schedule maintenance therapy every 8 weeks for 2 years to prevent recurrence We will proceed with treatment today without dose adjustment. She has no recurrence of anemia  Chronic kidney disease (CKD), stage III (moderate) Her kidney function tests is stable. Continue close monitoring. There is no contraindication to continue treatment for now.   Essential hypertension she will continue current medical management.  according to her, she has significant element of white coat hypertension. Her BP monitoring at home show blood pressure is within normal limits I recommend close follow-up with primary care doctor  for medication adjustment.     Orders Placed This Encounter  Procedures  . Comprehensive metabolic panel    Standing Status: Future     Number of Occurrences:      Standing Expiration Date: 01/17/2016  . CBC with Differential/Platelet    Standing Status: Future     Number of Occurrences:      Standing Expiration Date: 01/17/2016  . Lactate dehydrogenase    Standing Status: Future     Number of Occurrences:      Standing Expiration Date: 01/17/2016   All questions were answered. The patient knows to call the clinic with any problems, questions or concerns. No barriers to learning was detected. I spent 20 minutes counseling the patient face to face. The total time spent in the appointment was 25 minutes and more than 50% was on counseling and review of test results     [facility], [place], MD 12/13/2014 10:47 AM

## 2014-12-13 NOTE — Assessment & Plan Note (Signed)
she will continue current medical management.  according to her, she has significant element of white coat hypertension. Her BP monitoring at home show blood pressure is within normal limits I recommend close follow-up with primary care doctor for medication adjustment.

## 2014-12-13 NOTE — Assessment & Plan Note (Signed)
She tolerated treatment well with resolution of splenomegaly. PET CT scan showed near complete response to treatment. I plan to schedule maintenance therapy every 8 weeks for 2 years to prevent recurrence We will proceed with treatment today without dose adjustment. She has no recurrence of anemia

## 2014-12-13 NOTE — Patient Instructions (Signed)
[facility] Discharge Instructions for Patients Receiving Chemotherapy  Today you received the following chemotherapy agents Rituxan  To help prevent nausea and vomiting after your treatment, we encourage you to take your nausea medication as needed   If you develop nausea and vomiting that is not controlled by your nausea medication, call the clinic.   BELOW ARE SYMPTOMS THAT SHOULD BE REPORTED IMMEDIATELY:  *FEVER GREATER THAN 100.5 F  *CHILLS WITH OR WITHOUT FEVER  NAUSEA AND VOMITING THAT IS NOT CONTROLLED WITH YOUR NAUSEA MEDICATION  *UNUSUAL SHORTNESS OF BREATH  *UNUSUAL BRUISING OR BLEEDING  TENDERNESS IN MOUTH AND THROAT WITH OR WITHOUT PRESENCE OF ULCERS  *URINARY PROBLEMS  *BOWEL PROBLEMS  UNUSUAL RASH Items with * indicate a potential emergency and should be followed up as soon as possible.  Feel free to call the clinic you have any questions or concerns. The clinic phone number is [phone].  Please show the CHEMO ALERT CARD at check-in to the Emergency Department and triage nurse.   

## 2014-12-13 NOTE — Assessment & Plan Note (Signed)
Her kidney function tests is stable. Continue close monitoring. There is no contraindication to continue treatment for now.  

## 2015-01-31 ENCOUNTER — Ambulatory Visit: Payer: Medicare Other

## 2015-02-09 ENCOUNTER — Ambulatory Visit (HOSPITAL_BASED_OUTPATIENT_CLINIC_OR_DEPARTMENT_OTHER): Payer: Medicare Other | Admitting: Hematology and Oncology

## 2015-02-09 ENCOUNTER — Ambulatory Visit (HOSPITAL_BASED_OUTPATIENT_CLINIC_OR_DEPARTMENT_OTHER): Payer: Medicare Other

## 2015-02-09 ENCOUNTER — Telehealth: Payer: Self-pay | Admitting: Hematology and Oncology

## 2015-02-09 ENCOUNTER — Other Ambulatory Visit (HOSPITAL_BASED_OUTPATIENT_CLINIC_OR_DEPARTMENT_OTHER): Payer: Medicare Other

## 2015-02-09 ENCOUNTER — Encounter: Payer: Self-pay | Admitting: Hematology and Oncology

## 2015-02-09 VITALS — BP 148/54 | HR 84 | Temp 98.3°F | Resp 18

## 2015-02-09 VITALS — BP 163/56 | HR 90 | Temp 99.4°F | Resp 18 | Ht 64.0 in | Wt 166.5 lb

## 2015-02-09 DIAGNOSIS — Z5112 Encounter for antineoplastic immunotherapy: Secondary | ICD-10-CM | POA: Diagnosis not present

## 2015-02-09 DIAGNOSIS — N183 Chronic kidney disease, stage 3 unspecified: Secondary | ICD-10-CM

## 2015-02-09 DIAGNOSIS — D630 Anemia in neoplastic disease: Secondary | ICD-10-CM | POA: Diagnosis not present

## 2015-02-09 DIAGNOSIS — C8580 Other specified types of non-Hodgkin lymphoma, unspecified site: Secondary | ICD-10-CM

## 2015-02-09 DIAGNOSIS — E1121 Type 2 diabetes mellitus with diabetic nephropathy: Secondary | ICD-10-CM | POA: Diagnosis not present

## 2015-02-09 LAB — CBC WITH DIFFERENTIAL/PLATELET
BASO%: 0.4 % (ref 0.0–2.0)
Basophils Absolute: 0 10*3/uL (ref 0.0–0.1)
EOS ABS: 0.1 10*3/uL (ref 0.0–0.5)
EOS%: 1.2 % (ref 0.0–7.0)
HCT: 33.5 % — ABNORMAL LOW (ref 34.8–46.6)
HGB: 11.3 g/dL — ABNORMAL LOW (ref 11.6–15.9)
LYMPH%: 17.1 % (ref 14.0–49.7)
MCH: 28.8 pg (ref 25.1–34.0)
MCHC: 33.7 g/dL (ref 31.5–36.0)
MCV: 85.2 fL (ref 79.5–101.0)
MONO#: 0.4 10*3/uL (ref 0.1–0.9)
MONO%: 7 % (ref 0.0–14.0)
NEUT%: 74.3 % (ref 38.4–76.8)
NEUTROS ABS: 3.7 10*3/uL (ref 1.5–6.5)
Platelets: 180 10*3/uL (ref 145–400)
RBC: 3.93 10*6/uL (ref 3.70–5.45)
RDW: 12.9 % (ref 11.2–14.5)
WBC: 5 10*3/uL (ref 3.9–10.3)
lymph#: 0.9 10*3/uL (ref 0.9–3.3)

## 2015-02-09 LAB — COMPREHENSIVE METABOLIC PANEL
ALT: 15 U/L (ref 0–55)
AST: 14 U/L (ref 5–34)
Albumin: 3.5 g/dL (ref 3.5–5.0)
Alkaline Phosphatase: 94 U/L (ref 40–150)
Anion Gap: 10 mEq/L (ref 3–11)
BILIRUBIN TOTAL: 0.51 mg/dL (ref 0.20–1.20)
BUN: 23.6 mg/dL (ref 7.0–26.0)
CO2: 23 meq/L (ref 22–29)
CREATININE: 1.2 mg/dL — AB (ref 0.6–1.1)
Calcium: 9 mg/dL (ref 8.4–10.4)
Chloride: 106 mEq/L (ref 98–109)
EGFR: 43 mL/min/{1.73_m2} — ABNORMAL LOW (ref >90)
GLUCOSE: 357 mg/dL — AB (ref 70–140)
Potassium: 4.4 mEq/L (ref 3.5–5.1)
SODIUM: 139 meq/L (ref 136–145)
TOTAL PROTEIN: 6.7 g/dL (ref 6.4–8.3)

## 2015-02-09 LAB — LACTATE DEHYDROGENASE: LDH: 138 U/L (ref 125–245)

## 2015-02-09 MED ORDER — SODIUM CHLORIDE 0.9 % IV SOLN
Freq: Once | INTRAVENOUS | Status: AC
  Administered 2015-02-09: 10:00:00 via INTRAVENOUS

## 2015-02-09 MED ORDER — ACETAMINOPHEN 325 MG PO TABS
ORAL_TABLET | ORAL | Status: AC
  Filled 2015-02-09: qty 2

## 2015-02-09 MED ORDER — ACETAMINOPHEN 325 MG PO TABS
650.0000 mg | ORAL_TABLET | Freq: Once | ORAL | Status: AC
  Administered 2015-02-09: 650 mg via ORAL

## 2015-02-09 MED ORDER — DIPHENHYDRAMINE HCL 25 MG PO CAPS
50.0000 mg | ORAL_CAPSULE | Freq: Once | ORAL | Status: AC
  Administered 2015-02-09: 50 mg via ORAL

## 2015-02-09 MED ORDER — DIPHENHYDRAMINE HCL 25 MG PO CAPS
ORAL_CAPSULE | ORAL | Status: AC
  Filled 2015-02-09: qty 2

## 2015-02-09 MED ORDER — SODIUM CHLORIDE 0.9 % IV SOLN
375.0000 mg/m2 | Freq: Once | INTRAVENOUS | Status: AC
  Administered 2015-02-09: 700 mg via INTRAVENOUS
  Filled 2015-02-09: qty 70

## 2015-02-09 NOTE — Patient Instructions (Signed)
[facility] Discharge Instructions for Patients Receiving Chemotherapy  Today you received the following chemotherapy agents: Rituxan   To help prevent nausea and vomiting after your treatment, we encourage you to take your nausea medication as directed.    If you develop nausea and vomiting that is not controlled by your nausea medication, call the clinic.   BELOW ARE SYMPTOMS THAT SHOULD BE REPORTED IMMEDIATELY:  *FEVER GREATER THAN 100.5 F  *CHILLS WITH OR WITHOUT FEVER  NAUSEA AND VOMITING THAT IS NOT CONTROLLED WITH YOUR NAUSEA MEDICATION  *UNUSUAL SHORTNESS OF BREATH  *UNUSUAL BRUISING OR BLEEDING  TENDERNESS IN MOUTH AND THROAT WITH OR WITHOUT PRESENCE OF ULCERS  *URINARY PROBLEMS  *BOWEL PROBLEMS  UNUSUAL RASH Items with * indicate a potential emergency and should be followed up as soon as possible.  Feel free to call the clinic you have any questions or concerns. The clinic phone number is [phone].  Please show the CHEMO ALERT CARD at check-in to the Emergency Department and triage nurse.   

## 2015-02-09 NOTE — Assessment & Plan Note (Signed)
Her kidney function tests is stable. Continue close monitoring. There is no contraindication to continue treatment for now.  

## 2015-02-09 NOTE — Progress Notes (Signed)
[place] OFFICE PROGRESS NOTE  Patient Care Team: Aretta Nip, MD as PCP - General (Family Medicine) Sharyne Peach, MD as Consulting Physician (Ophthalmology) Inda Castle, MD as Consulting Physician (Gastroenterology) Theodis Sato, MD as Consulting Physician (Orthopedic Surgery) Jannette Spanner, MD as Referring Physician (Dermatology) Heath Lark, MD as Consulting Physician (Hematology and Oncology) Alphonsa Overall, MD as Consulting Physician (General Surgery)  SUMMARY OF ONCOLOGIC HISTORY: Oncology History   Marginal zone lymphoma, with lymph node and bone marrow involvement along with splenomegaly   Primary site: Lymphoid Neoplasms   Staging method: AJCC 6th Edition   Clinical: Stage IV signed by Heath Lark, MD on 09/02/2013  7:38 PM   Summary: Stage IV       Marginal zone lymphoma ([place])   07/29/2013 Imaging CT scan shows splenomegaly and abnormal lymphadenopathy.   08/13/2013 Imaging PET CT scan confirmed lymphadenopathy and splenomegaly, those areas are PET avid   08/23/2013 Surgery [id number] left axillary lymph node biopsy confirmed a low-grade lymphoma.   08/27/2013 Bone Marrow Biopsy Bone marrow biopsy confirmed lymphoma.[id number]   09/08/2013 - 09/29/2013 Chemotherapy She completed 4 cycles of rituximab with resolution of splenomegaly.   12/29/2013 Imaging PET scan show complete response.   07/11/2014 Imaging PET scan showed recurrent disease   07/19/2014 - 08/09/2014 Chemotherapy She completed 4 cycles of rituximab with resolution of splenomegaly   10/12/2014 Imaging Repeat PET/CT scan showed near complete response to treatment.   10/13/2014 -  Chemotherapy She is placed on rituximab maintenance therapy.    INTERVAL HISTORY: Please see below for problem oriented charting. She returns for further treatment. She denies recent infection. No new lymphadenopathy. Her blood sugar remained high on a regular basis. She is exercising with good energy. The patient  denies any recent signs or symptoms of bleeding such as spontaneous epistaxis, hematuria or hematochezia.   REVIEW OF SYSTEMS:   Constitutional: Denies fevers, chills or abnormal weight loss Eyes: Denies blurriness of vision Ears, nose, mouth, throat, and face: Denies mucositis or sore throat Respiratory: Denies cough, dyspnea or wheezes Cardiovascular: Denies palpitation, chest discomfort or lower extremity swelling Gastrointestinal:  Denies nausea, heartburn or change in bowel habits Skin: Denies abnormal skin rashes Lymphatics: Denies new lymphadenopathy or easy bruising Neurological:Denies numbness, tingling or new weaknesses Behavioral/Psych: Mood is stable, no new changes  All other systems were reviewed with the patient and are negative.  I have reviewed the past medical history, past surgical history, social history and family history with the patient and they are unchanged from previous note.  ALLERGIES:  is allergic to aspirin; capoten; levemir; micronase; onglyza; statins; and zocor.  MEDICATIONS:  Current Outpatient Prescriptions  Medication Sig Dispense Refill  . aspirin 81 MG tablet Take 40.5 mg by mouth 3 (three) times a week.    . Calcium Carbonate-Vit D-Min (CALCIUM 1200 PO) Take 1 tablet by mouth daily.    . cholecalciferol (VITAMIN D) 1000 UNITS tablet Take 1,000 Units by mouth every Monday, Wednesday, and Friday. Three times per week    . Cinnamon 500 MG capsule Take 500 mg by mouth daily.    . enalapril (VASOTEC) 20 MG tablet Take 20 mg by mouth every morning.    . fish oil-omega-3 fatty acids 1000 MG capsule Take 1 g by mouth daily.     Marland Kitchen glimepiride (AMARYL) 4 MG tablet Take 4 mg by mouth daily with breakfast.    . glucose blood test strip Use as instructed 100 each 12  . Magnesium  500 MG CAPS Take 500 mg by mouth daily.     . metFORMIN (GLUCOPHAGE) 1000 MG tablet Take 1,000 mg by mouth daily with breakfast.    . Misc Natural Products (LUTEIN 20 PO) Take by  mouth daily.    . Multiple Vitamins-Minerals (CENTRUM CARDIO) TABS Take 1 tablet by mouth daily.    . Multiple Vitamins-Minerals (ICAPS) CAPS Take by mouth daily.    . Red Yeast Rice 600 MG TABS Take 600 mg by mouth daily.      No current facility-administered medications for this visit.    PHYSICAL EXAMINATION: ECOG PERFORMANCE STATUS: 0 - Asymptomatic  Filed Vitals:   02/09/15 0913  BP: 163/56  Pulse: 90  Temp: 99.4 F (37.4 C)  Resp: 18   Filed Weights   02/09/15 0913  Weight: 166 lb 8 oz (75.524 kg)    GENERAL:alert, no distress and comfortable SKIN: skin color, texture, turgor are normal, no rashes or significant lesions EYES: normal, Conjunctiva are pink and non-injected, sclera clear OROPHARYNX:no exudate, no erythema and lips, buccal mucosa, and tongue normal  NECK: supple, thyroid normal size, non-tender, without nodularity LYMPH:  no palpable lymphadenopathy in the cervical, axillary or inguinal LUNGS: clear to auscultation and percussion with normal breathing effort HEART: regular rate & rhythm and no murmurs and no lower extremity edema ABDOMEN:abdomen soft, non-tender and normal bowel sounds Musculoskeletal:no cyanosis of digits and no clubbing  NEURO: alert & oriented x 3 with fluent speech, no focal motor/sensory deficits  LABORATORY DATA:  I have reviewed the data as listed    Component Value Date/Time   NA 139 02/09/2015 0901   NA 137 08/27/2013 0710   K 4.4 02/09/2015 0901   K 4.3 08/27/2013 0710   CL 100 08/27/2013 0710   CO2 23 02/09/2015 0901   CO2 22 08/27/2013 0710   GLUCOSE 357* 02/09/2015 0901   GLUCOSE 180* 08/27/2013 0710   BUN 23.6 02/09/2015 0901   BUN 16 08/27/2013 0710   CREATININE 1.2* 02/09/2015 0901   CREATININE 0.87 08/27/2013 0710   CREATININE 1.12* 05/18/2013 1256   CALCIUM 9.0 02/09/2015 0901   CALCIUM 9.4 08/27/2013 0710   PROT 6.7 02/09/2015 0901   PROT 7.3 08/27/2013 0710   ALBUMIN 3.5 02/09/2015 0901   ALBUMIN 2.9*  08/27/2013 0710   AST 14 02/09/2015 0901   AST 18 08/27/2013 0710   ALT 15 02/09/2015 0901   ALT 16 08/27/2013 0710   ALKPHOS 94 02/09/2015 0901   ALKPHOS 134* 08/27/2013 0710   BILITOT 0.51 02/09/2015 0901   BILITOT 0.4 08/27/2013 0710   GFRNONAA 62* 08/27/2013 0710   GFRNONAA 47* 05/18/2013 1256   GFRAA 72* 08/27/2013 0710   GFRAA 55* 05/18/2013 1256    No results found for: SPEP, UPEP  Lab Results  Component Value Date   WBC 5.0 02/09/2015   NEUTROABS 3.7 02/09/2015   HGB 11.3* 02/09/2015   HCT 33.5* 02/09/2015   MCV 85.2 02/09/2015   PLT 180 02/09/2015      Chemistry      Component Value Date/Time   NA 139 02/09/2015 0901   NA 137 08/27/2013 0710   K 4.4 02/09/2015 0901   K 4.3 08/27/2013 0710   CL 100 08/27/2013 0710   CO2 23 02/09/2015 0901   CO2 22 08/27/2013 0710   BUN 23.6 02/09/2015 0901   BUN 16 08/27/2013 0710   CREATININE 1.2* 02/09/2015 0901   CREATININE 0.87 08/27/2013 0710   CREATININE 1.12* 05/18/2013 1256  GLU 202* 08/09/2014 1000      Component Value Date/Time   CALCIUM 9.0 02/09/2015 0901   CALCIUM 9.4 08/27/2013 0710   ALKPHOS 94 02/09/2015 0901   ALKPHOS 134* 08/27/2013 0710   AST 14 02/09/2015 0901   AST 18 08/27/2013 0710   ALT 15 02/09/2015 0901   ALT 16 08/27/2013 0710   BILITOT 0.51 02/09/2015 0901   BILITOT 0.4 08/27/2013 0710     ASSESSMENT & PLAN:  Marginal zone lymphoma She tolerated treatment well with resolution of splenomegaly. Last PET CT scan showed near complete response to treatment. I plan to schedule maintenance therapy every 8 weeks for 2 years to prevent recurrence We will proceed with treatment today without dose adjustment.  Type 2 diabetes mellitus with diabetic nephropathy, without long-term current use of insulin (HCC) Her blood sugar is high She will continue her medications at home    Anemia in neoplastic disease This is likely anemia of chronic disease. The patient denies recent history of  bleeding such as epistaxis, hematuria or hematochezia. She is asymptomatic from the anemia. We will observe for now.      Chronic kidney disease (CKD), stage III (moderate) Her kidney function tests is stable. Continue close monitoring. There is no contraindication to continue treatment for now.    Orders Placed This Encounter  Procedures  . NM PET Image Restag (PS) Skull Base To Thigh    Standing Status: Future     Number of Occurrences:      Standing Expiration Date: 04/10/2016    Order Specific Question:  Reason for Exam (SYMPTOM  OR DIAGNOSIS REQUIRED)    Answer:  staging lymphoma assess response to Rx    Order Specific Question:  Preferred imaging location?    Answer:  [facility]  . CBC with Differential/Platelet    Standing Status: Future     Number of Occurrences:      Standing Expiration Date: 04/10/2016  . Comprehensive metabolic panel    Standing Status: Future     Number of Occurrences:      Standing Expiration Date: 04/10/2016  . Lactate dehydrogenase (LDH) - [facility]    Standing Status: Future     Number of Occurrences:      Standing Expiration Date: 04/10/2016   All questions were answered. The patient knows to call the clinic with any problems, questions or concerns. No barriers to learning was detected. I spent 20 minutes counseling the patient face to face. The total time spent in the appointment was 25 minutes and more than 50% was on counseling and review of test results     [facility], [place], MD 02/09/2015 12:57 PM

## 2015-02-09 NOTE — Telephone Encounter (Signed)
Gave patient avs report and appointments for March. [facility] will call re pet scan - patient aware.

## 2015-02-09 NOTE — Assessment & Plan Note (Signed)
She tolerated treatment well with resolution of splenomegaly. Last PET CT scan showed near complete response to treatment. I plan to schedule maintenance therapy every 8 weeks for 2 years to prevent recurrence We will proceed with treatment today without dose adjustment.

## 2015-02-09 NOTE — Assessment & Plan Note (Signed)
Her blood sugar is high She will continue her medications at home

## 2015-02-09 NOTE — Assessment & Plan Note (Signed)
This is likely anemia of chronic disease. The patient denies recent history of bleeding such as epistaxis, hematuria or hematochezia. She is asymptomatic from the anemia. We will observe for now.  

## 2015-03-22 ENCOUNTER — Other Ambulatory Visit (HOSPITAL_BASED_OUTPATIENT_CLINIC_OR_DEPARTMENT_OTHER): Payer: Self-pay | Admitting: Family Medicine

## 2015-03-22 DIAGNOSIS — Z780 Asymptomatic menopausal state: Secondary | ICD-10-CM

## 2015-03-22 DIAGNOSIS — Z299 Encounter for prophylactic measures, unspecified: Secondary | ICD-10-CM

## 2015-03-22 DIAGNOSIS — Z1231 Encounter for screening mammogram for malignant neoplasm of breast: Secondary | ICD-10-CM

## 2015-03-23 ENCOUNTER — Telehealth: Payer: Self-pay | Admitting: *Deleted

## 2015-03-23 ENCOUNTER — Other Ambulatory Visit: Payer: Self-pay | Admitting: Hematology and Oncology

## 2015-03-23 ENCOUNTER — Other Ambulatory Visit: Payer: Self-pay | Admitting: *Deleted

## 2015-03-23 NOTE — Telephone Encounter (Signed)
LVM for pt informing of PET scheduled for 3/1.   Need to arrive at [facility] Radiology dept at 7:30 am on Wed 3/1 and nothing to eat or drink after midnight.  Asked her to call us back to confirm.

## 2015-03-23 NOTE — Telephone Encounter (Signed)
Patient was told to call a couple weeks before if she did not have an appt for her PET scan on 3/14. She does not have an appt yet.

## 2015-03-24 ENCOUNTER — Telehealth: Payer: Self-pay | Admitting: *Deleted

## 2015-03-24 NOTE — Telephone Encounter (Signed)
Called patient. Confirmed PET on 3/1

## 2015-03-28 ENCOUNTER — Ambulatory Visit (HOSPITAL_BASED_OUTPATIENT_CLINIC_OR_DEPARTMENT_OTHER)
Admission: RE | Admit: 2015-03-28 | Discharge: 2015-03-28 | Disposition: A | Discharge status: Home / Self Care | Payer: Medicare Other | Source: Ambulatory Visit | Attending: Family Medicine | Admitting: Family Medicine

## 2015-03-28 DIAGNOSIS — Z780 Asymptomatic menopausal state: Secondary | ICD-10-CM

## 2015-03-28 DIAGNOSIS — M8580 Other specified disorders of bone density and structure, unspecified site: Secondary | ICD-10-CM | POA: Insufficient documentation

## 2015-03-28 DIAGNOSIS — Z1231 Encounter for screening mammogram for malignant neoplasm of breast: Secondary | ICD-10-CM | POA: Insufficient documentation

## 2015-03-28 DIAGNOSIS — Z299 Encounter for prophylactic measures, unspecified: Secondary | ICD-10-CM

## 2015-03-29 ENCOUNTER — Encounter (HOSPITAL_COMMUNITY)
Admission: RE | Admit: 2015-03-29 | Discharge: 2015-03-29 | Disposition: A | Discharge status: Home / Self Care | Payer: Medicare Other | Source: Ambulatory Visit | Attending: Hematology and Oncology | Admitting: Hematology and Oncology

## 2015-03-29 DIAGNOSIS — C8580 Other specified types of non-Hodgkin lymphoma, unspecified site: Secondary | ICD-10-CM | POA: Diagnosis present

## 2015-03-29 DIAGNOSIS — E1121 Type 2 diabetes mellitus with diabetic nephropathy: Secondary | ICD-10-CM

## 2015-03-29 LAB — GLUCOSE, CAPILLARY: GLUCOSE-CAPILLARY: 198 mg/dL — AB (ref 65–99)

## 2015-03-29 MED ORDER — FLUDEOXYGLUCOSE F - 18 (FDG) INJECTION
8.3000 | Freq: Once | INTRAVENOUS | Status: AC | PRN
  Administered 2015-03-29: 8.3 via INTRAVENOUS

## 2015-04-12 ENCOUNTER — Telehealth: Payer: Self-pay | Admitting: Pharmacist

## 2015-04-12 ENCOUNTER — Other Ambulatory Visit: Payer: Medicare Other

## 2015-04-12 ENCOUNTER — Ambulatory Visit (HOSPITAL_BASED_OUTPATIENT_CLINIC_OR_DEPARTMENT_OTHER): Payer: Medicare Other | Admitting: Hematology and Oncology

## 2015-04-12 ENCOUNTER — Telehealth: Payer: Self-pay | Admitting: Hematology and Oncology

## 2015-04-12 ENCOUNTER — Other Ambulatory Visit: Payer: Self-pay | Admitting: Hematology and Oncology

## 2015-04-12 ENCOUNTER — Ambulatory Visit: Payer: Medicare Other

## 2015-04-12 ENCOUNTER — Encounter: Payer: Self-pay | Admitting: Hematology and Oncology

## 2015-04-12 VITALS — BP 179/68 | HR 82 | Temp 98.2°F | Resp 18 | Wt 165.1 lb

## 2015-04-12 DIAGNOSIS — C8588 Other specified types of non-Hodgkin lymphoma, lymph nodes of multiple sites: Secondary | ICD-10-CM | POA: Diagnosis not present

## 2015-04-12 DIAGNOSIS — C8580 Other specified types of non-Hodgkin lymphoma, unspecified site: Secondary | ICD-10-CM

## 2015-04-12 MED ORDER — IBRUTINIB 140 MG PO CAPS: 560.0000 mg | ORAL_CAPSULE | Freq: Every day | ORAL | Status: DC

## 2015-04-12 NOTE — Assessment & Plan Note (Signed)
The decision was made based on publication at the [facility]. It is a category 1 recommendation from NCCN.  On 19 January, 2017, the [place] Food and Insurance risk surveyor (FDA) has approved ibrutinib (IMBRUVICA) for the treatment of patients with marginal zone lymphoma (MZL) who require systemic therapy and have received at least one prior anti-CD20-based therapy.   Accelerated approval was granted for this indication based on overall response rate (ORR).   The approval is based on results from the multicentre, open-label single arm, phase II PCYC-1121 trial assessing the safety and efficacy of ibrutinib in 63 patients with MZL who received at least one prior therapy, including all 3 subtypes: mucosa-associated lymphoid tissue (MALT; n=32), nodal MZL (NMZL; n=17), and splenic MZL (SMZL; n=14).  The responses were assessed by an Independent Review Committee using criteria adopted from the International Working Group criteria for malignant lymphoma and demonstrated a 46% ORR (95% CI: 33.4-59.1), with 3.2% of patients achieving complete responses (CR) and 42.9% achieving partial responses (PR). Efficacy was observed across all three MZL subtypes (ORR of 46.9%, 41.2%, and 50.0% for MALT, nodal, and splenic subtypes, respectively). The median duration of response (DOR) was not reached (range, 16.7-NR), with median follow-up of 19.4 months. The median time to initial response was 4.5 months (range, 2.3-16.4).   These data were presented at the [place] of Hemat[facility]) Meeting in December 2016.  Warnings and precautions for ibrutinib include hemorrhage, infections, cytopenias, atrial fibrillation, hypertension, second primary malignancies, tumour lysis syndrome and embryo-foetal toxicity. The most common (>20%) adverse events (AEs) of all grades included thrombocytopenia (49%), fatigue (44%), anaemia (43%), diarrhoea (43%), bruising (41%), musculoskeletal pain (40%), hemorrhage (30%), rash (29%),  nausea (25%), peripheral oedema and arthralgia (24% each), neutropenia (22%), cough (22%), and dyspnoea and upper respiratory tract infection (21% each). The most common (>10%) grade 3 or 4 AEs were decreases in haemoglobin and neutrophils (13% each) and pneumonia (10%).  This approval broadens the indication for ibrutinib to include relapsed/refractory marginal zone lymphoma.  The patient is aware that the response rates discussed earlier is not guaranteed.    After a long discussion, patient made an informed decision to proceed with the prescribed plan of care.  Once her medications are approved, she will return here on a weekly basis for blood draw and I will see her back within a month to assess toxicity.

## 2015-04-12 NOTE — Telephone Encounter (Signed)
Gave and printed appt sched and avs for pt for April °

## 2015-04-12 NOTE — Progress Notes (Signed)
[place] OFFICE PROGRESS NOTE  Patient Care Team: Aretta Nip, MD as PCP - General (Family Medicine) Sharyne Peach, MD as Consulting Physician (Ophthalmology) Inda Castle, MD as Consulting Physician (Gastroenterology) Theodis Sato, MD as Consulting Physician (Orthopedic Surgery) Jannette Spanner, MD as Referring Physician (Dermatology) Heath Lark, MD as Consulting Physician (Hematology and Oncology) Alphonsa Overall, MD as Consulting Physician (General Surgery)  SUMMARY OF ONCOLOGIC HISTORY: Oncology History   Marginal zone lymphoma, with lymph node and bone marrow involvement along with splenomegaly   Primary site: Lymphoid Neoplasms   Staging method: AJCC 6th Edition   Clinical: Stage IV signed by Heath Lark, MD on 09/02/2013  7:38 PM   Summary: Stage IV       Marginal zone lymphoma ([place])   07/29/2013 Imaging CT scan shows splenomegaly and abnormal lymphadenopathy.   08/13/2013 Imaging PET CT scan confirmed lymphadenopathy and splenomegaly, those areas are PET avid   08/23/2013 Surgery [id number] left axillary lymph node biopsy confirmed a low-grade lymphoma.   08/27/2013 Bone Marrow Biopsy Bone marrow biopsy confirmed lymphoma.[id number]   09/08/2013 - 09/29/2013 Chemotherapy She completed 4 cycles of rituximab with resolution of splenomegaly.   12/29/2013 Imaging PET scan show complete response.   07/11/2014 Imaging PET scan showed recurrent disease   07/19/2014 - 08/09/2014 Chemotherapy She completed 4 cycles of rituximab with resolution of splenomegaly   10/12/2014 Imaging Repeat PET/CT scan showed near complete response to treatment.   10/13/2014 - 02/09/2015 Chemotherapy She is placed on rituximab maintenance therapy.   03/29/2015 Imaging PET CT showed possible mild progression of splenomegaly    INTERVAL HISTORY: Please see below for problem oriented charting. She returns for further follow-up. She complained of early satiety and fullness in her abdomen. Denies  recent infection. The patient denies any recent signs or symptoms of bleeding such as spontaneous epistaxis, hematuria or hematochezia.   REVIEW OF SYSTEMS:   Constitutional: Denies fevers, chills or abnormal weight loss Eyes: Denies blurriness of vision Ears, nose, mouth, throat, and face: Denies mucositis or sore throat Respiratory: Denies cough, dyspnea or wheezes Cardiovascular: Denies palpitation, chest discomfort or lower extremity swelling Gastrointestinal:  Denies nausea, heartburn or change in bowel habits Skin: Denies abnormal skin rashes Lymphatics: Denies new lymphadenopathy Neurological:Denies numbness, tingling or new weaknesses Behavioral/Psych: Mood is stable, no new changes  All other systems were reviewed with the patient and are negative.  I have reviewed the past medical history, past surgical history, social history and family history with the patient and they are unchanged from previous note.  ALLERGIES:  is allergic to aspirin; capoten; levemir; micronase; onglyza; statins; and zocor.  MEDICATIONS:  Current Outpatient Prescriptions  Medication Sig Dispense Refill  . aspirin 81 MG tablet Take 40.5 mg by mouth 3 (three) times a week.    . Calcium Carbonate-Vit D-Min (CALCIUM 1200 PO) Take 1 tablet by mouth daily.    . cholecalciferol (VITAMIN D) 1000 UNITS tablet Take 1,000 Units by mouth every Monday, Wednesday, and Friday. Three times per week    . Cinnamon 500 MG capsule Take 500 mg by mouth daily.    . enalapril (VASOTEC) 20 MG tablet Take 20 mg by mouth every morning.    . fish oil-omega-3 fatty acids 1000 MG capsule Take 1 g by mouth daily.     Marland Kitchen glimepiride (AMARYL) 4 MG tablet Take 4 mg by mouth daily with breakfast.    . glucose blood test strip Use as instructed 100 each 12  . ibrutinib (  IMBRUVICA) 140 MG capsul Take 4 capsules (560 mg total) by mouth daily. 120 capsule 8  . Magnesium 500 MG CAPS Take 500 mg by mouth daily.     . metFORMIN (GLUCOPHAGE)  1000 MG tablet Take 1,000 mg by mouth daily with breakfast.    . Misc Natural Products (LUTEIN 20 PO) Take by mouth daily.    . Multiple Vitamins-Minerals (CENTRUM CARDIO) TABS Take 1 tablet by mouth daily.    . Multiple Vitamins-Minerals (ICAPS) CAPS Take by mouth daily.    . Red Yeast Rice 600 MG TABS Take 600 mg by mouth daily.      No current facility-administered medications for this visit.    PHYSICAL EXAMINATION: ECOG PERFORMANCE STATUS: 1 - Symptomatic but completely ambulatory  Filed Vitals:   04/12/15 1138  BP: 179/68  Pulse: 82  Temp: 98.2 F (36.8 C)  Resp: 18   Filed Weights   04/12/15 1138  Weight: 165 lb 1.6 oz (74.889 kg)    GENERAL:alert, no distress and comfortable SKIN: skin color, texture, turgor are normal, no rashes or significant lesions EYES: normal, Conjunctiva are pink and non-injected, sclera clear OROPHARYNX:no exudate, no erythema and lips, buccal mucosa, and tongue normal  NECK: supple, thyroid normal size, non-tender, without nodularity LYMPH:  no palpable lymphadenopathy in the cervical, axillary or inguinal LUNGS: clear to auscultation and percussion with normal breathing effort HEART: regular rate & rhythm and no murmurs and no lower extremity edema ABDOMEN:abdomen soft, non-tender and normal bowel sounds. Palpable splenomegaly Musculoskeletal:no cyanosis of digits and no clubbing  NEURO: alert & oriented x 3 with fluent speech, no focal motor/sensory deficits  LABORATORY DATA:  I have reviewed the data as listed    Component Value Date/Time   NA 139 02/09/2015 0901   NA 137 08/27/2013 0710   K 4.4 02/09/2015 0901   K 4.3 08/27/2013 0710   CL 100 08/27/2013 0710   CO2 23 02/09/2015 0901   CO2 22 08/27/2013 0710   GLUCOSE 357* 02/09/2015 0901   GLUCOSE 180* 08/27/2013 0710   BUN 23.6 02/09/2015 0901   BUN 16 08/27/2013 0710   CREATININE 1.2* 02/09/2015 0901   CREATININE 0.87 08/27/2013 0710   CREATININE 1.12* 05/18/2013 1256    CALCIUM 9.0 02/09/2015 0901   CALCIUM 9.4 08/27/2013 0710   PROT 6.7 02/09/2015 0901   PROT 7.3 08/27/2013 0710   ALBUMIN 3.5 02/09/2015 0901   ALBUMIN 2.9* 08/27/2013 0710   AST 14 02/09/2015 0901   AST 18 08/27/2013 0710   ALT 15 02/09/2015 0901   ALT 16 08/27/2013 0710   ALKPHOS 94 02/09/2015 0901   ALKPHOS 134* 08/27/2013 0710   BILITOT 0.51 02/09/2015 0901   BILITOT 0.4 08/27/2013 0710   GFRNONAA 62* 08/27/2013 0710   GFRNONAA 47* 05/18/2013 1256   GFRAA 72* 08/27/2013 0710   GFRAA 55* 05/18/2013 1256    No results found for: SPEP, UPEP  Lab Results  Component Value Date   WBC 5.0 02/09/2015   NEUTROABS 3.7 02/09/2015   HGB 11.3* 02/09/2015   HCT 33.5* 02/09/2015   MCV 85.2 02/09/2015   PLT 180 02/09/2015      Chemistry      Component Value Date/Time   NA 139 02/09/2015 0901   NA 137 08/27/2013 0710   K 4.4 02/09/2015 0901   K 4.3 08/27/2013 0710   CL 100 08/27/2013 0710   CO2 23 02/09/2015 0901   CO2 22 08/27/2013 0710   BUN 23.6 02/09/2015 0901  BUN 16 08/27/2013 0710   CREATININE 1.2* 02/09/2015 0901   CREATININE 0.87 08/27/2013 0710   CREATININE 1.12* 05/18/2013 1256   GLU 202* 08/09/2014 1000      Component Value Date/Time   CALCIUM 9.0 02/09/2015 0901   CALCIUM 9.4 08/27/2013 0710   ALKPHOS 94 02/09/2015 0901   ALKPHOS 134* 08/27/2013 0710   AST 14 02/09/2015 0901   AST 18 08/27/2013 0710   ALT 15 02/09/2015 0901   ALT 16 08/27/2013 0710   BILITOT 0.51 02/09/2015 0901   BILITOT 0.4 08/27/2013 0710       RADIOGRAPHIC STUDIES:I reviewed the PET/CT scan dated 03/29/2015 with the patient I have personally reviewed the radiological images as listed and agreed with the findings in the report.    ASSESSMENT & PLAN:  Marginal zone lymphoma The decision was made based on publication at the [facility]. It is a category 1 recommendation from NCCN.  On 19 January, 2017, the [place] Food and Insurance risk surveyor (FDA) has approved ibrutinib (IMBRUVICA) for  the treatment of patients with marginal zone lymphoma (MZL) who require systemic therapy and have received at least one prior anti-CD20-based therapy.   Accelerated approval was granted for this indication based on overall response rate (ORR).   The approval is based on results from the multicentre, open-label single arm, phase II PCYC-1121 trial assessing the safety and efficacy of ibrutinib in 63 patients with MZL who received at least one prior therapy, including all 3 subtypes: mucosa-associated lymphoid tissue (MALT; n=32), nodal MZL (NMZL; n=17), and splenic MZL (SMZL; n=14).  The responses were assessed by an Independent Review Committee using criteria adopted from the International Working Group criteria for malignant lymphoma and demonstrated a 46% ORR (95% CI: 33.4-59.1), with 3.2% of patients achieving complete responses (CR) and 42.9% achieving partial responses (PR). Efficacy was observed across all three MZL subtypes (ORR of 46.9%, 41.2%, and 50.0% for MALT, nodal, and splenic subtypes, respectively). The median duration of response (DOR) was not reached (range, 16.7-NR), with median follow-up of 19.4 months. The median time to initial response was 4.5 months (range, 2.3-16.4).   These data were presented at the [place] of Hematology [facility]) Meeting in December 2016.  Warnings and precautions for ibrutinib include hemorrhage, infections, cytopenias, atrial fibrillation, hypertension, second primary malignancies, tumour lysis syndrome and embryo-foetal toxicity. The most common (>20%) adverse events (AEs) of all grades included thrombocytopenia (49%), fatigue (44%), anaemia (43%), diarrhoea (43%), bruising (41%), musculoskeletal pain (40%), hemorrhage (30%), rash (29%), nausea (25%), peripheral oedema and arthralgia (24% each), neutropenia (22%), cough (22%), and dyspnoea and upper respiratory tract infection (21% each). The most common (>10%) grade 3 or 4 AEs were decreases  in haemoglobin and neutrophils (13% each) and pneumonia (10%).  This approval broadens the indication for ibrutinib to include relapsed/refractory marginal zone lymphoma.  The patient is aware that the response rates discussed earlier is not guaranteed.    After a long discussion, patient made an informed decision to proceed with the prescribed plan of care.  Once her medications are approved, she will return here on a weekly basis for blood draw and I will see her back within a month to assess toxicity.   No orders of the defined types were placed in this encounter.   All questions were answered. The patient knows to call the clinic with any problems, questions or concerns. No barriers to learning was detected. I spent 30 minutes counseling the patient face to face. The total time  spent in the appointment was 40 minutes and more than 50% was on counseling and review of test results     [facility], Ali Chuk, MD 04/12/2015 12:45 PM

## 2015-04-12 NOTE — Telephone Encounter (Signed)
04/12/15: Prior auth required for Imbruvica through [employer]. Submitted prior auth information through [email or web address]

## 2015-04-12 NOTE — Telephone Encounter (Signed)
04/12/15: New Rx for Imbruvica faxed to [place]

## 2015-04-12 NOTE — Telephone Encounter (Signed)
Gave and printed appt sched and avs for April

## 2015-04-13 NOTE — Telephone Encounter (Signed)
04/13/15: prior auth approved. Copay is high at > $3000 at [place]. Will attempt to find patient assistance through outside funding or manufacture.

## 2015-04-17 ENCOUNTER — Encounter: Payer: Self-pay | Admitting: Pharmacist

## 2015-04-17 NOTE — Progress Notes (Signed)
Pt came to [facility] lobby to sign application for [employer] Patient Assistance  Drug: Kate Sable Faxed application to [employer] this AM.  Pending approval. Kennith Center, Pharm.D., CPP 04/17/2015@11 :51 AM

## 2015-04-27 ENCOUNTER — Telehealth: Payer: Self-pay | Admitting: Pharmacist

## 2015-04-27 NOTE — Telephone Encounter (Signed)
I called and spoke with the patient. She received her Imbruvica yesterday and took her first dose. She states that she "feels fine" today. She immediately filled out the additional paperwork needed for enrollment and mailed it yesterday. She states that she knows how to take the medication and will call us if she has any question or concerns. I told her we would follow up with her in 1-2 weeks.

## 2015-04-27 NOTE — Telephone Encounter (Signed)
I called and spoke with Jamie Mendez and [place] patient assistance. They state that the patient's application was temporarily approved on 3/23 pending 1 additional Medicare form which they have mailed to the patient. She will receive a 1 month supply of Imbruvica while the application is pending. After she completes this form, she should be approved through 12/17. They referred me to [facility] who will be processing this patient's drug. I called them at [phone] and they confirmed that they had shipped the drug and the patient signed for delivery yesterday. I will call the patient today to ascertain that she has received and is taking Imbruvica as well as to check to see if she has received and mailed the paperwork that will be needed before they can process next month's prescription.

## 2015-04-28 ENCOUNTER — Telehealth: Payer: Self-pay | Admitting: *Deleted

## 2015-04-28 ENCOUNTER — Telehealth: Payer: Self-pay | Admitting: Hematology and Oncology

## 2015-04-28 ENCOUNTER — Other Ambulatory Visit: Payer: Self-pay | Admitting: Hematology and Oncology

## 2015-04-28 NOTE — Telephone Encounter (Signed)
TC to patient and advised her of Dr. Calton Dach recommendations.  Pt voiced understanding as to stopping Imbruvica and to push oral fluids. Informed pt that if she could not drink adequate fluids that we could give her IV fluids here. Pt states she feels that she can keep up with her oral fluids., but will call back if she cannot.

## 2015-04-28 NOTE — Telephone Encounter (Signed)
s.w. pt and advised on 4.5 appt....pt ok and aware °

## 2015-04-28 NOTE — Telephone Encounter (Signed)
Stop Ibrutinib If she cannot hydrate adequately we can bring her in for IVF Do not restart until I see her next week I will put new POF for labs and see me next week

## 2015-04-28 NOTE — Telephone Encounter (Signed)
VM message received from pt this am regarding episodes of diarrhea. TC back to back. She states she has had pretty 'Constant' diarrhea since midnight. States she is also having some nausea and 'stomach upset'. She is able to drink ginger ale fairly well. Instructed pt to begin Imodium and to take 2 tablets now and then follow directions on bottle-1 after each loose stool but no more than 6 tablets in 24 hours. Advised pt that I would inform Dr. Alvy Bimler and her nurse of situation.

## 2015-05-03 ENCOUNTER — Telehealth: Payer: Self-pay | Admitting: Hematology and Oncology

## 2015-05-03 ENCOUNTER — Encounter: Payer: Self-pay | Admitting: Hematology and Oncology

## 2015-05-03 ENCOUNTER — Other Ambulatory Visit (HOSPITAL_BASED_OUTPATIENT_CLINIC_OR_DEPARTMENT_OTHER): Payer: Medicare Other

## 2015-05-03 ENCOUNTER — Ambulatory Visit (HOSPITAL_BASED_OUTPATIENT_CLINIC_OR_DEPARTMENT_OTHER): Payer: Medicare Other | Admitting: Hematology and Oncology

## 2015-05-03 VITALS — BP 160/70 | HR 87 | Temp 97.8°F | Resp 18 | Ht 64.0 in | Wt 161.4 lb

## 2015-05-03 DIAGNOSIS — C8588 Other specified types of non-Hodgkin lymphoma, lymph nodes of multiple sites: Secondary | ICD-10-CM

## 2015-05-03 DIAGNOSIS — C8580 Other specified types of non-Hodgkin lymphoma, unspecified site: Secondary | ICD-10-CM

## 2015-05-03 DIAGNOSIS — D630 Anemia in neoplastic disease: Secondary | ICD-10-CM

## 2015-05-03 DIAGNOSIS — E1121 Type 2 diabetes mellitus with diabetic nephropathy: Secondary | ICD-10-CM

## 2015-05-03 DIAGNOSIS — K521 Toxic gastroenteritis and colitis: Secondary | ICD-10-CM | POA: Diagnosis not present

## 2015-05-03 LAB — COMPREHENSIVE METABOLIC PANEL
ALT: 15 U/L (ref 0–55)
AST: 14 U/L (ref 5–34)
Albumin: 3.5 g/dL (ref 3.5–5.0)
Alkaline Phosphatase: 103 U/L (ref 40–150)
Anion Gap: 9 mEq/L (ref 3–11)
BILIRUBIN TOTAL: 0.51 mg/dL (ref 0.20–1.20)
BUN: 20.4 mg/dL (ref 7.0–26.0)
CHLORIDE: 101 meq/L (ref 98–109)
CO2: 27 meq/L (ref 22–29)
Calcium: 9.8 mg/dL (ref 8.4–10.4)
Creatinine: 1.1 mg/dL (ref 0.6–1.1)
EGFR: 48 mL/min/{1.73_m2} — AB (ref >90)
GLUCOSE: 261 mg/dL — AB (ref 70–140)
POTASSIUM: 4.4 meq/L (ref 3.5–5.1)
SODIUM: 137 meq/L (ref 136–145)
Total Protein: 7 g/dL (ref 6.4–8.3)

## 2015-05-03 LAB — CBC WITH DIFFERENTIAL/PLATELET
BASO%: 0.4 % (ref 0.0–2.0)
BASOS ABS: 0 10*3/uL (ref 0.0–0.1)
EOS%: 0.7 % (ref 0.0–7.0)
Eosinophils Absolute: 0 10*3/uL (ref 0.0–0.5)
HCT: 31 % — ABNORMAL LOW (ref 34.8–46.6)
HEMOGLOBIN: 10.3 g/dL — AB (ref 11.6–15.9)
LYMPH%: 15.9 % (ref 14.0–49.7)
MCH: 27 pg (ref 25.1–34.0)
MCHC: 33.2 g/dL (ref 31.5–36.0)
MCV: 81.4 fL (ref 79.5–101.0)
MONO#: 0.3 10*3/uL (ref 0.1–0.9)
MONO%: 6.9 % (ref 0.0–14.0)
NEUT#: 3.4 10*3/uL (ref 1.5–6.5)
NEUT%: 76.1 % (ref 38.4–76.8)
Platelets: 145 10*3/uL (ref 145–400)
RBC: 3.81 10*6/uL (ref 3.70–5.45)
RDW: 13.7 % (ref 11.2–14.5)
WBC: 4.5 10*3/uL (ref 3.9–10.3)
lymph#: 0.7 10*3/uL — ABNORMAL LOW (ref 0.9–3.3)

## 2015-05-03 LAB — LACTATE DEHYDROGENASE: LDH: 133 U/L (ref 125–245)

## 2015-05-03 NOTE — Assessment & Plan Note (Signed)
She already took 2 days of treatment and developed severe diarrhea. Her treatment was placed on hold and since then her diarrhea is manageable. I recommend resumption of chemotherapy at reduced dose, 280 mg instead of 420 mg. I recommend blood work monitoring weekly. I will see her back in 3 weeks for further assessment

## 2015-05-03 NOTE — Telephone Encounter (Signed)
Gave and printed appt sched and avs for pt for April °

## 2015-05-03 NOTE — Progress Notes (Signed)
Obtained signature from patient for [employer] patient assistance. Faxed to [employer]. Fax received ok per confirmation sheet.

## 2015-05-03 NOTE — Assessment & Plan Note (Signed)
She had recent severe diarrhea likely due to chemotherapy. I recommend she avoid dairy products and to resume her treatment at reduced dose as outlined above. I recommend she continues to take Imodium as needed if her diarrhea recurs after resumption of treatment.

## 2015-05-03 NOTE — Assessment & Plan Note (Signed)
This is likely anemia of chronic disease. The patient denies recent history of bleeding such as epistaxis, hematuria or hematochezia. She is asymptomatic from the anemia. We will observe for now.  

## 2015-05-03 NOTE — Progress Notes (Signed)
[facility] OFFICE PROGRESS NOTE  Patient Care Team: Aretta Nip, MD as PCP - General (Family Medicine) Sharyne Peach, MD as Consulting Physician (Ophthalmology) Inda Castle, MD as Consulting Physician (Gastroenterology) Theodis Sato, MD as Consulting Physician (Orthopedic Surgery) Jannette Spanner, MD as Referring Physician (Dermatology) Heath Lark, MD as Consulting Physician (Hematology and Oncology) Alphonsa Overall, MD as Consulting Physician (General Surgery)  SUMMARY OF ONCOLOGIC HISTORY: Oncology History   Marginal zone lymphoma, with lymph node and bone marrow involvement along with splenomegaly   Primary site: Lymphoid Neoplasms   Staging method: AJCC 6th Edition   Clinical: Stage IV signed by Heath Lark, MD on 09/02/2013  7:38 PM   Summary: Stage IV       Marginal zone lymphoma ([place])   07/29/2013 Imaging CT scan shows splenomegaly and abnormal lymphadenopathy.   08/13/2013 Imaging PET CT scan confirmed lymphadenopathy and splenomegaly, those areas are PET avid   08/23/2013 Surgery [id number] left axillary lymph node biopsy confirmed a low-grade lymphoma.   08/27/2013 Bone Marrow Biopsy Bone marrow biopsy confirmed lymphoma.[id number]   09/08/2013 - 09/29/2013 Chemotherapy She completed 4 cycles of rituximab with resolution of splenomegaly.   12/29/2013 Imaging PET scan show complete response.   07/11/2014 Imaging PET scan showed recurrent disease   07/19/2014 - 08/09/2014 Chemotherapy She completed 4 cycles of rituximab with resolution of splenomegaly   10/12/2014 Imaging Repeat PET/CT scan showed near complete response to treatment.   10/13/2014 - 02/09/2015 Chemotherapy She is placed on rituximab maintenance therapy.   03/29/2015 Imaging PET CT showed possible mild progression of splenomegaly   04/26/2015 -  Chemotherapy She started taking Ibrutinib   04/28/2015 Adverse Reaction Treatment was placed on hold due to severe diarrhea    INTERVAL HISTORY: Please see  below for problem oriented charting. She is seen urgently today because of recent side effects of chemotherapy. She complained of fatigue. Her diarrhea has improved since last week. She has 2 loose bowel movement per day now without any associated abdominal cramping. Appetite is stable.  REVIEW OF SYSTEMS:   Constitutional: Denies fevers, chills or abnormal weight loss Eyes: Denies blurriness of vision Ears, nose, mouth, throat, and face: Denies mucositis or sore throat Respiratory: Denies cough, dyspnea or wheezes Cardiovascular: Denies palpitation, chest discomfort or lower extremity swelling Skin: Denies abnormal skin rashes Lymphatics: Denies new lymphadenopathy or easy bruising Neurological:Denies numbness, tingling or new weaknesses Behavioral/Psych: Mood is stable, no new changes  All other systems were reviewed with the patient and are negative.  I have reviewed the past medical history, past surgical history, social history and family history with the patient and they are unchanged from previous note.  ALLERGIES:  is allergic to aspirin; capoten; levemir; micronase; onglyza; statins; and zocor.  MEDICATIONS:  Current Outpatient Prescriptions  Medication Sig Dispense Refill  . aspirin 81 MG tablet Take 40.5 mg by mouth 3 (three) times a week.    . Calcium Carbonate-Vit D-Min (CALCIUM 1200 PO) Take 1 tablet by mouth daily.    . cholecalciferol (VITAMIN D) 1000 UNITS tablet Take 1,000 Units by mouth every Monday, Wednesday, and Friday. Three times per week    . Cinnamon 500 MG capsule Take 500 mg by mouth daily.    . enalapril (VASOTEC) 20 MG tablet Take 20 mg by mouth every morning.    . fish oil-omega-3 fatty acids 1000 MG capsule Take 1 g by mouth daily.     Marland Kitchen glimepiride (AMARYL) 4 MG tablet Take 4 mg by  mouth daily with breakfast.    . glucose blood test strip Use as instructed 100 each 12  . ibrutinib (IMBRUVICA) 140 MG capsul Take 4 capsules (560 mg total) by mouth daily.  120 capsule 8  . Magnesium 500 MG CAPS Take 500 mg by mouth daily.     . metFORMIN (GLUCOPHAGE) 1000 MG tablet Take 1,000 mg by mouth daily with breakfast.    . Misc Natural Products (LUTEIN 20 PO) Take by mouth daily.    . Multiple Vitamins-Minerals (CENTRUM CARDIO) TABS Take 1 tablet by mouth daily.    . Multiple Vitamins-Minerals (ICAPS) CAPS Take by mouth daily.    . Red Yeast Rice 600 MG TABS Take 600 mg by mouth daily.      No current facility-administered medications for this visit.    PHYSICAL EXAMINATION: ECOG PERFORMANCE STATUS: 1 - Symptomatic but completely ambulatory  Filed Vitals:   05/03/15 0931  BP: 160/70  Pulse: 87  Temp: 97.8 F (36.6 C)  Resp: 18   Filed Weights   05/03/15 0931  Weight: 161 lb 6.4 oz (73.211 kg)    GENERAL:alert, no distress and comfortable SKIN: skin color, texture, turgor are normal, no rashes or significant lesions EYES: normal, Conjunctiva are pink and non-injected, sclera clear ABDOMEN:abdomen soft, non-tender and normal bowel sounds Musculoskeletal:no cyanosis of digits and no clubbing  NEURO: alert & oriented x 3 with fluent speech, no focal motor/sensory deficits  LABORATORY DATA:  I have reviewed the data as listed    Component Value Date/Time   NA 137 05/03/2015 0907   NA 137 08/27/2013 0710   K 4.4 05/03/2015 0907   K 4.3 08/27/2013 0710   CL 100 08/27/2013 0710   CO2 27 05/03/2015 0907   CO2 22 08/27/2013 0710   GLUCOSE 261* 05/03/2015 0907   GLUCOSE 180* 08/27/2013 0710   BUN 20.4 05/03/2015 0907   BUN 16 08/27/2013 0710   CREATININE 1.1 05/03/2015 0907   CREATININE 0.87 08/27/2013 0710   CREATININE 1.12* 05/18/2013 1256   CALCIUM 9.8 05/03/2015 0907   CALCIUM 9.4 08/27/2013 0710   PROT 7.0 05/03/2015 0907   PROT 7.3 08/27/2013 0710   ALBUMIN 3.5 05/03/2015 0907   ALBUMIN 2.9* 08/27/2013 0710   AST 14 05/03/2015 0907   AST 18 08/27/2013 0710   ALT 15 05/03/2015 0907   ALT 16 08/27/2013 0710   ALKPHOS 103  05/03/2015 0907   ALKPHOS 134* 08/27/2013 0710   BILITOT 0.51 05/03/2015 0907   BILITOT 0.4 08/27/2013 0710   GFRNONAA 62* 08/27/2013 0710   GFRNONAA 47* 05/18/2013 1256   GFRAA 72* 08/27/2013 0710   GFRAA 55* 05/18/2013 1256    No results found for: SPEP, UPEP  Lab Results  Component Value Date   WBC 4.5 05/03/2015   NEUTROABS 3.4 05/03/2015   HGB 10.3* 05/03/2015   HCT 31.0* 05/03/2015   MCV 81.4 05/03/2015   PLT 145 05/03/2015      Chemistry      Component Value Date/Time   NA 137 05/03/2015 0907   NA 137 08/27/2013 0710   K 4.4 05/03/2015 0907   K 4.3 08/27/2013 0710   CL 100 08/27/2013 0710   CO2 27 05/03/2015 0907   CO2 22 08/27/2013 0710   BUN 20.4 05/03/2015 0907   BUN 16 08/27/2013 0710   CREATININE 1.1 05/03/2015 0907   CREATININE 0.87 08/27/2013 0710   CREATININE 1.12* 05/18/2013 1256   GLU 202* 08/09/2014 1000      Component  Value Date/Time   CALCIUM 9.8 05/03/2015 0907   CALCIUM 9.4 08/27/2013 0710   ALKPHOS 103 05/03/2015 0907   ALKPHOS 134* 08/27/2013 0710   AST 14 05/03/2015 0907   AST 18 08/27/2013 0710   ALT 15 05/03/2015 0907   ALT 16 08/27/2013 0710   BILITOT 0.51 05/03/2015 0907   BILITOT 0.4 08/27/2013 0710       ASSESSMENT & PLAN:  Marginal zone lymphoma She already took 2 days of treatment and developed severe diarrhea. Her treatment was placed on hold and since then her diarrhea is manageable. I recommend resumption of chemotherapy at reduced dose, 280 mg instead of 420 mg. I recommend blood work monitoring weekly. I will see her back in 3 weeks for further assessment  Anemia in neoplastic disease This is likely anemia of chronic disease. The patient denies recent history of bleeding such as epistaxis, hematuria or hematochezia. She is asymptomatic from the anemia. We will observe for now.   Diarrhea due to drug She had recent severe diarrhea likely due to chemotherapy. I recommend she avoid dairy products and to resume her  treatment at reduced dose as outlined above. I recommend she continues to take Imodium as needed if her diarrhea recurs after resumption of treatment.   No orders of the defined types were placed in this encounter.   All questions were answered. The patient knows to call the clinic with any problems, questions or concerns. No barriers to learning was detected. I spent 15 minutes counseling the patient face to face. The total time spent in the appointment was 20 minutes and more than 50% was on counseling and review of test results     [facility], [place], MD 05/03/2015 3:16 PM

## 2015-05-09 ENCOUNTER — Other Ambulatory Visit: Payer: Self-pay | Admitting: Hematology and Oncology

## 2015-05-09 DIAGNOSIS — C8580 Other specified types of non-Hodgkin lymphoma, unspecified site: Secondary | ICD-10-CM

## 2015-05-10 ENCOUNTER — Other Ambulatory Visit (HOSPITAL_BASED_OUTPATIENT_CLINIC_OR_DEPARTMENT_OTHER): Payer: Medicare Other

## 2015-05-10 ENCOUNTER — Telehealth: Payer: Self-pay | Admitting: *Deleted

## 2015-05-10 DIAGNOSIS — C8588 Other specified types of non-Hodgkin lymphoma, lymph nodes of multiple sites: Secondary | ICD-10-CM

## 2015-05-10 DIAGNOSIS — C8580 Other specified types of non-Hodgkin lymphoma, unspecified site: Secondary | ICD-10-CM

## 2015-05-10 LAB — CBC WITH DIFFERENTIAL/PLATELET
BASO%: 1.1 % (ref 0.0–2.0)
BASOS ABS: 0.1 10*3/uL (ref 0.0–0.1)
EOS ABS: 0.1 10*3/uL (ref 0.0–0.5)
EOS%: 1.1 % (ref 0.0–7.0)
HEMATOCRIT: 31.9 % — AB (ref 34.8–46.6)
HGB: 10.4 g/dL — ABNORMAL LOW (ref 11.6–15.9)
LYMPH#: 1 10*3/uL (ref 0.9–3.3)
LYMPH%: 21.4 % (ref 14.0–49.7)
MCH: 26.4 pg (ref 25.1–34.0)
MCHC: 32.6 g/dL (ref 31.5–36.0)
MCV: 81.1 fL (ref 79.5–101.0)
MONO#: 0.3 10*3/uL (ref 0.1–0.9)
MONO%: 5.8 % (ref 0.0–14.0)
NEUT#: 3.3 10*3/uL (ref 1.5–6.5)
NEUT%: 70.6 % (ref 38.4–76.8)
Platelets: 175 10*3/uL (ref 145–400)
RBC: 3.93 10*6/uL (ref 3.70–5.45)
RDW: 14.6 % — ABNORMAL HIGH (ref 11.2–14.5)
WBC: 4.6 10*3/uL (ref 3.9–10.3)

## 2015-05-10 LAB — COMPREHENSIVE METABOLIC PANEL
ALBUMIN: 3.5 g/dL (ref 3.5–5.0)
ALT: 13 U/L (ref 0–55)
ANION GAP: 8 meq/L (ref 3–11)
AST: 15 U/L (ref 5–34)
Alkaline Phosphatase: 81 U/L (ref 40–150)
BILIRUBIN TOTAL: 0.38 mg/dL (ref 0.20–1.20)
BUN: 12.2 mg/dL (ref 7.0–26.0)
CALCIUM: 9.1 mg/dL (ref 8.4–10.4)
CHLORIDE: 103 meq/L (ref 98–109)
CO2: 28 mEq/L (ref 22–29)
CREATININE: 1 mg/dL (ref 0.6–1.1)
EGFR: 51 mL/min/{1.73_m2} — AB (ref >90)
Glucose: 211 mg/dl — ABNORMAL HIGH (ref 70–140)
POTASSIUM: 3.8 meq/L (ref 3.5–5.1)
Sodium: 139 mEq/L (ref 136–145)
Total Protein: 6.7 g/dL (ref 6.4–8.3)

## 2015-05-10 NOTE — Telephone Encounter (Signed)
Pt notified of message below. Verbalized understanding 

## 2015-05-10 NOTE — Telephone Encounter (Signed)
-----   Message from Heath Lark, MD sent at 05/10/2015 11:25 AM EDT ----- Regarding: labs ok pls let her know labs are ok Continue Irbutinib ----- Message -----    From: [facility] Interface    Sent: 05/10/2015  10:32 AM      To: Heath Lark, MD

## 2015-05-12 ENCOUNTER — Telehealth: Payer: Self-pay | Admitting: Pharmacist

## 2015-05-12 NOTE — Telephone Encounter (Signed)
05/12/15: Attempted to reach patient for follow up on oral medication: New Start Imbruvica. No answer. Left VM for patient to call back with any questions or issues.   Thank you,  Skip Mayer, PharmD, [facility] [phone]

## 2015-05-17 ENCOUNTER — Telehealth: Payer: Self-pay | Admitting: *Deleted

## 2015-05-17 ENCOUNTER — Other Ambulatory Visit (HOSPITAL_BASED_OUTPATIENT_CLINIC_OR_DEPARTMENT_OTHER): Payer: Medicare Other

## 2015-05-17 DIAGNOSIS — C8580 Other specified types of non-Hodgkin lymphoma, unspecified site: Secondary | ICD-10-CM

## 2015-05-17 DIAGNOSIS — C8588 Other specified types of non-Hodgkin lymphoma, lymph nodes of multiple sites: Secondary | ICD-10-CM | POA: Diagnosis not present

## 2015-05-17 LAB — CBC WITH DIFFERENTIAL/PLATELET
BASO%: 0.9 % (ref 0.0–2.0)
Basophils Absolute: 0 10*3/uL (ref 0.0–0.1)
EOS%: 1.6 % (ref 0.0–7.0)
Eosinophils Absolute: 0.1 10*3/uL (ref 0.0–0.5)
HCT: 33.4 % — ABNORMAL LOW (ref 34.8–46.6)
HGB: 10.9 g/dL — ABNORMAL LOW (ref 11.6–15.9)
LYMPH%: 19.8 % (ref 14.0–49.7)
MCH: 26.5 pg (ref 25.1–34.0)
MCHC: 32.5 g/dL (ref 31.5–36.0)
MCV: 81.6 fL (ref 79.5–101.0)
MONO#: 0.3 10*3/uL (ref 0.1–0.9)
MONO%: 6.4 % (ref 0.0–14.0)
NEUT#: 3.6 10*3/uL (ref 1.5–6.5)
NEUT%: 71.3 % (ref 38.4–76.8)
PLATELETS: 167 10*3/uL (ref 145–400)
RBC: 4.09 10*6/uL (ref 3.70–5.45)
RDW: 15.4 % — ABNORMAL HIGH (ref 11.2–14.5)
WBC: 5 10*3/uL (ref 3.9–10.3)
lymph#: 1 10*3/uL (ref 0.9–3.3)

## 2015-05-17 LAB — COMPREHENSIVE METABOLIC PANEL
ALT: 10 U/L (ref 0–55)
ANION GAP: 9 meq/L (ref 3–11)
AST: 12 U/L (ref 5–34)
Albumin: 3.4 g/dL — ABNORMAL LOW (ref 3.5–5.0)
Alkaline Phosphatase: 75 U/L (ref 40–150)
BUN: 17.2 mg/dL (ref 7.0–26.0)
CHLORIDE: 101 meq/L (ref 98–109)
CO2: 25 meq/L (ref 22–29)
CREATININE: 1.2 mg/dL — AB (ref 0.6–1.1)
Calcium: 9.4 mg/dL (ref 8.4–10.4)
EGFR: 44 mL/min/{1.73_m2} — AB (ref >90)
Glucose: 306 mg/dl — ABNORMAL HIGH (ref 70–140)
Potassium: 4.2 mEq/L (ref 3.5–5.1)
Sodium: 135 mEq/L — ABNORMAL LOW (ref 136–145)
Total Bilirubin: 0.46 mg/dL (ref 0.20–1.20)
Total Protein: 6.7 g/dL (ref 6.4–8.3)

## 2015-05-17 NOTE — Telephone Encounter (Signed)
Informed pt of Lab results and Dr. Calton Dach message below.  She states her fasting blood sugar this morning was 103 and she did not fast for lab.  She will continue to monitor at home and she has appt w/ PCP on 5/2.

## 2015-05-17 NOTE — Telephone Encounter (Signed)
-----   Message from Heath Lark, MD sent at 05/17/2015 12:35 PM EDT ----- Regarding: labs Pls let her know labs are stable but her blood sugar is very high Recommend follow-up with PCP for management

## 2015-05-25 ENCOUNTER — Encounter: Payer: Self-pay | Admitting: Hematology and Oncology

## 2015-05-25 ENCOUNTER — Telehealth: Payer: Self-pay | Admitting: Hematology and Oncology

## 2015-05-25 ENCOUNTER — Ambulatory Visit (HOSPITAL_BASED_OUTPATIENT_CLINIC_OR_DEPARTMENT_OTHER): Payer: Medicare Other | Admitting: Hematology and Oncology

## 2015-05-25 ENCOUNTER — Other Ambulatory Visit (HOSPITAL_BASED_OUTPATIENT_CLINIC_OR_DEPARTMENT_OTHER): Payer: Medicare Other

## 2015-05-25 VITALS — BP 172/63 | HR 81 | Temp 97.9°F | Resp 18 | Ht 64.0 in | Wt 159.4 lb

## 2015-05-25 DIAGNOSIS — C8580 Other specified types of non-Hodgkin lymphoma, unspecified site: Secondary | ICD-10-CM

## 2015-05-25 DIAGNOSIS — K521 Toxic gastroenteritis and colitis: Secondary | ICD-10-CM | POA: Diagnosis not present

## 2015-05-25 DIAGNOSIS — C8588 Other specified types of non-Hodgkin lymphoma, lymph nodes of multiple sites: Secondary | ICD-10-CM

## 2015-05-25 DIAGNOSIS — I10 Essential (primary) hypertension: Secondary | ICD-10-CM

## 2015-05-25 LAB — CBC WITH DIFFERENTIAL/PLATELET
BASO%: 0.5 % (ref 0.0–2.0)
BASOS ABS: 0 10*3/uL (ref 0.0–0.1)
EOS ABS: 0.1 10*3/uL (ref 0.0–0.5)
EOS%: 1.4 % (ref 0.0–7.0)
HCT: 35.9 % (ref 34.8–46.6)
HGB: 11.7 g/dL (ref 11.6–15.9)
LYMPH%: 21.6 % (ref 14.0–49.7)
MCH: 26.5 pg (ref 25.1–34.0)
MCHC: 32.6 g/dL (ref 31.5–36.0)
MCV: 81.4 fL (ref 79.5–101.0)
MONO#: 0.3 10*3/uL (ref 0.1–0.9)
MONO%: 6 % (ref 0.0–14.0)
NEUT#: 4 10*3/uL (ref 1.5–6.5)
NEUT%: 70.5 % (ref 38.4–76.8)
PLATELETS: 159 10*3/uL (ref 145–400)
RBC: 4.41 10*6/uL (ref 3.70–5.45)
RDW: 15 % — ABNORMAL HIGH (ref 11.2–14.5)
WBC: 5.7 10*3/uL (ref 3.9–10.3)
lymph#: 1.2 10*3/uL (ref 0.9–3.3)

## 2015-05-25 LAB — COMPREHENSIVE METABOLIC PANEL
ALT: 14 U/L (ref 0–55)
ANION GAP: 8 meq/L (ref 3–11)
AST: 15 U/L (ref 5–34)
Albumin: 3.9 g/dL (ref 3.5–5.0)
Alkaline Phosphatase: 76 U/L (ref 40–150)
BUN: 18.8 mg/dL (ref 7.0–26.0)
CALCIUM: 10.3 mg/dL (ref 8.4–10.4)
CHLORIDE: 103 meq/L (ref 98–109)
CO2: 28 meq/L (ref 22–29)
Creatinine: 1.1 mg/dL (ref 0.6–1.1)
EGFR: 49 mL/min/{1.73_m2} — AB (ref >90)
Glucose: 150 mg/dl — ABNORMAL HIGH (ref 70–140)
POTASSIUM: 4.5 meq/L (ref 3.5–5.1)
Sodium: 138 mEq/L (ref 136–145)
Total Bilirubin: 0.51 mg/dL (ref 0.20–1.20)
Total Protein: 7.2 g/dL (ref 6.4–8.3)

## 2015-05-25 NOTE — Assessment & Plan Note (Signed)
She had recent severe diarrhea likely due to chemotherapy. I recommend she avoid dairy products and to resume her treatment at reduced dose as outlined above. I recommend she continues to take Imodium as needed if her diarrhea recurs after resumption of treatment.

## 2015-05-25 NOTE — Assessment & Plan Note (Signed)
she will continue current medical management.  according to her, she has significant element of white coat hypertension. Her BP monitoring at home show blood pressure is within normal limits I recommend close follow-up with primary care doctor for medication adjustment.

## 2015-05-25 NOTE — Assessment & Plan Note (Signed)
She tolerated treatment well with reduced dose Imbruvica. We will continue the same dose 280 mg daily. With resolution of the anemia, I think she is achieving good response to treatment. Plan to defer imaging study until after I see her next time

## 2015-05-25 NOTE — Progress Notes (Signed)
[facility] OFFICE PROGRESS NOTE  Patient Care Team: Aretta Nip, MD as PCP - General (Family Medicine) Sharyne Peach, MD as Consulting Physician (Ophthalmology) Inda Castle, MD as Consulting Physician (Gastroenterology) Theodis Sato, MD as Consulting Physician (Orthopedic Surgery) Jannette Spanner, MD as Referring Physician (Dermatology) Heath Lark, MD as Consulting Physician (Hematology and Oncology) Alphonsa Overall, MD as Consulting Physician (General Surgery)  SUMMARY OF ONCOLOGIC HISTORY: Oncology History   Marginal zone lymphoma, with lymph node and bone marrow involvement along with splenomegaly   Primary site: Lymphoid Neoplasms   Staging method: AJCC 6th Edition   Clinical: Stage IV signed by Heath Lark, MD on 09/02/2013  7:38 PM   Summary: Stage IV       Marginal zone lymphoma ([place])   07/29/2013 Imaging CT scan shows splenomegaly and abnormal lymphadenopathy.   08/13/2013 Imaging PET CT scan confirmed lymphadenopathy and splenomegaly, those areas are PET avid   08/23/2013 Surgery [id number] left axillary lymph node biopsy confirmed a low-grade lymphoma.   08/27/2013 Bone Marrow Biopsy Bone marrow biopsy confirmed lymphoma.[id number]   09/08/2013 - 09/29/2013 Chemotherapy She completed 4 cycles of rituximab with resolution of splenomegaly.   12/29/2013 Imaging PET scan show complete response.   07/11/2014 Imaging PET scan showed recurrent disease   07/19/2014 - 08/09/2014 Chemotherapy She completed 4 cycles of rituximab with resolution of splenomegaly   10/12/2014 Imaging Repeat PET/CT scan showed near complete response to treatment.   10/13/2014 - 02/09/2015 Chemotherapy She is placed on rituximab maintenance therapy.   03/29/2015 Imaging PET CT showed possible mild progression of splenomegaly   04/26/2015 -  Chemotherapy She started taking Ibrutinib   04/28/2015 Adverse Reaction Treatment was placed temporarily on hold due to severe diarrhea and resumed at reduced dose     INTERVAL HISTORY: Please see below for problem oriented charting. She returns for further follow-up. She tolerated reduced dose chemotherapy better and only had mild intermittent diarrhea and she rarely uses Imodium. She had mild bruising. Denies recent infection she has excellent energy level and side of exercise and again.  REVIEW OF SYSTEMS:   Constitutional: Denies fevers, chills or abnormal weight loss Eyes: Denies blurriness of vision Ears, nose, mouth, throat, and face: Denies mucositis or sore throat Respiratory: Denies cough, dyspnea or wheezes Cardiovascular: Denies palpitation, chest discomfort or lower extremity swelling Lymphatics: Denies new lymphadenopathy  Neurological:Denies numbness, tingling or new weaknesses Behavioral/Psych: Mood is stable, no new changes  All other systems were reviewed with the patient and are negative.  I have reviewed the past medical history, past surgical history, social history and family history with the patient and they are unchanged from previous note.  ALLERGIES:  is allergic to aspirin; capoten; levemir; micronase; onglyza; statins; and zocor.  MEDICATIONS:  Current Outpatient Prescriptions  Medication Sig Dispense Refill  . aspirin 81 MG tablet Take 40.5 mg by mouth 3 (three) times a week.    . Calcium Carbonate-Vit D-Min (CALCIUM 1200 PO) Take 1 tablet by mouth daily.    . cholecalciferol (VITAMIN D) 1000 UNITS tablet Take 1,000 Units by mouth every Monday, Wednesday, and Friday. Three times per week    . Cinnamon 500 MG capsule Take 500 mg by mouth daily.    . enalapril (VASOTEC) 20 MG tablet Take 20 mg by mouth every morning.    . fish oil-omega-3 fatty acids 1000 MG capsule Take 1 g by mouth daily.     Marland Kitchen glimepiride (AMARYL) 4 MG tablet Take 4 mg by mouth  daily with breakfast.    . glucose blood test strip Use as instructed 100 each 12  . ibrutinib (IMBRUVICA) 140 MG capsul Take 4 capsules (560 mg total) by mouth daily. 120  capsule 8  . Magnesium 500 MG CAPS Take 500 mg by mouth daily.     . metFORMIN (GLUCOPHAGE) 1000 MG tablet Take 1,000 mg by mouth daily with breakfast.    . Misc Natural Products (LUTEIN 20 PO) Take by mouth daily.    . Multiple Vitamins-Minerals (CENTRUM CARDIO) TABS Take 1 tablet by mouth daily.    . Multiple Vitamins-Minerals (ICAPS) CAPS Take by mouth daily.    . Red Yeast Rice 600 MG TABS Take 600 mg by mouth daily.      No current facility-administered medications for this visit.    PHYSICAL EXAMINATION: ECOG PERFORMANCE STATUS: 0 - Asymptomatic  Filed Vitals:   05/25/15 1043  BP: 172/63  Pulse: 81  Temp: 97.9 F (36.6 C)  Resp: 18   Filed Weights   05/25/15 1043  Weight: 159 lb 6.4 oz (72.303 kg)    GENERAL:alert, no distress and comfortable SKIN: skin color, texture, turgor are normal, no rashes or significant lesions. Noted minor skin bruising EYES: normal, Conjunctiva are pink and non-injected, sclera clear HEART: regular rate & rhythm and no murmurs and no lower extremity edema Musculoskeletal:no cyanosis of digits and no clubbing  NEURO: alert & oriented x 3 with fluent speech, no focal motor/sensory deficits  LABORATORY DATA:  I have reviewed the data as listed    Component Value Date/Time   NA 135* 05/17/2015 1056   NA 137 08/27/2013 0710   K 4.2 05/17/2015 1056   K 4.3 08/27/2013 0710   CL 100 08/27/2013 0710   CO2 25 05/17/2015 1056   CO2 22 08/27/2013 0710   GLUCOSE 306* 05/17/2015 1056   GLUCOSE 180* 08/27/2013 0710   BUN 17.2 05/17/2015 1056   BUN 16 08/27/2013 0710   CREATININE 1.2* 05/17/2015 1056   CREATININE 0.87 08/27/2013 0710   CREATININE 1.12* 05/18/2013 1256   CALCIUM 9.4 05/17/2015 1056   CALCIUM 9.4 08/27/2013 0710   PROT 6.7 05/17/2015 1056   PROT 7.3 08/27/2013 0710   ALBUMIN 3.4* 05/17/2015 1056   ALBUMIN 2.9* 08/27/2013 0710   AST 12 05/17/2015 1056   AST 18 08/27/2013 0710   ALT 10 05/17/2015 1056   ALT 16 08/27/2013 0710    ALKPHOS 75 05/17/2015 1056   ALKPHOS 134* 08/27/2013 0710   BILITOT 0.46 05/17/2015 1056   BILITOT 0.4 08/27/2013 0710   GFRNONAA 62* 08/27/2013 0710   GFRNONAA 47* 05/18/2013 1256   GFRAA 72* 08/27/2013 0710   GFRAA 55* 05/18/2013 1256    No results found for: SPEP, UPEP  Lab Results  Component Value Date   WBC 5.7 05/25/2015   NEUTROABS 4.0 05/25/2015   HGB 11.7 05/25/2015   HCT 35.9 05/25/2015   MCV 81.4 05/25/2015   PLT 159 05/25/2015      Chemistry      Component Value Date/Time   NA 135* 05/17/2015 1056   NA 137 08/27/2013 0710   K 4.2 05/17/2015 1056   K 4.3 08/27/2013 0710   CL 100 08/27/2013 0710   CO2 25 05/17/2015 1056   CO2 22 08/27/2013 0710   BUN 17.2 05/17/2015 1056   BUN 16 08/27/2013 0710   CREATININE 1.2* 05/17/2015 1056   CREATININE 0.87 08/27/2013 0710   CREATININE 1.12* 05/18/2013 1256   GLU 202* 08/09/2014 1000  Component Value Date/Time   CALCIUM 9.4 05/17/2015 1056   CALCIUM 9.4 08/27/2013 0710   ALKPHOS 75 05/17/2015 1056   ALKPHOS 134* 08/27/2013 0710   AST 12 05/17/2015 1056   AST 18 08/27/2013 0710   ALT 10 05/17/2015 1056   ALT 16 08/27/2013 0710   BILITOT 0.46 05/17/2015 1056   BILITOT 0.4 08/27/2013 0710      ASSESSMENT & PLAN:  Marginal zone lymphoma She tolerated treatment well with reduced dose Imbruvica. We will continue the same dose 280 mg daily. With resolution of the anemia, I think she is achieving good response to treatment. Plan to defer imaging study until after I see her next time  Essential hypertension she will continue current medical management.  according to her, she has significant element of white coat hypertension. Her BP monitoring at home show blood pressure is within normal limits I recommend close follow-up with primary care doctor for medication adjustment.  Diarrhea due to drug She had recent severe diarrhea likely due to chemotherapy. I recommend she avoid dairy products and to resume  her treatment at reduced dose as outlined above. I recommend she continues to take Imodium as needed if her diarrhea recurs after resumption of treatment.    No orders of the defined types were placed in this encounter.   All questions were answered. The patient knows to call the clinic with any problems, questions or concerns. No barriers to learning was detected. I spent 15 minutes counseling the patient face to face. The total time spent in the appointment was 20 minutes and more than 50% was on counseling and review of test results     [facility], [place], MD 05/25/2015 11:00 AM

## 2015-05-25 NOTE — Telephone Encounter (Signed)
Gave and printed appt sched and avs for pt for June °

## 2015-05-26 ENCOUNTER — Encounter: Payer: Self-pay | Admitting: Pharmacist

## 2015-05-26 NOTE — Progress Notes (Signed)
Oral Chemotherapy Follow-Up Form  Original Start date of oral chemotherapy: _3/29/17_   Called patient today to follow up regarding patient's oral chemotherapy medication: _Imbruvica___  Pt is doing well today. She is doing much better since reducing dose to 280 mg daily. Still with some minor diarrhea but this is manageable. No issues with fatigue, blood counts stable at Dr. Alvy Bimler office visit yesterday on 4/27.   Pt reports _0___ tablets/doses missed in the last month.    Pt reports the following side effects: _minor diarrhea____   Will follow up and call patient again in _3 weeks___   Thank you,  Montel Clock, PharmD, [facility]

## 2015-06-13 ENCOUNTER — Telehealth: Payer: Self-pay | Admitting: Pharmacist

## 2015-06-13 NOTE — Telephone Encounter (Signed)
Oral Chemotherapy Follow-Up Form  Original Start date of oral chemotherapy: 04/26/15  Called patient today to follow up regarding patient's oral chemotherapy medication: Imbruvica  Pt reports, correctly, taking 280 mg (2 x 140 mg capsules) daily.  She takes them whole w/ a glass of water.  Pt reports no significant side effects.  Her diarrhea is much improved w/ dose decrease and taking 1 Imodium cap/day.  Other Issues: Energy decrease- she has a lot of energy in the morning but by noon she is "drained."  She also feels tired enough to go to bed at 8pm nightly.  She will need a refill of her Imbruvica soon and is going to call the specialty pharmacy on Friday (5/19)  Will follow up and call patient again in 1 month  Thank you,  Kennith Center, Pharm.D., CPP 06/13/2015@3 :50 PM [facility]

## 2015-06-16 ENCOUNTER — Telehealth: Payer: Self-pay | Admitting: *Deleted

## 2015-06-16 ENCOUNTER — Other Ambulatory Visit: Payer: Self-pay | Admitting: Hematology and Oncology

## 2015-06-16 DIAGNOSIS — C8580 Other specified types of non-Hodgkin lymphoma, unspecified site: Secondary | ICD-10-CM

## 2015-06-16 MED ORDER — IBRUTINIB 140 MG PO CAPS: 280.0000 mg | ORAL_CAPSULE | Freq: Every day | ORAL | Status: DC

## 2015-06-16 NOTE — Telephone Encounter (Signed)
Fax for [facility] is [phone].

## 2015-06-16 NOTE — Telephone Encounter (Signed)
Imbruvica faxed to [facility]. Fax [phone]

## 2015-06-16 NOTE — Telephone Encounter (Signed)
Patient called stating that she needs a new prescription for Imbruvica sent to [facility]. Her doseage was changed and they need a new prescription showing this.

## 2015-06-29 ENCOUNTER — Ambulatory Visit (HOSPITAL_BASED_OUTPATIENT_CLINIC_OR_DEPARTMENT_OTHER): Payer: Medicare Other | Admitting: Hematology and Oncology

## 2015-06-29 ENCOUNTER — Telehealth: Payer: Self-pay | Admitting: Hematology and Oncology

## 2015-06-29 ENCOUNTER — Other Ambulatory Visit (HOSPITAL_BASED_OUTPATIENT_CLINIC_OR_DEPARTMENT_OTHER): Payer: Medicare Other

## 2015-06-29 ENCOUNTER — Encounter: Payer: Self-pay | Admitting: Hematology and Oncology

## 2015-06-29 VITALS — BP 174/68 | HR 78 | Temp 98.3°F | Resp 18 | Ht 64.0 in | Wt 163.5 lb

## 2015-06-29 DIAGNOSIS — D630 Anemia in neoplastic disease: Secondary | ICD-10-CM

## 2015-06-29 DIAGNOSIS — C8588 Other specified types of non-Hodgkin lymphoma, lymph nodes of multiple sites: Secondary | ICD-10-CM | POA: Diagnosis not present

## 2015-06-29 DIAGNOSIS — C8580 Other specified types of non-Hodgkin lymphoma, unspecified site: Secondary | ICD-10-CM | POA: Diagnosis not present

## 2015-06-29 DIAGNOSIS — I10 Essential (primary) hypertension: Secondary | ICD-10-CM | POA: Diagnosis not present

## 2015-06-29 DIAGNOSIS — C8307 Small cell B-cell lymphoma, spleen: Secondary | ICD-10-CM

## 2015-06-29 LAB — COMPREHENSIVE METABOLIC PANEL
ALBUMIN: 3.5 g/dL (ref 3.5–5.0)
ALK PHOS: 71 U/L (ref 40–150)
ALT: 10 U/L (ref 0–55)
ANION GAP: 7 meq/L (ref 3–11)
AST: 12 U/L (ref 5–34)
BILIRUBIN TOTAL: 0.52 mg/dL (ref 0.20–1.20)
BUN: 12 mg/dL (ref 7.0–26.0)
CO2: 28 mEq/L (ref 22–29)
Calcium: 9.1 mg/dL (ref 8.4–10.4)
Chloride: 103 mEq/L (ref 98–109)
Creatinine: 1 mg/dL (ref 0.6–1.1)
EGFR: 57 mL/min/{1.73_m2} — AB (ref >90)
Glucose: 182 mg/dl — ABNORMAL HIGH (ref 70–140)
Potassium: 4 mEq/L (ref 3.5–5.1)
Sodium: 138 mEq/L (ref 136–145)
TOTAL PROTEIN: 6.7 g/dL (ref 6.4–8.3)

## 2015-06-29 LAB — CBC WITH DIFFERENTIAL/PLATELET
BASO%: 0.8 % (ref 0.0–2.0)
BASOS ABS: 0 10*3/uL (ref 0.0–0.1)
EOS ABS: 0.1 10*3/uL (ref 0.0–0.5)
EOS%: 1.8 % (ref 0.0–7.0)
HCT: 33.9 % — ABNORMAL LOW (ref 34.8–46.6)
HEMOGLOBIN: 11.3 g/dL — AB (ref 11.6–15.9)
LYMPH%: 21.1 % (ref 14.0–49.7)
MCH: 27 pg (ref 25.1–34.0)
MCHC: 33.2 g/dL (ref 31.5–36.0)
MCV: 81.3 fL (ref 79.5–101.0)
MONO#: 0.3 10*3/uL (ref 0.1–0.9)
MONO%: 6.3 % (ref 0.0–14.0)
NEUT%: 70 % (ref 38.4–76.8)
NEUTROS ABS: 3.7 10*3/uL (ref 1.5–6.5)
PLATELETS: 167 10*3/uL (ref 145–400)
RBC: 4.18 10*6/uL (ref 3.70–5.45)
RDW: 16.6 % — AB (ref 11.2–14.5)
WBC: 5.3 10*3/uL (ref 3.9–10.3)
lymph#: 1.1 10*3/uL (ref 0.9–3.3)

## 2015-06-29 NOTE — Assessment & Plan Note (Signed)
She tolerated treatment well with reduced dose Imbruvica. We will continue the same dose 280 mg daily. I am concerned about new onset of anemia. Examination is unremarkable but the splenic edge is still palpable. I recommend repeating imaging study with PET CT scan to assess and exclude disease progression and she agrees to proceed

## 2015-06-29 NOTE — Assessment & Plan Note (Signed)
The patient has intermittent, recurrence of anemia, worrisome for disease progression. She is not symptomatic. I recommend imaging study and she agreed to proceed

## 2015-06-29 NOTE — Assessment & Plan Note (Signed)
she will continue current medical management.  according to her, she has significant element of white coat hypertension. Her BP monitoring at home show blood pressure is within normal limits I recommend close follow-up with primary care doctor for medication adjustment.

## 2015-06-29 NOTE — Progress Notes (Signed)
[place] OFFICE PROGRESS NOTE  Patient Care Team: Aretta Nip, MD as PCP - General (Family Medicine) Sharyne Peach, MD as Consulting Physician (Ophthalmology) Inda Castle, MD as Consulting Physician (Gastroenterology) Theodis Sato, MD as Consulting Physician (Orthopedic Surgery) Jannette Spanner, MD as Referring Physician (Dermatology) Heath Lark, MD as Consulting Physician (Hematology and Oncology) Alphonsa Overall, MD as Consulting Physician (General Surgery)  SUMMARY OF ONCOLOGIC HISTORY: Oncology History   Marginal zone lymphoma, with lymph node and bone marrow involvement along with splenomegaly   Primary site: Lymphoid Neoplasms   Staging method: AJCC 6th Edition   Clinical: Stage IV signed by Heath Lark, MD on 09/02/2013  7:38 PM   Summary: Stage IV       Marginal zone lymphoma ([place])   07/29/2013 Imaging CT scan shows splenomegaly and abnormal lymphadenopathy.   08/13/2013 Imaging PET CT scan confirmed lymphadenopathy and splenomegaly, those areas are PET avid   08/23/2013 Surgery [id number] left axillary lymph node biopsy confirmed a low-grade lymphoma.   08/27/2013 Bone Marrow Biopsy Bone marrow biopsy confirmed lymphoma.[id number]   09/08/2013 - 09/29/2013 Chemotherapy She completed 4 cycles of rituximab with resolution of splenomegaly.   12/29/2013 Imaging PET scan show complete response.   07/11/2014 Imaging PET scan showed recurrent disease   07/19/2014 - 08/09/2014 Chemotherapy She completed 4 cycles of rituximab with resolution of splenomegaly   10/12/2014 Imaging Repeat PET/CT scan showed near complete response to treatment.   10/13/2014 - 02/09/2015 Chemotherapy She is placed on rituximab maintenance therapy.   03/29/2015 Imaging PET CT showed possible mild progression of splenomegaly   04/26/2015 -  Chemotherapy She started taking Ibrutinib   04/28/2015 Adverse Reaction Treatment was placed temporarily on hold due to severe diarrhea and resumed at reduced dose     INTERVAL HISTORY: Please see below for problem oriented charting. Returns for further follow-up. She has intermittent diarrhea but not severe, well-controlled with Imodium. She complained of mild fatigue. The patient denies any recent signs or symptoms of bleeding such as spontaneous epistaxis, hematuria or hematochezia. Denies recent infection.  REVIEW OF SYSTEMS:   Constitutional: Denies fevers, chills or abnormal weight loss Eyes: Denies blurriness of vision Ears, nose, mouth, throat, and face: Denies mucositis or sore throat Respiratory: Denies cough, dyspnea or wheezes Cardiovascular: Denies palpitation, chest discomfort or lower extremity swelling Skin: Denies abnormal skin rashes Lymphatics: Denies new lymphadenopathy or easy bruising Neurological:Denies numbness, tingling or new weaknesses Behavioral/Psych: Mood is stable, no new changes  All other systems were reviewed with the patient and are negative.  I have reviewed the past medical history, past surgical history, social history and family history with the patient and they are unchanged from previous note.  ALLERGIES:  is allergic to aspirin; capoten; levemir; micronase; onglyza; statins; and zocor.  MEDICATIONS:  Current Outpatient Prescriptions  Medication Sig Dispense Refill  . aspirin 81 MG tablet Take 40.5 mg by mouth 3 (three) times a week.    . Calcium Carbonate-Vit D-Min (CALCIUM 1200 PO) Take 1 tablet by mouth daily.    . cholecalciferol (VITAMIN D) 1000 UNITS tablet Take 1,000 Units by mouth daily. Three times per week    . Cinnamon 500 MG capsule Take 500 mg by mouth daily.    . enalapril (VASOTEC) 20 MG tablet Take 20 mg by mouth every morning.    . fish oil-omega-3 fatty acids 1000 MG capsule Take 1 g by mouth daily.     Marland Kitchen glimepiride (AMARYL) 4 MG tablet Take 4 mg  by mouth daily with breakfast.    . glucose blood test strip Use as instructed 100 each 12  . ibrutinib (IMBRUVICA) 140 MG capsul Take 2  capsules (280 mg total) by mouth daily. 60 capsule 8  . loperamide (IMODIUM) 2 MG capsule Take by mouth as needed for diarrhea or loose stools.    . Magnesium 500 MG CAPS Take 500 mg by mouth daily.     . metFORMIN (GLUCOPHAGE) 1000 MG tablet Take 1,000 mg by mouth daily with breakfast.    . Misc Natural Products (LUTEIN 20 PO) Take by mouth daily.    . Multiple Vitamins-Minerals (CENTRUM CARDIO) TABS Take 1 tablet by mouth daily.    . Multiple Vitamins-Minerals (ICAPS) CAPS Take by mouth daily.    . Red Yeast Rice 600 MG TABS Take 600 mg by mouth daily.      No current facility-administered medications for this visit.    PHYSICAL EXAMINATION: ECOG PERFORMANCE STATUS: 0 - Asymptomatic  Filed Vitals:   06/29/15 0958  BP: 174/68  Pulse: 78  Temp: 98.3 F (36.8 C)  Resp: 18   Filed Weights   06/29/15 0958  Weight: 163 lb 8 oz (74.163 kg)    GENERAL:alert, no distress and comfortable SKIN: skin color, texture, turgor are normal, no rashes or significant lesions EYES: normal, Conjunctiva are pink and non-injected, sclera clear OROPHARYNX:no exudate, no erythema and lips, buccal mucosa, and tongue normal  NECK: supple, thyroid normal size, non-tender, without nodularity LYMPH:  no palpable lymphadenopathy in the cervical, axillary or inguinal LUNGS: clear to auscultation and percussion with normal breathing effort HEART: regular rate & rhythm and no murmurs and no lower extremity edema ABDOMEN:abdomen soft, non-tender and normal bowel sounds. The splenic tip is palpable under the left rib cage Musculoskeletal:no cyanosis of digits and no clubbing  NEURO: alert & oriented x 3 with fluent speech, no focal motor/sensory deficits  LABORATORY DATA:  I have reviewed the data as listed    Component Value Date/Time   NA 138 06/29/2015 0935   NA 137 08/27/2013 0710   K 4.0 06/29/2015 0935   K 4.3 08/27/2013 0710   CL 100 08/27/2013 0710   CO2 28 06/29/2015 0935   CO2 22 08/27/2013  0710   GLUCOSE 182* 06/29/2015 0935   GLUCOSE 180* 08/27/2013 0710   BUN 12.0 06/29/2015 0935   BUN 16 08/27/2013 0710   CREATININE 1.0 06/29/2015 0935   CREATININE 0.87 08/27/2013 0710   CREATININE 1.12* 05/18/2013 1256   CALCIUM 9.1 06/29/2015 0935   CALCIUM 9.4 08/27/2013 0710   PROT 6.7 06/29/2015 0935   PROT 7.3 08/27/2013 0710   ALBUMIN 3.5 06/29/2015 0935   ALBUMIN 2.9* 08/27/2013 0710   AST 12 06/29/2015 0935   AST 18 08/27/2013 0710   ALT 10 06/29/2015 0935   ALT 16 08/27/2013 0710   ALKPHOS 71 06/29/2015 0935   ALKPHOS 134* 08/27/2013 0710   BILITOT 0.52 06/29/2015 0935   BILITOT 0.4 08/27/2013 0710   GFRNONAA 62* 08/27/2013 0710   GFRNONAA 47* 05/18/2013 1256   GFRAA 72* 08/27/2013 0710   GFRAA 55* 05/18/2013 1256    No results found for: SPEP, UPEP  Lab Results  Component Value Date   WBC 5.3 06/29/2015   NEUTROABS 3.7 06/29/2015   HGB 11.3* 06/29/2015   HCT 33.9* 06/29/2015   MCV 81.3 06/29/2015   PLT 167 06/29/2015      Chemistry      Component Value Date/Time   NA  138 06/29/2015 0935   NA 137 08/27/2013 0710   K 4.0 06/29/2015 0935   K 4.3 08/27/2013 0710   CL 100 08/27/2013 0710   CO2 28 06/29/2015 0935   CO2 22 08/27/2013 0710   BUN 12.0 06/29/2015 0935   BUN 16 08/27/2013 0710   CREATININE 1.0 06/29/2015 0935   CREATININE 0.87 08/27/2013 0710   CREATININE 1.12* 05/18/2013 1256   GLU 202* 08/09/2014 1000      Component Value Date/Time   CALCIUM 9.1 06/29/2015 0935   CALCIUM 9.4 08/27/2013 0710   ALKPHOS 71 06/29/2015 0935   ALKPHOS 134* 08/27/2013 0710   AST 12 06/29/2015 0935   AST 18 08/27/2013 0710   ALT 10 06/29/2015 0935   ALT 16 08/27/2013 0710   BILITOT 0.52 06/29/2015 0935   BILITOT 0.4 08/27/2013 0710      ASSESSMENT & PLAN:  Marginal zone lymphoma She tolerated treatment well with reduced dose Imbruvica. We will continue the same dose 280 mg daily. I am concerned about new onset of anemia. Examination is  unremarkable but the splenic edge is still palpable. I recommend repeating imaging study with PET CT scan to assess and exclude disease progression and she agrees to proceed  Anemia in neoplastic disease The patient has intermittent, recurrence of anemia, worrisome for disease progression. She is not symptomatic. I recommend imaging study and she agreed to proceed  Essential hypertension she will continue current medical management.  according to her, she has significant element of white coat hypertension. Her BP monitoring at home show blood pressure is within normal limits I recommend close follow-up with primary care doctor for medication adjustment.   Orders Placed This Encounter  Procedures  . NM PET Image Restag (PS) Skull Base To Thigh    Standing Status: Future     Number of Occurrences:      Standing Expiration Date: 08/28/2016    Order Specific Question:  Reason for Exam (SYMPTOM  OR DIAGNOSIS REQUIRED)    Answer:  staging lymphoma, progressive anemia, exclude progression    Order Specific Question:  Preferred imaging location?    Answer:  [facility]   All questions were answered. The patient knows to call the clinic with any problems, questions or concerns. No barriers to learning was detected. I spent 15 minutes counseling the patient face to face. The total time spent in the appointment was 20 minutes and more than 50% was on counseling and review of test results     [facility], [place], MD 06/29/2015 2:17 PM

## 2015-06-29 NOTE — Telephone Encounter (Signed)
Gave and printed appt sched and avs for pt for june °

## 2015-07-03 ENCOUNTER — Ambulatory Visit (HOSPITAL_COMMUNITY)
Admission: RE | Admit: 2015-07-03 | Discharge: 2015-07-03 | Disposition: A | Discharge status: Home / Self Care | Payer: Medicare Other | Source: Ambulatory Visit | Attending: Hematology and Oncology | Admitting: Hematology and Oncology

## 2015-07-03 DIAGNOSIS — C8307 Small cell B-cell lymphoma, spleen: Secondary | ICD-10-CM

## 2015-07-03 DIAGNOSIS — I2510 Atherosclerotic heart disease of native coronary artery without angina pectoris: Secondary | ICD-10-CM | POA: Insufficient documentation

## 2015-07-03 LAB — GLUCOSE, CAPILLARY: GLUCOSE-CAPILLARY: 118 mg/dL — AB (ref 65–99)

## 2015-07-03 MED ORDER — FLUDEOXYGLUCOSE F - 18 (FDG) INJECTION
8.0000 | Freq: Once | INTRAVENOUS | Status: AC | PRN
  Administered 2015-07-03: 8 via INTRAVENOUS

## 2015-07-07 ENCOUNTER — Telehealth: Payer: Self-pay | Admitting: Hematology and Oncology

## 2015-07-07 ENCOUNTER — Encounter: Payer: Self-pay | Admitting: Hematology and Oncology

## 2015-07-07 ENCOUNTER — Ambulatory Visit (HOSPITAL_BASED_OUTPATIENT_CLINIC_OR_DEPARTMENT_OTHER): Payer: Medicare Other | Admitting: Hematology and Oncology

## 2015-07-07 VITALS — BP 189/68 | HR 74 | Temp 97.8°F | Resp 18 | Ht 64.0 in | Wt 162.9 lb

## 2015-07-07 DIAGNOSIS — C8588 Other specified types of non-Hodgkin lymphoma, lymph nodes of multiple sites: Secondary | ICD-10-CM

## 2015-07-07 DIAGNOSIS — D630 Anemia in neoplastic disease: Secondary | ICD-10-CM

## 2015-07-07 DIAGNOSIS — C8580 Other specified types of non-Hodgkin lymphoma, unspecified site: Secondary | ICD-10-CM

## 2015-07-07 NOTE — Assessment & Plan Note (Signed)
PET CT scan showed positive response to treatment. I will see her back in 3 months for repeat history, physical examination and blood work. She will continue current dose Ibrutinib

## 2015-07-07 NOTE — Telephone Encounter (Signed)
Gave patient avs report and appointments for October 2017. °

## 2015-07-07 NOTE — Progress Notes (Signed)
[place] OFFICE PROGRESS NOTE  Patient Care Team: Aretta Nip, MD as PCP - General (Family Medicine) Sharyne Peach, MD as Consulting Physician (Ophthalmology) Inda Castle, MD as Consulting Physician (Gastroenterology) Theodis Sato, MD as Consulting Physician (Orthopedic Surgery) Jannette Spanner, MD as Referring Physician (Dermatology) Heath Lark, MD as Consulting Physician (Hematology and Oncology) Alphonsa Overall, MD as Consulting Physician (General Surgery)  SUMMARY OF ONCOLOGIC HISTORY: Oncology History   Marginal zone lymphoma, with lymph node and bone marrow involvement along with splenomegaly   Primary site: Lymphoid Neoplasms   Staging method: AJCC 6th Edition   Clinical: Stage IV signed by Heath Lark, MD on 09/02/2013  7:38 PM   Summary: Stage IV       Marginal zone lymphoma ([place])   07/29/2013 Imaging CT scan shows splenomegaly and abnormal lymphadenopathy.   08/13/2013 Imaging PET CT scan confirmed lymphadenopathy and splenomegaly, those areas are PET avid   08/23/2013 Surgery [id number] left axillary lymph node biopsy confirmed a low-grade lymphoma.   08/27/2013 Bone Marrow Biopsy Bone marrow biopsy confirmed lymphoma.[id number]   09/08/2013 - 09/29/2013 Chemotherapy She completed 4 cycles of rituximab with resolution of splenomegaly.   12/29/2013 Imaging PET scan show complete response.   07/11/2014 Imaging PET scan showed recurrent disease   07/19/2014 - 08/09/2014 Chemotherapy She completed 4 cycles of rituximab with resolution of splenomegaly   10/12/2014 Imaging Repeat PET/CT scan showed near complete response to treatment.   10/13/2014 - 02/09/2015 Chemotherapy She is placed on rituximab maintenance therapy.   03/29/2015 Imaging PET CT showed possible mild progression of splenomegaly   04/26/2015 -  Chemotherapy She started taking Ibrutinib   04/28/2015 Adverse Reaction Treatment was placed temporarily on hold due to severe diarrhea and resumed at reduced dose    07/03/2015 PET scan No hypermetabolic adenopathy in the neck, chest, abdomen or pelvis.2. Spleen is not hypermetabolic and appears slightly less prominent size.    INTERVAL HISTORY: Please see below for problem oriented charting. She received a further follow-up. She tolerated treatment well apart from mild diarrhea Denies pain  REVIEW OF SYSTEMS:   Constitutional: Denies fevers, chills or abnormal weight loss Eyes: Denies blurriness of vision Ears, nose, mouth, throat, and face: Denies mucositis or sore throat Respiratory: Denies cough, dyspnea or wheezes Cardiovascular: Denies palpitation, chest discomfort or lower extremity swelling Gastrointestinal:  Denies nausea, heartburn or change in bowel habits Skin: Denies abnormal skin rashes Lymphatics: Denies new lymphadenopathy or easy bruising Neurological:Denies numbness, tingling or new weaknesses Behavioral/Psych: Mood is stable, no new changes  All other systems were reviewed with the patient and are negative.  I have reviewed the past medical history, past surgical history, social history and family history with the patient and they are unchanged from previous note.  ALLERGIES:  is allergic to aspirin; capoten; levemir; micronase; onglyza; statins; and zocor.  MEDICATIONS:  Current Outpatient Prescriptions  Medication Sig Dispense Refill  . aspirin 81 MG tablet Take 40.5 mg by mouth 3 (three) times a week.    . Calcium Carbonate-Vit D-Min (CALCIUM 1200 PO) Take 1 tablet by mouth daily.    . cholecalciferol (VITAMIN D) 1000 UNITS tablet Take 1,000 Units by mouth daily.     . Cinnamon 500 MG capsule Take 500 mg by mouth daily.    . enalapril (VASOTEC) 20 MG tablet Take 20 mg by mouth every morning.    . fish oil-omega-3 fatty acids 1000 MG capsule Take 1 g by mouth daily.     Marland Kitchen  glimepiride (AMARYL) 4 MG tablet Take 4 mg by mouth daily with breakfast.    . glucose blood test strip Use as instructed 100 each 12  . ibrutinib  (IMBRUVICA) 140 MG capsul Take 2 capsules (280 mg total) by mouth daily. 60 capsule 8  . loperamide (IMODIUM) 2 MG capsule Take by mouth as needed for diarrhea or loose stools.    . Magnesium 500 MG CAPS Take 500 mg by mouth daily.     . metFORMIN (GLUCOPHAGE) 1000 MG tablet Take 1,000 mg by mouth 2 (two) times daily with a meal.     . Misc Natural Products (LUTEIN 20 PO) Take by mouth daily.    . Multiple Vitamins-Minerals (CENTRUM CARDIO) TABS Take 1 tablet by mouth daily.    . Multiple Vitamins-Minerals (ICAPS) CAPS Take by mouth daily.    . Red Yeast Rice 600 MG TABS Take 600 mg by mouth daily.      No current facility-administered medications for this visit.    PHYSICAL EXAMINATION: ECOG PERFORMANCE STATUS: 0 - Asymptomatic  Filed Vitals:   07/07/15 1110  BP: 189/68  Pulse: 74  Temp: 97.8 F (36.6 C)  Resp: 18   Filed Weights   07/07/15 1110  Weight: 162 lb 14.4 oz (73.891 kg)    GENERAL:alert, no distress and comfortable SKIN: skin color, texture, turgor are normal, no rashes or significant lesions EYES: normal, Conjunctiva are pink and non-injected, sclera clear NEURO: alert & oriented x 3 with fluent speech, no focal motor/sensory deficits  LABORATORY DATA:  I have reviewed the data as listed    Component Value Date/Time   NA 138 06/29/2015 0935   NA 137 08/27/2013 0710   K 4.0 06/29/2015 0935   K 4.3 08/27/2013 0710   CL 100 08/27/2013 0710   CO2 28 06/29/2015 0935   CO2 22 08/27/2013 0710   GLUCOSE 182* 06/29/2015 0935   GLUCOSE 180* 08/27/2013 0710   BUN 12.0 06/29/2015 0935   BUN 16 08/27/2013 0710   CREATININE 1.0 06/29/2015 0935   CREATININE 0.87 08/27/2013 0710   CREATININE 1.12* 05/18/2013 1256   CALCIUM 9.1 06/29/2015 0935   CALCIUM 9.4 08/27/2013 0710   PROT 6.7 06/29/2015 0935   PROT 7.3 08/27/2013 0710   ALBUMIN 3.5 06/29/2015 0935   ALBUMIN 2.9* 08/27/2013 0710   AST 12 06/29/2015 0935   AST 18 08/27/2013 0710   ALT 10 06/29/2015 0935    ALT 16 08/27/2013 0710   ALKPHOS 71 06/29/2015 0935   ALKPHOS 134* 08/27/2013 0710   BILITOT 0.52 06/29/2015 0935   BILITOT 0.4 08/27/2013 0710   GFRNONAA 62* 08/27/2013 0710   GFRNONAA 47* 05/18/2013 1256   GFRAA 72* 08/27/2013 0710   GFRAA 55* 05/18/2013 1256    No results found for: SPEP, UPEP  Lab Results  Component Value Date   WBC 5.3 06/29/2015   NEUTROABS 3.7 06/29/2015   HGB 11.3* 06/29/2015   HCT 33.9* 06/29/2015   MCV 81.3 06/29/2015   PLT 167 06/29/2015      Chemistry      Component Value Date/Time   NA 138 06/29/2015 0935   NA 137 08/27/2013 0710   K 4.0 06/29/2015 0935   K 4.3 08/27/2013 0710   CL 100 08/27/2013 0710   CO2 28 06/29/2015 0935   CO2 22 08/27/2013 0710   BUN 12.0 06/29/2015 0935   BUN 16 08/27/2013 0710   CREATININE 1.0 06/29/2015 0935   CREATININE 0.87 08/27/2013 0710   CREATININE  1.12* 05/18/2013 1256   GLU 202* 08/09/2014 1000      Component Value Date/Time   CALCIUM 9.1 06/29/2015 0935   CALCIUM 9.4 08/27/2013 0710   ALKPHOS 71 06/29/2015 0935   ALKPHOS 134* 08/27/2013 0710   AST 12 06/29/2015 0935   AST 18 08/27/2013 0710   ALT 10 06/29/2015 0935   ALT 16 08/27/2013 0710   BILITOT 0.52 06/29/2015 0935   BILITOT 0.4 08/27/2013 0710       RADIOGRAPHIC STUDIES:I reviewed PET CT scan with her and her son I have personally reviewed the radiological images as listed and agreed with the findings in the report.    ASSESSMENT & PLAN:  Marginal zone lymphoma PET CT scan showed positive response to treatment. I will see her back in 3 months for repeat history, physical examination and blood work. She will continue current dose Ibrutinib  Anemia in neoplastic disease The patient has intermittent, recurrence of anemia, likely due to anemia of chronic disease due to poorly controlled diabetes. She is not symptomatic. Observe only for now   No orders of the defined types were placed in this encounter.   All questions were  answered. The patient knows to call the clinic with any problems, questions or concerns. No barriers to learning was detected. I spent 10 minutes counseling the patient face to face. The total time spent in the appointment was 15 minutes and more than 50% was on counseling and review of test results     [facility], [place], MD 07/07/2015 11:40 AM

## 2015-07-07 NOTE — Assessment & Plan Note (Signed)
The patient has intermittent, recurrence of anemia, likely due to anemia of chronic disease due to poorly controlled diabetes. She is not symptomatic. Observe only for now

## 2015-07-14 ENCOUNTER — Telehealth: Payer: Self-pay | Admitting: Pharmacist

## 2015-07-14 NOTE — Telephone Encounter (Signed)
07-14-15: Attempted to reach patient for follow up on oral medication: Imbruvica. No answer. Left VM for patient to call back with any questions or issues. Will f/u with next MD visit.  Thank you,  Skip Mayer, PharmD, [facility] [phone]

## 2015-11-03 ENCOUNTER — Ambulatory Visit (HOSPITAL_BASED_OUTPATIENT_CLINIC_OR_DEPARTMENT_OTHER): Payer: Medicare Other | Admitting: Hematology and Oncology

## 2015-11-03 ENCOUNTER — Telehealth: Payer: Self-pay

## 2015-11-03 ENCOUNTER — Other Ambulatory Visit (HOSPITAL_BASED_OUTPATIENT_CLINIC_OR_DEPARTMENT_OTHER): Payer: Medicare Other

## 2015-11-03 ENCOUNTER — Encounter: Payer: Self-pay | Admitting: Hematology and Oncology

## 2015-11-03 VITALS — BP 192/62 | HR 81 | Temp 98.4°F | Resp 18 | Wt 158.3 lb

## 2015-11-03 DIAGNOSIS — Z23 Encounter for immunization: Secondary | ICD-10-CM | POA: Diagnosis not present

## 2015-11-03 DIAGNOSIS — N183 Chronic kidney disease, stage 3 unspecified: Secondary | ICD-10-CM

## 2015-11-03 DIAGNOSIS — C8588 Other specified types of non-Hodgkin lymphoma, lymph nodes of multiple sites: Secondary | ICD-10-CM

## 2015-11-03 DIAGNOSIS — D630 Anemia in neoplastic disease: Secondary | ICD-10-CM | POA: Diagnosis not present

## 2015-11-03 DIAGNOSIS — K521 Toxic gastroenteritis and colitis: Secondary | ICD-10-CM | POA: Diagnosis not present

## 2015-11-03 DIAGNOSIS — C8580 Other specified types of non-Hodgkin lymphoma, unspecified site: Secondary | ICD-10-CM

## 2015-11-03 LAB — CBC WITH DIFFERENTIAL/PLATELET
BASO%: 0.3 % (ref 0.0–2.0)
Basophils Absolute: 0 10*3/uL (ref 0.0–0.1)
EOS ABS: 0 10*3/uL (ref 0.0–0.5)
EOS%: 0.6 % (ref 0.0–7.0)
HEMATOCRIT: 32.5 % — AB (ref 34.8–46.6)
HGB: 11 g/dL — ABNORMAL LOW (ref 11.6–15.9)
LYMPH#: 1.4 10*3/uL (ref 0.9–3.3)
LYMPH%: 21.7 % (ref 14.0–49.7)
MCH: 28.3 pg (ref 25.1–34.0)
MCHC: 33.8 g/dL (ref 31.5–36.0)
MCV: 83.5 fL (ref 79.5–101.0)
MONO#: 0.4 10*3/uL (ref 0.1–0.9)
MONO%: 6.2 % (ref 0.0–14.0)
NEUT%: 71.2 % (ref 38.4–76.8)
NEUTROS ABS: 4.6 10*3/uL (ref 1.5–6.5)
PLATELETS: 166 10*3/uL (ref 145–400)
RBC: 3.89 10*6/uL (ref 3.70–5.45)
RDW: 14.1 % (ref 11.2–14.5)
WBC: 6.4 10*3/uL (ref 3.9–10.3)

## 2015-11-03 LAB — COMPREHENSIVE METABOLIC PANEL
ALBUMIN: 3.5 g/dL (ref 3.5–5.0)
ALK PHOS: 88 U/L (ref 40–150)
ALT: 12 U/L (ref 0–55)
ANION GAP: 9 meq/L (ref 3–11)
AST: 16 U/L (ref 5–34)
BUN: 17.7 mg/dL (ref 7.0–26.0)
CALCIUM: 9.6 mg/dL (ref 8.4–10.4)
CO2: 26 mEq/L (ref 22–29)
CREATININE: 1.2 mg/dL — AB (ref 0.6–1.1)
Chloride: 103 mEq/L (ref 98–109)
EGFR: 44 mL/min/{1.73_m2} — ABNORMAL LOW (ref >90)
Glucose: 258 mg/dl — ABNORMAL HIGH (ref 70–140)
POTASSIUM: 4.5 meq/L (ref 3.5–5.1)
Sodium: 138 mEq/L (ref 136–145)
Total Bilirubin: 0.51 mg/dL (ref 0.20–1.20)
Total Protein: 6.7 g/dL (ref 6.4–8.3)

## 2015-11-03 MED ORDER — INFLUENZA VAC SPLIT QUAD 0.5 ML IM SUSY
0.5000 mL | PREFILLED_SYRINGE | Freq: Once | INTRAMUSCULAR | Status: AC
  Administered 2015-11-03: 0.5 mL via INTRAMUSCULAR
  Filled 2015-11-03: qty 0.5

## 2015-11-03 NOTE — Progress Notes (Signed)
[facility] OFFICE PROGRESS NOTE  Patient Care Team: Aretta Nip, MD as PCP - General (Family Medicine) Sharyne Peach, MD as Consulting Physician (Ophthalmology) Inda Castle, MD as Consulting Physician (Gastroenterology) Theodis Sato, MD (Inactive) as Consulting Physician (Orthopedic Surgery) Jannette Spanner, MD as Referring Physician (Dermatology) Heath Lark, MD as Consulting Physician (Hematology and Oncology) Alphonsa Overall, MD as Consulting Physician (General Surgery)  SUMMARY OF ONCOLOGIC HISTORY: Oncology History   Marginal zone lymphoma, with lymph node and bone marrow involvement along with splenomegaly   Primary site: Lymphoid Neoplasms   Staging method: AJCC 6th Edition   Clinical: Stage IV signed by Heath Lark, MD on 09/02/2013  7:38 PM   Summary: Stage IV       Marginal zone lymphoma ([place])   07/29/2013 Imaging    CT scan shows splenomegaly and abnormal lymphadenopathy.      08/13/2013 Imaging    PET CT scan confirmed lymphadenopathy and splenomegaly, those areas are PET avid      08/23/2013 Surgery    [id number] left axillary lymph node biopsy confirmed a low-grade lymphoma.      08/27/2013 Bone Marrow Biopsy    Bone marrow biopsy confirmed lymphoma.[id number]      09/08/2013 - 09/29/2013 Chemotherapy    She completed 4 cycles of rituximab with resolution of splenomegaly.      12/29/2013 Imaging    PET scan show complete response.      07/11/2014 Imaging    PET scan showed recurrent disease      07/19/2014 - 08/09/2014 Chemotherapy    She completed 4 cycles of rituximab with resolution of splenomegaly      10/12/2014 Imaging    Repeat PET/CT scan showed near complete response to treatment.      10/13/2014 - 02/09/2015 Chemotherapy    She is placed on rituximab maintenance therapy.      03/29/2015 Imaging    PET CT showed possible mild progression of splenomegaly      04/26/2015 -  Chemotherapy    She started taking Ibrutinib      04/28/2015 Adverse Reaction    Treatment was placed temporarily on hold due to severe diarrhea and resumed at reduced dose      07/03/2015 PET scan    No hypermetabolic adenopathy in the neck, chest, abdomen or pelvis.2. Spleen is not hypermetabolic and appears slightly less prominent size.       INTERVAL HISTORY: Please see below for problem oriented charting. She returns for further follow-up. She is doing very well. Denies recent infection. No new lymphadenopathy. She had recent diarrhea with treatment. She is using Imodium She has easy bruising but denies any recent signs or symptoms of bleeding such as spontaneous epistaxis, hematuria or hematochezia. She has lost a bit overweight  REVIEW OF SYSTEMS:   Constitutional: Denies fevers, chills  Eyes: Denies blurriness of vision Ears, nose, mouth, throat, and face: Denies mucositis or sore throat Respiratory: Denies cough, dyspnea or wheezes Cardiovascular: Denies palpitation, chest discomfort or lower extremity swelling Skin: Denies abnormal skin rashes Lymphatics: Denies new lymphadenopathy  Neurological:Denies numbness, tingling or new weaknesses Behavioral/Psych: Mood is stable, no new changes  All other systems were reviewed with the patient and are negative.  I have reviewed the past medical history, past surgical history, social history and family history with the patient and they are unchanged from previous note.  ALLERGIES:  is allergic to aspirin; capoten [captopril]; levemir [insulin detemir]; micronase [glyburide]; onglyza [saxagliptin]; statins; and zocor [simvastatin].  MEDICATIONS:  Current Outpatient Prescriptions  Medication Sig Dispense Refill  . aspirin 81 MG tablet Take 40.5 mg by mouth 3 (three) times a week.    . Calcium Carbonate-Vit D-Min (CALCIUM 1200 PO) Take 1 tablet by mouth daily.    . cholecalciferol (VITAMIN D) 1000 UNITS tablet Take 1,000 Units by mouth daily.     . Cinnamon 500 MG capsule Take  500 mg by mouth daily.    . enalapril (VASOTEC) 20 MG tablet Take 20 mg by mouth every morning.    . fish oil-omega-3 fatty acids 1000 MG capsule Take 1 g by mouth daily.     Marland Kitchen glimepiride (AMARYL) 4 MG tablet Take 4 mg by mouth daily with breakfast.    . glucose blood test strip Use as instructed 100 each 12  . ibrutinib (IMBRUVICA) 140 MG capsul Take 2 capsules (280 mg total) by mouth daily. 60 capsule 8  . loperamide (IMODIUM) 2 MG capsule Take by mouth as needed for diarrhea or loose stools.    . Magnesium 500 MG CAPS Take 500 mg by mouth daily.     . metFORMIN (GLUCOPHAGE) 1000 MG tablet Take 1,000 mg by mouth 2 (two) times daily with a meal.     . Misc Natural Products (LUTEIN 20 PO) Take by mouth daily.    . Multiple Vitamins-Minerals (CENTRUM CARDIO) TABS Take 1 tablet by mouth daily.    . Multiple Vitamins-Minerals (ICAPS) CAPS Take by mouth daily.    . Red Yeast Rice 600 MG TABS Take 600 mg by mouth daily.      No current facility-administered medications for this visit.     PHYSICAL EXAMINATION: ECOG PERFORMANCE STATUS: 1 - Symptomatic but completely ambulatory  Vitals:   11/03/15 1149  BP: (!) 192/62  Pulse: 81  Resp: 18  Temp: 98.4 F (36.9 C)   Filed Weights   11/03/15 1149  Weight: 158 lb 4.8 oz (71.8 kg)    GENERAL:alert, no distress and comfortable SKIN: skin color, texture, turgor are normal, no rashes or significant lesions. Noted minor bruising EYES: normal, Conjunctiva are pink and non-injected, sclera clear OROPHARYNX:no exudate, no erythema and lips, buccal mucosa, and tongue normal  NECK: supple, thyroid normal size, non-tender, without nodularity LYMPH:  no palpable lymphadenopathy in the cervical, axillary or inguinal LUNGS: clear to auscultation and percussion with normal breathing effort HEART: regular rate & rhythm and no murmurs and no lower extremity edema ABDOMEN:abdomen soft, non-tender and normal bowel sounds Musculoskeletal:no cyanosis of  digits and no clubbing  NEURO: alert & oriented x 3 with fluent speech, no focal motor/sensory deficits  LABORATORY DATA:  I have reviewed the data as listed    Component Value Date/Time   NA 138 11/03/2015 1138   K 4.5 11/03/2015 1138   CL 100 08/27/2013 0710   CO2 26 11/03/2015 1138   GLUCOSE 258 (H) 11/03/2015 1138   BUN 17.7 11/03/2015 1138   CREATININE 1.2 (H) 11/03/2015 1138   CALCIUM 9.6 11/03/2015 1138   PROT 6.7 11/03/2015 1138   ALBUMIN 3.5 11/03/2015 1138   AST 16 11/03/2015 1138   ALT 12 11/03/2015 1138   ALKPHOS 88 11/03/2015 1138   BILITOT 0.51 11/03/2015 1138   GFRNONAA 62 (L) 08/27/2013 0710   GFRNONAA 47 (L) 05/18/2013 1256   GFRAA 72 (L) 08/27/2013 0710   GFRAA 55 (L) 05/18/2013 1256    No results found for: SPEP, UPEP  Lab Results  Component Value Date   WBC 6.4  11/03/2015   NEUTROABS 4.6 11/03/2015   HGB 11.0 (L) 11/03/2015   HCT 32.5 (L) 11/03/2015   MCV 83.5 11/03/2015   PLT 166 11/03/2015      Chemistry      Component Value Date/Time   NA 138 11/03/2015 1138   K 4.5 11/03/2015 1138   CL 100 08/27/2013 0710   CO2 26 11/03/2015 1138   BUN 17.7 11/03/2015 1138   CREATININE 1.2 (H) 11/03/2015 1138   GLU 202 (H) 08/09/2014 1000      Component Value Date/Time   CALCIUM 9.6 11/03/2015 1138   ALKPHOS 88 11/03/2015 1138   AST 16 11/03/2015 1138   ALT 12 11/03/2015 1138   BILITOT 0.51 11/03/2015 1138      ASSESSMENT & PLAN:  Marginal zone lymphoma PET CT scan in June showed positive response to treatment. I will see her back in 3 months for repeat history, physical examination, imaging study and blood work. She will continue current dose Ibrutinib  Anemia in neoplastic disease The patient has intermittent, recurrence of anemia, likely due to anemia of chronic disease due to poorly controlled diabetes and mild chronic kidney disease She is not symptomatic. Observe only for now  Chronic kidney disease (CKD), stage III (moderate) Her  kidney function tests is stable. Continue close monitoring. There is no contraindication to continue treatment for now.   Diarrhea due to drug She had recent severe diarrhea likely due to chemotherapy. I recommend she continues to take Imodium as needed   We discussed the importance of preventive care and reviewed the vaccination programs. She does not have any prior allergic reactions to influenza vaccination. She agrees to proceed with influenza vaccination today and we will administer it today at the clinic.   Orders Placed This Encounter  Procedures  . NM PET Image Restag (PS) Skull Base To Thigh    Standing Status:   Future    Standing Expiration Date:   12/07/2016    Order Specific Question:   Reason for exam:    Answer:   staging lymphoma    Order Specific Question:   Preferred imaging location?    Answer:   [facility]   All questions were answered. The patient knows to call the clinic with any problems, questions or concerns. No barriers to learning was detected. I spent 20 minutes counseling the patient face to face. The total time spent in the appointment was 30 minutes and more than 50% was on counseling and review of test results     Heath Lark, MD 11/03/2015 1:28 PM

## 2015-11-03 NOTE — Assessment & Plan Note (Signed)
Her kidney function tests is stable. Continue close monitoring. There is no contraindication to continue treatment for now.  

## 2015-11-03 NOTE — Assessment & Plan Note (Signed)
She had recent severe diarrhea likely due to chemotherapy. I recommend she continues to take Imodium as needed

## 2015-11-03 NOTE — Assessment & Plan Note (Addendum)
PET CT scan in June showed positive response to treatment. I will see her back in 3 months for repeat history, physical examination, imaging study and blood work. She will continue current dose Ibrutinib

## 2015-11-03 NOTE — Telephone Encounter (Signed)
Appointments made,avs and calendar printed for patient 

## 2015-11-03 NOTE — Assessment & Plan Note (Addendum)
The patient has intermittent, recurrence of anemia, likely due to anemia of chronic disease due to poorly controlled diabetes and mild chronic kidney disease She is not symptomatic. Observe only for now 

## 2016-01-11 ENCOUNTER — Telehealth: Payer: Self-pay | Admitting: *Deleted

## 2016-01-11 NOTE — Telephone Encounter (Signed)
VM from [place] at [place] requesting call back for some information.   I called back and they said they were missing a page from the Patient Assistance application that was sent to them.  I requested they fax over entire application for [place] to complete because I do not know where the application or the missing page is.  She said she will fax over new application for patient assistance to complete.

## 2016-01-12 NOTE — Telephone Encounter (Signed)
Pt says she sent over the pages of application to be completed by the patient.  We will complete the pages for Health Care Provider and fax over.   Form complete/ signed by Dr. Alvy Bimler and faxed to [place] patient assistance.

## 2016-01-12 NOTE — Telephone Encounter (Signed)
Received new, blank application from [employer].  S/w our Oral Chemo Navigator and Managed Care Dept..  Neither of them had completed or sent an application for pt to [employer].  I called pt and left her a VM to please return nurse's call to let us know if she has the application.  She will need to resend it due to a missing page.

## 2016-01-23 ENCOUNTER — Telehealth: Payer: Self-pay | Admitting: *Deleted

## 2016-01-23 NOTE — Telephone Encounter (Signed)
Notified by Radiology Scheduler of PET cannot be scheduled until 1/9.  Pt has Lab on 1/4 and Dr. Alvy Bimler on 1/5.   Dr. Alvy Bimler instructs for lab to be moved to same day as PET on 1/9 and MD appt moved from 1/5 to 1/12.  I sent Scheduling message.  I also left VM for pt informing her of changes and asking her to call nurse back to confirm.

## 2016-01-24 ENCOUNTER — Telehealth: Payer: Self-pay | Admitting: Hematology and Oncology

## 2016-01-24 NOTE — Telephone Encounter (Signed)
Per 12/26 sch msg moved 1/4 lab to 1/9 and 1/5 f/u to 1/12. Left message for patient and mailed schedule.

## 2016-02-01 ENCOUNTER — Other Ambulatory Visit: Payer: Medicare Other

## 2016-02-02 ENCOUNTER — Ambulatory Visit: Payer: Medicare Other | Admitting: Hematology and Oncology

## 2016-02-06 ENCOUNTER — Other Ambulatory Visit (HOSPITAL_BASED_OUTPATIENT_CLINIC_OR_DEPARTMENT_OTHER): Payer: Medicare Other

## 2016-02-06 ENCOUNTER — Encounter (HOSPITAL_COMMUNITY)
Admission: RE | Admit: 2016-02-06 | Discharge: 2016-02-06 | Disposition: A | Discharge status: Home / Self Care | Payer: Medicare Other | Source: Ambulatory Visit | Attending: Hematology and Oncology | Admitting: Hematology and Oncology

## 2016-02-06 DIAGNOSIS — C8580 Other specified types of non-Hodgkin lymphoma, unspecified site: Secondary | ICD-10-CM | POA: Insufficient documentation

## 2016-02-06 DIAGNOSIS — C8588 Other specified types of non-Hodgkin lymphoma, lymph nodes of multiple sites: Secondary | ICD-10-CM

## 2016-02-06 LAB — COMPREHENSIVE METABOLIC PANEL
ALT: 19 U/L (ref 0–55)
ANION GAP: 9 meq/L (ref 3–11)
AST: 17 U/L (ref 5–34)
Albumin: 3.6 g/dL (ref 3.5–5.0)
Alkaline Phosphatase: 83 U/L (ref 40–150)
BUN: 14.8 mg/dL (ref 7.0–26.0)
CHLORIDE: 104 meq/L (ref 98–109)
CO2: 26 meq/L (ref 22–29)
Calcium: 9 mg/dL (ref 8.4–10.4)
Creatinine: 1 mg/dL (ref 0.6–1.1)
EGFR: 52 mL/min/{1.73_m2} — AB (ref >90)
Glucose: 180 mg/dl — ABNORMAL HIGH (ref 70–140)
Potassium: 4.1 mEq/L (ref 3.5–5.1)
Sodium: 140 mEq/L (ref 136–145)
Total Bilirubin: 0.55 mg/dL (ref 0.20–1.20)
Total Protein: 6.7 g/dL (ref 6.4–8.3)

## 2016-02-06 LAB — CBC WITH DIFFERENTIAL/PLATELET
BASO%: 0.4 % (ref 0.0–2.0)
Basophils Absolute: 0 10*3/uL (ref 0.0–0.1)
EOS%: 0.7 % (ref 0.0–7.0)
Eosinophils Absolute: 0 10*3/uL (ref 0.0–0.5)
HCT: 33.8 % — ABNORMAL LOW (ref 34.8–46.6)
HGB: 11.3 g/dL — ABNORMAL LOW (ref 11.6–15.9)
LYMPH%: 19.1 % (ref 14.0–49.7)
MCH: 27.6 pg (ref 25.1–34.0)
MCHC: 33.4 g/dL (ref 31.5–36.0)
MCV: 82.4 fL (ref 79.5–101.0)
MONO#: 0.4 10*3/uL (ref 0.1–0.9)
MONO%: 7.1 % (ref 0.0–14.0)
NEUT#: 4.1 10*3/uL (ref 1.5–6.5)
NEUT%: 72.7 % (ref 38.4–76.8)
PLATELETS: 163 10*3/uL (ref 145–400)
RBC: 4.1 10*6/uL (ref 3.70–5.45)
RDW: 13.9 % (ref 11.2–14.5)
WBC: 5.7 10*3/uL (ref 3.9–10.3)
lymph#: 1.1 10*3/uL (ref 0.9–3.3)

## 2016-02-06 LAB — GLUCOSE, CAPILLARY: Glucose-Capillary: 195 mg/dL — ABNORMAL HIGH (ref 65–99)

## 2016-02-06 MED ORDER — FLUDEOXYGLUCOSE F - 18 (FDG) INJECTION: 7.7000 | Freq: Once | INTRAVENOUS | Status: DC | PRN

## 2016-02-09 ENCOUNTER — Ambulatory Visit (HOSPITAL_BASED_OUTPATIENT_CLINIC_OR_DEPARTMENT_OTHER): Payer: Medicare Other | Admitting: Hematology and Oncology

## 2016-02-09 ENCOUNTER — Encounter: Payer: Self-pay | Admitting: Hematology and Oncology

## 2016-02-09 VITALS — BP 166/96 | HR 90 | Temp 98.2°F | Resp 18 | Ht 64.0 in | Wt 160.1 lb

## 2016-02-09 DIAGNOSIS — K521 Toxic gastroenteritis and colitis: Secondary | ICD-10-CM

## 2016-02-09 DIAGNOSIS — I10 Essential (primary) hypertension: Secondary | ICD-10-CM | POA: Diagnosis not present

## 2016-02-09 DIAGNOSIS — D630 Anemia in neoplastic disease: Secondary | ICD-10-CM

## 2016-02-09 DIAGNOSIS — C8580 Other specified types of non-Hodgkin lymphoma, unspecified site: Secondary | ICD-10-CM

## 2016-02-09 DIAGNOSIS — C8588 Other specified types of non-Hodgkin lymphoma, lymph nodes of multiple sites: Secondary | ICD-10-CM | POA: Diagnosis not present

## 2016-02-09 NOTE — Assessment & Plan Note (Signed)
she will continue current medical management. According to her, she has significant element of white coat hypertension. Her BP monitoring at home show blood pressure is within normal limits I recommend close follow-up with primary care doctor for medication adjustment.

## 2016-02-09 NOTE — Assessment & Plan Note (Signed)
The patient has intermittent, recurrence of anemia, likely due to anemia of chronic disease due to poorly controlled diabetes and mild chronic kidney disease She is not symptomatic. Observe only for now 

## 2016-02-09 NOTE — Assessment & Plan Note (Signed)
She had recent severe diarrhea likely due to chemotherapy. I recommend she continues to take Imodium as needed

## 2016-02-09 NOTE — Progress Notes (Signed)
[facility] OFFICE PROGRESS NOTE  Patient Care Team: Aretta Nip, MD as PCP - General (Family Medicine) Sharyne Peach, MD as Consulting Physician (Ophthalmology) Inda Castle, MD as Consulting Physician (Gastroenterology) Theodis Sato, MD (Inactive) as Consulting Physician (Orthopedic Surgery) Jannette Spanner, MD as Referring Physician (Dermatology) Heath Lark, MD as Consulting Physician (Hematology and Oncology) Alphonsa Overall, MD as Consulting Physician (General Surgery)  SUMMARY OF ONCOLOGIC HISTORY: Oncology History   Marginal zone lymphoma, with lymph node and bone marrow involvement along with splenomegaly   Primary site: Lymphoid Neoplasms   Staging method: AJCC 6th Edition   Clinical: Stage IV signed by Heath Lark, MD on 09/02/2013  7:38 PM   Summary: Stage IV       Marginal zone lymphoma ([place])   07/29/2013 Imaging    CT scan shows splenomegaly and abnormal lymphadenopathy.      08/13/2013 Imaging    PET CT scan confirmed lymphadenopathy and splenomegaly, those areas are PET avid      08/23/2013 Surgery    [id number] left axillary lymph node biopsy confirmed a low-grade lymphoma.      08/27/2013 Bone Marrow Biopsy    Bone marrow biopsy confirmed lymphoma.[id number]      09/08/2013 - 09/29/2013 Chemotherapy    She completed 4 cycles of rituximab with resolution of splenomegaly.      12/29/2013 Imaging    PET scan show complete response.      07/11/2014 Imaging    PET scan showed recurrent disease      07/19/2014 - 08/09/2014 Chemotherapy    She completed 4 cycles of rituximab with resolution of splenomegaly      10/12/2014 Imaging    Repeat PET/CT scan showed near complete response to treatment.      10/13/2014 - 02/09/2015 Chemotherapy    She is placed on rituximab maintenance therapy.      03/29/2015 Imaging    PET CT showed possible mild progression of splenomegaly      04/26/2015 -  Chemotherapy    She started taking Ibrutinib       04/28/2015 Adverse Reaction    Treatment was placed temporarily on hold due to severe diarrhea and resumed at reduced dose      07/03/2015 PET scan    No hypermetabolic adenopathy in the neck, chest, abdomen or pelvis.2. Spleen is not hypermetabolic and appears slightly less prominent size.      02/06/2016 PET scan    Hypermetabolic LEFT axillary lymph nodes. Concern for HIGH-GRADE LYMPHOMA LOCALIZED RECURRENCE. 2. Prominent spleen without hypermetabolic activity. No change in size from comparison. 3. No additional hypermetabolic adenopathy on scan.       INTERVAL HISTORY: Please see below for problem oriented charting. She returns with her son today to review test results She continues to have diarrhea from chemotherapy. She had loss of weight Denies recent lymphadenopathy Denies recent infection She recall injuring her left hand on 01/23/2016 patient her hand got caught on the storm door and bled excessively. She denies local skin infection on her hand, fever or chills  REVIEW OF SYSTEMS:   Constitutional: Denies fevers, chills or abnormal weight loss Eyes: Denies blurriness of vision Ears, nose, mouth, throat, and face: Denies mucositis or sore throat Respiratory: Denies cough, dyspnea or wheezes Cardiovascular: Denies palpitation, chest discomfort or lower extremity swelling Skin: Denies abnormal skin rashes Lymphatics: Denies new lymphadenopathy or easy bruising Neurological:Denies numbness, tingling or new weaknesses Behavioral/Psych: Mood is stable, no new changes  All other systems  were reviewed with the patient and are negative.  I have reviewed the past medical history, past surgical history, social history and family history with the patient and they are unchanged from previous note.  ALLERGIES:  is allergic to aspirin; capoten [captopril]; levemir [insulin detemir]; micronase [glyburide]; onglyza [saxagliptin]; statins; and zocor [simvastatin].  MEDICATIONS:  Current  Outpatient Prescriptions  Medication Sig Dispense Refill  . aspirin 81 MG tablet Take 40.5 mg by mouth 3 (three) times a week.    . Calcium Carbonate-Vit D-Min (CALCIUM 1200 PO) Take 1 tablet by mouth daily.    . cholecalciferol (VITAMIN D) 1000 UNITS tablet Take 1,000 Units by mouth daily.     . Cinnamon 500 MG capsule Take 500 mg by mouth daily.    . enalapril (VASOTEC) 20 MG tablet Take 20 mg by mouth every morning.    . fish oil-omega-3 fatty acids 1000 MG capsule Take 1 g by mouth daily.     Marland Kitchen glimepiride (AMARYL) 4 MG tablet Take 4 mg by mouth daily with breakfast.    . glucose blood test strip Use as instructed 100 each 12  . ibrutinib (IMBRUVICA) 140 MG capsul Take 2 capsules (280 mg total) by mouth daily. 60 capsule 8  . loperamide (IMODIUM) 2 MG capsule Take by mouth as needed for diarrhea or loose stools.    . Magnesium 500 MG CAPS Take 500 mg by mouth daily.     . metFORMIN (GLUCOPHAGE) 1000 MG tablet Take 1,000 mg by mouth 2 (two) times daily with a meal.     . Misc Natural Products (LUTEIN 20 PO) Take by mouth daily.    . Multiple Vitamins-Minerals (CENTRUM CARDIO) TABS Take 1 tablet by mouth daily.    . Multiple Vitamins-Minerals (ICAPS) CAPS Take by mouth daily.    . Red Yeast Rice 600 MG TABS Take 600 mg by mouth daily.      No current facility-administered medications for this visit.    Facility-Administered Medications Ordered in Other Visits  Medication Dose Route Frequency Provider Last Rate Last Dose  . fludeoxyglucose F - 18 (FDG) injection 7.7 millicurie  7.7 millicurie Intravenous Once PRN Sherryl Barters, MD        PHYSICAL EXAMINATION: ECOG PERFORMANCE STATUS: 1 - Symptomatic but completely ambulatory  Vitals:   02/09/16 1432  BP: (!) 166/96  Pulse: 90  Resp: 18  Temp: 98.2 F (36.8 C)   Filed Weights   02/09/16 1432  Weight: 160 lb 1.6 oz (72.6 kg)    GENERAL:alert, no distress and comfortable SKIN: skin color, texture, turgor are normal, no  rashes or significant lesions. Noted minus skin bruising EYES: normal, Conjunctiva are pink and non-injected, sclera clear OROPHARYNX:no exudate, no erythema and lips, buccal mucosa, and tongue normal  NECK: supple, thyroid normal size, non-tender, without nodularity LYMPH:  no palpable lymphadenopathy in the cervical, axillary or inguinal LUNGS: clear to auscultation and percussion with normal breathing effort HEART: regular rate & rhythm and no murmurs and no lower extremity edema ABDOMEN:abdomen soft, non-tender and normal bowel sounds Musculoskeletal:no cyanosis of digits and no clubbing  NEURO: alert & oriented x 3 with fluent speech, no focal motor/sensory deficits  LABORATORY DATA:  I have reviewed the data as listed    Component Value Date/Time   NA 140 02/06/2016 1012   K 4.1 02/06/2016 1012   CL 100 08/27/2013 0710   CO2 26 02/06/2016 1012   GLUCOSE 180 (H) 02/06/2016 1012   BUN 14.8 02/06/2016 1012  CREATININE 1.0 02/06/2016 1012   CALCIUM 9.0 02/06/2016 1012   PROT 6.7 02/06/2016 1012   ALBUMIN 3.6 02/06/2016 1012   AST 17 02/06/2016 1012   ALT 19 02/06/2016 1012   ALKPHOS 83 02/06/2016 1012   BILITOT 0.55 02/06/2016 1012   GFRNONAA 62 (L) 08/27/2013 0710   GFRNONAA 47 (L) 05/18/2013 1256   GFRAA 72 (L) 08/27/2013 0710   GFRAA 55 (L) 05/18/2013 1256    No results found for: SPEP, UPEP  Lab Results  Component Value Date   WBC 5.7 02/06/2016   NEUTROABS 4.1 02/06/2016   HGB 11.3 (L) 02/06/2016   HCT 33.8 (L) 02/06/2016   MCV 82.4 02/06/2016   PLT 163 02/06/2016      Chemistry      Component Value Date/Time   NA 140 02/06/2016 1012   K 4.1 02/06/2016 1012   CL 100 08/27/2013 0710   CO2 26 02/06/2016 1012   BUN 14.8 02/06/2016 1012   CREATININE 1.0 02/06/2016 1012   GLU 202 (H) 08/09/2014 1000      Component Value Date/Time   CALCIUM 9.0 02/06/2016 1012   ALKPHOS 83 02/06/2016 1012   AST 17 02/06/2016 1012   ALT 19 02/06/2016 1012   BILITOT 0.55  02/06/2016 1012       RADIOGRAPHIC STUDIES:I reviewed imaging study with the patient and her son I have personally reviewed the radiological images as listed and agreed with the findings in the report. Nm Pet Image Restag (ps) Skull Base To Thigh  Result Date: 02/06/2016 CLINICAL DATA:  Subsequent treatment strategy for lymphoma. Marginal zone lymphoma with lymph node and bone involvement. Patient status post 2 cycles rituximab and partial therapy with Ibrutinib. EXAM: NUCLEAR MEDICINE PET SKULL BASE TO THIGH TECHNIQUE: 7.7 mCi F-18 FDG was injected intravenously. Full-ring PET imaging was performed from the skull base to thigh after the radiotracer. CT data was obtained and used for attenuation correction and anatomic localization. FASTING BLOOD GLUCOSE:  Value: 195 mg/dl COMPARISON:  PET CT 07/03/2015, PET-CT 03/29/2015 FINDINGS: NECK No hypermetabolic lymph nodes in the neck. CHEST New hypermetabolic LEFT axillary lymph nodes. There is a cluster of approximately 6 lymph nodes which are intensely hypermetabolic for size. For example 13 mm short axis node (image 49, series 4) with SUV max equal 11.8. Similar lymph node measuring 11 mm short axis (image 53, series 4) with SUV max equal 3.9 No hypermetabolic supraclavicular nodes. No hypermetabolic mediastinal nodes. Review of the lung parenchyma demonstrates 4 mm RIGHT upper lobe nodule image 74, series 4. The ABDOMEN/PELVIS No abnormal metabolic activity the spleen. The spleen is prominent measuring 9.4 cm in axial dimension compared to 10 point 7. No hypermetabolic abdominopelvic lymph nodes. The gallbladder is distended to 5 cm not changed. Uterus ovaries are normal. SKELETON No focal hypermetabolic activity to suggest skeletal metastasis. IMPRESSION: 1. Hypermetabolic LEFT axillary lymph nodes. Concern for HIGH-GRADE LYMPHOMA LOCALIZED RECURRENCE. 2. Prominent spleen without hypermetabolic activity. No change in size from comparison. 3. No additional  hypermetabolic adenopathy on scan. Electronically Signed   By: Suzy Bouchard M.D.   On: 02/06/2016 10:16    ASSESSMENT & PLAN:  Marginal zone lymphoma I reviewed the PET scan with the patient and her son The spleen area has improved on PET scan but he is a new cluster of lymphadenopathy in the left axilla region which is new It is concerning for possible cancer relapse Having said that, she had recent injury to her right hand and could have  localized infection that caused reactive lymphadenopathy in the left axilla In any case, I recommend biopsy Ideally follow-up, for lymphoma diagnosis, excisional lymph node biopsy is desirable However, to reduce her risk, I recommend we proceed with ultrasound-guided biopsy first I will see her back with test results  Anemia in neoplastic disease The patient has intermittent, recurrence of anemia, likely due to anemia of chronic disease due to poorly controlled diabetes and mild chronic kidney disease She is not symptomatic. Observe only for now  Diarrhea due to drug She had recent severe diarrhea likely due to chemotherapy. I recommend she continues to take Imodium as needed   Essential hypertension she will continue current medical management. According to her, she has significant element of white coat hypertension. Her BP monitoring at home show blood pressure is within normal limits I recommend close follow-up with primary care doctor for medication adjustment.   Orders Placed This Encounter  Procedures  . US Biopsy    Standing Status:   Future    Standing Expiration Date:   04/08/2017    Order Specific Question:   Lab orders requested (DO NOT place separate lab orders, these will be automatically ordered during procedure specimen collection):    Answer:   Surgical Pathology    Order Specific Question:   Reason for Exam (SYMPTOM  OR DIAGNOSIS REQUIRED)    Answer:   lymphoma, abnromal PET, need core biopsy to establish diagnosis     Order Specific Question:   Preferred imaging location?    Answer:   [facility]   All questions were answered. The patient knows to call the clinic with any problems, questions or concerns. No barriers to learning was detected. I spent 25 minutes counseling the patient face to face. The total time spent in the appointment was 30 minutes and more than 50% was on counseling and review of test results     Heath Lark, MD 02/09/2016 2:53 PM

## 2016-02-09 NOTE — Assessment & Plan Note (Signed)
I reviewed the PET scan with the patient and her son The spleen area has improved on PET scan but he is a new cluster of lymphadenopathy in the left axilla region which is new It is concerning for possible cancer relapse Having said that, she had recent injury to her right hand and could have localized infection that caused reactive lymphadenopathy in the left axilla In any case, I recommend biopsy Ideally follow-up, for lymphoma diagnosis, excisional lymph node biopsy is desirable However, to reduce her risk, I recommend we proceed with ultrasound-guided biopsy first I will see her back with test results

## 2016-02-12 ENCOUNTER — Telehealth: Payer: Self-pay | Admitting: *Deleted

## 2016-02-12 NOTE — Telephone Encounter (Signed)
Informed pt Dr. Alvy Bimler sent request to scheduler to see her on 1/25 to review biopsy results.  Informed pt to expect a call from [place] with appt time for 1/25.  She verbalized understanding.

## 2016-02-12 NOTE — Telephone Encounter (Signed)
LVM for Radiology Scheduler this morning to schedule US guided Biopsy.  I see it is now scheduled for 1/22.

## 2016-02-12 NOTE — Telephone Encounter (Signed)
Since the biopsy is late, I recommend seeing me on Thursday I will place scheduling msg

## 2016-02-15 ENCOUNTER — Other Ambulatory Visit: Payer: Self-pay | Admitting: Radiology

## 2016-02-19 ENCOUNTER — Ambulatory Visit (HOSPITAL_COMMUNITY)
Admission: RE | Admit: 2016-02-19 | Discharge: 2016-02-19 | Disposition: A | Discharge status: Home / Self Care | Payer: Medicare Other | Source: Ambulatory Visit | Attending: Hematology and Oncology | Admitting: Hematology and Oncology

## 2016-02-19 ENCOUNTER — Telehealth: Payer: Self-pay | Admitting: Hematology and Oncology

## 2016-02-19 ENCOUNTER — Encounter (HOSPITAL_COMMUNITY): Payer: Self-pay

## 2016-02-19 DIAGNOSIS — C8580 Other specified types of non-Hodgkin lymphoma, unspecified site: Secondary | ICD-10-CM | POA: Insufficient documentation

## 2016-02-19 DIAGNOSIS — R590 Localized enlarged lymph nodes: Secondary | ICD-10-CM | POA: Insufficient documentation

## 2016-02-19 DIAGNOSIS — E785 Hyperlipidemia, unspecified: Secondary | ICD-10-CM | POA: Diagnosis not present

## 2016-02-19 DIAGNOSIS — E119 Type 2 diabetes mellitus without complications: Secondary | ICD-10-CM | POA: Insufficient documentation

## 2016-02-19 DIAGNOSIS — Z9221 Personal history of antineoplastic chemotherapy: Secondary | ICD-10-CM | POA: Insufficient documentation

## 2016-02-19 DIAGNOSIS — Z8052 Family history of malignant neoplasm of bladder: Secondary | ICD-10-CM | POA: Insufficient documentation

## 2016-02-19 DIAGNOSIS — R634 Abnormal weight loss: Secondary | ICD-10-CM | POA: Diagnosis not present

## 2016-02-19 DIAGNOSIS — Z9889 Other specified postprocedural states: Secondary | ICD-10-CM | POA: Insufficient documentation

## 2016-02-19 DIAGNOSIS — Z888 Allergy status to other drugs, medicaments and biological substances status: Secondary | ICD-10-CM | POA: Diagnosis not present

## 2016-02-19 DIAGNOSIS — Z800 Family history of malignant neoplasm of digestive organs: Secondary | ICD-10-CM | POA: Insufficient documentation

## 2016-02-19 LAB — GLUCOSE, CAPILLARY: Glucose-Capillary: 124 mg/dL — ABNORMAL HIGH (ref 65–99)

## 2016-02-19 LAB — CBC WITH DIFFERENTIAL/PLATELET
Basophils Absolute: 0 10*3/uL (ref 0.0–0.1)
Basophils Relative: 1 %
EOS ABS: 0.1 10*3/uL (ref 0.0–0.7)
EOS PCT: 1 %
HCT: 32.7 % — ABNORMAL LOW (ref 36.0–46.0)
Hemoglobin: 10.9 g/dL — ABNORMAL LOW (ref 12.0–15.0)
LYMPHS ABS: 1.3 10*3/uL (ref 0.7–4.0)
Lymphocytes Relative: 21 %
MCH: 26.8 pg (ref 26.0–34.0)
MCHC: 33.3 g/dL (ref 30.0–36.0)
MCV: 80.5 fL (ref 78.0–100.0)
MONOS PCT: 7 %
Monocytes Absolute: 0.4 10*3/uL (ref 0.1–1.0)
Neutro Abs: 4.4 10*3/uL (ref 1.7–7.7)
Neutrophils Relative %: 70 %
PLATELETS: 191 10*3/uL (ref 150–400)
RBC: 4.06 MIL/uL (ref 3.87–5.11)
RDW: 14 % (ref 11.5–15.5)
WBC: 6.3 10*3/uL (ref 4.0–10.5)

## 2016-02-19 LAB — PROTIME-INR
INR: 0.9
PROTHROMBIN TIME: 12.1 s (ref 11.4–15.2)

## 2016-02-19 LAB — BASIC METABOLIC PANEL
Anion gap: 8 (ref 5–15)
BUN: 15 mg/dL (ref 6–20)
CALCIUM: 9 mg/dL (ref 8.9–10.3)
CHLORIDE: 106 mmol/L (ref 101–111)
CO2: 25 mmol/L (ref 22–32)
CREATININE: 0.88 mg/dL (ref 0.44–1.00)
GFR calc Af Amer: >60 mL/min (ref >60)
GFR calc non Af Amer: >60 mL/min (ref >60)
Glucose, Bld: 136 mg/dL — ABNORMAL HIGH (ref 65–99)
Potassium: 3.9 mmol/L (ref 3.5–5.1)
SODIUM: 139 mmol/L (ref 135–145)

## 2016-02-19 MED ORDER — FENTANYL CITRATE (PF) 100 MCG/2ML IJ SOLN
INTRAMUSCULAR | Status: AC
  Filled 2016-02-19: qty 4

## 2016-02-19 MED ORDER — FENTANYL CITRATE (PF) 100 MCG/2ML IJ SOLN
INTRAMUSCULAR | Status: AC | PRN
  Administered 2016-02-19: 50 ug via INTRAVENOUS

## 2016-02-19 MED ORDER — SODIUM CHLORIDE 0.9 % IV SOLN
INTRAVENOUS | Status: DC
  Administered 2016-02-19: 11:00:00 via INTRAVENOUS

## 2016-02-19 MED ORDER — MIDAZOLAM HCL 2 MG/2ML IJ SOLN
INTRAMUSCULAR | Status: AC | PRN
  Administered 2016-02-19 (×3): 1 mg via INTRAVENOUS

## 2016-02-19 MED ORDER — MIDAZOLAM HCL 2 MG/2ML IJ SOLN
INTRAMUSCULAR | Status: AC
  Filled 2016-02-19: qty 6

## 2016-02-19 NOTE — Telephone Encounter (Signed)
Left message re 1/25 f/u

## 2016-02-19 NOTE — H&P (Signed)
Chief Complaint: history of marginal zone lymphoma  Referring Physician:Dr. Heath Lark  Supervising Physician: Arne Cleveland  Patient Status: [facility] - Out-pt  HPI: [name] is an 83 y.o. female who was diagnosed in 37 with marginal zone lymphoma.  She underwent 18 months of IV chemotherapy.  She has undergone oral chemotherapy for the last 8 months.  She recently had a PET scan that revealed hypermetabolic left axillary LN concerning for high grade lymphoma recurrence.  A request has been made for a biopsy of this lymph node to confirm a recurrence.  Past Medical History:  Past Medical History:  Diagnosis Date  . Anemia   . Diabetes ([place])   . High blood pressure   . Hyperlipidemia   . Marginal zone lymphoma ([place]) 09/02/2013  . Neuropathy ([place])   . Splenomegaly 07/23/2013  . Wears glasses   . Weight loss 07/23/2013    Past Surgical History:  Past Surgical History:  Procedure Laterality Date  . AXILLARY LYMPH NODE BIOPSY Left 08/23/2013   Procedure: LEFT AXILLARY LYMPH NODE BIOPSY;  Surgeon: Shann Medal, MD;  Location: [facility];  Service: General;  Laterality: Left;  . BREAST SURGERY Right 1989   biopsy-benign  . COLONOSCOPY    . ESOPHAGOGASTRODUODENOSCOPY    . POLYPECTOMY  1989  . TONSILLECTOMY      Family History:  Family History  Problem Relation Age of Onset  . Cancer Mother     bladder  . Colon cancer Neg Hx     Social History:  reports that she has never smoked. She has never used smokeless tobacco. She reports that she does not drink alcohol or use drugs.  Allergies:  Allergies  Allergen Reactions  . Aspirin Other (See Comments)    Nose bleeds  . Capoten [Captopril] Other (See Comments)    Mouth numbness   . Levemir [Insulin Detemir] Other (See Comments)    Decreased blood sugar  . Micronase [Glyburide]   . Onglyza [Saxagliptin] Other (See Comments)    Elevates blood sugar  . Statins     Numbness in face  . Zocor  [Simvastatin] Other (See Comments)    Extreme fatigue    Medications: Medications reviewed in epic  Please HPI for pertinent positives, otherwise complete 10 system ROS negative.  Mallampati Score: MD Evaluation Airway: WNL Heart: WNL Abdomen: WNL Chest/ Lungs: WNL ASA  Classification: 2 Mallampati/Airway Score: Two  Physical Exam: Temp (F)   98.2  98.2 (36.8)  01/22 1100  Pulse Rate   83  83  01/22 1100  Resp   16  16  01/22 1100  BP   189/88  189/88  01/22 1100  SpO2 (%)   100  100  01/22 1100   General: pleasant, WD, WN white female who is laying in bed in NAD HEENT: head is normocephalic, atraumatic.  Sclera are noninjected.  PERRL.  Ears and nose without any masses or lesions.  Mouth is pink and moist Heart: regular, rate, and rhythm.  Normal s1,s2. No obvious murmurs, gallops, or rubs noted.  Palpable radial and pedal pulses bilaterally Lungs: CTAB, no wheezes, rhonchi, or rales noted.  Respiratory effort nonlabored Abd: soft, NT, ND, +BS, no masses, hernias, or organomegaly Psych: A&Ox3 with an appropriate affect.    Labs: Results for orders placed or performed during the hospital encounter of 02/19/16 (from the past 48 hour(s))  Glucose, capillary     Status: Abnormal   Collection Time: 02/19/16  11:24 AM  Result Value Ref Range   Glucose-Capillary 124 (H) 65 - 99 mg/dL  PT/INR: 0.90 Plts: 191  Imaging: No results found.  Assessment/Plan 1. History of marginal zone lymphoma, left axillary lymphadenopathy -we will plan to proceed with a lymph node biopsy today of her left axillary LN. -labs and vitals reviewed -Risks and Benefits discussed with the patient including, but not limited to bleeding, infection, damage to adjacent structures or low yield requiring additional tests. All of the patient's questions were answered, patient is agreeable to proceed. Consent signed and in chart.  Thank you for this interesting consult.  I greatly enjoyed meeting  Jamie Mendez and look forward to participating in their care.  A copy of this report was sent to the requesting provider on this date.  Electronically Signed: Henreitta Cea 02/19/2016, 12:46 PM   I spent a total of  30 Minutes in face to face in clinical consultation, greater than 50% of which was counseling/coordinating care for left axillary lymphadenopathy

## 2016-02-19 NOTE — Discharge Instructions (Signed)
Needle Biopsy, Care After °Introduction °These instructions give you information about caring for yourself after your procedure. Your doctor may also give you more specific instructions. Call your doctor if you have any problems or questions after your procedure. °Follow these instructions at home: °· Rest as told by your doctor. °· Take medicines only as told by your doctor. °· There are many different ways to close and cover the biopsy site, including stitches (sutures), skin glue, and adhesive strips. Follow instructions from your doctor about: °¨ How to take care of your biopsy site. °¨ When and how you should change your bandage (dressing). °¨ When you should remove your dressing. °¨ Removing whatever was used to close your biopsy site. °· Check your biopsy site every day for signs of infection. Watch for: °¨ Redness, swelling, or pain. °¨ Fluid, blood, or pus. °Contact a doctor if: °· You have a fever. °· You have redness, swelling, or pain at the biopsy site, and it lasts longer than a few days. °· You have fluid, blood, or pus coming from the biopsy site. °· You feel sick to your stomach (nauseous). °· You throw up (vomit). °Get help right away if: °· You are short of breath. °· You have trouble breathing. °· Your chest hurts. °· You feel dizzy or you pass out (faint). °· You have bleeding that does not stop with pressure or a bandage. °· You cough up blood. °· Your belly (abdomen) hurts. °This information is not intended to replace advice given to you by your health care provider. Make sure you discuss any questions you have with your health care provider. °Document Released: 12/28/2007 Document Revised: 06/22/2015 Document Reviewed: 01/10/2014 °© 2017 Elsevier °Moderate Conscious Sedation, Adult, Care After °These instructions provide you with information about caring for yourself after your procedure. Your health care provider may also give you more specific instructions. Your treatment has been planned  according to current medical practices, but problems sometimes occur. Call your health care provider if you have any problems or questions after your procedure. °What can I expect after the procedure? °After your procedure, it is common: °· To feel sleepy for several hours. °· To feel clumsy and have poor balance for several hours. °· To have poor judgment for several hours. °· To vomit if you eat too soon. °Follow these instructions at home: °For at least 24 hours after the procedure:  °· Do not: °¨ Participate in activities where you could fall or become injured. °¨ Drive. °¨ Use heavy machinery. °¨ Drink alcohol. °¨ Take sleeping pills or medicines that cause drowsiness. °¨ Make important decisions or sign legal documents. °¨ Take care of children on your own. °· Rest. °Eating and drinking °· Follow the diet recommended by your health care provider. °· If you vomit: °¨ Drink water, juice, or soup when you can drink without vomiting. °¨ Make sure you have little or no nausea before eating solid foods. °General instructions °· Have a responsible adult stay with you until you are awake and alert. °· Take over-the-counter and prescription medicines only as told by your health care provider. °· If you smoke, do not smoke without supervision. °· Keep all follow-up visits as told by your health care provider. This is important. °Contact a health care provider if: °· You keep feeling nauseous or you keep vomiting. °· You feel light-headed. °· You develop a rash. °· You have a fever. °Get help right away if: °· You have trouble breathing. °This information   is not intended to replace advice given to you by your health care provider. Make sure you discuss any questions you have with your health care provider. °Document Released: 11/04/2012 Document Revised: 06/19/2015 Document Reviewed: 05/06/2015 °Elsevier Interactive Patient Education © 2017 Elsevier Inc. ° °

## 2016-02-19 NOTE — Telephone Encounter (Signed)
Patient came to scheduling to retrieve calendar with future scheduled appointments.

## 2016-02-19 NOTE — Procedures (Signed)
[place] core L axillary LAN 18g x5 to surg path No complication No blood loss. See complete dictation in [facility].

## 2016-02-22 ENCOUNTER — Telehealth: Payer: Self-pay | Admitting: Hematology and Oncology

## 2016-02-22 ENCOUNTER — Ambulatory Visit (HOSPITAL_BASED_OUTPATIENT_CLINIC_OR_DEPARTMENT_OTHER): Payer: Medicare Other | Admitting: Hematology and Oncology

## 2016-02-22 ENCOUNTER — Encounter: Payer: Self-pay | Admitting: Hematology and Oncology

## 2016-02-22 VITALS — BP 189/73 | HR 86 | Temp 98.4°F | Resp 18 | Ht 64.0 in | Wt 157.3 lb

## 2016-02-22 DIAGNOSIS — C8588 Other specified types of non-Hodgkin lymphoma, lymph nodes of multiple sites: Secondary | ICD-10-CM | POA: Diagnosis not present

## 2016-02-22 DIAGNOSIS — I10 Essential (primary) hypertension: Secondary | ICD-10-CM | POA: Diagnosis not present

## 2016-02-22 DIAGNOSIS — C8580 Other specified types of non-Hodgkin lymphoma, unspecified site: Secondary | ICD-10-CM

## 2016-02-22 DIAGNOSIS — Z7189 Other specified counseling: Secondary | ICD-10-CM | POA: Insufficient documentation

## 2016-02-22 MED ORDER — PROCHLORPERAZINE MALEATE 10 MG PO TABS: 10.0000 mg | ORAL_TABLET | Freq: Four times a day (QID) | ORAL | 1 refills | Status: DC | PRN

## 2016-02-22 MED ORDER — ACYCLOVIR 400 MG PO TABS: 400.0000 mg | ORAL_TABLET | Freq: Every day | ORAL | 5 refills | Status: DC

## 2016-02-22 MED ORDER — PREDNISONE 50 MG PO TABS: 50.0000 mg | ORAL_TABLET | Freq: Every day | ORAL | 0 refills | Status: AC

## 2016-02-22 MED ORDER — ONDANSETRON HCL 8 MG PO TABS: 8.0000 mg | ORAL_TABLET | Freq: Three times a day (TID) | ORAL | 1 refills | Status: DC | PRN

## 2016-02-22 MED ORDER — ALLOPURINOL 300 MG PO TABS: 300.0000 mg | ORAL_TABLET | Freq: Every day | ORAL | 0 refills | Status: DC

## 2016-02-22 NOTE — Assessment & Plan Note (Addendum)
I reviewed the biopsy with the patient. I have discussed the biopsy report extensively with the pathologist. Due to sampling, there was insufficient tissue but overall consistent with lymphoproliferative disorder. I suspect she had nodal marginal zone lymphoma. We discussed the risk and benefit of pursuing excisional lymph node biopsy. The patient declined further biopsy. She is comfortable switching treatment. She will complete her current course of Ibrutinib. We discussed port basements but the patient declined. We reviewed the NCCN guidelines. Treatment goal is palliative  Obinutuzumab plus Bendamustine Followed by Obinutuzumab Maintenance Prolongs Overall Survival Compared with Bendamustine Alone in Patients with Rituximab-Refractory Indolent Non-Hodgkin Lymphoma: Updated Results of the [place] D. Augustin Schooling, Marek Trn?n, Kamal Judye Bos, Rosalita Chessman, Rickard Patience, Pieternella J [place], [facility], Gilles A. Elmer Ramp [place], Federico [place], Grayland Ormond Wassner-Fritsch and Rosanne Gutting  Blood 2016 [id number];   Background: Treatment options for patients (pts) with relapsed or refractory indolent non-Hodgkin lymphoma (iNHL) are limited. GADOLIN [id number]) is an open-label, randomized, Phase 3 trial comparing the efficacy and safety of obinutuzumab (GA101; GAZYVA/GAZYVARO; G) plus bendamustine (B) induction, followed by G maintenance (G-B arm), with B induction (standard of care) in rituximab-refractory iNHL pts. In the primary analysis, which involved all pts enrolled as of September 28, 2012 (n=396; median observation time, 21.0 months [mo]), median Independent Review Committee (IRC)-assessed progression-free survival (PFS; primary endpoint) was longer in the G-B arm (194 pts; median not reached) than in the B arm (202 pts; 14.9 mo), with a 45% reduction in risk of progression or death (HR 0.55; 95% CI 0.40, 0.74; p=0.0001). Investigator (INV)-assessed PFS was also  significantly longer in the G-B arm, but overall survival (OS) data were immature. Safety profiles were comparable. The most common grade ?3 adverse events (AEs) were neutropenia, thrombocytopenia, anemia, and infusion-related reactions (IRRs). Seventeen additional pts were enrolled after the data cut-off for the primary analysis. Here, we report updated time-to-event and safety results from a planned analysis of all pts (n=413) using a data cut-off of April 29, 2014.  Methods: Enrolled pts were aged ?18 years (yrs) with documented rituximab-refractory iNHL and an ECOG performance status of 0-2. Pts received either G 1000mg  i.v. (days [D] 1, 8, and 15 of cycle [C] 1, and D1 of C2-6) plus B 90mg /m2/day i.v. (D1 and 2 of C1-6), or B monotherapy (120mg /m2/day i.v., D1 and 2 of C1-6); each cycle was 28 days. Following induction, pts in the G-B arm without evidence of progression received G maintenance (1000mg  i.v. every 2 mo for 2 yrs or until disease progression, whichever occurred first). In the current analysis, assessments included INV-assessed PFS, OS, time to new anti-lymphoma treatment (TTNT), and safety. Efficacy assessment was performed on the intent-to-treat (ITT) population. Safety analysis included all pts who received any study treatment, excluding 2 pts who crossed over to G-B during maintenance.  Results: Of 413 iNHL pts in the ITT population (G-B, 204; B, 209), 335 (G-B, 164; B, 171) had FL. Baseline characteristics of the ITT population were balanced between arms. Median number of prior regimens was 2 in both arms (pts with ?2 prior regimens: G-B, 80.4%; B, 77.5%). Most pts were refractory to their last regimen (G-B, 92.2%; B, 92.3%). After 31.8 mo median follow-up, median INV-assessed PFS was 25.8 mo in the G-B arm and 14.1 mo in the B arm; HR was 0.57 (95% CI 0.44, 0.73; p<0.0001), i.e. a 43% reduction in risk of progression or death for G-B relative to B. Fewer pts died in the G-B arm (  25.5%) than  the B arm (34.9%), with a HR for OS of 0.67 (95% CI 0.47, 0.96; p=0.0269; risk reduction, 33%; Figure 1A); median OS was not reached for either arm. Results for FL pts were consistent with those for ITT pts (median PFS: 25.3 vs. 14.0 mo [HR 0.52; 95% CI 0.39, 0.69; p<0.0001]; median OS: not reached vs. 53.9 mo [HR 0.58; 95% CI 0.39, 0.86; p=0.0061; Figure 1B]). Median TTNT was also longer in the G-B arm than in the B arm (ITT pts: 40.8 vs. 19.4 mo, respectively [HR 0.59; 95% CI 0.45, 0.77]; FL pts: 33.6 vs. 18.0 mo, respectively [HR 0.57; 95% CI 0.43, 0.75]). The overall safety profile of G-B remained consistent with results of the primary analysis. In the ITT population, there were more grade ?3 AEs with G-B than with B (72.5% vs. 65.5%, respectively), notably neutropenia (34.8% vs. 27.1%) and IRRs (9.3% vs. 3.5%); grade ?3 thrombocytopenia (10.8% vs. 15.8%) and anemia (7.4% vs. 10.8%) were less frequent in the G-B arm, while grade ?3 infections (22.5 % vs. 19.2%) and secondary malignancies (5.9% vs. 5.4%) were reported with a similar incidence. Serious AEs were more frequent in the G-B arm (43.6% vs. 36.9%), but the incidence of grade 5 (fatal) AEs was similar (7.8% vs. 6.4%). Safety results in FL pts were comparable with those in all iNHL pts.  Conclusions: Updated analysis of the [place] study with ~10 mo additional follow-up confirms the previously reported PFS benefit of G-B over B in pts with rituximab-refractory iNHL, and demonstrates a significant improvement in OS in the G-B arm. No new safety signals were detected  We discussed some of the risks, benefits and side-effects of Gazyva with Bendamustine.   Some of the short term side-effects included, though not limited to, risk of fatigue, weight loss, tumor lysis syndrome, risk of allergic reactions, pancytopenia, life-threatening infections, need for transfusions of blood products, nausea, vomiting, change in bowel habits, admission to hospital for  various reasons, and risks of death.   Long term side-effects are also discussed including permanent damage to nerve function, chronic fatigue, and rare secondary malignancy including bone marrow disorders.   The patient is aware that the response rates discussed earlier is not guaranteed.    After a long discussion, patient made an informed decision to proceed with the prescribed plan of care.   Patient education material was dispensed I recommend premedication with prednisone 50 mg daily for 3 days before this dose treatment. I recommend acyclovir for antimicrobial prophylaxis and allopurinol to prevent gout. Due to scheduling issue, we plan to start treatment in 2 weeks. She will return 2 days before treatment for blood work.

## 2016-02-22 NOTE — Telephone Encounter (Signed)
Appointments scheduled per 1/25 LOS. Patient given AVS report and calendars with future scheduled appointments. °

## 2016-02-22 NOTE — Progress Notes (Signed)
[place] OFFICE PROGRESS NOTE  Patient Care Team: Aretta Nip, MD as PCP - General (Family Medicine) Sharyne Peach, MD as Consulting Physician (Ophthalmology) Inda Castle, MD as Consulting Physician (Gastroenterology) Theodis Sato, MD (Inactive) as Consulting Physician (Orthopedic Surgery) Jannette Spanner, MD as Referring Physician (Dermatology) Heath Lark, MD as Consulting Physician (Hematology and Oncology) Alphonsa Overall, MD as Consulting Physician (General Surgery)  SUMMARY OF ONCOLOGIC HISTORY: Oncology History   Marginal zone lymphoma, with lymph node and bone marrow involvement along with splenomegaly   Primary site: Lymphoid Neoplasms   Staging method: AJCC 6th Edition   Clinical: Stage IV signed by Heath Lark, MD on 09/02/2013  7:38 PM   Summary: Stage IV       Marginal zone lymphoma ([place])   07/29/2013 Imaging    CT scan shows splenomegaly and abnormal lymphadenopathy.      08/13/2013 Imaging    PET CT scan confirmed lymphadenopathy and splenomegaly, those areas are PET avid      08/23/2013 Surgery    [id number] left axillary lymph node biopsy confirmed a low-grade lymphoma.      08/27/2013 Bone Marrow Biopsy    Bone marrow biopsy confirmed lymphoma.[id number]      09/08/2013 - 09/29/2013 Chemotherapy    She completed 4 cycles of rituximab with resolution of splenomegaly.      12/29/2013 Imaging    PET scan show complete response.      07/11/2014 Imaging    PET scan showed recurrent disease      07/19/2014 - 08/09/2014 Chemotherapy    She completed 4 cycles of rituximab with resolution of splenomegaly      10/12/2014 Imaging    Repeat PET/CT scan showed near complete response to treatment.      10/13/2014 - 02/09/2015 Chemotherapy    She is placed on rituximab maintenance therapy.      03/29/2015 Imaging    PET CT showed possible mild progression of splenomegaly      04/26/2015 - 02/22/2016 Chemotherapy    She started taking  Ibrutinib      04/28/2015 Adverse Reaction    Treatment was placed temporarily on hold due to severe diarrhea and resumed at reduced dose      07/03/2015 PET scan    No hypermetabolic adenopathy in the neck, chest, abdomen or pelvis.2. Spleen is not hypermetabolic and appears slightly less prominent size.      02/06/2016 PET scan    Hypermetabolic LEFT axillary lymph nodes. Concern for HIGH-GRADE LYMPHOMA LOCALIZED RECURRENCE. 2. Prominent spleen without hypermetabolic activity. No change in size from comparison. 3. No additional hypermetabolic adenopathy on scan.      02/19/2016 Procedure    Technically successful ultrasound-guided core biopsy, left axillary adenopathy.      02/19/2016 Pathology Results    Lymph node, needle/core biopsy, left axilla - ATYPICAL LYMPHOID PROLIFERATION SUSPICIOUS FOR LYMPHOMA. - SEE COMMENT. Microscopic Comment The sections show multiple very small and skinny fragments of lymph nodal tissue displaying areas of lymphoid expansion by a population of small to medium size lymphocytes with round to irregular nuclei, scanty to moderately abundant cytoplasm, dense chromatin and inconspicuous nucleoli. This is associated with scattering of "naked" germinal centers with abundant tingible body macrophages. To further evaluate this process, flow cytometric analysis was performed and shows a minor population of monoclonal B cells expressing pan B-cell antigens including CD20 with associated kappa light chain restriction. In addition, immunohistochemical stains were performed including CD20, CD79a, CD3, CD5, CD10, BCL-2, CD21  with appropriate controls. The stains show a mixture of T and B cells with slight predominance of B cells. CD21 highlights numerous areas with dendritic networks, correlating with the presence of germinal centers. CD10 shows focal positivity likely in germinal centers. BCL-2 is diffusely positive except in areas of apparent germinal centers. The overall  features are atypical and suspicious for involvement by a B-cell lymphoproliferative process, particularly given the B-cell monoclonality by flow cytometry. However, morphologic evaluation is extremely limited due to small biopsy fragments and additional material (excisional biopsy) is strongly recommended for further evaluation. (BNS:ecj 02/21/2016)        INTERVAL HISTORY: Please see below for problem oriented charting. She returns to review test results. She feels well. No complication from recent biopsy.  REVIEW OF SYSTEMS:   Constitutional: Denies fevers, chills or abnormal weight loss Eyes: Denies blurriness of vision Ears, nose, mouth, throat, and face: Denies mucositis or sore throat Respiratory: Denies cough, dyspnea or wheezes Cardiovascular: Denies palpitation, chest discomfort or lower extremity swelling Gastrointestinal:  Denies nausea, heartburn or change in bowel habits Skin: Denies abnormal skin rashes Lymphatics: Denies new lymphadenopathy or easy bruising Neurological:Denies numbness, tingling or new weaknesses Behavioral/Psych: Mood is stable, no new changes  All other systems were reviewed with the patient and are negative.  I have reviewed the past medical history, past surgical history, social history and family history with the patient and they are unchanged from previous note.  ALLERGIES:  is allergic to aspirin; capoten [captopril]; levemir [insulin detemir]; micronase [glyburide]; onglyza [saxagliptin]; statins; and zocor [simvastatin].  MEDICATIONS:  Current Outpatient Prescriptions  Medication Sig Dispense Refill  . aspirin 81 MG tablet Take 40.5 mg by mouth 3 (three) times a week.    . Calcium Carbonate-Vit D-Min (CALCIUM 1200 PO) Take 1 tablet by mouth daily.    . cholecalciferol (VITAMIN D) 1000 UNITS tablet Take 1,000 Units by mouth daily.     . Cinnamon 500 MG capsule Take 500 mg by mouth daily.    . enalapril (VASOTEC) 20 MG tablet Take 20 mg by  mouth every morning.    . fish oil-omega-3 fatty acids 1000 MG capsule Take 1 g by mouth daily.     Marland Kitchen glimepiride (AMARYL) 4 MG tablet Take 4 mg by mouth daily with breakfast.    . glucose blood test strip Use as instructed 100 each 12  . ibrutinib (IMBRUVICA) 140 MG capsul Take 2 capsules (280 mg total) by mouth daily. 60 capsule 8  . loperamide (IMODIUM) 2 MG capsule Take by mouth as needed for diarrhea or loose stools.    . Magnesium 500 MG CAPS Take 500 mg by mouth daily.     . metFORMIN (GLUCOPHAGE) 1000 MG tablet Take 1,000 mg by mouth 2 (two) times daily with a meal.     . Misc Natural Products (LUTEIN 20 PO) Take by mouth daily.    . Multiple Vitamins-Minerals (CENTRUM CARDIO) TABS Take 1 tablet by mouth daily.    . Multiple Vitamins-Minerals (ICAPS) CAPS Take by mouth daily.    . Red Yeast Rice 600 MG TABS Take 600 mg by mouth daily.     Marland Kitchen acyclovir (ZOVIRAX) 400 MG tablet Take 1 tablet (400 mg total) by mouth daily. 30 tablet 5  . allopurinol (ZYLOPRIM) 300 MG tablet Take 1 tablet (300 mg total) by mouth daily. 30 tablet 0  . ondansetron (ZOFRAN) 8 MG tablet Take 1 tablet (8 mg total) by mouth every 8 (eight) hours as needed for  nausea. 30 tablet 1  . [START ON 03/04/2016] predniSONE (DELTASONE) 50 MG tablet Take 1 tablet (50 mg total) by mouth daily with breakfast. 3 tablet 0  . prochlorperazine (COMPAZINE) 10 MG tablet Take 1 tablet (10 mg total) by mouth every 6 (six) hours as needed (Nausea or vomiting). 30 tablet 1   No current facility-administered medications for this visit.     PHYSICAL EXAMINATION: ECOG PERFORMANCE STATUS: 1 - Symptomatic but completely ambulatory  Vitals:   02/22/16 1305 02/22/16 1306  BP: (!) 211/73 (!) 189/73  Pulse: 93 86  Resp: 18   Temp: 98.4 F (36.9 C)    Filed Weights   02/22/16 1305  Weight: 157 lb 4.8 oz (71.4 kg)    GENERAL:alert, no distress and comfortable SKIN: skin color, texture, turgor are normal, no rashes or significant  lesions. Noted minus skin bruising at biopsy site EYES: normal, Conjunctiva are pink and non-injected, sclera clear LYMPH: She has palpable lymphadenopathy in the left axilla region Musculoskeletal:no cyanosis of digits and no clubbing  NEURO: alert & oriented x 3 with fluent speech, no focal motor/sensory deficits  LABORATORY DATA:  I have reviewed the data as listed    Component Value Date/Time   NA 139 02/19/2016 1120   NA 140 02/06/2016 1012   K 3.9 02/19/2016 1120   K 4.1 02/06/2016 1012   CL 106 02/19/2016 1120   CO2 25 02/19/2016 1120   CO2 26 02/06/2016 1012   GLUCOSE 136 (H) 02/19/2016 1120   GLUCOSE 180 (H) 02/06/2016 1012   BUN 15 02/19/2016 1120   BUN 14.8 02/06/2016 1012   CREATININE 0.88 02/19/2016 1120   CREATININE 1.0 02/06/2016 1012   CALCIUM 9.0 02/19/2016 1120   CALCIUM 9.0 02/06/2016 1012   PROT 6.7 02/06/2016 1012   ALBUMIN 3.6 02/06/2016 1012   AST 17 02/06/2016 1012   ALT 19 02/06/2016 1012   ALKPHOS 83 02/06/2016 1012   BILITOT 0.55 02/06/2016 1012   GFRNONAA >60 02/19/2016 1120   GFRNONAA 47 (L) 05/18/2013 1256   GFRAA >60 02/19/2016 1120   GFRAA 55 (L) 05/18/2013 1256    No results found for: SPEP, UPEP  Lab Results  Component Value Date   WBC 6.3 02/19/2016   NEUTROABS 4.4 02/19/2016   HGB 10.9 (L) 02/19/2016   HCT 32.7 (L) 02/19/2016   MCV 80.5 02/19/2016   PLT 191 02/19/2016      Chemistry      Component Value Date/Time   NA 139 02/19/2016 1120   NA 140 02/06/2016 1012   K 3.9 02/19/2016 1120   K 4.1 02/06/2016 1012   CL 106 02/19/2016 1120   CO2 25 02/19/2016 1120   CO2 26 02/06/2016 1012   BUN 15 02/19/2016 1120   BUN 14.8 02/06/2016 1012   CREATININE 0.88 02/19/2016 1120   CREATININE 1.0 02/06/2016 1012   GLU 202 (H) 08/09/2014 1000      Component Value Date/Time   CALCIUM 9.0 02/19/2016 1120   CALCIUM 9.0 02/06/2016 1012   ALKPHOS 83 02/06/2016 1012   AST 17 02/06/2016 1012   ALT 19 02/06/2016 1012   BILITOT 0.55  02/06/2016 1012       RADIOGRAPHIC STUDIES: I have personally reviewed the radiological images as listed and agreed with the findings in the report. Nm Pet Image Restag (ps) Skull Base To Thigh  Result Date: 02/06/2016 CLINICAL DATA:  Subsequent treatment strategy for lymphoma. Marginal zone lymphoma with lymph node and bone involvement. Patient status  post 2 cycles rituximab and partial therapy with Ibrutinib. EXAM: NUCLEAR MEDICINE PET SKULL BASE TO THIGH TECHNIQUE: 7.7 mCi F-18 FDG was injected intravenously. Full-ring PET imaging was performed from the skull base to thigh after the radiotracer. CT data was obtained and used for attenuation correction and anatomic localization. FASTING BLOOD GLUCOSE:  Value: 195 mg/dl COMPARISON:  PET CT 07/03/2015, PET-CT 03/29/2015 FINDINGS: NECK No hypermetabolic lymph nodes in the neck. CHEST New hypermetabolic LEFT axillary lymph nodes. There is a cluster of approximately 6 lymph nodes which are intensely hypermetabolic for size. For example 13 mm short axis node (image 49, series 4) with SUV max equal 11.8. Similar lymph node measuring 11 mm short axis (image 53, series 4) with SUV max equal 3.9 No hypermetabolic supraclavicular nodes. No hypermetabolic mediastinal nodes. Review of the lung parenchyma demonstrates 4 mm RIGHT upper lobe nodule image 74, series 4. The ABDOMEN/PELVIS No abnormal metabolic activity the spleen. The spleen is prominent measuring 9.4 cm in axial dimension compared to 10 point 7. No hypermetabolic abdominopelvic lymph nodes. The gallbladder is distended to 5 cm not changed. Uterus ovaries are normal. SKELETON No focal hypermetabolic activity to suggest skeletal metastasis. IMPRESSION: 1. Hypermetabolic LEFT axillary lymph nodes. Concern for HIGH-GRADE LYMPHOMA LOCALIZED RECURRENCE. 2. Prominent spleen without hypermetabolic activity. No change in size from comparison. 3. No additional hypermetabolic adenopathy on scan. Electronically Signed    By: Suzy Bouchard M.D.   On: 02/06/2016 10:16   US Biopsy  Result Date: 02/19/2016 CLINICAL DATA:  History of marginal zone lymphoma. New hypermetabolic left axillary adenopathy on PET-CT. EXAM: ULTRASOUND GUIDED CORE BIOPSY OF LEFT AXILLARY ADENOPATHY MEDICATIONS: Intravenous Fentanyl and Versed were administered as conscious sedation during continuous monitoring of the patient's level of consciousness and physiological / cardiorespiratory status by the radiology RN, with a total moderate sedation time of 10 minutes. PROCEDURE: The procedure, risks, benefits, and alternatives were explained to the patient. Questions regarding the procedure were encouraged and answered. The patient understands and consents to the procedure. Survey ultrasound of the left axilla was performed. A adenopathy was localized and an appropriate skin entry site determined and marked. The operative field was prepped with chlorhexidine in a sterile fashion, and a sterile drape was applied covering the operative field. A sterile gown and sterile gloves were used for the procedure. Local anesthesia was provided with 1% Lidocaine. Under real-time ultrasound guidance, a 17 gauge trocar needle was advanced to the margin of the lesion. Once needle tip position was confirmed, coaxial 18-gauge core biopsy samples were obtained, submitted in saline to surgical pathology. The guide needle was removed. Postprocedure scans show no hemorrhage or other apparent complication. The patient tolerated the procedure well. COMPLICATIONS: None. FINDINGS: Left axillary adenopathy was localized. Representative core biopsy sampling obtained as above under ultrasound guidance IMPRESSION: 1. Technically successful ultrasound-guided core biopsy, left axillary adenopathy. Electronically Signed   By: Lucrezia Europe M.D.   On: 02/19/2016 14:05    ASSESSMENT & PLAN:  Marginal zone lymphoma I reviewed the biopsy with the patient. I have discussed the biopsy report  extensively with the pathologist. Due to sampling, there was insufficient tissue but overall consistent with lymphoproliferative disorder. I suspect she had nodal marginal zone lymphoma. We discussed the risk and benefit of pursuing excisional lymph node biopsy. The patient declined further biopsy. She is comfortable switching treatment. She will complete her current course of Ibrutinib. We discussed port basements but the patient declined. We reviewed the NCCN guidelines. Treatment goal is  palliative  Obinutuzumab plus Bendamustine Followed by Obinutuzumab Maintenance Prolongs Overall Survival Compared with Bendamustine Alone in Patients with Rituximab-Refractory Indolent Non-Hodgkin Lymphoma: Updated Results of the [place] D. Augustin Schooling, Marek Trn?n, Kamal Judye Bos, Rosalita Chessman, Rickard Patience, Pieternella J [place], [facility], Gilles A. Elmer Ramp Fingerle-Rowson, Federico Thendara, Grayland Ormond Wassner-Fritsch and Rosanne Gutting  Blood 2016 [id number];   Background: Treatment options for patients (pts) with relapsed or refractory indolent non-Hodgkin lymphoma (iNHL) are limited. GADOLIN ([id number]) is an open-label, randomized, Phase 3 trial comparing the efficacy and safety of obinutuzumab (GA101; GAZYVA/GAZYVARO; G) plus bendamustine (B) induction, followed by G maintenance (G-B arm), with B induction (standard of care) in rituximab-refractory iNHL pts. In the primary analysis, which involved all pts enrolled as of September 28, 2012 (n=396; median observation time, 21.0 months [mo]), median Independent Review Committee (IRC)-assessed progression-free survival (PFS; primary endpoint) was longer in the G-B arm (194 pts; median not reached) than in the B arm (202 pts; 14.9 mo), with a 45% reduction in risk of progression or death (HR 0.55; 95% CI 0.40, 0.74; p=0.0001). Investigator (INV)-assessed PFS was also significantly longer in the G-B arm, but overall survival (OS) data were  immature. Safety profiles were comparable. The most common grade ?3 adverse events (AEs) were neutropenia, thrombocytopenia, anemia, and infusion-related reactions (IRRs). Seventeen additional pts were enrolled after the data cut-off for the primary analysis. Here, we report updated time-to-event and safety results from a planned analysis of all pts (n=413) using a data cut-off of April 29, 2014.  Methods: Enrolled pts were aged ?18 years (yrs) with documented rituximab-refractory iNHL and an ECOG performance status of 0-2. Pts received either G 1052m i.v. (days [D] 1, 8, and 15 of cycle [C] 1, and D1 of C2-6) plus B 917mm2/day i.v. (D1 and 2 of C1-6), or B monotherapy (12075m2/day i.v., D1 and 2 of C1-6); each cycle was 28 days. Following induction, pts in the G-B arm without evidence of progression received G maintenance (1000m37mv. every 2 mo for 2 yrs or until disease progression, whichever occurred first). In the current analysis, assessments included INV-assessed PFS, OS, time to new anti-lymphoma treatment (TTNT), and safety. Efficacy assessment was performed on the intent-to-treat (ITT) population. Safety analysis included all pts who received any study treatment, excluding 2 pts who crossed over to G-B during maintenance.  Results: Of 413 iNHL pts in the ITT population (G-B, 204; B, 209), 335 (G-B, 164; B, 171) had FL. Baseline characteristics of the ITT population were balanced between arms. Median number of prior regimens was 2 in both arms (pts with ?2 prior regimens: G-B, 80.4%; B, 77.5%). Most pts were refractory to their last regimen (G-B, 92.2%; B, 92.3%). After 31.8 mo median follow-up, median INV-assessed PFS was 25.8 mo in the G-B arm and 14.1 mo in the B arm; HR was 0.57 (95% CI 0.44, 0.73; p<0.0001), i.e. a 43% reduction in risk of progression or death for G-B relative to B. Fewer pts died in the G-B arm (25.5%) than the B arm (34.9%), with a HR for OS of 0.67 (95% CI 0.47, 0.96;  p=0.0269; risk reduction, 33%; Figure 1A); median OS was not reached for either arm. Results for FL pts were consistent with those for ITT pts (median PFS: 25.3 vs. 14.0 mo [HR 0.52; 95% CI 0.39, 0.69; p<0.0001]; median OS: not reached vs. 53.9 mo [HR 0.58; 95% CI 0.39, 0.86; p=0.0061; Figure 1B]). Median TTNT was also longer in the G-B arm than in the  B arm (ITT pts: 40.8 vs. 19.4 mo, respectively [HR 0.59; 95% CI 0.45, 0.77]; FL pts: 33.6 vs. 18.0 mo, respectively [HR 0.57; 95% CI 0.43, 0.75]). The overall safety profile of G-B remained consistent with results of the primary analysis. In the ITT population, there were more grade ?3 AEs with G-B than with B (72.5% vs. 65.5%, respectively), notably neutropenia (34.8% vs. 27.1%) and IRRs (9.3% vs. 3.5%); grade ?3 thrombocytopenia (10.8% vs. 15.8%) and anemia (7.4% vs. 10.8%) were less frequent in the G-B arm, while grade ?3 infections (22.5 % vs. 19.2%) and secondary malignancies (5.9% vs. 5.4%) were reported with a similar incidence. Serious AEs were more frequent in the G-B arm (43.6% vs. 36.9%), but the incidence of grade 5 (fatal) AEs was similar (7.8% vs. 6.4%). Safety results in FL pts were comparable with those in all iNHL pts.  Conclusions: Updated analysis of the [place] study with ~10 mo additional follow-up confirms the previously reported PFS benefit of G-B over B in pts with rituximab-refractory iNHL, and demonstrates a significant improvement in OS in the G-B arm. No new safety signals were detected  We discussed some of the risks, benefits and side-effects of Gazyva with Bendamustine.   Some of the short term side-effects included, though not limited to, risk of fatigue, weight loss, tumor lysis syndrome, risk of allergic reactions, pancytopenia, life-threatening infections, need for transfusions of blood products, nausea, vomiting, change in bowel habits, admission to hospital for various reasons, and risks of death.   Long term side-effects  are also discussed including permanent damage to nerve function, chronic fatigue, and rare secondary malignancy including bone marrow disorders.   The patient is aware that the response rates discussed earlier is not guaranteed.    After a long discussion, patient made an informed decision to proceed with the prescribed plan of care.   Patient education material was dispensed I recommend premedication with prednisone 50 mg daily for 3 days before this dose treatment. I recommend acyclovir for antimicrobial prophylaxis and allopurinol to prevent gout. Due to scheduling issue, we plan to start treatment in 2 weeks. She will return 2 days before treatment for blood work.   Essential hypertension she will continue current medical management. According to her, she has significant element of white coat hypertension. Her BP monitoring at home show blood pressure is within normal limits I recommend close follow-up with primary care doctor for medication adjustment.  Goals of care, counseling/discussion The patient is aware she has incurable disease and treatment is strictly palliative. We discussed importance of Advanced Directives and Living will.    Orders Placed This Encounter  Procedures  . CBC with Differential    Standing Status:   Standing    Number of Occurrences:   20    Standing Expiration Date:   02/22/2017  . Comprehensive metabolic panel    Standing Status:   Standing    Number of Occurrences:   20    Standing Expiration Date:   02/22/2017  . Hepatitis B surface antigen    Standing Status:   Future    Standing Expiration Date:   03/28/2017  . Hepatitis B surface antibody    Standing Status:   Future    Standing Expiration Date:   03/28/2017  . Hepatitis B core antibody, IgM    Standing Status:   Future    Standing Expiration Date:   03/28/2017  . Lactate dehydrogenase (LDH)    Standing Status:   Future    Standing  Expiration Date:   03/28/2017  . Uric acid    Standing  Status:   Future    Standing Expiration Date:   03/28/2017   All questions were answered. The patient knows to call the clinic with any problems, questions or concerns. No barriers to learning was detected. I spent 50 minutes counseling the patient face to face. The total time spent in the appointment was 60 minutes and more than 50% was on counseling and review of test results     Heath Lark, MD 02/22/2016 4:52 PM

## 2016-02-22 NOTE — Assessment & Plan Note (Signed)
The patient is aware she has incurable disease and treatment is strictly palliative. We discussed importance of Advanced Directives and Living will. 

## 2016-02-22 NOTE — Progress Notes (Signed)
START OFF PATHWAY REGIMEN - Lymphoma and CLL  Off Pathway: Bendamustine 120 mg/m2 IV Days 1 and 2, q28 Days  [id number]:Bendamustine 120 mg/m2 IV Days 1 and 2, q28 Days:   A cycle is every 28 days:     Bendamustine (Bendeka(TM)) 120 mg/m2 in 50 mL NS IV over 10 minutes on days 1 and 2 every 28 days Dose Mod: None     Pegfilgrastim (Neulasta(R)) 6 mg flat dose administer on day 3 Dose Mod: None Additional Orders: Bendamustine use not recommended in patients with CrCl < 40 mL/min or with moderate (AST/ALT 2.5-10 xULN and total bili 1.5-3 xULN) or severe (total bili > 3 xULN) hepatic impairment. Labs to monitor: CBC with differential & CMP prior  to treatment on day 1 of each cycle NOTE: Consider empiric dose reduction for prior transplant.  **Always confirm dose/schedule in your pharmacy ordering system**    Patient Characteristics: Marginal Zone, Localized, Other Disease Type: Marginal Zone Disease Type: Not Applicable Localized or Systemic Disease? Localized [place] Stage: IB Localized Disease Type: Other  Intent of Therapy: Non-Curative / Palliative Intent, Discussed with Patient

## 2016-02-22 NOTE — Progress Notes (Signed)
Patient on plan of care prior to pathways. 

## 2016-02-22 NOTE — Assessment & Plan Note (Signed)
she will continue current medical management. According to her, she has significant element of white coat hypertension. Her BP monitoring at home show blood pressure is within normal limits I recommend close follow-up with primary care doctor for medication adjustment.

## 2016-02-28 ENCOUNTER — Telehealth: Payer: Self-pay

## 2016-02-28 NOTE — Telephone Encounter (Signed)
Patient called and left message that she would like to get a porta cath.

## 2016-02-29 ENCOUNTER — Other Ambulatory Visit: Payer: Self-pay | Admitting: Hematology and Oncology

## 2016-02-29 DIAGNOSIS — C8580 Other specified types of non-Hodgkin lymphoma, unspecified site: Secondary | ICD-10-CM

## 2016-02-29 NOTE — Telephone Encounter (Signed)
I placed order Hopefully gets done before chemo starts I asked for [facility], if they cannot accommodate, please check with [facility]

## 2016-03-01 ENCOUNTER — Other Ambulatory Visit: Payer: Self-pay | Admitting: Physician Assistant

## 2016-03-04 ENCOUNTER — Ambulatory Visit (HOSPITAL_COMMUNITY)
Admission: RE | Admit: 2016-03-04 | Discharge: 2016-03-04 | Disposition: A | Discharge status: Home / Self Care | Payer: Medicare Other | Source: Ambulatory Visit | Attending: Hematology and Oncology | Admitting: Hematology and Oncology

## 2016-03-04 ENCOUNTER — Other Ambulatory Visit: Payer: Self-pay | Admitting: Hematology and Oncology

## 2016-03-04 ENCOUNTER — Encounter (HOSPITAL_COMMUNITY): Payer: Self-pay

## 2016-03-04 DIAGNOSIS — C8280 Other types of follicular lymphoma, unspecified site: Secondary | ICD-10-CM | POA: Diagnosis not present

## 2016-03-04 DIAGNOSIS — E1140 Type 2 diabetes mellitus with diabetic neuropathy, unspecified: Secondary | ICD-10-CM | POA: Insufficient documentation

## 2016-03-04 DIAGNOSIS — E785 Hyperlipidemia, unspecified: Secondary | ICD-10-CM | POA: Insufficient documentation

## 2016-03-04 DIAGNOSIS — D630 Anemia in neoplastic disease: Secondary | ICD-10-CM | POA: Diagnosis not present

## 2016-03-04 DIAGNOSIS — C8580 Other specified types of non-Hodgkin lymphoma, unspecified site: Secondary | ICD-10-CM

## 2016-03-04 HISTORY — PX: IR GENERIC HISTORICAL: IMG1180011

## 2016-03-04 LAB — CBC
HCT: 31.2 % — ABNORMAL LOW (ref 36.0–46.0)
Hemoglobin: 10.2 g/dL — ABNORMAL LOW (ref 12.0–15.0)
MCH: 26.4 pg (ref 26.0–34.0)
MCHC: 32.7 g/dL (ref 30.0–36.0)
MCV: 80.6 fL (ref 78.0–100.0)
PLATELETS: 161 10*3/uL (ref 150–400)
RBC: 3.87 MIL/uL (ref 3.87–5.11)
RDW: 14.2 % (ref 11.5–15.5)
WBC: 4.8 10*3/uL (ref 4.0–10.5)

## 2016-03-04 LAB — GLUCOSE, CAPILLARY: Glucose-Capillary: 184 mg/dL — ABNORMAL HIGH (ref 65–99)

## 2016-03-04 LAB — PROTIME-INR
INR: 0.94
PROTHROMBIN TIME: 12.5 s (ref 11.4–15.2)

## 2016-03-04 LAB — APTT: aPTT: 32 seconds (ref 24–36)

## 2016-03-04 MED ORDER — NALOXONE HCL 0.4 MG/ML IJ SOLN
INTRAMUSCULAR | Status: AC
  Filled 2016-03-04: qty 1

## 2016-03-04 MED ORDER — FENTANYL CITRATE (PF) 100 MCG/2ML IJ SOLN
INTRAMUSCULAR | Status: AC | PRN
  Administered 2016-03-04: 50 ug via INTRAVENOUS
  Administered 2016-03-04 (×2): 25 ug via INTRAVENOUS

## 2016-03-04 MED ORDER — HEPARIN SOD (PORK) LOCK FLUSH 100 UNIT/ML IV SOLN
INTRAVENOUS | Status: AC
  Filled 2016-03-04: qty 5

## 2016-03-04 MED ORDER — FENTANYL CITRATE (PF) 100 MCG/2ML IJ SOLN
INTRAMUSCULAR | Status: AC
  Filled 2016-03-04: qty 4

## 2016-03-04 MED ORDER — MIDAZOLAM HCL 2 MG/2ML IJ SOLN
INTRAMUSCULAR | Status: AC | PRN
  Administered 2016-03-04: 0.5 mg via INTRAVENOUS
  Administered 2016-03-04: 1 mg via INTRAVENOUS
  Administered 2016-03-04: 0.5 mg via INTRAVENOUS

## 2016-03-04 MED ORDER — LIDOCAINE HCL 1 % IJ SOLN
INTRAMUSCULAR | Status: AC
  Filled 2016-03-04: qty 20

## 2016-03-04 MED ORDER — CEFAZOLIN SODIUM-DEXTROSE 2-4 GM/100ML-% IV SOLN
2.0000 g | Freq: Once | INTRAVENOUS | Status: AC
  Administered 2016-03-04: 2 g via INTRAVENOUS
  Filled 2016-03-04: qty 100

## 2016-03-04 MED ORDER — MIDAZOLAM HCL 2 MG/2ML IJ SOLN
INTRAMUSCULAR | Status: AC
  Filled 2016-03-04: qty 4

## 2016-03-04 MED ORDER — FLUMAZENIL 0.5 MG/5ML IV SOLN
INTRAVENOUS | Status: AC
  Filled 2016-03-04: qty 5

## 2016-03-04 MED ORDER — SODIUM CHLORIDE 0.9 % IV SOLN
INTRAVENOUS | Status: DC
  Administered 2016-03-04: 10:00:00 via INTRAVENOUS

## 2016-03-04 MED ORDER — LIDOCAINE HCL 1 % IJ SOLN
INTRAMUSCULAR | Status: AC | PRN
  Administered 2016-03-04: 15 mL

## 2016-03-04 NOTE — Discharge Instructions (Signed)
Implanted Port Home Guide °An implanted port is a type of central line that is placed under the skin. Central lines are used to provide IV access when treatment or nutrition needs to be given through a person's veins. Implanted ports are used for long-term IV access. An implanted port may be placed because:  °· You need IV medicine that would be irritating to the small veins in your hands or arms.   °· You need long-term IV medicines, such as antibiotics.   °· You need IV nutrition for a long period.   °· You need frequent blood draws for lab tests.   °· You need dialysis.   °Implanted ports are usually placed in the chest area, but they can also be placed in the upper arm, the abdomen, or the leg. An implanted port has two main parts:  °· Reservoir. The reservoir is round and will appear as a small, raised area under your skin. The reservoir is the part where a needle is inserted to give medicines or draw blood.   °· Catheter. The catheter is a thin, flexible tube that extends from the reservoir. The catheter is placed into a large vein. Medicine that is inserted into the reservoir goes into the catheter and then into the vein.   °HOW WILL I CARE FOR MY INCISION SITE? °Do not get the incision site wet. Bathe or shower as directed by your health care provider.  °HOW IS MY PORT ACCESSED? °Special steps must be taken to access the port:  °· Before the port is accessed, a numbing cream can be placed on the skin. This helps numb the skin over the port site.   °· Your health care provider uses a sterile technique to access the port. °· Your health care provider must put on a mask and sterile gloves. °· The skin over your port is cleaned carefully with an antiseptic and allowed to dry. °· The port is gently pinched between sterile gloves, and a needle is inserted into the port. °· Only "non-coring" port needles should be used to access the port. Once the port is accessed, a blood return should be checked. This helps  ensure that the port is in the vein and is not clogged.   °· If your port needs to remain accessed for a constant infusion, a clear (transparent) bandage will be placed over the needle site. The bandage and needle will need to be changed every week, or as directed by your health care provider.   °· Keep the bandage covering the needle clean and dry. Do not get it wet. Follow your health care provider's instructions on how to take a shower or bath while the port is accessed.   °· If your port does not need to stay accessed, no bandage is needed over the port.   °WHAT IS FLUSHING? °Flushing helps keep the port from getting clogged. Follow your health care provider's instructions on how and when to flush the port. Ports are usually flushed with saline solution or a medicine called heparin. The need for flushing will depend on how the port is used.  °· If the port is used for intermittent medicines or blood draws, the port will need to be flushed:   °· After medicines have been given.   °· After blood has been drawn.   °· As part of routine maintenance.   °· If a constant infusion is running, the port may not need to be flushed.   °HOW LONG WILL MY PORT STAY IMPLANTED? °The port can stay in for as long as your health care   provider thinks it is needed. When it is time for the port to come out, surgery will be done to remove it. The procedure is similar to the one performed when the port was put in.  WHEN SHOULD I SEEK IMMEDIATE MEDICAL CARE? When you have an implanted port, you should seek immediate medical care if:   You notice a bad smell coming from the incision site.   You have swelling, redness, or drainage at the incision site.   You have more swelling or pain at the port site or the surrounding area.   You have a fever that is not controlled with medicine. This information is not intended to replace advice given to you by your health care provider. Make sure you discuss any questions you have with  your health care provider. Document Released: 01/14/2005 Document Revised: 11/04/2012 Document Reviewed: 09/21/2012 Elsevier Interactive Patient Education  2017 [place]. Moderate Conscious Sedation, Adult, Care After These instructions provide you with information about caring for yourself after your procedure. Your health care provider may also give you more specific instructions. Your treatment has been planned according to current medical practices, but problems sometimes occur. Call your health care provider if you have any problems or questions after your procedure. What can I expect after the procedure? After your procedure, it is common:  To feel sleepy for several hours.  To feel clumsy and have poor balance for several hours.  To have poor judgment for several hours.  To vomit if you eat too soon. Follow these instructions at home: For at least 24 hours after the procedure:   Do not:  Participate in activities where you could fall or become injured.  Drive.  Use heavy machinery.  Drink alcohol.  Take sleeping pills or medicines that cause drowsiness.  Make important decisions or sign legal documents.  Take care of children on your own.  Rest. Eating and drinking  Follow the diet recommended by your health care provider.  If you vomit:  Drink water, juice, or soup when you can drink without vomiting.  Make sure you have little or no nausea before eating solid foods. General instructions  Have a responsible adult stay with you until you are awake and alert.  Take over-the-counter and prescription medicines only as told by your health care provider.  If you smoke, do not smoke without supervision.  Keep all follow-up visits as told by your health care provider. This is important. Contact a health care provider if:  You keep feeling nauseous or you keep vomiting.  You feel light-headed.  You develop a rash.  You have a fever. Get help right  away if:  You have trouble breathing. This information is not intended to replace advice given to you by your health care provider. Make sure you discuss any questions you have with your health care provider. Document Released: 11/04/2012 Document Revised: 06/19/2015 Document Reviewed: 05/06/2015 Elsevier Interactive Patient Education  2017 [employer].

## 2016-03-04 NOTE — H&P (Signed)
Chief Complaint: lymphoma  Referring Physician:Dr. Heath Lark  Supervising Physician: Aletta Edouard  Patient Status: [facility] - Out-pt  HPI: Jamie Mendez is an 83 y.o. female who we just saw about 2 weeks ago for a LN BX to confirm recurrence of marginal zone lymphoma.  This bx did confirm recurrence.  She presents today for placement of a PAC so she can initiate IV chemotherapy this Thursday.  No changes in her medical history since she was last seen 2 weeks ago.  Past Medical History:  Past Medical History:  Diagnosis Date  . Anemia   . Diabetes ([place])   . High blood pressure   . Hyperlipidemia   . Marginal zone lymphoma ([place]) 09/02/2013  . Neuropathy ([place])   . Splenomegaly 07/23/2013  . Wears glasses   . Weight loss 07/23/2013    Past Surgical History:  Past Surgical History:  Procedure Laterality Date  . AXILLARY LYMPH NODE BIOPSY Left 08/23/2013   Procedure: LEFT AXILLARY LYMPH NODE BIOPSY;  Surgeon: Shann Medal, MD;  Location: [place];  Service: General;  Laterality: Left;  . BREAST SURGERY Right 1989   biopsy-benign  . COLONOSCOPY    . ESOPHAGOGASTRODUODENOSCOPY    . POLYPECTOMY  1989  . TONSILLECTOMY      Family History:  Family History  Problem Relation Age of Onset  . Cancer Mother     bladder  . Colon cancer Neg Hx     Social History:  reports that she has never smoked. She has never used smokeless tobacco. She reports that she does not drink alcohol or use drugs.  Allergies:  Allergies  Allergen Reactions  . Aspirin Other (See Comments)    Nose bleeds  . Capoten [Captopril] Other (See Comments)    Mouth numbness   . Levemir [Insulin Detemir] Other (See Comments)    Decreased blood sugar  . Micronase [Glyburide]   . Onglyza [Saxagliptin] Other (See Comments)    Elevates blood sugar  . Statins     Numbness in face  . Zocor [Simvastatin] Other (See Comments)    Extreme fatigue    Medications: Medications reviewed in  epic  Please HPI for pertinent positives, otherwise complete 10 system ROS negative.  Mallampati Score: MD Evaluation Airway: WNL Heart: WNL Abdomen: WNL Chest/ Lungs: WNL ASA  Classification: 2 Mallampati/Airway Score: Two  Physical Exam: BP (!) 187/82 (BP Location: Right Arm)   Pulse 92   Temp 98.1 F (36.7 C) (Oral)   Resp 16   SpO2 100%  There is no height or weight on file to calculate BMI. General: pleasant, WD, WN white female who is laying in bed in NAD HEENT: head is normocephalic, atraumatic.  Sclera are noninjected.  PERRL.  Ears and nose without any masses or lesions.  Mouth is pink and moist Heart: regular, rate, and rhythm.  Normal s1,s2. No obvious murmurs, gallops, or rubs noted.  Palpable radial and pedal pulses bilaterally Lungs: CTAB, no wheezes, rhonchi, or rales noted.  Respiratory effort nonlabored Abd: soft, NT, ND, +BS, no masses, hernias, or organomegaly Psych: A&Ox3 with an appropriate affect.   Labs: pending  Imaging: No results found.  Assessment/Plan 1. Marginal zone lymphoma -we will place a PAC today so she can initiate chemotherapy -labs are pending, but vitals have been reviewed -Risks and Benefits discussed with the patient including, but not limited to bleeding, infection, pneumothorax, or fibrin sheath development and need for additional procedures. All of the patient's  questions were answered, patient is agreeable to proceed. Consent signed and in chart.  Thank you for this interesting consult.  I greatly enjoyed meeting JEARLINE LAPAN and look forward to participating in their care.  A copy of this report was sent to the requesting provider on this date.  Electronically Signed: Henreitta Cea 03/04/2016, 9:51 AM   I spent a total of    25 Minutes in face to face in clinical consultation, greater than 50% of which was counseling/coordinating care for lymphoma

## 2016-03-04 NOTE — Procedures (Signed)
Interventional Radiology Procedure Note  Procedure: Placement of a right IJ approach single lumen PowerPort.  Tip is positioned at the superior cavoatrial junction and catheter is ready for immediate use.  Complications: No immediate Recommendations:  - Ok to shower tomorrow - Do not submerge for 7 days - Routine line care   Jamilynn Whitacre T. Ayona Yniguez, M.D Pager:  [phone]   

## 2016-03-05 ENCOUNTER — Telehealth: Payer: Self-pay

## 2016-03-05 ENCOUNTER — Other Ambulatory Visit: Payer: Self-pay | Admitting: Hematology and Oncology

## 2016-03-05 ENCOUNTER — Other Ambulatory Visit (HOSPITAL_BASED_OUTPATIENT_CLINIC_OR_DEPARTMENT_OTHER): Payer: Medicare Other

## 2016-03-05 DIAGNOSIS — C8580 Other specified types of non-Hodgkin lymphoma, unspecified site: Secondary | ICD-10-CM

## 2016-03-05 DIAGNOSIS — C8588 Other specified types of non-Hodgkin lymphoma, lymph nodes of multiple sites: Secondary | ICD-10-CM

## 2016-03-05 LAB — CBC WITH DIFFERENTIAL/PLATELET
BASO%: 0.1 % (ref 0.0–2.0)
BASOS ABS: 0 10*3/uL (ref 0.0–0.1)
EOS%: 0 % (ref 0.0–7.0)
Eosinophils Absolute: 0 10*3/uL (ref 0.0–0.5)
HEMATOCRIT: 32.7 % — AB (ref 34.8–46.6)
HGB: 10.9 g/dL — ABNORMAL LOW (ref 11.6–15.9)
LYMPH#: 0.7 10*3/uL — AB (ref 0.9–3.3)
LYMPH%: 7.3 % — AB (ref 14.0–49.7)
MCH: 28 pg (ref 25.1–34.0)
MCHC: 33.3 g/dL (ref 31.5–36.0)
MCV: 84.1 fL (ref 79.5–101.0)
MONO#: 0.5 10*3/uL (ref 0.1–0.9)
MONO%: 5.3 % (ref 0.0–14.0)
NEUT#: 7.9 10*3/uL — ABNORMAL HIGH (ref 1.5–6.5)
NEUT%: 87.3 % — AB (ref 38.4–76.8)
PLATELETS: 184 10*3/uL (ref 145–400)
RBC: 3.89 10*6/uL (ref 3.70–5.45)
RDW: 14.1 % (ref 11.2–14.5)
WBC: 9.1 10*3/uL (ref 3.9–10.3)

## 2016-03-05 LAB — COMPREHENSIVE METABOLIC PANEL
ALT: 12 U/L (ref 0–55)
ANION GAP: 10 meq/L (ref 3–11)
AST: 14 U/L (ref 5–34)
Albumin: 3.7 g/dL (ref 3.5–5.0)
Alkaline Phosphatase: 100 U/L (ref 40–150)
BUN: 19.1 mg/dL (ref 7.0–26.0)
CHLORIDE: 100 meq/L (ref 98–109)
CO2: 26 mEq/L (ref 22–29)
CREATININE: 1.1 mg/dL (ref 0.6–1.1)
Calcium: 10.3 mg/dL (ref 8.4–10.4)
EGFR: 47 mL/min/{1.73_m2} — AB (ref >90)
Glucose: 195 mg/dl — ABNORMAL HIGH (ref 70–140)
POTASSIUM: 3.7 meq/L (ref 3.5–5.1)
Sodium: 137 mEq/L (ref 136–145)
Total Bilirubin: 0.69 mg/dL (ref 0.20–1.20)
Total Protein: 7.1 g/dL (ref 6.4–8.3)

## 2016-03-05 LAB — URIC ACID: Uric Acid, Serum: 3.9 mg/dl (ref 2.6–7.4)

## 2016-03-05 LAB — LACTATE DEHYDROGENASE: LDH: 170 U/L (ref 125–245)

## 2016-03-05 NOTE — Telephone Encounter (Signed)
I apologize for the misunderstanding The first day is always longer due to risk of infusion reaction so it's longer 2nd day should only take 2 hours Current schedule is correct

## 2016-03-05 NOTE — Telephone Encounter (Signed)
The pt called about her infusion schedule: She was told 4 hr on Thursday and 6 hr on Friday by Dr Alvy Bimler.  She arranged her transportation. Then this was switched to 6 hr Thursday and 2 hr Friday. Is there a clinical reason for this? Can it be changed?

## 2016-03-05 NOTE — Telephone Encounter (Signed)
Called patient back regarding questions about infusion times. Told patient the below reasons. The first day is always longer due to risk of infusion reaction so it's longer. 2nd day should only take 2 hours. Current schedule is correct. Verbalized understanding.

## 2016-03-06 LAB — HEPATITIS B SURFACE ANTIBODY,QUALITATIVE: Hep B Surface Ab, Qual: REACTIVE

## 2016-03-06 LAB — HEPATITIS B SURFACE ANTIGEN: HBsAg Screen: NEGATIVE

## 2016-03-06 LAB — HEPATITIS B CORE ANTIBODY, IGM: Hep B Core Ab, IgM: NEGATIVE

## 2016-03-07 ENCOUNTER — Telehealth: Payer: Self-pay | Admitting: *Deleted

## 2016-03-07 ENCOUNTER — Other Ambulatory Visit: Payer: Self-pay | Admitting: *Deleted

## 2016-03-07 ENCOUNTER — Ambulatory Visit (HOSPITAL_BASED_OUTPATIENT_CLINIC_OR_DEPARTMENT_OTHER): Payer: Medicare Other

## 2016-03-07 VITALS — BP 135/61 | HR 94 | Temp 98.5°F | Resp 16

## 2016-03-07 DIAGNOSIS — Z5111 Encounter for antineoplastic chemotherapy: Secondary | ICD-10-CM

## 2016-03-07 DIAGNOSIS — C8588 Other specified types of non-Hodgkin lymphoma, lymph nodes of multiple sites: Secondary | ICD-10-CM | POA: Diagnosis not present

## 2016-03-07 DIAGNOSIS — C8580 Other specified types of non-Hodgkin lymphoma, unspecified site: Secondary | ICD-10-CM

## 2016-03-07 DIAGNOSIS — Z5112 Encounter for antineoplastic immunotherapy: Secondary | ICD-10-CM | POA: Diagnosis not present

## 2016-03-07 MED ORDER — CLONIDINE HCL 0.1 MG PO TABS
ORAL_TABLET | ORAL | Status: AC
  Filled 2016-03-07: qty 1

## 2016-03-07 MED ORDER — DIPHENHYDRAMINE HCL 50 MG/ML IJ SOLN
50.0000 mg | Freq: Once | INTRAMUSCULAR | Status: AC
  Administered 2016-03-07: 50 mg via INTRAVENOUS

## 2016-03-07 MED ORDER — SODIUM CHLORIDE 0.9 % IV SOLN
Freq: Once | INTRAVENOUS | Status: AC
  Administered 2016-03-07: 11:00:00 via INTRAVENOUS

## 2016-03-07 MED ORDER — SODIUM CHLORIDE 0.9 % IV SOLN
1000.0000 mg | Freq: Once | INTRAVENOUS | Status: AC
  Administered 2016-03-07: 1000 mg via INTRAVENOUS
  Filled 2016-03-07: qty 40

## 2016-03-07 MED ORDER — ACETAMINOPHEN 325 MG PO TABS
650.0000 mg | ORAL_TABLET | Freq: Once | ORAL | Status: AC
  Administered 2016-03-07: 650 mg via ORAL

## 2016-03-07 MED ORDER — LIDOCAINE-PRILOCAINE 2.5-2.5 % EX CREA: 1.0000 "application " | TOPICAL_CREAM | CUTANEOUS | 3 refills | Status: DC | PRN

## 2016-03-07 MED ORDER — CLONIDINE HCL 0.1 MG PO TABS
0.1000 mg | ORAL_TABLET | Freq: Once | ORAL | Status: AC
  Administered 2016-03-07: 0.1 mg via ORAL

## 2016-03-07 MED ORDER — SODIUM CHLORIDE 0.9% FLUSH
10.0000 mL | INTRAVENOUS | Status: DC | PRN
  Administered 2016-03-07: 10 mL
  Filled 2016-03-07: qty 10

## 2016-03-07 MED ORDER — HEPARIN SOD (PORK) LOCK FLUSH 100 UNIT/ML IV SOLN
500.0000 [IU] | Freq: Once | INTRAVENOUS | Status: AC | PRN
  Administered 2016-03-07: 500 [IU]
  Filled 2016-03-07: qty 5

## 2016-03-07 MED ORDER — PALONOSETRON HCL INJECTION 0.25 MG/5ML
0.2500 mg | Freq: Once | INTRAVENOUS | Status: AC
  Administered 2016-03-07: 0.25 mg via INTRAVENOUS

## 2016-03-07 MED ORDER — SODIUM CHLORIDE 0.9 % IV SOLN
20.0000 mg | Freq: Once | INTRAVENOUS | Status: AC
  Administered 2016-03-07: 20 mg via INTRAVENOUS
  Filled 2016-03-07: qty 2

## 2016-03-07 MED ORDER — ACETAMINOPHEN 325 MG PO TABS
ORAL_TABLET | ORAL | Status: AC
  Filled 2016-03-07: qty 2

## 2016-03-07 MED ORDER — DIPHENHYDRAMINE HCL 50 MG/ML IJ SOLN
INTRAMUSCULAR | Status: AC
Start: 2016-03-07 — End: 2016-03-07
  Filled 2016-03-07: qty 1

## 2016-03-07 MED ORDER — BENDAMUSTINE HCL CHEMO INJECTION 100 MG/4ML
90.0000 mg/m2 | Freq: Once | INTRAVENOUS | Status: AC
  Administered 2016-03-07: 150 mg via INTRAVENOUS
  Filled 2016-03-07: qty 6

## 2016-03-07 MED ORDER — PALONOSETRON HCL INJECTION 0.25 MG/5ML
INTRAVENOUS | Status: AC
  Filled 2016-03-07: qty 5

## 2016-03-07 NOTE — Progress Notes (Signed)
03/07/2016 Blood pressure still remains elevated after 1 dose of Clonidine 0.1 mg.  Per Dr. Alvy Bimler give a second Clonidine 0.1 mg and proceed with Dyann Kief & Bendeka.

## 2016-03-07 NOTE — Patient Instructions (Signed)
[facility] Discharge Instructions for Patients Receiving Chemotherapy  Today you received the following chemotherapy agents:  Gazyva and Bendeka.  To help prevent nausea and vomiting after your treatment, we encourage you to take your nausea medication as directed.   If you develop nausea and vomiting that is not controlled by your nausea medication, call the clinic.   BELOW ARE SYMPTOMS THAT SHOULD BE REPORTED IMMEDIATELY:  *FEVER GREATER THAN 100.5 F  *CHILLS WITH OR WITHOUT FEVER  NAUSEA AND VOMITING THAT IS NOT CONTROLLED WITH YOUR NAUSEA MEDICATION  *UNUSUAL SHORTNESS OF BREATH  *UNUSUAL BRUISING OR BLEEDING  TENDERNESS IN MOUTH AND THROAT WITH OR WITHOUT PRESENCE OF ULCERS  *URINARY PROBLEMS  *BOWEL PROBLEMS  UNUSUAL RASH Items with * indicate a potential emergency and should be followed up as soon as possible.  Feel free to call the clinic you have any questions or concerns. The clinic phone number is [phone].  Please show the [facility] at check-in to the Emergency Department and triage nurse.  Obinutuzumab injection What is this medicine? OBINUTUZUMAB (OH bi nue TOOZ ue mab) is a monoclonal antibody. It is used to treat chronic lymphocytic leukemia (CLL) and a type of non-Hodgkin lymphoma (NHL), follicular lymphoma. COMMON BRAND NAME(S): GAZYVA What should I tell my health care provider before I take this medicine? They need to know if you have any of these conditions: -infection (especially a virus infection such as hepatitis B virus) -lung or breathing disease -heart disease -take medicines that treat or prevent blood clots -an unusual or allergic reaction to obinutuzumab, other medicines, foods, dyes, or preservatives -pregnant or trying to get pregnant -breast-feeding How should I use this medicine? This medicine is for infusion into a vein. It is given by a health care professional in a hospital or clinic setting. Talk to your  pediatrician regarding the use of this medicine in children. Special care may be needed. What if I miss a dose? Keep appointments for follow-up doses as directed. It is important not to miss your dose. Call your doctor or health care professional if you are unable to keep an appointment. What may interact with this medicine? -live virus vaccines What should I watch for while using this medicine? Report any side effects that you notice during your treatment right away, such as changes in your breathing, fever, chills, dizziness or lightheadedness. These effects are more common with the first dose. Visit your prescriber or health care professional for checks on your progress. You will need to have regular blood work. Report any other side effects. The side effects of this medicine can continue after you finish your treatment. Continue your course of treatment even though you feel ill unless your doctor tells you to stop. Call your doctor or health care professional for advice if you get a fever, chills or sore throat, or other symptoms of a cold or flu. Do not treat yourself. This drug decreases your body's ability to fight infections. Try to avoid being around people who are sick. This medicine may increase your risk to bruise or bleed. Call your doctor or health care professional if you notice any unusual bleeding. What side effects may I notice from receiving this medicine? Side effects that you should report to your doctor or health care professional as soon as possible: -allergic reactions like skin rash, itching or hives, swelling of the face, lips, or tongue -breathing problems -changes in vision -chest pain or chest tightness -confusion -dizziness -loss of  balance or coordination -low blood counts - this medicine may decrease the number of white blood cells, red blood cells and platelets. You may be at increased risk for infections and bleeding. -signs of decreased platelets or bleeding -  bruising, pinpoint red spots on the skin, black, tarry stools, blood in the urine -signs of infection - fever or chills, cough, sore throat, pain or trouble passing urine -signs and symptoms of liver injury like dark yellow or brown urine; general ill feeling or flu-like symptoms; light-colored stools; loss of appetite; nausea; right upper belly pain; unusually weak or tired; yellowing of the eyes or skin -trouble speaking or understanding -trouble walking -vomiting Side effects that usually do not require medical attention (report to your doctor or health care professional if they continue or are bothersome): -constipation -joint pain -muscle pain Where should I keep my medicine? This drug is only given in a hospital or clinic and will not be stored at home.  2017 Elsevier/Gold Standard (2015-02-16 08:54:03)  Bendamustine Injection What is this medicine? BENDAMUSTINE (BEN da MUS teen) is a chemotherapy drug. It is used to treat chronic lymphocytic leukemia and non-Hodgkin lymphoma. COMMON BRAND NAME(S): BENDEKA, Treanda What should I tell my health care provider before I take this medicine? They need to know if you have any of these conditions: -infection (especially a virus infection such as chickenpox, cold sores, or herpes) -kidney disease -liver disease -an unusual or allergic reaction to bendamustine, mannitol, other medicines, foods, dyes, or preservatives -pregnant or trying to get pregnant -breast-feeding How should I use this medicine? This medicine is for infusion into a vein. It is given by a health care professional in a hospital or clinic setting. Talk to your pediatrician regarding the use of this medicine in children. Special care may be needed. What if I miss a dose? It is important not to miss your dose. Call your doctor or health care professional if you are unable to keep an appointment. What may interact with this medicine? Do not take this medicine with any of  the following medications: -clozapine This medicine may also interact with the following medications: -atazanavir -cimetidine -ciprofloxacin -enoxacin -fluvoxamine -medicines for seizures like carbamazepine and phenobarbital -mexiletine -rifampin -tacrine -thiabendazole -zileuton What should I watch for while using this medicine? This drug may make you feel generally unwell. This is not uncommon, as chemotherapy can affect healthy cells as well as cancer cells. Report any side effects. Continue your course of treatment even though you feel ill unless your doctor tells you to stop. You may need blood work done while you are taking this medicine. Call your doctor or health care professional for advice if you get a fever, chills or sore throat, or other symptoms of a cold or flu. Do not treat yourself. This drug decreases your body's ability to fight infections. Try to avoid being around people who are sick. This medicine may increase your risk to bruise or bleed. Call your doctor or health care professional if you notice any unusual bleeding. Talk to your doctor about your risk of cancer. You may be more at risk for certain types of cancers if you take this medicine. Do not become pregnant while taking this medicine or for 3 months after stopping it. Women should inform their doctor if they wish to become pregnant or think they might be pregnant. Men should not father a child while taking this medicine and for 3 months after stopping it.There is a potential for serious side effects  to an unborn child. Talk to your health care professional or pharmacist for more information. Do not breast-feed an infant while taking this medicine. This medicine may interfere with the ability to have a child. You should talk with your doctor or health care professional if you are concerned about your fertility. What side effects may I notice from receiving this medicine? Side effects that you should report to your  doctor or health care professional as soon as possible: -allergic reactions like skin rash, itching or hives, swelling of the face, lips, or tongue -low blood counts - this medicine may decrease the number of white blood cells, red blood cells and platelets. You may be at increased risk for infections and bleeding. -redness, blistering, peeling or loosening of the skin, including inside the mouth -signs of infection - fever or chills, cough, sore throat, pain or difficulty passing urine -signs of decreased platelets or bleeding - bruising, pinpoint red spots on the skin, black, tarry stools, blood in the urine -signs of decreased red blood cells - unusually weak or tired, fainting spells, lightheadedness -signs and symptoms of kidney injury like trouble passing urine or change in the amount of urine -signs and symptoms of liver injury like dark yellow or brown urine; general ill feeling or flu-like symptoms; light-colored stools; loss of appetite; nausea; right upper belly pain; unusually weak or tired; yellowing of the eyes or skin Side effects that usually do not require medical attention (report to your doctor or health care professional if they continue or are bothersome): -constipation -decreased appetite -diarrhea -headache -mouth sores -nausea/vomiting -tiredness Where should I keep my medicine? This drug is given in a hospital or clinic and will not be stored at home.  2017 Elsevier/Gold Standard (2014-11-17 08:45:41)

## 2016-03-08 ENCOUNTER — Ambulatory Visit (HOSPITAL_BASED_OUTPATIENT_CLINIC_OR_DEPARTMENT_OTHER): Payer: Medicare Other

## 2016-03-08 ENCOUNTER — Other Ambulatory Visit: Payer: Self-pay | Admitting: Hematology and Oncology

## 2016-03-08 VITALS — BP 189/67 | HR 98 | Temp 98.2°F | Resp 18

## 2016-03-08 DIAGNOSIS — C8580 Other specified types of non-Hodgkin lymphoma, unspecified site: Secondary | ICD-10-CM

## 2016-03-08 DIAGNOSIS — Z5112 Encounter for antineoplastic immunotherapy: Secondary | ICD-10-CM

## 2016-03-08 DIAGNOSIS — C8588 Other specified types of non-Hodgkin lymphoma, lymph nodes of multiple sites: Secondary | ICD-10-CM | POA: Diagnosis not present

## 2016-03-08 DIAGNOSIS — F411 Generalized anxiety disorder: Secondary | ICD-10-CM | POA: Insufficient documentation

## 2016-03-08 MED ORDER — LORAZEPAM 0.5 MG PO TABS: 0.5000 mg | ORAL_TABLET | Freq: Three times a day (TID) | ORAL | 0 refills | Status: DC | PRN

## 2016-03-08 MED ORDER — CLONIDINE HCL 0.1 MG PO TABS
ORAL_TABLET | ORAL | Status: AC
Start: 2016-03-08 — End: 2016-03-08
  Filled 2016-03-08: qty 1

## 2016-03-08 MED ORDER — DEXAMETHASONE SODIUM PHOSPHATE 10 MG/ML IJ SOLN
INTRAMUSCULAR | Status: AC
  Filled 2016-03-08: qty 1

## 2016-03-08 MED ORDER — DEXAMETHASONE SODIUM PHOSPHATE 10 MG/ML IJ SOLN
10.0000 mg | Freq: Once | INTRAMUSCULAR | Status: AC
  Administered 2016-03-08: 10 mg via INTRAVENOUS

## 2016-03-08 MED ORDER — BENDAMUSTINE HCL CHEMO INJECTION 100 MG/4ML
90.0000 mg/m2 | Freq: Once | INTRAVENOUS | Status: AC
  Administered 2016-03-08: 150 mg via INTRAVENOUS
  Filled 2016-03-08: qty 6

## 2016-03-08 MED ORDER — CLONIDINE HCL 0.1 MG PO TABS
0.1000 mg | ORAL_TABLET | Freq: Once | ORAL | Status: AC
  Administered 2016-03-08: 0.1 mg via ORAL

## 2016-03-08 MED ORDER — SODIUM CHLORIDE 0.9 % IV SOLN
Freq: Once | INTRAVENOUS | Status: AC
  Administered 2016-03-08: 14:00:00 via INTRAVENOUS

## 2016-03-08 MED ORDER — SODIUM CHLORIDE 0.9% FLUSH
10.0000 mL | INTRAVENOUS | Status: DC | PRN
  Filled 2016-03-08: qty 10

## 2016-03-08 MED ORDER — HEPARIN SOD (PORK) LOCK FLUSH 100 UNIT/ML IV SOLN
500.0000 [IU] | Freq: Once | INTRAVENOUS | Status: DC | PRN
  Filled 2016-03-08: qty 5

## 2016-03-08 NOTE — Progress Notes (Signed)
03/08/2016 Per Dr. Alvy Bimler ok to proceed with [facility] with elevated blood pressure.  Give patient 0.1 mg Clonidine and proceed.    Dr. Alvy Bimler also prescribed Ativan for patient to take prior to next infusion appointment to see if it helps with blood pressure prior to next infusion.  Patient is also scheduled to see PCP Feb 27 to reevaluate her elevated blood pressure.

## 2016-03-08 NOTE — Patient Instructions (Signed)
[facility] Discharge Instructions for Patients Receiving Chemotherapy  Today you received the following chemotherapy agents:  Bendeka.  To help prevent nausea and vomiting after your treatment, we encourage you to take your nausea medication as directed.   If you develop nausea and vomiting that is not controlled by your nausea medication, call the clinic.   BELOW ARE SYMPTOMS THAT SHOULD BE REPORTED IMMEDIATELY:  *FEVER GREATER THAN 100.5 F  *CHILLS WITH OR WITHOUT FEVER  NAUSEA AND VOMITING THAT IS NOT CONTROLLED WITH YOUR NAUSEA MEDICATION  *UNUSUAL SHORTNESS OF BREATH  *UNUSUAL BRUISING OR BLEEDING  TENDERNESS IN MOUTH AND THROAT WITH OR WITHOUT PRESENCE OF ULCERS  *URINARY PROBLEMS  *BOWEL PROBLEMS  UNUSUAL RASH Items with * indicate a potential emergency and should be followed up as soon as possible.  Feel free to call the clinic you have any questions or concerns. The clinic phone number is [phone].  Please show the [place] [facility] at check-in to the Emergency Department and triage nurse.  Obinutuzumab injection What is this medicine? OBINUTUZUMAB (OH bi nue TOOZ ue mab) is a monoclonal antibody. It is used to treat chronic lymphocytic leukemia (CLL) and a type of non-Hodgkin lymphoma (NHL), follicular lymphoma. COMMON BRAND NAME(S): GAZYVA What should I tell my health care provider before I take this medicine? They need to know if you have any of these conditions: -infection (especially a virus infection such as hepatitis B virus) -lung or breathing disease -heart disease -take medicines that treat or prevent blood clots -an unusual or allergic reaction to obinutuzumab, other medicines, foods, dyes, or preservatives -pregnant or trying to get pregnant -breast-feeding How should I use this medicine? This medicine is for infusion into a vein. It is given by a health care professional in a hospital or clinic setting. Talk to your  pediatrician regarding the use of this medicine in children. Special care may be needed. What if I miss a dose? Keep appointments for follow-up doses as directed. It is important not to miss your dose. Call your doctor or health care professional if you are unable to keep an appointment. What may interact with this medicine? -live virus vaccines What should I watch for while using this medicine? Report any side effects that you notice during your treatment right away, such as changes in your breathing, fever, chills, dizziness or lightheadedness. These effects are more common with the first dose. Visit your prescriber or health care professional for checks on your progress. You will need to have regular blood work. Report any other side effects. The side effects of this medicine can continue after you finish your treatment. Continue your course of treatment even though you feel ill unless your doctor tells you to stop. Call your doctor or health care professional for advice if you get a fever, chills or sore throat, or other symptoms of a cold or flu. Do not treat yourself. This drug decreases your body's ability to fight infections. Try to avoid being around people who are sick. This medicine may increase your risk to bruise or bleed. Call your doctor or health care professional if you notice any unusual bleeding. What side effects may I notice from receiving this medicine? Side effects that you should report to your doctor or health care professional as soon as possible: -allergic reactions like skin rash, itching or hives, swelling of the face, lips, or tongue -breathing problems -changes in vision -chest pain or chest tightness -confusion -dizziness -loss of balance or  coordination -low blood counts - this medicine may decrease the number of white blood cells, red blood cells and platelets. You may be at increased risk for infections and bleeding. -signs of decreased platelets or bleeding -  bruising, pinpoint red spots on the skin, black, tarry stools, blood in the urine -signs of infection - fever or chills, cough, sore throat, pain or trouble passing urine -signs and symptoms of liver injury like dark yellow or brown urine; general ill feeling or flu-like symptoms; light-colored stools; loss of appetite; nausea; right upper belly pain; unusually weak or tired; yellowing of the eyes or skin -trouble speaking or understanding -trouble walking -vomiting Side effects that usually do not require medical attention (report to your doctor or health care professional if they continue or are bothersome): -constipation -joint pain -muscle pain Where should I keep my medicine? This drug is only given in a hospital or clinic and will not be stored at home.  2017 Elsevier/Gold Standard (2015-02-16 08:54:03)  Bendamustine Injection What is this medicine? BENDAMUSTINE (BEN da MUS teen) is a chemotherapy drug. It is used to treat chronic lymphocytic leukemia and non-Hodgkin lymphoma. COMMON BRAND NAME(S): BENDEKA, Treanda What should I tell my health care provider before I take this medicine? They need to know if you have any of these conditions: -infection (especially a virus infection such as chickenpox, cold sores, or herpes) -kidney disease -liver disease -an unusual or allergic reaction to bendamustine, mannitol, other medicines, foods, dyes, or preservatives -pregnant or trying to get pregnant -breast-feeding How should I use this medicine? This medicine is for infusion into a vein. It is given by a health care professional in a hospital or clinic setting. Talk to your pediatrician regarding the use of this medicine in children. Special care may be needed. What if I miss a dose? It is important not to miss your dose. Call your doctor or health care professional if you are unable to keep an appointment. What may interact with this medicine? Do not take this medicine with any of  the following medications: -clozapine This medicine may also interact with the following medications: -atazanavir -cimetidine -ciprofloxacin -enoxacin -fluvoxamine -medicines for seizures like carbamazepine and phenobarbital -mexiletine -rifampin -tacrine -thiabendazole -zileuton What should I watch for while using this medicine? This drug may make you feel generally unwell. This is not uncommon, as chemotherapy can affect healthy cells as well as cancer cells. Report any side effects. Continue your course of treatment even though you feel ill unless your doctor tells you to stop. You may need blood work done while you are taking this medicine. Call your doctor or health care professional for advice if you get a fever, chills or sore throat, or other symptoms of a cold or flu. Do not treat yourself. This drug decreases your body's ability to fight infections. Try to avoid being around people who are sick. This medicine may increase your risk to bruise or bleed. Call your doctor or health care professional if you notice any unusual bleeding. Talk to your doctor about your risk of cancer. You may be more at risk for certain types of cancers if you take this medicine. Do not become pregnant while taking this medicine or for 3 months after stopping it. Women should inform their doctor if they wish to become pregnant or think they might be pregnant. Men should not father a child while taking this medicine and for 3 months after stopping it.There is a potential for serious side effects to an  unborn child. Talk to your health care professional or pharmacist for more information. Do not breast-feed an infant while taking this medicine. This medicine may interfere with the ability to have a child. You should talk with your doctor or health care professional if you are concerned about your fertility. What side effects may I notice from receiving this medicine? Side effects that you should report to your  doctor or health care professional as soon as possible: -allergic reactions like skin rash, itching or hives, swelling of the face, lips, or tongue -low blood counts - this medicine may decrease the number of white blood cells, red blood cells and platelets. You may be at increased risk for infections and bleeding. -redness, blistering, peeling or loosening of the skin, including inside the mouth -signs of infection - fever or chills, cough, sore throat, pain or difficulty passing urine -signs of decreased platelets or bleeding - bruising, pinpoint red spots on the skin, black, tarry stools, blood in the urine -signs of decreased red blood cells - unusually weak or tired, fainting spells, lightheadedness -signs and symptoms of kidney injury like trouble passing urine or change in the amount of urine -signs and symptoms of liver injury like dark yellow or brown urine; general ill feeling or flu-like symptoms; light-colored stools; loss of appetite; nausea; right upper belly pain; unusually weak or tired; yellowing of the eyes or skin Side effects that usually do not require medical attention (report to your doctor or health care professional if they continue or are bothersome): -constipation -decreased appetite -diarrhea -headache -mouth sores -nausea/vomiting -tiredness Where should I keep my medicine? This drug is given in a hospital or clinic and will not be stored at home.  2017 Elsevier/Gold Standard (2014-11-17 08:45:41)

## 2016-03-11 ENCOUNTER — Telehealth: Payer: Self-pay | Admitting: *Deleted

## 2016-03-11 DIAGNOSIS — I10 Essential (primary) hypertension: Secondary | ICD-10-CM

## 2016-03-11 MED ORDER — AMLODIPINE BESYLATE 10 MG PO TABS: 10.0000 mg | ORAL_TABLET | Freq: Every day | ORAL | 3 refills | Status: DC

## 2016-03-11 NOTE — Progress Notes (Signed)
Jamie Mendez is a member of the [facility] [employer] and exercises regularly.  A week or two ago she told me of her upcoming port-a-cath placement and chemo treatments.  Today she came in to walk and didn't feel up to it and asked to have her BP checked.  She stated that her BP has been elevated x 4 days as she's been checking it at home.  Her seated, manual BP was 190/94.  She denies any other pain other than her ears have been throbbing and she is doesn't feel well and feels fatigued.  She is warm, dry, and pink and able to ambulate without difficulty.  She stated she will go home and call the cancer center for advice.

## 2016-03-11 NOTE — Telephone Encounter (Signed)
"  I have a message for Dr. Alvy Bimler.  I can't get my B/P down.  It's averaging 160's and above.  Return number [phone]."

## 2016-03-11 NOTE — Telephone Encounter (Signed)
Call in amlodipine 10 mg daily PO to take at night time 30 tabs, 3 refills

## 2016-03-11 NOTE — Telephone Encounter (Signed)
Called returned to patient informing her of this new order.  Denies questions.  "Hopefully this will make me feel better."

## 2016-03-13 ENCOUNTER — Encounter: Payer: Self-pay | Admitting: Pharmacist

## 2016-03-14 ENCOUNTER — Other Ambulatory Visit (HOSPITAL_BASED_OUTPATIENT_CLINIC_OR_DEPARTMENT_OTHER): Payer: Medicare Other

## 2016-03-14 ENCOUNTER — Other Ambulatory Visit: Payer: Self-pay | Admitting: Hematology and Oncology

## 2016-03-14 ENCOUNTER — Ambulatory Visit (HOSPITAL_BASED_OUTPATIENT_CLINIC_OR_DEPARTMENT_OTHER): Payer: Medicare Other

## 2016-03-14 VITALS — BP 139/65 | HR 103 | Temp 98.5°F | Resp 16

## 2016-03-14 DIAGNOSIS — Z5112 Encounter for antineoplastic immunotherapy: Secondary | ICD-10-CM

## 2016-03-14 DIAGNOSIS — C8580 Other specified types of non-Hodgkin lymphoma, unspecified site: Secondary | ICD-10-CM

## 2016-03-14 DIAGNOSIS — C8588 Other specified types of non-Hodgkin lymphoma, lymph nodes of multiple sites: Secondary | ICD-10-CM

## 2016-03-14 DIAGNOSIS — E119 Type 2 diabetes mellitus without complications: Secondary | ICD-10-CM

## 2016-03-14 DIAGNOSIS — C8290 Follicular lymphoma, unspecified, unspecified site: Secondary | ICD-10-CM

## 2016-03-14 LAB — CBC WITH DIFFERENTIAL/PLATELET
BASO%: 0.2 % (ref 0.0–2.0)
Basophils Absolute: 0 10*3/uL (ref 0.0–0.1)
EOS%: 1.8 % (ref 0.0–7.0)
Eosinophils Absolute: 0.1 10*3/uL (ref 0.0–0.5)
HCT: 34 % — ABNORMAL LOW (ref 34.8–46.6)
HGB: 11.5 g/dL — ABNORMAL LOW (ref 11.6–15.9)
LYMPH#: 0.2 10*3/uL — AB (ref 0.9–3.3)
LYMPH%: 3.7 % — AB (ref 14.0–49.7)
MCH: 28 pg (ref 25.1–34.0)
MCHC: 33.8 g/dL (ref 31.5–36.0)
MCV: 82.7 fL (ref 79.5–101.0)
MONO#: 0.4 10*3/uL (ref 0.1–0.9)
MONO%: 6.3 % (ref 0.0–14.0)
NEUT%: 88 % — ABNORMAL HIGH (ref 38.4–76.8)
NEUTROS ABS: 5.5 10*3/uL (ref 1.5–6.5)
PLATELETS: 154 10*3/uL (ref 145–400)
RBC: 4.11 10*6/uL (ref 3.70–5.45)
RDW: 14.2 % (ref 11.2–14.5)
WBC: 6.2 10*3/uL (ref 3.9–10.3)

## 2016-03-14 LAB — COMPREHENSIVE METABOLIC PANEL
ALT: 13 U/L (ref 0–55)
ANION GAP: 10 meq/L (ref 3–11)
AST: 14 U/L (ref 5–34)
Albumin: 3.5 g/dL (ref 3.5–5.0)
Alkaline Phosphatase: 93 U/L (ref 40–150)
BILIRUBIN TOTAL: 0.87 mg/dL (ref 0.20–1.20)
BUN: 20.7 mg/dL (ref 7.0–26.0)
CO2: 26 mEq/L (ref 22–29)
CREATININE: 1.3 mg/dL — AB (ref 0.6–1.1)
Calcium: 10.4 mg/dL (ref 8.4–10.4)
Chloride: 99 mEq/L (ref 98–109)
EGFR: 39 mL/min/{1.73_m2} — ABNORMAL LOW (ref >90)
GLUCOSE: 416 mg/dL — AB (ref 70–140)
Potassium: 5 mEq/L (ref 3.5–5.1)
Sodium: 134 mEq/L — ABNORMAL LOW (ref 136–145)
TOTAL PROTEIN: 6.8 g/dL (ref 6.4–8.3)

## 2016-03-14 MED ORDER — ACETAMINOPHEN 325 MG PO TABS
ORAL_TABLET | ORAL | Status: AC
  Filled 2016-03-14: qty 2

## 2016-03-14 MED ORDER — DIPHENHYDRAMINE HCL 25 MG PO CAPS
ORAL_CAPSULE | ORAL | Status: AC
  Filled 2016-03-14: qty 2

## 2016-03-14 MED ORDER — SODIUM CHLORIDE 0.9 % IV SOLN
Freq: Once | INTRAVENOUS | Status: AC
  Administered 2016-03-14: 10:00:00 via INTRAVENOUS

## 2016-03-14 MED ORDER — ACETAMINOPHEN 325 MG PO TABS
650.0000 mg | ORAL_TABLET | Freq: Once | ORAL | Status: AC
  Administered 2016-03-14: 650 mg via ORAL

## 2016-03-14 MED ORDER — DEXAMETHASONE SODIUM PHOSPHATE 10 MG/ML IJ SOLN
10.0000 mg | Freq: Once | INTRAMUSCULAR | Status: AC
  Administered 2016-03-14: 10 mg via INTRAVENOUS

## 2016-03-14 MED ORDER — DEXAMETHASONE SODIUM PHOSPHATE 10 MG/ML IJ SOLN
INTRAMUSCULAR | Status: AC
  Filled 2016-03-14: qty 1

## 2016-03-14 MED ORDER — HEPARIN SOD (PORK) LOCK FLUSH 100 UNIT/ML IV SOLN
500.0000 [IU] | Freq: Once | INTRAVENOUS | Status: AC | PRN
  Administered 2016-03-14: 500 [IU]
  Filled 2016-03-14: qty 5

## 2016-03-14 MED ORDER — SODIUM CHLORIDE 0.9 % IV SOLN
1000.0000 mg | Freq: Once | INTRAVENOUS | Status: AC
  Administered 2016-03-14: 1000 mg via INTRAVENOUS
  Filled 2016-03-14: qty 40

## 2016-03-14 MED ORDER — DIPHENHYDRAMINE HCL 50 MG/ML IJ SOLN
50.0000 mg | Freq: Once | INTRAMUSCULAR | Status: AC
  Administered 2016-03-14: 50 mg via INTRAVENOUS

## 2016-03-14 MED ORDER — SODIUM CHLORIDE 0.9% FLUSH
10.0000 mL | INTRAVENOUS | Status: DC | PRN
  Administered 2016-03-14: 10 mL
  Filled 2016-03-14: qty 10

## 2016-03-14 MED ORDER — INSULIN REGULAR HUMAN 100 UNIT/ML IJ SOLN
10.0000 [IU] | Freq: Once | INTRAMUSCULAR | Status: AC
  Administered 2016-03-14: 10 [IU] via SUBCUTANEOUS
  Filled 2016-03-14: qty 0.1

## 2016-03-14 MED ORDER — DIPHENHYDRAMINE HCL 50 MG/ML IJ SOLN
INTRAMUSCULAR | Status: AC
  Filled 2016-03-14: qty 1

## 2016-03-14 NOTE — Patient Instructions (Signed)
[facility] Discharge Instructions for Patients Receiving Chemotherapy  Today you received the following chemotherapy agents:  Gazyva (obimutuzumab)  To help prevent nausea and vomiting after your treatment, we encourage you to take your nausea medication as prescribed.   If you develop nausea and vomiting that is not controlled by your nausea medication, call the clinic.   BELOW ARE SYMPTOMS THAT SHOULD BE REPORTED IMMEDIATELY:  *FEVER GREATER THAN 100.5 F  *CHILLS WITH OR WITHOUT FEVER  NAUSEA AND VOMITING THAT IS NOT CONTROLLED WITH YOUR NAUSEA MEDICATION  *UNUSUAL SHORTNESS OF BREATH  *UNUSUAL BRUISING OR BLEEDING  TENDERNESS IN MOUTH AND THROAT WITH OR WITHOUT PRESENCE OF ULCERS  *URINARY PROBLEMS  *BOWEL PROBLEMS  UNUSUAL RASH Items with * indicate a potential emergency and should be followed up as soon as possible.  Feel free to call the clinic you have any questions or concerns. The clinic phone number is [phone].  Please show the [facility] at check-in to the Emergency Department and triage nurse.

## 2016-03-21 ENCOUNTER — Other Ambulatory Visit: Payer: Self-pay | Admitting: Hematology and Oncology

## 2016-03-21 ENCOUNTER — Other Ambulatory Visit (HOSPITAL_BASED_OUTPATIENT_CLINIC_OR_DEPARTMENT_OTHER): Payer: Medicare Other

## 2016-03-21 ENCOUNTER — Encounter: Payer: Self-pay | Admitting: Hematology and Oncology

## 2016-03-21 ENCOUNTER — Ambulatory Visit (HOSPITAL_BASED_OUTPATIENT_CLINIC_OR_DEPARTMENT_OTHER): Payer: Medicare Other

## 2016-03-21 ENCOUNTER — Ambulatory Visit (HOSPITAL_BASED_OUTPATIENT_CLINIC_OR_DEPARTMENT_OTHER): Payer: Medicare Other | Admitting: Hematology and Oncology

## 2016-03-21 VITALS — BP 145/62 | HR 95 | Temp 98.7°F | Resp 16

## 2016-03-21 DIAGNOSIS — E1121 Type 2 diabetes mellitus with diabetic nephropathy: Secondary | ICD-10-CM

## 2016-03-21 DIAGNOSIS — Z5111 Encounter for antineoplastic chemotherapy: Secondary | ICD-10-CM

## 2016-03-21 DIAGNOSIS — I10 Essential (primary) hypertension: Secondary | ICD-10-CM | POA: Diagnosis not present

## 2016-03-21 DIAGNOSIS — C8580 Other specified types of non-Hodgkin lymphoma, unspecified site: Secondary | ICD-10-CM

## 2016-03-21 DIAGNOSIS — D61818 Other pancytopenia: Secondary | ICD-10-CM

## 2016-03-21 DIAGNOSIS — C8588 Other specified types of non-Hodgkin lymphoma, lymph nodes of multiple sites: Secondary | ICD-10-CM | POA: Diagnosis not present

## 2016-03-21 DIAGNOSIS — D6959 Other secondary thrombocytopenia: Secondary | ICD-10-CM

## 2016-03-21 DIAGNOSIS — D6481 Anemia due to antineoplastic chemotherapy: Secondary | ICD-10-CM

## 2016-03-21 LAB — COMPREHENSIVE METABOLIC PANEL
ALBUMIN: 3.5 g/dL (ref 3.5–5.0)
ALK PHOS: 90 U/L (ref 40–150)
ALT: 16 U/L (ref 0–55)
AST: 12 U/L (ref 5–34)
Anion Gap: 10 mEq/L (ref 3–11)
BILIRUBIN TOTAL: 0.76 mg/dL (ref 0.20–1.20)
BUN: 19.8 mg/dL (ref 7.0–26.0)
CALCIUM: 10.2 mg/dL (ref 8.4–10.4)
CO2: 25 mEq/L (ref 22–29)
CREATININE: 1.3 mg/dL — AB (ref 0.6–1.1)
Chloride: 102 mEq/L (ref 98–109)
EGFR: 38 mL/min/{1.73_m2} — ABNORMAL LOW (ref >90)
Glucose: 284 mg/dl — ABNORMAL HIGH (ref 70–140)
Potassium: 5 mEq/L (ref 3.5–5.1)
Sodium: 137 mEq/L (ref 136–145)
Total Protein: 6.6 g/dL (ref 6.4–8.3)

## 2016-03-21 LAB — CBC WITH DIFFERENTIAL/PLATELET
BASO%: 1.4 % (ref 0.0–2.0)
BASOS ABS: 0.1 10*3/uL (ref 0.0–0.1)
EOS%: 2.2 % (ref 0.0–7.0)
Eosinophils Absolute: 0.1 10*3/uL (ref 0.0–0.5)
HEMATOCRIT: 32.1 % — AB (ref 34.8–46.6)
HEMOGLOBIN: 10.8 g/dL — AB (ref 11.6–15.9)
LYMPH#: 0.2 10*3/uL — AB (ref 0.9–3.3)
LYMPH%: 3.6 % — ABNORMAL LOW (ref 14.0–49.7)
MCH: 28.2 pg (ref 25.1–34.0)
MCHC: 33.7 g/dL (ref 31.5–36.0)
MCV: 83.7 fL (ref 79.5–101.0)
MONO#: 0.5 10*3/uL (ref 0.1–0.9)
MONO%: 8.7 % (ref 0.0–14.0)
NEUT#: 4.6 10*3/uL (ref 1.5–6.5)
NEUT%: 84.1 % — ABNORMAL HIGH (ref 38.4–76.8)
Platelets: 140 10*3/uL — ABNORMAL LOW (ref 145–400)
RBC: 3.83 10*6/uL (ref 3.70–5.45)
RDW: 15.1 % — AB (ref 11.2–14.5)
WBC: 5.4 10*3/uL (ref 3.9–10.3)

## 2016-03-21 MED ORDER — DIPHENHYDRAMINE HCL 50 MG/ML IJ SOLN
INTRAMUSCULAR | Status: AC
  Filled 2016-03-21: qty 1

## 2016-03-21 MED ORDER — DIPHENHYDRAMINE HCL 50 MG/ML IJ SOLN
50.0000 mg | Freq: Once | INTRAMUSCULAR | Status: AC
  Administered 2016-03-21: 50 mg via INTRAVENOUS

## 2016-03-21 MED ORDER — SODIUM CHLORIDE 0.9% FLUSH
10.0000 mL | INTRAVENOUS | Status: DC | PRN
Start: 2016-03-21 — End: 2016-03-21
  Administered 2016-03-21: 10 mL
  Filled 2016-03-21: qty 10

## 2016-03-21 MED ORDER — HEPARIN SOD (PORK) LOCK FLUSH 100 UNIT/ML IV SOLN
500.0000 [IU] | Freq: Once | INTRAVENOUS | Status: AC | PRN
  Administered 2016-03-21: 500 [IU]
  Filled 2016-03-21: qty 5

## 2016-03-21 MED ORDER — INSULIN REGULAR HUMAN 100 UNIT/ML IJ SOLN
10.0000 [IU] | Freq: Once | INTRAMUSCULAR | Status: AC
  Administered 2016-03-21: 10 [IU] via SUBCUTANEOUS
  Filled 2016-03-21: qty 0.1

## 2016-03-21 MED ORDER — ACETAMINOPHEN 325 MG PO TABS
ORAL_TABLET | ORAL | Status: AC
  Filled 2016-03-21: qty 2

## 2016-03-21 MED ORDER — DEXAMETHASONE SODIUM PHOSPHATE 10 MG/ML IJ SOLN
10.0000 mg | Freq: Once | INTRAMUSCULAR | Status: AC
  Administered 2016-03-21: 10 mg via INTRAVENOUS

## 2016-03-21 MED ORDER — SODIUM CHLORIDE 0.9 % IV SOLN
Freq: Once | INTRAVENOUS | Status: AC
  Administered 2016-03-21: 10:00:00 via INTRAVENOUS

## 2016-03-21 MED ORDER — SODIUM CHLORIDE 0.9 % IV SOLN
1000.0000 mg | Freq: Once | INTRAVENOUS | Status: AC
  Administered 2016-03-21: 1000 mg via INTRAVENOUS
  Filled 2016-03-21: qty 40

## 2016-03-21 MED ORDER — ACETAMINOPHEN 325 MG PO TABS
650.0000 mg | ORAL_TABLET | Freq: Once | ORAL | Status: AC
  Administered 2016-03-21: 650 mg via ORAL

## 2016-03-21 MED ORDER — DEXAMETHASONE SODIUM PHOSPHATE 10 MG/ML IJ SOLN
INTRAMUSCULAR | Status: AC
  Filled 2016-03-21: qty 1

## 2016-03-21 NOTE — Progress Notes (Signed)
[facility] OFFICE PROGRESS NOTE  Patient Care Team: Aretta Nip, MD as PCP - General (Family Medicine) Sharyne Peach, MD as Consulting Physician (Ophthalmology) Inda Castle, MD as Consulting Physician (Gastroenterology) Theodis Sato, MD (Inactive) as Consulting Physician (Orthopedic Surgery) Jannette Spanner, MD as Referring Physician (Dermatology) Heath Lark, MD as Consulting Physician (Hematology and Oncology) Alphonsa Overall, MD as Consulting Physician (General Surgery)  SUMMARY OF ONCOLOGIC HISTORY: Oncology History   Marginal zone lymphoma, with lymph node and bone marrow involvement along with splenomegaly   Primary site: Lymphoid Neoplasms   Staging method: AJCC 6th Edition   Clinical: Stage IV signed by Heath Lark, MD on 09/02/2013  7:38 PM   Summary: Stage IV       Marginal zone lymphoma ([place])   07/29/2013 Imaging    CT scan shows splenomegaly and abnormal lymphadenopathy.      08/13/2013 Imaging    PET CT scan confirmed lymphadenopathy and splenomegaly, those areas are PET avid      08/23/2013 Surgery    [id number] left axillary lymph node biopsy confirmed a low-grade lymphoma.      08/27/2013 Bone Marrow Biopsy    Bone marrow biopsy confirmed lymphoma.[id number]      09/08/2013 - 09/29/2013 Chemotherapy    She completed 4 cycles of rituximab with resolution of splenomegaly.      12/29/2013 Imaging    PET scan show complete response.      07/11/2014 Imaging    PET scan showed recurrent disease      07/19/2014 - 08/09/2014 Chemotherapy    She completed 4 cycles of rituximab with resolution of splenomegaly      10/12/2014 Imaging    Repeat PET/CT scan showed near complete response to treatment.      10/13/2014 - 02/09/2015 Chemotherapy    She is placed on rituximab maintenance therapy.      03/29/2015 Imaging    PET CT showed possible mild progression of splenomegaly      04/26/2015 - 02/22/2016 Chemotherapy    She started taking  Ibrutinib      04/28/2015 Adverse Reaction    Treatment was placed temporarily on hold due to severe diarrhea and resumed at reduced dose      07/03/2015 PET scan    No hypermetabolic adenopathy in the neck, chest, abdomen or pelvis.2. Spleen is not hypermetabolic and appears slightly less prominent size.      02/06/2016 PET scan    Hypermetabolic LEFT axillary lymph nodes. Concern for HIGH-GRADE LYMPHOMA LOCALIZED RECURRENCE. 2. Prominent spleen without hypermetabolic activity. No change in size from comparison. 3. No additional hypermetabolic adenopathy on scan.      02/19/2016 Procedure    Technically successful ultrasound-guided core biopsy, left axillary adenopathy.      02/19/2016 Pathology Results    Lymph node, needle/core biopsy, left axilla - ATYPICAL LYMPHOID PROLIFERATION SUSPICIOUS FOR LYMPHOMA. - SEE COMMENT. Microscopic Comment The sections show multiple very small and skinny fragments of lymph nodal tissue displaying areas of lymphoid expansion by a population of small to medium size lymphocytes with round to irregular nuclei, scanty to moderately abundant cytoplasm, dense chromatin and inconspicuous nucleoli. This is associated with scattering of "naked" germinal centers with abundant tingible body macrophages. To further evaluate this process, flow cytometric analysis was performed and shows a minor population of monoclonal B cells expressing pan B-cell antigens including CD20 with associated kappa light chain restriction. In addition, immunohistochemical stains were performed including CD20, CD79a, CD3, CD5, CD10, BCL-2, CD21  with appropriate controls. The stains show a mixture of T and B cells with slight predominance of B cells. CD21 highlights numerous areas with dendritic networks, correlating with the presence of germinal centers. CD10 shows focal positivity likely in germinal centers. BCL-2 is diffusely positive except in areas of apparent germinal centers. The overall  features are atypical and suspicious for involvement by a B-cell lymphoproliferative process, particularly given the B-cell monoclonality by flow cytometry. However, morphologic evaluation is extremely limited due to small biopsy fragments and additional material (excisional biopsy) is strongly recommended for further evaluation. (BNS:ecj 02/21/2016)       03/04/2016 Procedure    Placement of single lumen port a cath via right internal jugular vein. The catheter tip lies at the cavo-atrial junction. A power injectable port a cath was placed and is ready for immediate use.      03/07/2016 -  Chemotherapy    She received treatment with bendamustine and Gazyva       INTERVAL HISTORY: Please see below for problem oriented charting. She is seen in the infusion room during treatment. She feels well. She denies mucositis, nausea or vomiting. She denies recent headache with hypertension.The patient denies any recent signs or symptoms of bleeding such as spontaneous epistaxis, hematuria or hematochezia. No recent infection no new lymphadenopathy  REVIEW OF SYSTEMS:   Constitutional: Denies fevers, chills or abnormal weight loss Eyes: Denies blurriness of vision Ears, nose, mouth, throat, and face: Denies mucositis or sore throat Respiratory: Denies cough, dyspnea or wheezes Cardiovascular: Denies palpitation, chest discomfort or lower extremity swelling Gastrointestinal:  Denies nausea, heartburn or change in bowel habits Skin: Denies abnormal skin rashes Lymphatics: Denies new lymphadenopathy or easy bruising Neurological:Denies numbness, tingling or new weaknesses Behavioral/Psych: Mood is stable, no new changes  All other systems were reviewed with the patient and are negative.  I have reviewed the past medical history, past surgical history, social history and family history with the patient and they are unchanged from previous note.  ALLERGIES:  is allergic to aspirin; capoten  [captopril]; levemir [insulin detemir]; micronase [glyburide]; onglyza [saxagliptin]; statins; and zocor [simvastatin].  MEDICATIONS:  Current Outpatient Prescriptions  Medication Sig Dispense Refill  . acyclovir (ZOVIRAX) 400 MG tablet Take 1 tablet (400 mg total) by mouth daily. 30 tablet 5  . allopurinol (ZYLOPRIM) 300 MG tablet Take 1 tablet (300 mg total) by mouth daily. 30 tablet 0  . amLODipine (NORVASC) 10 MG tablet Take 1 tablet (10 mg total) by mouth at bedtime. 30 tablet 3  . aspirin 81 MG tablet Take 40.5 mg by mouth 3 (three) times a week.    . Calcium Carbonate-Vit D-Min (CALCIUM 1200 PO) Take 1 tablet by mouth daily.    . cholecalciferol (VITAMIN D) 1000 UNITS tablet Take 1,000 Units by mouth daily.     . Cinnamon 500 MG capsule Take 500 mg by mouth daily.    . enalapril (VASOTEC) 20 MG tablet Take 20 mg by mouth every morning.    . fish oil-omega-3 fatty acids 1000 MG capsule Take 1 g by mouth daily.     Marland Kitchen glimepiride (AMARYL) 4 MG tablet Take 4 mg by mouth daily with breakfast.    . glucose blood test strip Use as instructed 100 each 12  . ibrutinib (IMBRUVICA) 140 MG capsul Take 2 capsules (280 mg total) by mouth daily. 60 capsule 8  . lidocaine-prilocaine (EMLA) cream Apply 1 application topically as needed. 30 g 3  . loperamide (IMODIUM) 2 MG capsule  Take by mouth as needed for diarrhea or loose stools.    Marland Kitchen LORazepam (ATIVAN) 0.5 MG tablet Take 1 tablet (0.5 mg total) by mouth every 8 (eight) hours as needed for anxiety. 30 tablet 0  . Magnesium 500 MG CAPS Take 500 mg by mouth daily.     . metFORMIN (GLUCOPHAGE) 1000 MG tablet Take 1,000 mg by mouth 2 (two) times daily with a meal.     . Misc Natural Products (LUTEIN 20 PO) Take by mouth daily.    . Multiple Vitamins-Minerals (CENTRUM CARDIO) TABS Take 1 tablet by mouth daily.    . Multiple Vitamins-Minerals (ICAPS) CAPS Take by mouth daily.    . ondansetron (ZOFRAN) 8 MG tablet Take 1 tablet (8 mg total) by mouth every  8 (eight) hours as needed for nausea. 30 tablet 1  . prochlorperazine (COMPAZINE) 10 MG tablet Take 1 tablet (10 mg total) by mouth every 6 (six) hours as needed (Nausea or vomiting). 30 tablet 1  . Red Yeast Rice 600 MG TABS Take 600 mg by mouth daily.      No current facility-administered medications for this visit.    Facility-Administered Medications Ordered in Other Visits  Medication Dose Route Frequency Provider Last Rate Last Dose  . 0.9 %  sodium chloride infusion   Intravenous Once Heath Lark, MD      . heparin lock flush 100 unit/mL  500 Units Intracatheter Once PRN Heath Lark, MD      . sodium chloride flush (NS) 0.9 % injection 10 mL  10 mL Intracatheter PRN Heath Lark, MD        PHYSICAL EXAMINATION: ECOG PERFORMANCE STATUS: 1 - Symptomatic but completely ambulatory GENERAL:alert, no distress and comfortable SKIN: skin color, texture, turgor are normal, no rashes or significant lesions EYES: normal, Conjunctiva are pink and non-injected, sclera clear OROPHARYNX:no exudate, no erythema and lips, buccal mucosa, and tongue normal  NECK: supple, thyroid normal size, non-tender, without nodularity LYMPH:  no palpable lymphadenopathy in the cervical, axillary or inguinal LUNGS: clear to auscultation and percussion with normal breathing effort HEART: regular rate & rhythm and no murmurs and no lower extremity edema ABDOMEN:abdomen soft, non-tender and normal bowel sounds Musculoskeletal:no cyanosis of digits and no clubbing  NEURO: alert & oriented x 3 with fluent speech, no focal motor/sensory deficits  LABORATORY DATA:  I have reviewed the data as listed    Component Value Date/Time   NA 137 03/21/2016 0844   K 5.0 03/21/2016 0844   CL 106 02/19/2016 1120   CO2 25 03/21/2016 0844   GLUCOSE 284 (H) 03/21/2016 0844   BUN 19.8 03/21/2016 0844   CREATININE 1.3 (H) 03/21/2016 0844   CALCIUM 10.2 03/21/2016 0844   PROT 6.6 03/21/2016 0844   ALBUMIN 3.5 03/21/2016 0844    AST 12 03/21/2016 0844   ALT 16 03/21/2016 0844   ALKPHOS 90 03/21/2016 0844   BILITOT 0.76 03/21/2016 0844   GFRNONAA >60 02/19/2016 1120   GFRNONAA 47 (L) 05/18/2013 1256   GFRAA >60 02/19/2016 1120   GFRAA 55 (L) 05/18/2013 1256    No results found for: SPEP, UPEP  Lab Results  Component Value Date   WBC 5.4 03/21/2016   NEUTROABS 4.6 03/21/2016   HGB 10.8 (L) 03/21/2016   HCT 32.1 (L) 03/21/2016   MCV 83.7 03/21/2016   PLT 140 (L) 03/21/2016      Chemistry      Component Value Date/Time   NA 137 03/21/2016 0844  K 5.0 03/21/2016 0844   CL 106 02/19/2016 1120   CO2 25 03/21/2016 0844   BUN 19.8 03/21/2016 0844   CREATININE 1.3 (H) 03/21/2016 0844   GLU 202 (H) 08/09/2014 1000      Component Value Date/Time   CALCIUM 10.2 03/21/2016 0844   ALKPHOS 90 03/21/2016 0844   AST 12 03/21/2016 0844   ALT 16 03/21/2016 0844   BILITOT 0.76 03/21/2016 0844       RADIOGRAPHIC STUDIES: I have personally reviewed the radiological images as listed and agreed with the findings in the report. Ir US Guide Vasc Access Right  Result Date: 03/04/2016 CLINICAL DATA:  B-cell lymphoma and need for porta cath to begin chemotherapy. EXAM: IMPLANTED PORT A CATH PLACEMENT WITH ULTRASOUND AND FLUOROSCOPIC GUIDANCE ANESTHESIA/SEDATION: 2.0 mg IV Versed; 100 mcg IV Fentanyl Total Moderate Sedation Time:  33 minutes The patient's level of consciousness and physiologic status were continuously monitored during the procedure by Radiology nursing. Additional Medications: 2 g IV Ancef. As antibiotic prophylaxis, Ancef was ordered pre-procedure and administered intravenously within one hour of incision. FLUOROSCOPY TIME:  1 minutes and 6 seconds.  7.5 mGy. PROCEDURE: The procedure, risks, benefits, and alternatives were explained to the patient. Questions regarding the procedure were encouraged and answered. The patient understands and consents to the procedure. A time-out was performed prior to  initiating the procedure. Ultrasound was utilized to confirm patency of the right internal jugular vein. The right neck and chest were prepped with chlorhexidine in a sterile fashion, and a sterile drape was applied covering the operative field. Maximum barrier sterile technique with sterile gowns and gloves were used for the procedure. Local anesthesia was provided with 1% lidocaine. After creating a small venotomy incision, a 21 gauge needle was advanced into the right internal jugular vein under direct, real-time ultrasound guidance. Ultrasound image documentation was performed. After securing guidewire access, an 8 Fr dilator was placed. A J-wire was kinked to measure appropriate catheter length. A subcutaneous port pocket was then created along the upper chest wall utilizing sharp and blunt dissection. Portable cautery was utilized. The pocket was irrigated with sterile saline. A single lumen power injectable port was chosen for placement. The 8 Fr catheter was tunneled from the port pocket site to the venotomy incision. The port was placed in the pocket. External catheter was trimmed to appropriate length based on guidewire measurement. At the venotomy, an 8 Fr peel-away sheath was placed over a guidewire. The catheter was then placed through the sheath and the sheath removed. Final catheter positioning was confirmed and documented with a fluoroscopic spot image. The port was accessed with a needle and aspirated and flushed with heparinized saline. The access needle was removed. The venotomy and port pocket incisions were closed with subcutaneous 3-0 Monocryl and subcuticular 4-0 Vicryl. Dermabond was applied to both incisions. COMPLICATIONS: COMPLICATIONS None FINDINGS: After catheter placement, the tip lies at the cavo-atrial junction. The catheter aspirates normally and is ready for immediate use. IMPRESSION: Placement of single lumen port a cath via right internal jugular vein. The catheter tip lies at the  cavo-atrial junction. A power injectable port a cath was placed and is ready for immediate use. Electronically Signed   By: Aletta Edouard M.D.   On: 03/04/2016 15:43   Ir Fluoro Guide Port Insertion Right  Result Date: 03/04/2016 CLINICAL DATA:  B-cell lymphoma and need for porta cath to begin chemotherapy. EXAM: IMPLANTED PORT A CATH PLACEMENT WITH ULTRASOUND AND FLUOROSCOPIC GUIDANCE ANESTHESIA/SEDATION: 2.0  mg IV Versed; 100 mcg IV Fentanyl Total Moderate Sedation Time:  33 minutes The patient's level of consciousness and physiologic status were continuously monitored during the procedure by Radiology nursing. Additional Medications: 2 g IV Ancef. As antibiotic prophylaxis, Ancef was ordered pre-procedure and administered intravenously within one hour of incision. FLUOROSCOPY TIME:  1 minutes and 6 seconds.  7.5 mGy. PROCEDURE: The procedure, risks, benefits, and alternatives were explained to the patient. Questions regarding the procedure were encouraged and answered. The patient understands and consents to the procedure. A time-out was performed prior to initiating the procedure. Ultrasound was utilized to confirm patency of the right internal jugular vein. The right neck and chest were prepped with chlorhexidine in a sterile fashion, and a sterile drape was applied covering the operative field. Maximum barrier sterile technique with sterile gowns and gloves were used for the procedure. Local anesthesia was provided with 1% lidocaine. After creating a small venotomy incision, a 21 gauge needle was advanced into the right internal jugular vein under direct, real-time ultrasound guidance. Ultrasound image documentation was performed. After securing guidewire access, an 8 Fr dilator was placed. A J-wire was kinked to measure appropriate catheter length. A subcutaneous port pocket was then created along the upper chest wall utilizing sharp and blunt dissection. Portable cautery was utilized. The pocket was  irrigated with sterile saline. A single lumen power injectable port was chosen for placement. The 8 Fr catheter was tunneled from the port pocket site to the venotomy incision. The port was placed in the pocket. External catheter was trimmed to appropriate length based on guidewire measurement. At the venotomy, an 8 Fr peel-away sheath was placed over a guidewire. The catheter was then placed through the sheath and the sheath removed. Final catheter positioning was confirmed and documented with a fluoroscopic spot image. The port was accessed with a needle and aspirated and flushed with heparinized saline. The access needle was removed. The venotomy and port pocket incisions were closed with subcutaneous 3-0 Monocryl and subcuticular 4-0 Vicryl. Dermabond was applied to both incisions. COMPLICATIONS: COMPLICATIONS None FINDINGS: After catheter placement, the tip lies at the cavo-atrial junction. The catheter aspirates normally and is ready for immediate use. IMPRESSION: Placement of single lumen port a cath via right internal jugular vein. The catheter tip lies at the cavo-atrial junction. A power injectable port a cath was placed and is ready for immediate use. Electronically Signed   By: Aletta Edouard M.D.   On: 03/04/2016 15:43    ASSESSMENT & PLAN:  Marginal zone lymphoma She tolerated treatment very well except for hypertension and hyperglycemia. We will continue treatment without dose adjustment. I plan to repeat imaging after 3 cycles of treatment  Essential hypertension Her blood pressure is poorly controlled.  I have added amlodipine. It is better. Continue same treatment for now  Type 2 diabetes mellitus with diabetic nephropathy She has poorly controlled diabetes.  Plan to add regular insulin during treatment.  Pancytopenia, acquired ([place]) The mild anemia and thrombocytopenia related to treatment. She is not symptomatic. We will continue treatment without dose adjustment. She does  not require blood transfusion.   No orders of the defined types were placed in this encounter.  All questions were answered. The patient knows to call the clinic with any problems, questions or concerns. No barriers to learning was detected. I spent 20 minutes counseling the patient face to face. The total time spent in the appointment was 30 minutes and more than 50% was on counseling  and review of test results     Heath Lark, MD 03/21/2016 1:34 PM

## 2016-03-21 NOTE — Assessment & Plan Note (Signed)
She has poorly controlled diabetes.  Plan to add regular insulin during treatment. 

## 2016-03-21 NOTE — Assessment & Plan Note (Signed)
Her blood pressure is poorly controlled.  I have added amlodipine. It is better. Continue same treatment for now

## 2016-03-21 NOTE — Patient Instructions (Signed)
[facility] Discharge Instructions for Patients Receiving Chemotherapy  Today you received the following chemotherapy agents:  Gazyva (obimutuzumab)  To help prevent nausea and vomiting after your treatment, we encourage you to take your nausea medication as prescribed.   If you develop nausea and vomiting that is not controlled by your nausea medication, call the clinic.   BELOW ARE SYMPTOMS THAT SHOULD BE REPORTED IMMEDIATELY:  *FEVER GREATER THAN 100.5 F  *CHILLS WITH OR WITHOUT FEVER  NAUSEA AND VOMITING THAT IS NOT CONTROLLED WITH YOUR NAUSEA MEDICATION  *UNUSUAL SHORTNESS OF BREATH  *UNUSUAL BRUISING OR BLEEDING  TENDERNESS IN MOUTH AND THROAT WITH OR WITHOUT PRESENCE OF ULCERS  *URINARY PROBLEMS  *BOWEL PROBLEMS  UNUSUAL RASH Items with * indicate a potential emergency and should be followed up as soon as possible.  Feel free to call the clinic you have any questions or concerns. The clinic phone number is [phone].  Please show the [facility] at check-in to the Emergency Department and triage nurse.

## 2016-03-21 NOTE — Assessment & Plan Note (Signed)
She tolerated treatment very well except for hypertension and hyperglycemia. We will continue treatment without dose adjustment. I plan to repeat imaging after 3 cycles of treatment 

## 2016-03-21 NOTE — Assessment & Plan Note (Signed)
The mild anemia and thrombocytopenia related to treatment. She is not symptomatic. We will continue treatment without dose adjustment. She does not require blood transfusion. 

## 2016-04-01 ENCOUNTER — Other Ambulatory Visit (HOSPITAL_BASED_OUTPATIENT_CLINIC_OR_DEPARTMENT_OTHER): Payer: Self-pay | Admitting: Family Medicine

## 2016-04-01 DIAGNOSIS — Z1231 Encounter for screening mammogram for malignant neoplasm of breast: Secondary | ICD-10-CM

## 2016-04-02 ENCOUNTER — Other Ambulatory Visit (HOSPITAL_BASED_OUTPATIENT_CLINIC_OR_DEPARTMENT_OTHER): Payer: Self-pay | Admitting: Family Medicine

## 2016-04-02 ENCOUNTER — Ambulatory Visit (HOSPITAL_BASED_OUTPATIENT_CLINIC_OR_DEPARTMENT_OTHER)
Admission: RE | Admit: 2016-04-02 | Discharge: 2016-04-02 | Disposition: A | Discharge status: Home / Self Care | Payer: Medicare Other | Source: Ambulatory Visit | Attending: Family Medicine | Admitting: Family Medicine

## 2016-04-02 ENCOUNTER — Encounter (HOSPITAL_BASED_OUTPATIENT_CLINIC_OR_DEPARTMENT_OTHER): Payer: Self-pay

## 2016-04-02 DIAGNOSIS — Z1231 Encounter for screening mammogram for malignant neoplasm of breast: Secondary | ICD-10-CM

## 2016-04-04 ENCOUNTER — Ambulatory Visit (HOSPITAL_BASED_OUTPATIENT_CLINIC_OR_DEPARTMENT_OTHER): Payer: Medicare Other

## 2016-04-04 ENCOUNTER — Ambulatory Visit (HOSPITAL_BASED_OUTPATIENT_CLINIC_OR_DEPARTMENT_OTHER): Payer: Medicare Other | Admitting: Hematology and Oncology

## 2016-04-04 ENCOUNTER — Other Ambulatory Visit (HOSPITAL_BASED_OUTPATIENT_CLINIC_OR_DEPARTMENT_OTHER): Payer: Medicare Other

## 2016-04-04 VITALS — BP 148/59 | HR 95 | Temp 97.8°F | Resp 16

## 2016-04-04 DIAGNOSIS — D696 Thrombocytopenia, unspecified: Secondary | ICD-10-CM | POA: Diagnosis not present

## 2016-04-04 DIAGNOSIS — E1121 Type 2 diabetes mellitus with diabetic nephropathy: Secondary | ICD-10-CM | POA: Diagnosis not present

## 2016-04-04 DIAGNOSIS — C8588 Other specified types of non-Hodgkin lymphoma, lymph nodes of multiple sites: Secondary | ICD-10-CM

## 2016-04-04 DIAGNOSIS — Z5112 Encounter for antineoplastic immunotherapy: Secondary | ICD-10-CM | POA: Diagnosis not present

## 2016-04-04 DIAGNOSIS — I10 Essential (primary) hypertension: Secondary | ICD-10-CM | POA: Diagnosis not present

## 2016-04-04 DIAGNOSIS — C8580 Other specified types of non-Hodgkin lymphoma, unspecified site: Secondary | ICD-10-CM

## 2016-04-04 DIAGNOSIS — D61818 Other pancytopenia: Secondary | ICD-10-CM

## 2016-04-04 DIAGNOSIS — D649 Anemia, unspecified: Secondary | ICD-10-CM

## 2016-04-04 DIAGNOSIS — Z5111 Encounter for antineoplastic chemotherapy: Secondary | ICD-10-CM | POA: Diagnosis not present

## 2016-04-04 LAB — COMPREHENSIVE METABOLIC PANEL
ALBUMIN: 3.4 g/dL — AB (ref 3.5–5.0)
ALT: 19 U/L (ref 0–55)
AST: 19 U/L (ref 5–34)
Alkaline Phosphatase: 80 U/L (ref 40–150)
Anion Gap: 9 mEq/L (ref 3–11)
BUN: 20.4 mg/dL (ref 7.0–26.0)
CALCIUM: 9.3 mg/dL (ref 8.4–10.4)
CHLORIDE: 104 meq/L (ref 98–109)
CO2: 24 mEq/L (ref 22–29)
CREATININE: 1.1 mg/dL (ref 0.6–1.1)
EGFR: 46 mL/min/{1.73_m2} — ABNORMAL LOW (ref >90)
GLUCOSE: 309 mg/dL — AB (ref 70–140)
Potassium: 4.3 mEq/L (ref 3.5–5.1)
SODIUM: 137 meq/L (ref 136–145)
Total Bilirubin: 0.43 mg/dL (ref 0.20–1.20)
Total Protein: 6.2 g/dL — ABNORMAL LOW (ref 6.4–8.3)

## 2016-04-04 LAB — CBC WITH DIFFERENTIAL/PLATELET
BASO%: 0.5 % (ref 0.0–2.0)
Basophils Absolute: 0 10*3/uL (ref 0.0–0.1)
EOS%: 1 % (ref 0.0–7.0)
Eosinophils Absolute: 0 10*3/uL (ref 0.0–0.5)
HEMATOCRIT: 29 % — AB (ref 34.8–46.6)
HEMOGLOBIN: 9.9 g/dL — AB (ref 11.6–15.9)
LYMPH#: 0.4 10*3/uL — AB (ref 0.9–3.3)
LYMPH%: 9.8 % — ABNORMAL LOW (ref 14.0–49.7)
MCH: 28.4 pg (ref 25.1–34.0)
MCHC: 34.1 g/dL (ref 31.5–36.0)
MCV: 83.3 fL (ref 79.5–101.0)
MONO#: 0.3 10*3/uL (ref 0.1–0.9)
MONO%: 7.8 % (ref 0.0–14.0)
NEUT%: 80.9 % — ABNORMAL HIGH (ref 38.4–76.8)
NEUTROS ABS: 3.2 10*3/uL (ref 1.5–6.5)
Platelets: 106 10*3/uL — ABNORMAL LOW (ref 145–400)
RBC: 3.48 10*6/uL — ABNORMAL LOW (ref 3.70–5.45)
RDW: 15.7 % — AB (ref 11.2–14.5)
WBC: 4 10*3/uL (ref 3.9–10.3)
nRBC: 0 % (ref 0–0)

## 2016-04-04 MED ORDER — SODIUM CHLORIDE 0.9% FLUSH
10.0000 mL | INTRAVENOUS | Status: DC | PRN
  Administered 2016-04-04: 10 mL
  Filled 2016-04-04: qty 10

## 2016-04-04 MED ORDER — SODIUM CHLORIDE 0.9 % IV SOLN
84.0000 mg/m2 | Freq: Once | INTRAVENOUS | Status: AC
  Administered 2016-04-04: 150 mg via INTRAVENOUS
  Filled 2016-04-04: qty 20

## 2016-04-04 MED ORDER — HEPARIN SOD (PORK) LOCK FLUSH 100 UNIT/ML IV SOLN
500.0000 [IU] | Freq: Once | INTRAVENOUS | Status: AC | PRN
  Administered 2016-04-04: 500 [IU]
  Filled 2016-04-04: qty 5

## 2016-04-04 MED ORDER — PALONOSETRON HCL INJECTION 0.25 MG/5ML
0.2500 mg | Freq: Once | INTRAVENOUS | Status: AC
Start: 2016-04-04 — End: 2016-04-04
  Administered 2016-04-04: 0.25 mg via INTRAVENOUS

## 2016-04-04 MED ORDER — DEXAMETHASONE SODIUM PHOSPHATE 10 MG/ML IJ SOLN
INTRAMUSCULAR | Status: AC
Start: 2016-04-04 — End: 2016-04-04
  Filled 2016-04-04: qty 1

## 2016-04-04 MED ORDER — OBINUTUZUMAB CHEMO INJECTION 1000 MG/40ML
1000.0000 mg | Freq: Once | INTRAVENOUS | Status: AC
  Administered 2016-04-04: 1000 mg via INTRAVENOUS
  Filled 2016-04-04: qty 40

## 2016-04-04 MED ORDER — ACETAMINOPHEN 325 MG PO TABS
ORAL_TABLET | ORAL | Status: AC
  Filled 2016-04-04: qty 2

## 2016-04-04 MED ORDER — INSULIN REGULAR HUMAN 100 UNIT/ML IJ SOLN
10.0000 [IU] | Freq: Once | INTRAMUSCULAR | Status: AC
  Administered 2016-04-04: 10 [IU] via SUBCUTANEOUS
  Filled 2016-04-04: qty 0.1

## 2016-04-04 MED ORDER — DEXAMETHASONE SODIUM PHOSPHATE 10 MG/ML IJ SOLN
10.0000 mg | Freq: Once | INTRAMUSCULAR | Status: AC
  Administered 2016-04-04: 10 mg via INTRAVENOUS

## 2016-04-04 MED ORDER — DIPHENHYDRAMINE HCL 50 MG/ML IJ SOLN
INTRAMUSCULAR | Status: AC
  Filled 2016-04-04: qty 1

## 2016-04-04 MED ORDER — PALONOSETRON HCL INJECTION 0.25 MG/5ML
INTRAVENOUS | Status: AC
  Filled 2016-04-04: qty 20

## 2016-04-04 MED ORDER — ACETAMINOPHEN 325 MG PO TABS
650.0000 mg | ORAL_TABLET | Freq: Once | ORAL | Status: AC
Start: 2016-04-04 — End: 2016-04-04
  Administered 2016-04-04: 650 mg via ORAL

## 2016-04-04 MED ORDER — DIPHENHYDRAMINE HCL 50 MG/ML IJ SOLN
50.0000 mg | Freq: Once | INTRAMUSCULAR | Status: AC
  Administered 2016-04-04: 50 mg via INTRAVENOUS

## 2016-04-04 NOTE — Patient Instructions (Signed)
[facility] Discharge Instructions for Patients Receiving Chemotherapy  Today you received the following chemotherapy agents:  Gazyva and bendamustine.  To help prevent nausea and vomiting after your treatment, we encourage you to take your nausea medication as directed.   If you develop nausea and vomiting that is not controlled by your nausea medication, call the clinic.   BELOW ARE SYMPTOMS THAT SHOULD BE REPORTED IMMEDIATELY:  *FEVER GREATER THAN 100.5 F  *CHILLS WITH OR WITHOUT FEVER  NAUSEA AND VOMITING THAT IS NOT CONTROLLED WITH YOUR NAUSEA MEDICATION  *UNUSUAL SHORTNESS OF BREATH  *UNUSUAL BRUISING OR BLEEDING  TENDERNESS IN MOUTH AND THROAT WITH OR WITHOUT PRESENCE OF ULCERS  *URINARY PROBLEMS  *BOWEL PROBLEMS  UNUSUAL RASH Items with * indicate a potential emergency and should be followed up as soon as possible.  Feel free to call the clinic you have any questions or concerns. The clinic phone number is [phone].  Please show the [facility] at check-in to the Emergency Department and triage nurse.

## 2016-04-05 ENCOUNTER — Encounter: Payer: Self-pay | Admitting: Hematology and Oncology

## 2016-04-05 ENCOUNTER — Ambulatory Visit (HOSPITAL_BASED_OUTPATIENT_CLINIC_OR_DEPARTMENT_OTHER): Payer: Medicare Other

## 2016-04-05 VITALS — BP 164/76 | HR 89 | Temp 98.1°F | Resp 16

## 2016-04-05 DIAGNOSIS — C8588 Other specified types of non-Hodgkin lymphoma, lymph nodes of multiple sites: Secondary | ICD-10-CM

## 2016-04-05 DIAGNOSIS — Z5111 Encounter for antineoplastic chemotherapy: Secondary | ICD-10-CM

## 2016-04-05 DIAGNOSIS — C8580 Other specified types of non-Hodgkin lymphoma, unspecified site: Secondary | ICD-10-CM

## 2016-04-05 MED ORDER — DEXAMETHASONE SODIUM PHOSPHATE 10 MG/ML IJ SOLN
INTRAMUSCULAR | Status: AC
  Filled 2016-04-05: qty 1

## 2016-04-05 MED ORDER — BENDAMUSTINE HCL (LYOPHILIZED PWD) CHEMO INJECTION 100MG
82.0000 mg/m2 | Freq: Once | INTRAVENOUS | Status: AC
  Administered 2016-04-05: 150 mg via INTRAVENOUS
  Filled 2016-04-05: qty 20

## 2016-04-05 MED ORDER — HEPARIN SOD (PORK) LOCK FLUSH 100 UNIT/ML IV SOLN
500.0000 [IU] | Freq: Once | INTRAVENOUS | Status: AC | PRN
  Administered 2016-04-05: 500 [IU]
  Filled 2016-04-05: qty 5

## 2016-04-05 MED ORDER — DEXAMETHASONE SODIUM PHOSPHATE 10 MG/ML IJ SOLN
10.0000 mg | Freq: Once | INTRAMUSCULAR | Status: AC
  Administered 2016-04-05: 10 mg via INTRAVENOUS

## 2016-04-05 MED ORDER — SODIUM CHLORIDE 0.9 % IV SOLN
Freq: Once | INTRAVENOUS | Status: AC
  Administered 2016-04-05: 13:00:00 via INTRAVENOUS

## 2016-04-05 MED ORDER — SODIUM CHLORIDE 0.9% FLUSH
10.0000 mL | INTRAVENOUS | Status: DC | PRN
  Administered 2016-04-05: 10 mL
  Filled 2016-04-05: qty 10

## 2016-04-05 NOTE — Progress Notes (Signed)
[facility] OFFICE PROGRESS NOTE  Patient Care Team: Aretta Nip, MD as PCP - General (Family Medicine) Sharyne Peach, MD as Consulting Physician (Ophthalmology) Inda Castle, MD as Consulting Physician (Gastroenterology) Theodis Sato, MD (Inactive) as Consulting Physician (Orthopedic Surgery) Jannette Spanner, MD as Referring Physician (Dermatology) Heath Lark, MD as Consulting Physician (Hematology and Oncology) Alphonsa Overall, MD as Consulting Physician (General Surgery)  SUMMARY OF ONCOLOGIC HISTORY: Oncology History   Marginal zone lymphoma, with lymph node and bone marrow involvement along with splenomegaly   Primary site: Lymphoid Neoplasms   Staging method: AJCC 6th Edition   Clinical: Stage IV signed by Heath Lark, MD on 09/02/2013  7:38 PM   Summary: Stage IV       Marginal zone lymphoma ([place])   07/29/2013 Imaging    CT scan shows splenomegaly and abnormal lymphadenopathy.      08/13/2013 Imaging    PET CT scan confirmed lymphadenopathy and splenomegaly, those areas are PET avid      08/23/2013 Surgery    [id number] left axillary lymph node biopsy confirmed a low-grade lymphoma.      08/27/2013 Bone Marrow Biopsy    Bone marrow biopsy confirmed lymphoma.[id number]      09/08/2013 - 09/29/2013 Chemotherapy    She completed 4 cycles of rituximab with resolution of splenomegaly.      12/29/2013 Imaging    PET scan show complete response.      07/11/2014 Imaging    PET scan showed recurrent disease      07/19/2014 - 08/09/2014 Chemotherapy    She completed 4 cycles of rituximab with resolution of splenomegaly      10/12/2014 Imaging    Repeat PET/CT scan showed near complete response to treatment.      10/13/2014 - 02/09/2015 Chemotherapy    She is placed on rituximab maintenance therapy.      03/29/2015 Imaging    PET CT showed possible mild progression of splenomegaly      04/26/2015 - 02/22/2016 Chemotherapy    She started taking  Ibrutinib      04/28/2015 Adverse Reaction    Treatment was placed temporarily on hold due to severe diarrhea and resumed at reduced dose      07/03/2015 PET scan    No hypermetabolic adenopathy in the neck, chest, abdomen or pelvis.2. Spleen is not hypermetabolic and appears slightly less prominent size.      02/06/2016 PET scan    Hypermetabolic LEFT axillary lymph nodes. Concern for HIGH-GRADE LYMPHOMA LOCALIZED RECURRENCE. 2. Prominent spleen without hypermetabolic activity. No change in size from comparison. 3. No additional hypermetabolic adenopathy on scan.      02/19/2016 Procedure    Technically successful ultrasound-guided core biopsy, left axillary adenopathy.      02/19/2016 Pathology Results    Lymph node, needle/core biopsy, left axilla - ATYPICAL LYMPHOID PROLIFERATION SUSPICIOUS FOR LYMPHOMA. - SEE COMMENT. Microscopic Comment The sections show multiple very small and skinny fragments of lymph nodal tissue displaying areas of lymphoid expansion by a population of small to medium size lymphocytes with round to irregular nuclei, scanty to moderately abundant cytoplasm, dense chromatin and inconspicuous nucleoli. This is associated with scattering of "naked" germinal centers with abundant tingible body macrophages. To further evaluate this process, flow cytometric analysis was performed and shows a minor population of monoclonal B cells expressing pan B-cell antigens including CD20 with associated kappa light chain restriction. In addition, immunohistochemical stains were performed including CD20, CD79a, CD3, CD5, CD10, BCL-2, CD21  with appropriate controls. The stains show a mixture of T and B cells with slight predominance of B cells. CD21 highlights numerous areas with dendritic networks, correlating with the presence of germinal centers. CD10 shows focal positivity likely in germinal centers. BCL-2 is diffusely positive except in areas of apparent germinal centers. The overall  features are atypical and suspicious for involvement by a B-cell lymphoproliferative process, particularly given the B-cell monoclonality by flow cytometry. However, morphologic evaluation is extremely limited due to small biopsy fragments and additional material (excisional biopsy) is strongly recommended for further evaluation. (BNS:ecj 02/21/2016)       03/04/2016 Procedure    Placement of single lumen port a cath via right internal jugular vein. The catheter tip lies at the cavo-atrial junction. A power injectable port a cath was placed and is ready for immediate use.      03/07/2016 -  Chemotherapy    She received treatment with bendamustine and Gazyva       INTERVAL HISTORY: Please see below for problem oriented charting. She is seen prior to cycle 2. She feels well. Her recent blood sugar and blood pressure is better controlled. She denies recent infection The patient denies any recent signs or symptoms of bleeding such as spontaneous epistaxis, hematuria or hematochezia.   REVIEW OF SYSTEMS:   Constitutional: Denies fevers, chills or abnormal weight loss Eyes: Denies blurriness of vision Ears, nose, mouth, throat, and face: Denies mucositis or sore throat Respiratory: Denies cough, dyspnea or wheezes Cardiovascular: Denies palpitation, chest discomfort or lower extremity swelling Gastrointestinal:  Denies nausea, heartburn or change in bowel habits Skin: Denies abnormal skin rashes Lymphatics: Denies new lymphadenopathy or easy bruising Neurological:Denies numbness, tingling or new weaknesses Behavioral/Psych: Mood is stable, no new changes  All other systems were reviewed with the patient and are negative.  I have reviewed the past medical history, past surgical history, social history and family history with the patient and they are unchanged from previous note.  ALLERGIES:  is allergic to aspirin; capoten [captopril]; levemir [insulin detemir]; micronase [glyburide];  onglyza [saxagliptin]; statins; and zocor [simvastatin].  MEDICATIONS:  Current Outpatient Prescriptions  Medication Sig Dispense Refill  . acyclovir (ZOVIRAX) 400 MG tablet Take 1 tablet (400 mg total) by mouth daily. 30 tablet 5  . allopurinol (ZYLOPRIM) 300 MG tablet Take 1 tablet (300 mg total) by mouth daily. 30 tablet 0  . amLODipine (NORVASC) 10 MG tablet Take 1 tablet (10 mg total) by mouth at bedtime. 30 tablet 3  . aspirin 81 MG tablet Take 40.5 mg by mouth 3 (three) times a week.    . Calcium Carbonate-Vit D-Min (CALCIUM 1200 PO) Take 1 tablet by mouth daily.    . cholecalciferol (VITAMIN D) 1000 UNITS tablet Take 1,000 Units by mouth daily.     . Cinnamon 500 MG capsule Take 500 mg by mouth daily.    . enalapril (VASOTEC) 20 MG tablet Take 20 mg by mouth every morning.    . fish oil-omega-3 fatty acids 1000 MG capsule Take 1 g by mouth daily.     Marland Kitchen glimepiride (AMARYL) 4 MG tablet Take 4 mg by mouth daily with breakfast.    . glucose blood test strip Use as instructed 100 each 12  . lidocaine-prilocaine (EMLA) cream Apply 1 application topically as needed. 30 g 3  . loperamide (IMODIUM) 2 MG capsule Take by mouth as needed for diarrhea or loose stools.    Marland Kitchen LORazepam (ATIVAN) 0.5 MG tablet Take 1 tablet (0.5  mg total) by mouth every 8 (eight) hours as needed for anxiety. 30 tablet 0  . Magnesium 500 MG CAPS Take 500 mg by mouth daily.     . metFORMIN (GLUCOPHAGE) 1000 MG tablet Take 1,000 mg by mouth 2 (two) times daily with a meal.     . Misc Natural Products (LUTEIN 20 PO) Take by mouth daily.    . Multiple Vitamins-Minerals (CENTRUM CARDIO) TABS Take 1 tablet by mouth daily.    . Multiple Vitamins-Minerals (ICAPS) CAPS Take by mouth daily.    . ondansetron (ZOFRAN) 8 MG tablet Take 1 tablet (8 mg total) by mouth every 8 (eight) hours as needed for nausea. 30 tablet 1  . prochlorperazine (COMPAZINE) 10 MG tablet Take 1 tablet (10 mg total) by mouth every 6 (six) hours as needed  (Nausea or vomiting). 30 tablet 1  . Red Yeast Rice 600 MG TABS Take 600 mg by mouth daily.      No current facility-administered medications for this visit.     PHYSICAL EXAMINATION: ECOG PERFORMANCE STATUS: 0 - Asymptomatic GENERAL:alert, no distress and comfortable SKIN: skin color, texture, turgor are normal, no rashes or significant lesions EYES: normal, Conjunctiva are pink and non-injected, sclera clear OROPHARYNX:no exudate, no erythema and lips, buccal mucosa, and tongue normal  NECK: supple, thyroid normal size, non-tender, without nodularity LYMPH:  no palpable lymphadenopathy in the cervical, axillary or inguinal LUNGS: clear to auscultation and percussion with normal breathing effort HEART: regular rate & rhythm and no murmurs and no lower extremity edema ABDOMEN:abdomen soft, non-tender and normal bowel sounds Musculoskeletal:no cyanosis of digits and no clubbing  NEURO: alert & oriented x 3 with fluent speech, no focal motor/sensory deficits  LABORATORY DATA:  I have reviewed the data as listed    Component Value Date/Time   NA 137 04/04/2016 0843   K 4.3 04/04/2016 0843   CL 106 02/19/2016 1120   CO2 24 04/04/2016 0843   GLUCOSE 309 (H) 04/04/2016 0843   BUN 20.4 04/04/2016 0843   CREATININE 1.1 04/04/2016 0843   CALCIUM 9.3 04/04/2016 0843   PROT 6.2 (L) 04/04/2016 0843   ALBUMIN 3.4 (L) 04/04/2016 0843   AST 19 04/04/2016 0843   ALT 19 04/04/2016 0843   ALKPHOS 80 04/04/2016 0843   BILITOT 0.43 04/04/2016 0843   GFRNONAA >60 02/19/2016 1120   GFRNONAA 47 (L) 05/18/2013 1256   GFRAA >60 02/19/2016 1120   GFRAA 55 (L) 05/18/2013 1256    No results found for: SPEP, UPEP  Lab Results  Component Value Date   WBC 4.0 04/04/2016   NEUTROABS 3.2 04/04/2016   HGB 9.9 (L) 04/04/2016   HCT 29.0 (L) 04/04/2016   MCV 83.3 04/04/2016   PLT 106 (L) 04/04/2016      Chemistry      Component Value Date/Time   NA 137 04/04/2016 0843   K 4.3 04/04/2016 0843    CL 106 02/19/2016 1120   CO2 24 04/04/2016 0843   BUN 20.4 04/04/2016 0843   CREATININE 1.1 04/04/2016 0843   GLU 202 (H) 08/09/2014 1000      Component Value Date/Time   CALCIUM 9.3 04/04/2016 0843   ALKPHOS 80 04/04/2016 0843   AST 19 04/04/2016 0843   ALT 19 04/04/2016 0843   BILITOT 0.43 04/04/2016 0843       RADIOGRAPHIC STUDIES: I have personally reviewed the radiological images as listed and agreed with the findings in the report. Mm Screening Breast Tomo Bilateral  Result  Date: 04/02/2016 CLINICAL DATA:  Screening. EXAM: 2D DIGITAL SCREENING BILATERAL MAMMOGRAM WITH CAD AND ADJUNCT TOMO COMPARISON:  Previous exam(s). ACR Breast Density Category b: There are scattered areas of fibroglandular density. FINDINGS: There are no findings suspicious for malignancy. Images were processed with CAD. IMPRESSION: No mammographic evidence of malignancy. A result letter of this screening mammogram will be mailed directly to the patient. RECOMMENDATION: Screening mammogram in one year. (Code:SM-B-01Y) BI-RADS CATEGORY  1: Negative. Electronically Signed   By: Lajean Manes M.D.   On: 04/02/2016 14:35    ASSESSMENT & PLAN:  Marginal zone lymphoma She tolerated treatment very well except for hypertension and hyperglycemia. We will continue treatment without dose adjustment. I plan to repeat imaging after 3 cycles of treatment  Pancytopenia, acquired ([place]) The mild anemia and thrombocytopenia related to treatment. She is not symptomatic. We will continue treatment without dose adjustment. She does not require blood transfusion.  Type 2 diabetes mellitus with diabetic nephropathy She has poorly controlled diabetes.  Plan to add regular insulin during treatment.  Essential hypertension Her blood pressure is better controlled Continue same treatment for now   No orders of the defined types were placed in this encounter.  All questions were answered. The patient knows to call the  clinic with any problems, questions or concerns. No barriers to learning was detected. I spent 15 minutes counseling the patient face to face. The total time spent in the appointment was 20 minutes and more than 50% was on counseling and review of test results     Heath Lark, MD 04/05/2016 7:50 AM

## 2016-04-05 NOTE — Telephone Encounter (Signed)
Opened in error

## 2016-04-05 NOTE — Assessment & Plan Note (Signed)
She has poorly controlled diabetes.  Plan to add regular insulin during treatment.

## 2016-04-05 NOTE — Assessment & Plan Note (Signed)
The mild anemia and thrombocytopenia related to treatment. She is not symptomatic. We will continue treatment without dose adjustment. She does not require blood transfusion.

## 2016-04-05 NOTE — Assessment & Plan Note (Signed)
She tolerated treatment very well except for hypertension and hyperglycemia. We will continue treatment without dose adjustment. I plan to repeat imaging after 3 cycles of treatment

## 2016-04-05 NOTE — Assessment & Plan Note (Signed)
Her blood pressure is better controlled Continue same treatment for now

## 2016-04-05 NOTE — Patient Instructions (Signed)
[facility] Discharge Instructions for Patients Receiving Chemotherapy  Today you received the following chemotherapy agents: Bendamustine   To help prevent nausea and vomiting after your treatment, we encourage you to take your nausea medication as prescribed.    If you develop nausea and vomiting that is not controlled by your nausea medication, call the clinic.   BELOW ARE SYMPTOMS THAT SHOULD BE REPORTED IMMEDIATELY:  *FEVER GREATER THAN 100.5 F  *CHILLS WITH OR WITHOUT FEVER  NAUSEA AND VOMITING THAT IS NOT CONTROLLED WITH YOUR NAUSEA MEDICATION  *UNUSUAL SHORTNESS OF BREATH  *UNUSUAL BRUISING OR BLEEDING  TENDERNESS IN MOUTH AND THROAT WITH OR WITHOUT PRESENCE OF ULCERS  *URINARY PROBLEMS  *BOWEL PROBLEMS  UNUSUAL RASH Items with * indicate a potential emergency and should be followed up as soon as possible.  Feel free to call the clinic you have any questions or concerns. The clinic phone number is [phone].  Please show the [place] at check-in to the Emergency Department and triage nurse.

## 2016-05-02 ENCOUNTER — Ambulatory Visit: Payer: Medicare Other

## 2016-05-02 ENCOUNTER — Ambulatory Visit (HOSPITAL_BASED_OUTPATIENT_CLINIC_OR_DEPARTMENT_OTHER): Payer: Medicare Other | Admitting: Hematology and Oncology

## 2016-05-02 ENCOUNTER — Other Ambulatory Visit (HOSPITAL_BASED_OUTPATIENT_CLINIC_OR_DEPARTMENT_OTHER): Payer: Medicare Other

## 2016-05-02 ENCOUNTER — Telehealth: Payer: Self-pay | Admitting: Hematology and Oncology

## 2016-05-02 ENCOUNTER — Encounter: Payer: Self-pay | Admitting: Hematology and Oncology

## 2016-05-02 ENCOUNTER — Ambulatory Visit (HOSPITAL_BASED_OUTPATIENT_CLINIC_OR_DEPARTMENT_OTHER): Payer: Medicare Other

## 2016-05-02 ENCOUNTER — Other Ambulatory Visit: Payer: Self-pay | Admitting: Hematology and Oncology

## 2016-05-02 VITALS — BP 140/65 | HR 96 | Temp 98.7°F | Resp 18

## 2016-05-02 VITALS — BP 155/56 | HR 96 | Temp 98.3°F | Resp 17 | Ht 64.0 in | Wt 151.4 lb

## 2016-05-02 DIAGNOSIS — C8588 Other specified types of non-Hodgkin lymphoma, lymph nodes of multiple sites: Secondary | ICD-10-CM

## 2016-05-02 DIAGNOSIS — C8580 Other specified types of non-Hodgkin lymphoma, unspecified site: Secondary | ICD-10-CM

## 2016-05-02 DIAGNOSIS — Z5111 Encounter for antineoplastic chemotherapy: Secondary | ICD-10-CM | POA: Diagnosis not present

## 2016-05-02 DIAGNOSIS — I10 Essential (primary) hypertension: Secondary | ICD-10-CM

## 2016-05-02 DIAGNOSIS — D61818 Other pancytopenia: Secondary | ICD-10-CM

## 2016-05-02 DIAGNOSIS — Z95828 Presence of other vascular implants and grafts: Secondary | ICD-10-CM | POA: Insufficient documentation

## 2016-05-02 LAB — CBC WITH DIFFERENTIAL/PLATELET
BASO%: 0.7 % (ref 0.0–2.0)
Basophils Absolute: 0 10*3/uL (ref 0.0–0.1)
EOS%: 4.2 % (ref 0.0–7.0)
Eosinophils Absolute: 0.2 10*3/uL (ref 0.0–0.5)
HEMATOCRIT: 28.7 % — AB (ref 34.8–46.6)
HGB: 9.7 g/dL — ABNORMAL LOW (ref 11.6–15.9)
LYMPH#: 1 10*3/uL (ref 0.9–3.3)
LYMPH%: 17.9 % (ref 14.0–49.7)
MCH: 28.5 pg (ref 25.1–34.0)
MCHC: 33.8 g/dL (ref 31.5–36.0)
MCV: 84.4 fL (ref 79.5–101.0)
MONO#: 0.6 10*3/uL (ref 0.1–0.9)
MONO%: 11.2 % (ref 0.0–14.0)
NEUT#: 3.6 10*3/uL (ref 1.5–6.5)
NEUT%: 66 % (ref 38.4–76.8)
Platelets: 141 10*3/uL — ABNORMAL LOW (ref 145–400)
RBC: 3.4 10*6/uL — ABNORMAL LOW (ref 3.70–5.45)
RDW: 16 % — AB (ref 11.2–14.5)
WBC: 5.5 10*3/uL (ref 3.9–10.3)

## 2016-05-02 LAB — COMPREHENSIVE METABOLIC PANEL
ALK PHOS: 93 U/L (ref 40–150)
ALT: 29 U/L (ref 0–55)
AST: 24 U/L (ref 5–34)
Albumin: 3.4 g/dL — ABNORMAL LOW (ref 3.5–5.0)
Anion Gap: 9 mEq/L (ref 3–11)
BILIRUBIN TOTAL: 0.51 mg/dL (ref 0.20–1.20)
BUN: 21 mg/dL (ref 7.0–26.0)
CALCIUM: 9.7 mg/dL (ref 8.4–10.4)
CHLORIDE: 103 meq/L (ref 98–109)
CO2: 25 mEq/L (ref 22–29)
CREATININE: 1.1 mg/dL (ref 0.6–1.1)
EGFR: 47 mL/min/{1.73_m2} — ABNORMAL LOW (ref >90)
GLUCOSE: 291 mg/dL — AB (ref 70–140)
Potassium: 4.3 mEq/L (ref 3.5–5.1)
Sodium: 136 mEq/L (ref 136–145)
Total Protein: 6.1 g/dL — ABNORMAL LOW (ref 6.4–8.3)

## 2016-05-02 MED ORDER — PALONOSETRON HCL INJECTION 0.25 MG/5ML
0.2500 mg | Freq: Once | INTRAVENOUS | Status: AC
  Administered 2016-05-02: 0.25 mg via INTRAVENOUS

## 2016-05-02 MED ORDER — HEPARIN SOD (PORK) LOCK FLUSH 100 UNIT/ML IV SOLN
500.0000 [IU] | Freq: Once | INTRAVENOUS | Status: DC | PRN
  Filled 2016-05-02: qty 5

## 2016-05-02 MED ORDER — BENDAMUSTINE HCL (LYOPHILIZED PWD) CHEMO INJECTION 100MG
82.0000 mg/m2 | Freq: Once | INTRAVENOUS | Status: AC
  Administered 2016-05-02: 150 mg via INTRAVENOUS
  Filled 2016-05-02: qty 20

## 2016-05-02 MED ORDER — ACETAMINOPHEN 325 MG PO TABS
650.0000 mg | ORAL_TABLET | Freq: Once | ORAL | Status: AC
  Administered 2016-05-02: 650 mg via ORAL

## 2016-05-02 MED ORDER — ACETAMINOPHEN 325 MG PO TABS
ORAL_TABLET | ORAL | Status: AC
  Filled 2016-05-02: qty 2

## 2016-05-02 MED ORDER — DEXAMETHASONE SODIUM PHOSPHATE 10 MG/ML IJ SOLN
10.0000 mg | Freq: Once | INTRAMUSCULAR | Status: AC
  Administered 2016-05-02: 10 mg via INTRAVENOUS

## 2016-05-02 MED ORDER — SODIUM CHLORIDE 0.9 % IV SOLN
Freq: Once | INTRAVENOUS | Status: AC
  Administered 2016-05-02: 10:00:00 via INTRAVENOUS

## 2016-05-02 MED ORDER — PALONOSETRON HCL INJECTION 0.25 MG/5ML
INTRAVENOUS | Status: AC
  Filled 2016-05-02: qty 5

## 2016-05-02 MED ORDER — DIPHENHYDRAMINE HCL 50 MG/ML IJ SOLN
50.0000 mg | Freq: Once | INTRAMUSCULAR | Status: AC
  Administered 2016-05-02: 50 mg via INTRAVENOUS

## 2016-05-02 MED ORDER — DEXAMETHASONE SODIUM PHOSPHATE 10 MG/ML IJ SOLN
INTRAMUSCULAR | Status: AC
  Filled 2016-05-02: qty 1

## 2016-05-02 MED ORDER — OBINUTUZUMAB CHEMO INJECTION 1000 MG/40ML
1000.0000 mg | Freq: Once | INTRAVENOUS | Status: AC
  Administered 2016-05-02: 1000 mg via INTRAVENOUS
  Filled 2016-05-02: qty 40

## 2016-05-02 MED ORDER — DIPHENHYDRAMINE HCL 50 MG/ML IJ SOLN
INTRAMUSCULAR | Status: AC
  Filled 2016-05-02: qty 1

## 2016-05-02 MED ORDER — SODIUM CHLORIDE 0.9% FLUSH
10.0000 mL | INTRAVENOUS | Status: DC | PRN
  Filled 2016-05-02: qty 10

## 2016-05-02 MED ORDER — SODIUM CHLORIDE 0.9% FLUSH
10.0000 mL | INTRAVENOUS | Status: DC | PRN
  Administered 2016-05-02: 10 mL via INTRAVENOUS
  Filled 2016-05-02: qty 10

## 2016-05-02 NOTE — Assessment & Plan Note (Addendum)
The mild anemia and thrombocytopenia related to treatment. She is not symptomatic. We will continue treatment with mild dose adjustment.

## 2016-05-02 NOTE — Progress Notes (Signed)
[facility] OFFICE PROGRESS NOTE  Patient Care Team: Aretta Nip, MD as PCP - General (Family Medicine) Sharyne Peach, MD as Consulting Physician (Ophthalmology) Inda Castle, MD as Consulting Physician (Gastroenterology) Theodis Sato, MD (Inactive) as Consulting Physician (Orthopedic Surgery) Jannette Spanner, MD as Referring Physician (Dermatology) Heath Lark, MD as Consulting Physician (Hematology and Oncology) Alphonsa Overall, MD as Consulting Physician (General Surgery)  SUMMARY OF ONCOLOGIC HISTORY: Oncology History   Marginal zone lymphoma, with lymph node and bone marrow involvement along with splenomegaly   Primary site: Lymphoid Neoplasms   Staging method: AJCC 6th Edition   Clinical: Stage IV signed by Heath Lark, MD on 09/02/2013  7:38 PM   Summary: Stage IV       Marginal zone lymphoma ([place])   07/29/2013 Imaging    CT scan shows splenomegaly and abnormal lymphadenopathy.      08/13/2013 Imaging    PET CT scan confirmed lymphadenopathy and splenomegaly, those areas are PET avid      08/23/2013 Surgery    [id number] left axillary lymph node biopsy confirmed a low-grade lymphoma.      08/27/2013 Bone Marrow Biopsy    Bone marrow biopsy confirmed lymphoma.[id number]      09/08/2013 - 09/29/2013 Chemotherapy    She completed 4 cycles of rituximab with resolution of splenomegaly.      12/29/2013 Imaging    PET scan show complete response.      07/11/2014 Imaging    PET scan showed recurrent disease      07/19/2014 - 08/09/2014 Chemotherapy    She completed 4 cycles of rituximab with resolution of splenomegaly      10/12/2014 Imaging    Repeat PET/CT scan showed near complete response to treatment.      10/13/2014 - 02/09/2015 Chemotherapy    She is placed on rituximab maintenance therapy.      03/29/2015 Imaging    PET CT showed possible mild progression of splenomegaly      04/26/2015 - 02/22/2016 Chemotherapy    She started taking  Ibrutinib      04/28/2015 Adverse Reaction    Treatment was placed temporarily on hold due to severe diarrhea and resumed at reduced dose      07/03/2015 PET scan    No hypermetabolic adenopathy in the neck, chest, abdomen or pelvis.2. Spleen is not hypermetabolic and appears slightly less prominent size.      02/06/2016 PET scan    Hypermetabolic LEFT axillary lymph nodes. Concern for HIGH-GRADE LYMPHOMA LOCALIZED RECURRENCE. 2. Prominent spleen without hypermetabolic activity. No change in size from comparison. 3. No additional hypermetabolic adenopathy on scan.      02/19/2016 Procedure    Technically successful ultrasound-guided core biopsy, left axillary adenopathy.      02/19/2016 Pathology Results    Lymph node, needle/core biopsy, left axilla - ATYPICAL LYMPHOID PROLIFERATION SUSPICIOUS FOR LYMPHOMA. - SEE COMMENT. Microscopic Comment The sections show multiple very small and skinny fragments of lymph nodal tissue displaying areas of lymphoid expansion by a population of small to medium size lymphocytes with round to irregular nuclei, scanty to moderately abundant cytoplasm, dense chromatin and inconspicuous nucleoli. This is associated with scattering of "naked" germinal centers with abundant tingible body macrophages. To further evaluate this process, flow cytometric analysis was performed and shows a minor population of monoclonal B cells expressing pan B-cell antigens including CD20 with associated kappa light chain restriction. In addition, immunohistochemical stains were performed including CD20, CD79a, CD3, CD5, CD10, BCL-2, CD21  with appropriate controls. The stains show a mixture of T and B cells with slight predominance of B cells. CD21 highlights numerous areas with dendritic networks, correlating with the presence of germinal centers. CD10 shows focal positivity likely in germinal centers. BCL-2 is diffusely positive except in areas of apparent germinal centers. The overall  features are atypical and suspicious for involvement by a B-cell lymphoproliferative process, particularly given the B-cell monoclonality by flow cytometry. However, morphologic evaluation is extremely limited due to small biopsy fragments and additional material (excisional biopsy) is strongly recommended for further evaluation. (BNS:ecj 02/21/2016)       03/04/2016 Procedure    Placement of single lumen port a cath via right internal jugular vein. The catheter tip lies at the cavo-atrial junction. A power injectable port a cath was placed and is ready for immediate use.      03/07/2016 -  Chemotherapy    She received treatment with bendamustine and Gazyva       INTERVAL HISTORY: Please see below for problem oriented charting. She is seen prior to cycle 3 of chemotherapy She tolerated chemotherapy well Denies recent infection. No new lymphadenopathy.The patient denies any recent signs or symptoms of bleeding such as spontaneous epistaxis, hematuria or hematochezia.   REVIEW OF SYSTEMS:   Constitutional: Denies fevers, chills or abnormal weight loss Eyes: Denies blurriness of vision Ears, nose, mouth, throat, and face: Denies mucositis or sore throat Respiratory: Denies cough, dyspnea or wheezes Cardiovascular: Denies palpitation, chest discomfort or lower extremity swelling Gastrointestinal:  Denies nausea, heartburn or change in bowel habits Skin: Denies abnormal skin rashes Lymphatics: Denies new lymphadenopathy or easy bruising Neurological:Denies numbness, tingling or new weaknesses Behavioral/Psych: Mood is stable, no new changes  All other systems were reviewed with the patient and are negative.  I have reviewed the past medical history, past surgical history, social history and family history with the patient and they are unchanged from previous note.  ALLERGIES:  is allergic to aspirin; capoten [captopril]; levemir [insulin detemir]; micronase [glyburide]; onglyza  [saxagliptin]; statins; and zocor [simvastatin].  MEDICATIONS:  Current Outpatient Prescriptions  Medication Sig Dispense Refill  . acyclovir (ZOVIRAX) 400 MG tablet Take 1 tablet (400 mg total) by mouth daily. 30 tablet 5  . allopurinol (ZYLOPRIM) 300 MG tablet Take 1 tablet (300 mg total) by mouth daily. 30 tablet 0  . amLODipine (NORVASC) 10 MG tablet Take 1 tablet (10 mg total) by mouth at bedtime. 30 tablet 3  . aspirin 81 MG tablet Take 40.5 mg by mouth 3 (three) times a week.    . Calcium Carbonate-Vit D-Min (CALCIUM 1200 PO) Take 1 tablet by mouth daily.    . cholecalciferol (VITAMIN D) 1000 UNITS tablet Take 1,000 Units by mouth daily.     . enalapril (VASOTEC) 20 MG tablet Take 20 mg by mouth every morning.    . fish oil-omega-3 fatty acids 1000 MG capsule Take 1 g by mouth daily.     Marland Kitchen glimepiride (AMARYL) 4 MG tablet Take 4 mg by mouth daily with breakfast.    . glucose blood test strip Use as instructed 100 each 12  . lidocaine-prilocaine (EMLA) cream Apply 1 application topically as needed. 30 g 3  . loperamide (IMODIUM) 2 MG capsule Take by mouth as needed for diarrhea or loose stools.    Marland Kitchen LORazepam (ATIVAN) 0.5 MG tablet Take 1 tablet (0.5 mg total) by mouth every 8 (eight) hours as needed for anxiety. 30 tablet 0  . Magnesium 500 MG  CAPS Take 500 mg by mouth daily.     . metFORMIN (GLUCOPHAGE) 1000 MG tablet Take 1,000 mg by mouth 2 (two) times daily with a meal.     . Misc Natural Products (LUTEIN 20 PO) Take by mouth daily.    . Multiple Vitamins-Minerals (CENTRUM CARDIO) TABS Take 1 tablet by mouth daily.    . Multiple Vitamins-Minerals (ICAPS) CAPS Take by mouth daily.    . ondansetron (ZOFRAN) 8 MG tablet Take 1 tablet (8 mg total) by mouth every 8 (eight) hours as needed for nausea. 30 tablet 1  . prochlorperazine (COMPAZINE) 10 MG tablet Take 1 tablet (10 mg total) by mouth every 6 (six) hours as needed (Nausea or vomiting). 30 tablet 1   No current  facility-administered medications for this visit.     PHYSICAL EXAMINATION: ECOG PERFORMANCE STATUS: 0 - Asymptomatic  Vitals:   05/02/16 0840  BP: (!) 155/56  Pulse: 96  Resp: 17  Temp: 98.3 F (36.8 C)   Filed Weights   05/02/16 0840  Weight: 151 lb 6.4 oz (68.7 kg)    GENERAL:alert, no distress and comfortable SKIN: skin color, texture, turgor are normal, no rashes or significant lesions EYES: normal, Conjunctiva are pink and non-injected, sclera clear OROPHARYNX:no exudate, no erythema and lips, buccal mucosa, and tongue normal  NECK: supple, thyroid normal size, non-tender, without nodularity LYMPH:  no palpable lymphadenopathy in the cervical, axillary or inguinal LUNGS: clear to auscultation and percussion with normal breathing effort HEART: regular rate & rhythm and no murmurs and no lower extremity edema ABDOMEN:abdomen soft, non-tender and normal bowel sounds Musculoskeletal:no cyanosis of digits and no clubbing  NEURO: alert & oriented x 3 with fluent speech, no focal motor/sensory deficits  LABORATORY DATA:  I have reviewed the data as listed    Component Value Date/Time   NA 137 04/04/2016 0843   K 4.3 04/04/2016 0843   CL 106 02/19/2016 1120   CO2 24 04/04/2016 0843   GLUCOSE 309 (H) 04/04/2016 0843   BUN 20.4 04/04/2016 0843   CREATININE 1.1 04/04/2016 0843   CALCIUM 9.3 04/04/2016 0843   PROT 6.2 (L) 04/04/2016 0843   ALBUMIN 3.4 (L) 04/04/2016 0843   AST 19 04/04/2016 0843   ALT 19 04/04/2016 0843   ALKPHOS 80 04/04/2016 0843   BILITOT 0.43 04/04/2016 0843   GFRNONAA >60 02/19/2016 1120   GFRNONAA 47 (L) 05/18/2013 1256   GFRAA >60 02/19/2016 1120   GFRAA 55 (L) 05/18/2013 1256    No results found for: SPEP, UPEP  Lab Results  Component Value Date   WBC 5.5 05/02/2016   NEUTROABS 3.6 05/02/2016   HGB 9.7 (L) 05/02/2016   HCT 28.7 (L) 05/02/2016   MCV 84.4 05/02/2016   PLT 141 (L) 05/02/2016      Chemistry      Component Value  Date/Time   NA 137 04/04/2016 0843   K 4.3 04/04/2016 0843   CL 106 02/19/2016 1120   CO2 24 04/04/2016 0843   BUN 20.4 04/04/2016 0843   CREATININE 1.1 04/04/2016 0843   GLU 202 (H) 08/09/2014 1000      Component Value Date/Time   CALCIUM 9.3 04/04/2016 0843   ALKPHOS 80 04/04/2016 0843   AST 19 04/04/2016 0843   ALT 19 04/04/2016 0843   BILITOT 0.43 04/04/2016 0843       RADIOGRAPHIC STUDIES: I have personally reviewed the radiological images as listed and agreed with the findings in the report. Mm Screening Breast  Tomo Bilateral  Result Date: 04/02/2016 CLINICAL DATA:  Screening. EXAM: 2D DIGITAL SCREENING BILATERAL MAMMOGRAM WITH CAD AND ADJUNCT TOMO COMPARISON:  Previous exam(s). ACR Breast Density Category b: There are scattered areas of fibroglandular density. FINDINGS: There are no findings suspicious for malignancy. Images were processed with CAD. IMPRESSION: No mammographic evidence of malignancy. A result letter of this screening mammogram will be mailed directly to the patient. RECOMMENDATION: Screening mammogram in one year. (Code:SM-B-01Y) BI-RADS CATEGORY  1: Negative. Electronically Signed   By: Lajean Manes M.D.   On: 04/02/2016 14:35    ASSESSMENT & PLAN:  Marginal zone lymphoma She tolerated treatment very well except for hypertension, mild pancytopenia and hyperglycemia. We will continue treatment with mild dose adjustment. I plan to repeat imaging after 3 cycles of treatment  Pancytopenia, acquired ([place]) The mild anemia and thrombocytopenia related to treatment. She is not symptomatic. We will continue treatment with mild dose adjustment.  Essential hypertension Her blood pressure is better controlled Continue same treatment for now   Orders Placed This Encounter  Procedures  . NM PET Image Restag (PS) Skull Base To Thigh    Standing Status:   Future    Standing Expiration Date:   07/02/2017    Order Specific Question:   Reason for Exam (SYMPTOM  OR  DIAGNOSIS REQUIRED)    Answer:   staging lymphoma, assess response to Rx    Order Specific Question:   Preferred imaging location?    Answer:   [facility]   All questions were answered. The patient knows to call the clinic with any problems, questions or concerns. No barriers to learning was detected. I spent 15 minutes counseling the patient face to face. The total time spent in the appointment was 20 minutes and more than 50% was on counseling and review of test results     Heath Lark, MD 05/02/2016 9:14 AM

## 2016-05-02 NOTE — Assessment & Plan Note (Addendum)
She tolerated treatment very well except for hypertension, mild pancytopenia and hyperglycemia. We will continue treatment with mild dose adjustment. I plan to repeat imaging after 3 cycles of treatment

## 2016-05-02 NOTE — Patient Instructions (Signed)
[facility] Discharge Instructions for Patients Receiving Chemotherapy  Today you received the following chemotherapy agents:  Gazyva and bendamustine.  To help prevent nausea and vomiting after your treatment, we encourage you to take your nausea medication as directed.   If you develop nausea and vomiting that is not controlled by your nausea medication, call the clinic.   BELOW ARE SYMPTOMS THAT SHOULD BE REPORTED IMMEDIATELY:  *FEVER GREATER THAN 100.5 F  *CHILLS WITH OR WITHOUT FEVER  NAUSEA AND VOMITING THAT IS NOT CONTROLLED WITH YOUR NAUSEA MEDICATION  *UNUSUAL SHORTNESS OF BREATH  *UNUSUAL BRUISING OR BLEEDING  TENDERNESS IN MOUTH AND THROAT WITH OR WITHOUT PRESENCE OF ULCERS  *URINARY PROBLEMS  *BOWEL PROBLEMS  UNUSUAL RASH Items with * indicate a potential emergency and should be followed up as soon as possible.  Feel free to call the clinic you have any questions or concerns. The clinic phone number is [phone].  Please show the [facility] at check-in to the Emergency Department and triage nurse.

## 2016-05-02 NOTE — Assessment & Plan Note (Signed)
Her blood pressure is better controlled Continue same treatment for now

## 2016-05-02 NOTE — Telephone Encounter (Signed)
Scheduled appts per 05/02/2016 los. - patient to receive new schedule in the treatment area

## 2016-05-03 ENCOUNTER — Ambulatory Visit (HOSPITAL_BASED_OUTPATIENT_CLINIC_OR_DEPARTMENT_OTHER): Payer: Medicare Other

## 2016-05-03 VITALS — BP 149/59 | HR 90 | Temp 98.3°F | Resp 16

## 2016-05-03 DIAGNOSIS — C8588 Other specified types of non-Hodgkin lymphoma, lymph nodes of multiple sites: Secondary | ICD-10-CM | POA: Diagnosis not present

## 2016-05-03 DIAGNOSIS — Z5111 Encounter for antineoplastic chemotherapy: Secondary | ICD-10-CM

## 2016-05-03 DIAGNOSIS — C8580 Other specified types of non-Hodgkin lymphoma, unspecified site: Secondary | ICD-10-CM

## 2016-05-03 MED ORDER — HEPARIN SOD (PORK) LOCK FLUSH 100 UNIT/ML IV SOLN
500.0000 [IU] | Freq: Once | INTRAVENOUS | Status: AC | PRN
  Administered 2016-05-03: 500 [IU]
  Filled 2016-05-03: qty 5

## 2016-05-03 MED ORDER — SODIUM CHLORIDE 0.9 % IV SOLN
Freq: Once | INTRAVENOUS | Status: AC
  Administered 2016-05-03: 10:00:00 via INTRAVENOUS

## 2016-05-03 MED ORDER — DEXAMETHASONE SODIUM PHOSPHATE 10 MG/ML IJ SOLN
10.0000 mg | Freq: Once | INTRAMUSCULAR | Status: AC
  Administered 2016-05-03: 10 mg via INTRAVENOUS

## 2016-05-03 MED ORDER — DEXAMETHASONE SODIUM PHOSPHATE 10 MG/ML IJ SOLN
INTRAMUSCULAR | Status: AC
  Filled 2016-05-03: qty 1

## 2016-05-03 MED ORDER — SODIUM CHLORIDE 0.9% FLUSH
10.0000 mL | INTRAVENOUS | Status: DC | PRN
  Administered 2016-05-03: 10 mL
  Filled 2016-05-03: qty 10

## 2016-05-03 MED ORDER — SODIUM CHLORIDE 0.9 % IV SOLN
83.0000 mg/m2 | Freq: Once | INTRAVENOUS | Status: AC
  Administered 2016-05-03: 150 mg via INTRAVENOUS
  Filled 2016-05-03: qty 20

## 2016-05-03 NOTE — Patient Instructions (Signed)
[facility] Discharge Instructions for Patients Receiving Chemotherapy  Today you received the following chemotherapy agents bendamustine (Bendeka).  To help prevent nausea and vomiting after your treatment, we encourage you to take your nausea medication as directed.    If you develop nausea and vomiting that is not controlled by your nausea medication, call the clinic.   BELOW ARE SYMPTOMS THAT SHOULD BE REPORTED IMMEDIATELY:  *FEVER GREATER THAN 100.5 F  *CHILLS WITH OR WITHOUT FEVER  NAUSEA AND VOMITING THAT IS NOT CONTROLLED WITH YOUR NAUSEA MEDICATION  *UNUSUAL SHORTNESS OF BREATH  *UNUSUAL BRUISING OR BLEEDING  TENDERNESS IN MOUTH AND THROAT WITH OR WITHOUT PRESENCE OF ULCERS  *URINARY PROBLEMS  *BOWEL PROBLEMS  UNUSUAL RASH Items with * indicate a potential emergency and should be followed up as soon as possible.  Feel free to call the clinic you have any questions or concerns. The clinic phone number is [phone].  Please show the CHEMO ALERT CARD at check-in to the Emergency Department and triage nurse.  

## 2016-05-27 ENCOUNTER — Ambulatory Visit (HOSPITAL_COMMUNITY)
Admission: RE | Admit: 2016-05-27 | Discharge: 2016-05-27 | Disposition: A | Discharge status: Home / Self Care | Payer: Medicare Other | Source: Ambulatory Visit | Attending: Hematology and Oncology | Admitting: Hematology and Oncology

## 2016-05-27 DIAGNOSIS — C8580 Other specified types of non-Hodgkin lymphoma, unspecified site: Secondary | ICD-10-CM | POA: Insufficient documentation

## 2016-05-27 LAB — GLUCOSE, CAPILLARY: GLUCOSE-CAPILLARY: 178 mg/dL — AB (ref 65–99)

## 2016-05-27 MED ORDER — FLUDEOXYGLUCOSE F - 18 (FDG) INJECTION
7.4000 | Freq: Once | INTRAVENOUS | Status: AC
  Administered 2016-05-27: 7.4 via INTRAVENOUS

## 2016-05-30 ENCOUNTER — Other Ambulatory Visit: Payer: Medicare Other

## 2016-05-30 ENCOUNTER — Ambulatory Visit (HOSPITAL_BASED_OUTPATIENT_CLINIC_OR_DEPARTMENT_OTHER): Payer: Medicare Other | Admitting: Hematology and Oncology

## 2016-05-30 ENCOUNTER — Ambulatory Visit: Payer: Medicare Other

## 2016-05-30 ENCOUNTER — Ambulatory Visit (HOSPITAL_BASED_OUTPATIENT_CLINIC_OR_DEPARTMENT_OTHER): Payer: Medicare Other

## 2016-05-30 VITALS — BP 140/61 | HR 97 | Temp 98.0°F | Resp 16

## 2016-05-30 VITALS — BP 153/59 | HR 96 | Temp 97.8°F | Resp 18 | Ht 64.0 in | Wt 149.7 lb

## 2016-05-30 DIAGNOSIS — N183 Chronic kidney disease, stage 3 unspecified: Secondary | ICD-10-CM

## 2016-05-30 DIAGNOSIS — E1121 Type 2 diabetes mellitus with diabetic nephropathy: Secondary | ICD-10-CM

## 2016-05-30 DIAGNOSIS — C8580 Other specified types of non-Hodgkin lymphoma, unspecified site: Secondary | ICD-10-CM

## 2016-05-30 DIAGNOSIS — Z5112 Encounter for antineoplastic immunotherapy: Secondary | ICD-10-CM | POA: Diagnosis not present

## 2016-05-30 DIAGNOSIS — Z5111 Encounter for antineoplastic chemotherapy: Secondary | ICD-10-CM | POA: Diagnosis not present

## 2016-05-30 DIAGNOSIS — I10 Essential (primary) hypertension: Secondary | ICD-10-CM

## 2016-05-30 DIAGNOSIS — C8588 Other specified types of non-Hodgkin lymphoma, lymph nodes of multiple sites: Secondary | ICD-10-CM

## 2016-05-30 DIAGNOSIS — Z95828 Presence of other vascular implants and grafts: Secondary | ICD-10-CM

## 2016-05-30 DIAGNOSIS — D630 Anemia in neoplastic disease: Secondary | ICD-10-CM

## 2016-05-30 LAB — CBC WITH DIFFERENTIAL/PLATELET
BASO%: 0.8 % (ref 0.0–2.0)
BASOS ABS: 0 10*3/uL (ref 0.0–0.1)
EOS%: 4.7 % (ref 0.0–7.0)
Eosinophils Absolute: 0.2 10*3/uL (ref 0.0–0.5)
HCT: 28.2 % — ABNORMAL LOW (ref 34.8–46.6)
HGB: 9.7 g/dL — ABNORMAL LOW (ref 11.6–15.9)
LYMPH#: 0.6 10*3/uL — AB (ref 0.9–3.3)
LYMPH%: 11.1 % — AB (ref 14.0–49.7)
MCH: 28.9 pg (ref 25.1–34.0)
MCHC: 34.4 g/dL (ref 31.5–36.0)
MCV: 83.9 fL (ref 79.5–101.0)
MONO#: 0.6 10*3/uL (ref 0.1–0.9)
MONO%: 12.3 % (ref 0.0–14.0)
NEUT#: 3.7 10*3/uL (ref 1.5–6.5)
NEUT%: 71.1 % (ref 38.4–76.8)
Platelets: 159 10*3/uL (ref 145–400)
RBC: 3.36 10*6/uL — ABNORMAL LOW (ref 3.70–5.45)
RDW: 14.2 % (ref 11.2–14.5)
WBC: 5.1 10*3/uL (ref 3.9–10.3)

## 2016-05-30 LAB — COMPREHENSIVE METABOLIC PANEL
ALT: 20 U/L (ref 0–55)
AST: 20 U/L (ref 5–34)
Albumin: 3.7 g/dL (ref 3.5–5.0)
Alkaline Phosphatase: 95 U/L (ref 40–150)
Anion Gap: 10 mEq/L (ref 3–11)
BUN: 16.3 mg/dL (ref 7.0–26.0)
CO2: 25 mEq/L (ref 22–29)
Calcium: 9.4 mg/dL (ref 8.4–10.4)
Chloride: 102 mEq/L (ref 98–109)
Creatinine: 1 mg/dL (ref 0.6–1.1)
EGFR: 52 mL/min/{1.73_m2} — ABNORMAL LOW (ref >90)
GLUCOSE: 246 mg/dL — AB (ref 70–140)
POTASSIUM: 4.6 meq/L (ref 3.5–5.1)
SODIUM: 138 meq/L (ref 136–145)
Total Bilirubin: 0.52 mg/dL (ref 0.20–1.20)
Total Protein: 6.5 g/dL (ref 6.4–8.3)

## 2016-05-30 MED ORDER — SODIUM CHLORIDE 0.9% FLUSH
10.0000 mL | INTRAVENOUS | Status: DC | PRN
  Administered 2016-05-30: 10 mL
  Filled 2016-05-30: qty 10

## 2016-05-30 MED ORDER — DIPHENHYDRAMINE HCL 50 MG/ML IJ SOLN
INTRAMUSCULAR | Status: AC
  Filled 2016-05-30: qty 1

## 2016-05-30 MED ORDER — PALONOSETRON HCL INJECTION 0.25 MG/5ML
INTRAVENOUS | Status: AC
  Filled 2016-05-30: qty 5

## 2016-05-30 MED ORDER — PALONOSETRON HCL INJECTION 0.25 MG/5ML
0.2500 mg | Freq: Once | INTRAVENOUS | Status: AC
  Administered 2016-05-30: 0.25 mg via INTRAVENOUS

## 2016-05-30 MED ORDER — HEPARIN SOD (PORK) LOCK FLUSH 100 UNIT/ML IV SOLN
500.0000 [IU] | Freq: Once | INTRAVENOUS | Status: AC | PRN
  Administered 2016-05-30: 500 [IU]
  Filled 2016-05-30: qty 5

## 2016-05-30 MED ORDER — BENDAMUSTINE HCL (LYOPHILIZED PWD) CHEMO INJECTION 100MG
150.0000 mg | Freq: Once | INTRAVENOUS | Status: AC
  Administered 2016-05-30: 150 mg via INTRAVENOUS
  Filled 2016-05-30: qty 500

## 2016-05-30 MED ORDER — SODIUM CHLORIDE 0.9 % IV SOLN
1000.0000 mg | Freq: Once | INTRAVENOUS | Status: AC
  Administered 2016-05-30: 1000 mg via INTRAVENOUS
  Filled 2016-05-30: qty 40

## 2016-05-30 MED ORDER — SODIUM CHLORIDE 0.9% FLUSH
10.0000 mL | Freq: Once | INTRAVENOUS | Status: AC
  Administered 2016-05-30: 10 mL
  Filled 2016-05-30: qty 10

## 2016-05-30 MED ORDER — ACETAMINOPHEN 325 MG PO TABS
650.0000 mg | ORAL_TABLET | Freq: Once | ORAL | Status: AC
  Administered 2016-05-30: 650 mg via ORAL

## 2016-05-30 MED ORDER — AMLODIPINE BESYLATE 10 MG PO TABS: 10.0000 mg | ORAL_TABLET | Freq: Every day | ORAL | 3 refills | Status: DC

## 2016-05-30 MED ORDER — DIPHENHYDRAMINE HCL 50 MG/ML IJ SOLN
50.0000 mg | Freq: Once | INTRAMUSCULAR | Status: AC
  Administered 2016-05-30: 50 mg via INTRAVENOUS

## 2016-05-30 MED ORDER — DEXAMETHASONE SODIUM PHOSPHATE 10 MG/ML IJ SOLN
INTRAMUSCULAR | Status: AC
  Filled 2016-05-30: qty 1

## 2016-05-30 MED ORDER — ACETAMINOPHEN 325 MG PO TABS
ORAL_TABLET | ORAL | Status: AC
  Filled 2016-05-30: qty 2

## 2016-05-30 MED ORDER — SODIUM CHLORIDE 0.9 % IV SOLN
Freq: Once | INTRAVENOUS | Status: AC
  Administered 2016-05-30: 10:00:00 via INTRAVENOUS

## 2016-05-30 MED ORDER — DEXAMETHASONE SODIUM PHOSPHATE 10 MG/ML IJ SOLN
10.0000 mg | Freq: Once | INTRAMUSCULAR | Status: AC
  Administered 2016-05-30: 10 mg via INTRAVENOUS

## 2016-05-30 NOTE — Patient Instructions (Signed)
Implanted Port Home Guide An implanted port is a type of central line that is placed under the skin. Central lines are used to provide IV access when treatment or nutrition needs to be given through a person's veins. Implanted ports are used for long-term IV access. An implanted port may be placed because:  You need IV medicine that would be irritating to the small veins in your hands or arms.  You need long-term IV medicines, such as antibiotics.  You need IV nutrition for a long period.  You need frequent blood draws for lab tests.  You need dialysis.  Implanted ports are usually placed in the chest area, but they can also be placed in the upper arm, the abdomen, or the leg. An implanted port has two main parts:  Reservoir. The reservoir is round and will appear as a small, raised area under your skin. The reservoir is the part where a needle is inserted to give medicines or draw blood.  Catheter. The catheter is a thin, flexible tube that extends from the reservoir. The catheter is placed into a large vein. Medicine that is inserted into the reservoir goes into the catheter and then into the vein.  How will I care for my incision site? Do not get the incision site wet. Bathe or shower as directed by your health care provider. How is my port accessed? Special steps must be taken to access the port:  Before the port is accessed, a numbing cream can be placed on the skin. This helps numb the skin over the port site.  Your health care provider uses a sterile technique to access the port. ? Your health care provider must put on a mask and sterile gloves. ? The skin over your port is cleaned carefully with an antiseptic and allowed to dry. ? The port is gently pinched between sterile gloves, and a needle is inserted into the port.  Only "non-coring" port needles should be used to access the port. Once the port is accessed, a blood return should be checked. This helps ensure that the port  is in the vein and is not clogged.  If your port needs to remain accessed for a constant infusion, a clear (transparent) bandage will be placed over the needle site. The bandage and needle will need to be changed every week, or as directed by your health care provider.  Keep the bandage covering the needle clean and dry. Do not get it wet. Follow your health care provider's instructions on how to take a shower or bath while the port is accessed.  If your port does not need to stay accessed, no bandage is needed over the port.  What is flushing? Flushing helps keep the port from getting clogged. Follow your health care provider's instructions on how and when to flush the port. Ports are usually flushed with saline solution or a medicine called heparin. The need for flushing will depend on how the port is used.  If the port is used for intermittent medicines or blood draws, the port will need to be flushed: ? After medicines have been given. ? After blood has been drawn. ? As part of routine maintenance.  If a constant infusion is running, the port may not need to be flushed.  How long will my port stay implanted? The port can stay in for as long as your health care provider thinks it is needed. When it is time for the port to come out, surgery will be   done to remove it. The procedure is similar to the one performed when the port was put in. When should I seek immediate medical care? When you have an implanted port, you should seek immediate medical care if:  You notice a bad smell coming from the incision site.  You have swelling, redness, or drainage at the incision site.  You have more swelling or pain at the port site or the surrounding area.  You have a fever that is not controlled with medicine.  This information is not intended to replace advice given to you by your health care provider. Make sure you discuss any questions you have with your health care provider. Document  Released: 01/14/2005 Document Revised: 06/22/2015 Document Reviewed: 09/21/2012 Elsevier Interactive Patient Education  2017 [employer]  

## 2016-05-31 ENCOUNTER — Ambulatory Visit (HOSPITAL_BASED_OUTPATIENT_CLINIC_OR_DEPARTMENT_OTHER): Payer: Medicare Other

## 2016-05-31 ENCOUNTER — Encounter: Payer: Self-pay | Admitting: Hematology and Oncology

## 2016-05-31 VITALS — BP 154/58 | HR 85 | Temp 97.6°F | Resp 19

## 2016-05-31 DIAGNOSIS — Z5111 Encounter for antineoplastic chemotherapy: Secondary | ICD-10-CM

## 2016-05-31 DIAGNOSIS — C8588 Other specified types of non-Hodgkin lymphoma, lymph nodes of multiple sites: Secondary | ICD-10-CM

## 2016-05-31 DIAGNOSIS — C8580 Other specified types of non-Hodgkin lymphoma, unspecified site: Secondary | ICD-10-CM

## 2016-05-31 MED ORDER — SODIUM CHLORIDE 0.9% FLUSH
10.0000 mL | INTRAVENOUS | Status: DC | PRN
  Administered 2016-05-31: 10 mL
  Filled 2016-05-31: qty 10

## 2016-05-31 MED ORDER — SODIUM CHLORIDE 0.9 % IV SOLN
Freq: Once | INTRAVENOUS | Status: AC
  Administered 2016-05-31: 10:00:00 via INTRAVENOUS

## 2016-05-31 MED ORDER — DEXAMETHASONE SODIUM PHOSPHATE 10 MG/ML IJ SOLN
10.0000 mg | Freq: Once | INTRAMUSCULAR | Status: AC
  Administered 2016-05-31: 10 mg via INTRAVENOUS

## 2016-05-31 MED ORDER — SODIUM CHLORIDE 0.9 % IV SOLN
83.0000 mg/m2 | Freq: Once | INTRAVENOUS | Status: AC
  Administered 2016-05-31: 150 mg via INTRAVENOUS
  Filled 2016-05-31: qty 30

## 2016-05-31 MED ORDER — HEPARIN SOD (PORK) LOCK FLUSH 100 UNIT/ML IV SOLN
500.0000 [IU] | Freq: Once | INTRAVENOUS | Status: AC | PRN
  Administered 2016-05-31: 500 [IU]
  Filled 2016-05-31: qty 5

## 2016-05-31 MED ORDER — DEXAMETHASONE SODIUM PHOSPHATE 10 MG/ML IJ SOLN
INTRAMUSCULAR | Status: AC
  Filled 2016-05-31: qty 1

## 2016-05-31 NOTE — Assessment & Plan Note (Signed)
She has excellent response to treatment with minimum side effects, except for anemia I recommend we complete cycle number 4 now and then switch her over to maintenance treatment The risks, benefits, side effects and maintenance treatment is discussed with the patient and she agreed to proceed

## 2016-05-31 NOTE — Assessment & Plan Note (Signed)
She has poorly controlled diabetes.  Plan to add regular insulin during treatment.

## 2016-05-31 NOTE — Assessment & Plan Note (Signed)
The patient has intermittent, recurrence of anemia, likely due to anemia of chronic disease due to poorly controlled diabetes and mild chronic kidney disease She is not symptomatic. Observe only for now 

## 2016-05-31 NOTE — Patient Instructions (Signed)
[facility] Discharge Instructions for Patients Receiving Chemotherapy  Today you received the following chemotherapy agents Treanda.   To help prevent nausea and vomiting after your treatment, we encourage you to take your nausea medication as directed.    If you develop nausea and vomiting that is not controlled by your nausea medication, call the clinic.   BELOW ARE SYMPTOMS THAT SHOULD BE REPORTED IMMEDIATELY:  *FEVER GREATER THAN 100.5 F  *CHILLS WITH OR WITHOUT FEVER  NAUSEA AND VOMITING THAT IS NOT CONTROLLED WITH YOUR NAUSEA MEDICATION  *UNUSUAL SHORTNESS OF BREATH  *UNUSUAL BRUISING OR BLEEDING  TENDERNESS IN MOUTH AND THROAT WITH OR WITHOUT PRESENCE OF ULCERS  *URINARY PROBLEMS  *BOWEL PROBLEMS  UNUSUAL RASH Items with * indicate a potential emergency and should be followed up as soon as possible.  Feel free to call the clinic you have any questions or concerns. The clinic phone number is [phone].  Please show the [facility] at check-in to the Emergency Department and triage nurse.

## 2016-05-31 NOTE — Assessment & Plan Note (Signed)
Her kidney function tests is stable. Continue close monitoring. There is no contraindication to continue treatment for now.  

## 2016-05-31 NOTE — Assessment & Plan Note (Signed)
Her blood pressure is better controlled Continue same treatment for now

## 2016-05-31 NOTE — Progress Notes (Signed)
[facility] OFFICE PROGRESS NOTE  Patient Care Team: Aretta Nip, MD as PCP - General (Family Medicine) Sharyne Peach, MD as Consulting Physician (Ophthalmology) Inda Castle, MD as Consulting Physician (Gastroenterology) Theodis Sato, MD (Inactive) as Consulting Physician (Orthopedic Surgery) Jannette Spanner, MD as Referring Physician (Dermatology) Heath Lark, MD as Consulting Physician (Hematology and Oncology) Alphonsa Overall, MD as Consulting Physician (General Surgery)  SUMMARY OF ONCOLOGIC HISTORY: Oncology History   Marginal zone lymphoma, with lymph node and bone marrow involvement along with splenomegaly   Primary site: Lymphoid Neoplasms   Staging method: AJCC 6th Edition   Clinical: Stage IV signed by Heath Lark, MD on 09/02/2013  7:38 PM   Summary: Stage IV       Marginal zone lymphoma ([place])   07/29/2013 Imaging    CT scan shows splenomegaly and abnormal lymphadenopathy.      08/13/2013 Imaging    PET CT scan confirmed lymphadenopathy and splenomegaly, those areas are PET avid      08/23/2013 Surgery    [id number] left axillary lymph node biopsy confirmed a low-grade lymphoma.      08/27/2013 Bone Marrow Biopsy    Bone marrow biopsy confirmed lymphoma.[id number]      09/08/2013 - 09/29/2013 Chemotherapy    She completed 4 cycles of rituximab with resolution of splenomegaly.      12/29/2013 Imaging    PET scan show complete response.      07/11/2014 Imaging    PET scan showed recurrent disease      07/19/2014 - 08/09/2014 Chemotherapy    She completed 4 cycles of rituximab with resolution of splenomegaly      10/12/2014 Imaging    Repeat PET/CT scan showed near complete response to treatment.      10/13/2014 - 02/09/2015 Chemotherapy    She is placed on rituximab maintenance therapy.      03/29/2015 Imaging    PET CT showed possible mild progression of splenomegaly      04/26/2015 - 02/22/2016 Chemotherapy    She started taking  Ibrutinib      04/28/2015 Adverse Reaction    Treatment was placed temporarily on hold due to severe diarrhea and resumed at reduced dose      07/03/2015 PET scan    No hypermetabolic adenopathy in the neck, chest, abdomen or pelvis.2. Spleen is not hypermetabolic and appears slightly less prominent size.      02/06/2016 PET scan    Hypermetabolic LEFT axillary lymph nodes. Concern for HIGH-GRADE LYMPHOMA LOCALIZED RECURRENCE. 2. Prominent spleen without hypermetabolic activity. No change in size from comparison. 3. No additional hypermetabolic adenopathy on scan.      02/19/2016 Procedure    Technically successful ultrasound-guided core biopsy, left axillary adenopathy.      02/19/2016 Pathology Results    Lymph node, needle/core biopsy, left axilla - ATYPICAL LYMPHOID PROLIFERATION SUSPICIOUS FOR LYMPHOMA. - SEE COMMENT. Microscopic Comment The sections show multiple very small and skinny fragments of lymph nodal tissue displaying areas of lymphoid expansion by a population of small to medium size lymphocytes with round to irregular nuclei, scanty to moderately abundant cytoplasm, dense chromatin and inconspicuous nucleoli. This is associated with scattering of "naked" germinal centers with abundant tingible body macrophages. To further evaluate this process, flow cytometric analysis was performed and shows a minor population of monoclonal B cells expressing pan B-cell antigens including CD20 with associated kappa light chain restriction. In addition, immunohistochemical stains were performed including CD20, CD79a, CD3, CD5, CD10, BCL-2, CD21  with appropriate controls. The stains show a mixture of T and B cells with slight predominance of B cells. CD21 highlights numerous areas with dendritic networks, correlating with the presence of germinal centers. CD10 shows focal positivity likely in germinal centers. BCL-2 is diffusely positive except in areas of apparent germinal centers. The overall  features are atypical and suspicious for involvement by a B-cell lymphoproliferative process, particularly given the B-cell monoclonality by flow cytometry. However, morphologic evaluation is extremely limited due to small biopsy fragments and additional material (excisional biopsy) is strongly recommended for further evaluation. (BNS:ecj 02/21/2016)       03/04/2016 Procedure    Placement of single lumen port a cath via right internal jugular vein. The catheter tip lies at the cavo-atrial junction. A power injectable port a cath was placed and is ready for immediate use.      03/07/2016 - 05/31/2016 Chemotherapy    She received treatment with bendamustine and Gazyva      05/27/2016 PET scan    1. No evidence of active lymphoma on whole-body scan. 2. Resolution of metabolic activity previously identified in the LEFT axilla. 3. Small amount free fluid the pelvis.       INTERVAL HISTORY: Please see below for problem oriented charting. She returns with a friend to review imaging study and to proceed with cycle 4 treatment She tolerated treatment well Her blood pressure is better controlled with additional blood pressure medications Appetite is stable, no recent weight loss Denies nausea or vomiting  REVIEW OF SYSTEMS:   Constitutional: Denies fevers, chills or abnormal weight loss Eyes: Denies blurriness of vision Ears, nose, mouth, throat, and face: Denies mucositis or sore throat Respiratory: Denies cough, dyspnea or wheezes Cardiovascular: Denies palpitation, chest discomfort or lower extremity swelling Gastrointestinal:  Denies nausea, heartburn or change in bowel habits Skin: Denies abnormal skin rashes Lymphatics: Denies new lymphadenopathy or easy bruising Neurological:Denies numbness, tingling or new weaknesses Behavioral/Psych: Mood is stable, no new changes  All other systems were reviewed with the patient and are negative.  I have reviewed the past medical history, past  surgical history, social history and family history with the patient and they are unchanged from previous note.  ALLERGIES:  is allergic to aspirin; capoten [captopril]; levemir [insulin detemir]; micronase [glyburide]; onglyza [saxagliptin]; statins; and zocor [simvastatin].  MEDICATIONS:  Current Outpatient Prescriptions  Medication Sig Dispense Refill  . amLODipine (NORVASC) 10 MG tablet Take 1 tablet (10 mg total) by mouth at bedtime. 90 tablet 3  . aspirin 81 MG tablet Take 40.5 mg by mouth 3 (three) times a week.    . Calcium Carbonate-Vit D-Min (CALCIUM 1200 PO) Take 1 tablet by mouth daily. Per patient takes in am and pm    . cholecalciferol (VITAMIN D) 1000 UNITS tablet Take 1,000 Units by mouth daily.     . enalapril (VASOTEC) 20 MG tablet Take 20 mg by mouth every morning.    . fish oil-omega-3 fatty acids 1000 MG capsule Take 1 g by mouth daily.     Marland Kitchen glimepiride (AMARYL) 4 MG tablet Take 4 mg by mouth daily with breakfast. Per patient takes in am and pm.    . glucose blood test strip Use as instructed 100 each 12  . lidocaine-prilocaine (EMLA) cream Apply 1 application topically as needed. 30 g 3  . loperamide (IMODIUM) 2 MG capsule Take by mouth as needed for diarrhea or loose stools.    Marland Kitchen LORazepam (ATIVAN) 0.5 MG tablet Take 1 tablet (0.5  mg total) by mouth every 8 (eight) hours as needed for anxiety. 30 tablet 0  . Magnesium 500 MG CAPS Take 500 mg by mouth daily.     . metFORMIN (GLUCOPHAGE) 1000 MG tablet Take 1,000 mg by mouth 2 (two) times daily with a meal.     . Misc Natural Products (LUTEIN 20 PO) Take by mouth daily.    . Multiple Vitamins-Minerals (CENTRUM CARDIO) TABS Take 1 tablet by mouth daily.    . Multiple Vitamins-Minerals (ICAPS) CAPS Take by mouth daily.     No current facility-administered medications for this visit.     PHYSICAL EXAMINATION: ECOG PERFORMANCE STATUS: 0 - Asymptomatic  Vitals:   05/30/16 0906  BP: (!) 153/59  Pulse: 96  Resp: 18   Temp: 97.8 F (36.6 C)   Filed Weights   05/30/16 0906  Weight: 149 lb 11.2 oz (67.9 kg)    GENERAL:alert, no distress and comfortable SKIN: skin color, texture, turgor are normal, no rashes or significant lesions EYES: normal, Conjunctiva are pink and non-injected, sclera clear OROPHARYNX:no exudate, no erythema and lips, buccal mucosa, and tongue normal  NECK: supple, thyroid normal size, non-tender, without nodularity LYMPH:  no palpable lymphadenopathy in the cervical, axillary or inguinal LUNGS: clear to auscultation and percussion with normal breathing effort HEART: regular rate & rhythm and no murmurs and no lower extremity edema ABDOMEN:abdomen soft, non-tender and normal bowel sounds Musculoskeletal:no cyanosis of digits and no clubbing  NEURO: alert & oriented x 3 with fluent speech, no focal motor/sensory deficits  LABORATORY DATA:  I have reviewed the data as listed    Component Value Date/Time   NA 138 05/30/2016 0823   K 4.6 05/30/2016 0823   CL 106 02/19/2016 1120   CO2 25 05/30/2016 0823   GLUCOSE 246 (H) 05/30/2016 0823   BUN 16.3 05/30/2016 0823   CREATININE 1.0 05/30/2016 0823   CALCIUM 9.4 05/30/2016 0823   PROT 6.5 05/30/2016 0823   ALBUMIN 3.7 05/30/2016 0823   AST 20 05/30/2016 0823   ALT 20 05/30/2016 0823   ALKPHOS 95 05/30/2016 0823   BILITOT 0.52 05/30/2016 0823   GFRNONAA >60 02/19/2016 1120   GFRNONAA 47 (L) 05/18/2013 1256   GFRAA >60 02/19/2016 1120   GFRAA 55 (L) 05/18/2013 1256    No results found for: SPEP, UPEP  Lab Results  Component Value Date   WBC 5.1 05/30/2016   NEUTROABS 3.7 05/30/2016   HGB 9.7 (L) 05/30/2016   HCT 28.2 (L) 05/30/2016   MCV 83.9 05/30/2016   PLT 159 05/30/2016      Chemistry      Component Value Date/Time   NA 138 05/30/2016 0823   K 4.6 05/30/2016 0823   CL 106 02/19/2016 1120   CO2 25 05/30/2016 0823   BUN 16.3 05/30/2016 0823   CREATININE 1.0 05/30/2016 0823   GLU 202 (H) 08/09/2014 1000       Component Value Date/Time   CALCIUM 9.4 05/30/2016 0823   ALKPHOS 95 05/30/2016 0823   AST 20 05/30/2016 0823   ALT 20 05/30/2016 0823   BILITOT 0.52 05/30/2016 0823       RADIOGRAPHIC STUDIES: I have personally reviewed the radiological images as listed and agreed with the findings in the report. Nm Pet Image Restag (ps) Skull Base To Thigh  Result Date: 05/27/2016 CLINICAL DATA:  Subsequent treatment strategy for marginal zone lymphoma. Recurrence in LEFT axillary node. Patient status post bendamustine and Gazyva. EXAM: NUCLEAR MEDICINE PET SKULL  BASE TO THIGH TECHNIQUE: 7.4 mCi F-18 FDG was injected intravenously. Full-ring PET imaging was performed from the skull base to thigh after the radiotracer. CT data was obtained and used for attenuation correction and anatomic localization. FASTING BLOOD GLUCOSE:  Value: 178 mg/dl COMPARISON:  PET-CT scan 02/06/2016 FINDINGS: NECK No hypermetabolic lymph nodes in the neck. CHEST Resolution of the hypermetabolic LEFT axial lymph nodes. No hypermetabolic axillary, supra-clavicular mediastinal nodes. No hilar adenopathy. Port in the RIGHT chest wall. No suspicious nodularity with the lungs. ABDOMEN/PELVIS Spleen is normal size and metabolic activity. No abnormal activity in the liver. Gallbladder is distended to 4.9 cm. No hypermetabolic abdominal periportal lymph nodes. No hypermetabolic retroperitoneal lymph nodes or inguinal lymph nodes. Smaller free fluid the pelvis.  Uterus and ovaries normal. SKELETON No focal hypermetabolic activity to suggest skeletal metastasis. IMPRESSION: 1. No evidence of active lymphoma on whole-body scan. 2. Resolution of metabolic activity previously identified in the LEFT axilla. 3. Small amount free fluid the pelvis. Electronically Signed   By: Suzy Bouchard M.D.   On: 05/27/2016 15:17    ASSESSMENT & PLAN:  Marginal zone lymphoma She has excellent response to treatment with minimum side effects, except for  anemia I recommend we complete cycle number 4 now and then switch her over to maintenance treatment The risks, benefits, side effects and maintenance treatment is discussed with the patient and she agreed to proceed  Anemia in neoplastic disease The patient has intermittent, recurrence of anemia, likely due to anemia of chronic disease due to poorly controlled diabetes and mild chronic kidney disease She is not symptomatic. Observe only for now  Chronic kidney disease (CKD), stage III (moderate) Her kidney function tests is stable. Continue close monitoring. There is no contraindication to continue treatment for now.   Type 2 diabetes mellitus with diabetic nephropathy She has poorly controlled diabetes.  Plan to add regular insulin during treatment.  Essential hypertension Her blood pressure is better controlled Continue same treatment for now   Orders Placed This Encounter  Procedures  . CBC with Differential    Standing Status:   Standing    Number of Occurrences:   20    Standing Expiration Date:   06/01/2017  . Comprehensive metabolic panel    Standing Status:   Standing    Number of Occurrences:   20    Standing Expiration Date:   06/01/2017   All questions were answered. The patient knows to call the clinic with any problems, questions or concerns. No barriers to learning was detected. I spent 25 minutes counseling the patient face to face. The total time spent in the appointment was 30 minutes and more than 50% was on counseling and review of test results     Heath Lark, MD 05/31/2016 5:33 PM

## 2016-06-15 ENCOUNTER — Telehealth: Payer: Self-pay

## 2016-06-15 NOTE — Telephone Encounter (Signed)
Spoke with patient and she is aware of her appts per inbox

## 2016-07-04 ENCOUNTER — Telehealth: Payer: Self-pay | Admitting: Hematology and Oncology

## 2016-07-04 ENCOUNTER — Ambulatory Visit (HOSPITAL_BASED_OUTPATIENT_CLINIC_OR_DEPARTMENT_OTHER): Payer: Medicare Other

## 2016-07-04 ENCOUNTER — Other Ambulatory Visit (HOSPITAL_BASED_OUTPATIENT_CLINIC_OR_DEPARTMENT_OTHER): Payer: Medicare Other

## 2016-07-04 ENCOUNTER — Encounter: Payer: Self-pay | Admitting: Hematology and Oncology

## 2016-07-04 ENCOUNTER — Ambulatory Visit (HOSPITAL_BASED_OUTPATIENT_CLINIC_OR_DEPARTMENT_OTHER): Payer: Medicare Other | Admitting: Hematology and Oncology

## 2016-07-04 ENCOUNTER — Ambulatory Visit: Payer: Medicare Other

## 2016-07-04 VITALS — BP 151/58 | HR 104 | Temp 98.6°F | Resp 17

## 2016-07-04 DIAGNOSIS — C8588 Other specified types of non-Hodgkin lymphoma, lymph nodes of multiple sites: Secondary | ICD-10-CM

## 2016-07-04 DIAGNOSIS — E1121 Type 2 diabetes mellitus with diabetic nephropathy: Secondary | ICD-10-CM

## 2016-07-04 DIAGNOSIS — C8580 Other specified types of non-Hodgkin lymphoma, unspecified site: Secondary | ICD-10-CM

## 2016-07-04 DIAGNOSIS — D630 Anemia in neoplastic disease: Secondary | ICD-10-CM

## 2016-07-04 DIAGNOSIS — Z5112 Encounter for antineoplastic immunotherapy: Secondary | ICD-10-CM

## 2016-07-04 DIAGNOSIS — Z95828 Presence of other vascular implants and grafts: Secondary | ICD-10-CM

## 2016-07-04 LAB — COMPREHENSIVE METABOLIC PANEL
ALT: 24 U/L (ref 0–55)
ANION GAP: 10 meq/L (ref 3–11)
AST: 22 U/L (ref 5–34)
Albumin: 3.8 g/dL (ref 3.5–5.0)
Alkaline Phosphatase: 77 U/L (ref 40–150)
BUN: 15.9 mg/dL (ref 7.0–26.0)
CHLORIDE: 102 meq/L (ref 98–109)
CO2: 25 mEq/L (ref 22–29)
CREATININE: 1.1 mg/dL (ref 0.6–1.1)
Calcium: 9.9 mg/dL (ref 8.4–10.4)
EGFR: 50 mL/min/{1.73_m2} — ABNORMAL LOW (ref >90)
Glucose: 194 mg/dl — ABNORMAL HIGH (ref 70–140)
Potassium: 4.6 mEq/L (ref 3.5–5.1)
Sodium: 137 mEq/L (ref 136–145)
Total Bilirubin: 0.6 mg/dL (ref 0.20–1.20)
Total Protein: 6.5 g/dL (ref 6.4–8.3)

## 2016-07-04 LAB — CBC WITH DIFFERENTIAL/PLATELET
BASO%: 0.7 % (ref 0.0–2.0)
Basophils Absolute: 0 10*3/uL (ref 0.0–0.1)
EOS%: 2.9 % (ref 0.0–7.0)
Eosinophils Absolute: 0.1 10*3/uL (ref 0.0–0.5)
HCT: 28.2 % — ABNORMAL LOW (ref 34.8–46.6)
HGB: 9.7 g/dL — ABNORMAL LOW (ref 11.6–15.9)
LYMPH%: 21.6 % (ref 14.0–49.7)
MCH: 29.5 pg (ref 25.1–34.0)
MCHC: 34.4 g/dL (ref 31.5–36.0)
MCV: 85.7 fL (ref 79.5–101.0)
MONO#: 0.4 10*3/uL (ref 0.1–0.9)
MONO%: 9 % (ref 0.0–14.0)
NEUT#: 2.9 10*3/uL (ref 1.5–6.5)
NEUT%: 65.8 % (ref 38.4–76.8)
PLATELETS: 162 10*3/uL (ref 145–400)
RBC: 3.29 10*6/uL — AB (ref 3.70–5.45)
RDW: 14.9 % — ABNORMAL HIGH (ref 11.2–14.5)
WBC: 4.4 10*3/uL (ref 3.9–10.3)
lymph#: 1 10*3/uL (ref 0.9–3.3)

## 2016-07-04 MED ORDER — DEXAMETHASONE SODIUM PHOSPHATE 10 MG/ML IJ SOLN
INTRAMUSCULAR | Status: AC
Start: 2016-07-04 — End: 2016-07-04
  Filled 2016-07-04: qty 1

## 2016-07-04 MED ORDER — SODIUM CHLORIDE 0.9% FLUSH
10.0000 mL | INTRAVENOUS | Status: DC | PRN
  Administered 2016-07-04: 10 mL
  Filled 2016-07-04: qty 10

## 2016-07-04 MED ORDER — HEPARIN SOD (PORK) LOCK FLUSH 100 UNIT/ML IV SOLN
500.0000 [IU] | Freq: Once | INTRAVENOUS | Status: AC | PRN
  Administered 2016-07-04: 500 [IU]
  Filled 2016-07-04: qty 5

## 2016-07-04 MED ORDER — SODIUM CHLORIDE 0.9% FLUSH
10.0000 mL | Freq: Once | INTRAVENOUS | Status: AC
  Administered 2016-07-04: 10 mL
  Filled 2016-07-04: qty 10

## 2016-07-04 MED ORDER — SODIUM CHLORIDE 0.9 % IV SOLN: 10.0000 mg | Freq: Once | INTRAVENOUS | Status: DC

## 2016-07-04 MED ORDER — ACETAMINOPHEN 325 MG PO TABS
ORAL_TABLET | ORAL | Status: AC
  Filled 2016-07-04: qty 2

## 2016-07-04 MED ORDER — DIPHENHYDRAMINE HCL 50 MG/ML IJ SOLN
50.0000 mg | Freq: Once | INTRAMUSCULAR | Status: AC
  Administered 2016-07-04: 50 mg via INTRAVENOUS

## 2016-07-04 MED ORDER — ACETAMINOPHEN 325 MG PO TABS
650.0000 mg | ORAL_TABLET | Freq: Once | ORAL | Status: AC
  Administered 2016-07-04: 650 mg via ORAL

## 2016-07-04 MED ORDER — SODIUM CHLORIDE 0.9 % IV SOLN
1000.0000 mg | Freq: Once | INTRAVENOUS | Status: AC
  Administered 2016-07-04: 1000 mg via INTRAVENOUS
  Filled 2016-07-04: qty 40

## 2016-07-04 MED ORDER — DEXAMETHASONE SODIUM PHOSPHATE 10 MG/ML IJ SOLN
10.0000 mg | Freq: Once | INTRAMUSCULAR | Status: AC
  Administered 2016-07-04: 10 mg via INTRAVENOUS

## 2016-07-04 MED ORDER — DIPHENHYDRAMINE HCL 50 MG/ML IJ SOLN
INTRAMUSCULAR | Status: AC
  Filled 2016-07-04: qty 1

## 2016-07-04 NOTE — Assessment & Plan Note (Signed)
She has poorly controlled diabetes.  Plan to add regular insulin during treatment if needed

## 2016-07-04 NOTE — Assessment & Plan Note (Addendum)
She has excellent response to treatment with minimum side effects, except for anemia The risks, benefits, side effects and maintenance treatment is discussed with the patient and she agreed to proceed Clinically, she has no signs of disease. I plan to repeat imaging study again at the end of the year

## 2016-07-04 NOTE — Patient Instructions (Signed)
[facility] Discharge Instructions for Patients Receiving Chemotherapy  Today you received the following chemotherapy agents:  Gazyva (obinutuzumab)  To help prevent nausea and vomiting after your treatment, we encourage you to take your nausea medication as prescribed.   If you develop nausea and vomiting that is not controlled by your nausea medication, call the clinic.   BELOW ARE SYMPTOMS THAT SHOULD BE REPORTED IMMEDIATELY:  *FEVER GREATER THAN 100.5 F  *CHILLS WITH OR WITHOUT FEVER  NAUSEA AND VOMITING THAT IS NOT CONTROLLED WITH YOUR NAUSEA MEDICATION  *UNUSUAL SHORTNESS OF BREATH  *UNUSUAL BRUISING OR BLEEDING  TENDERNESS IN MOUTH AND THROAT WITH OR WITHOUT PRESENCE OF ULCERS  *URINARY PROBLEMS  *BOWEL PROBLEMS  UNUSUAL RASH Items with * indicate a potential emergency and should be followed up as soon as possible.  Feel free to call the clinic you have any questions or concerns. The clinic phone number is [phone].  Please show the [facility] at check-in to the Emergency Department and triage nurse.

## 2016-07-04 NOTE — Assessment & Plan Note (Signed)
The patient has intermittent, recurrence of anemia, likely due to anemia of chronic disease due to poorly controlled diabetes and mild chronic kidney disease She is not symptomatic. Observe only for now 

## 2016-07-04 NOTE — Progress Notes (Signed)
[place] OFFICE PROGRESS NOTE  Patient Care Team: [name], Bill Salinas, MD as PCP - General (Family Medicine) Sharyne Peach, MD as Consulting Physician (Ophthalmology) Inda Castle, MD as Consulting Physician (Gastroenterology) Sypher, Herbie Baltimore, MD (Inactive) as Consulting Physician (Orthopedic Surgery) Jannette Spanner, MD as Referring Physician (Dermatology) Heath Lark, MD as Consulting Physician (Hematology and Oncology) Alphonsa Overall, MD as Consulting Physician (General Surgery)  SUMMARY OF ONCOLOGIC HISTORY: Oncology History   Marginal zone lymphoma, with lymph node and bone marrow involvement along with splenomegaly   Primary site: Lymphoid Neoplasms   Staging method: AJCC 6th Edition   Clinical: Stage IV signed by Heath Lark, MD on 09/02/2013  7:38 PM   Summary: Stage IV       Marginal zone lymphoma ([place])   07/29/2013 Imaging    CT scan shows splenomegaly and abnormal lymphadenopathy.      08/13/2013 Imaging    PET CT scan confirmed lymphadenopathy and splenomegaly, those areas are PET avid      08/23/2013 Surgery    [id number] left axillary lymph node biopsy confirmed a low-grade lymphoma.      08/27/2013 Bone Marrow Biopsy    Bone marrow biopsy confirmed lymphoma.[id number]      09/08/2013 - 09/29/2013 Chemotherapy    She completed 4 cycles of rituximab with resolution of splenomegaly.      12/29/2013 Imaging    PET scan show complete response.      07/11/2014 Imaging    PET scan showed recurrent disease      07/19/2014 - 08/09/2014 Chemotherapy    She completed 4 cycles of rituximab with resolution of splenomegaly      10/12/2014 Imaging    Repeat PET/CT scan showed near complete response to treatment.      10/13/2014 - 02/09/2015 Chemotherapy    She is placed on rituximab maintenance therapy.      03/29/2015 Imaging    PET CT showed possible mild progression of splenomegaly      04/26/2015 - 02/22/2016 Chemotherapy    She started taking  Ibrutinib      04/28/2015 Adverse Reaction    Treatment was placed temporarily on hold due to severe diarrhea and resumed at reduced dose      07/03/2015 PET scan    No hypermetabolic adenopathy in the neck, chest, abdomen or pelvis.2. Spleen is not hypermetabolic and appears slightly less prominent size.      02/06/2016 PET scan    Hypermetabolic LEFT axillary lymph nodes. Concern for HIGH-GRADE LYMPHOMA LOCALIZED RECURRENCE. 2. Prominent spleen without hypermetabolic activity. No change in size from comparison. 3. No additional hypermetabolic adenopathy on scan.      02/19/2016 Procedure    Technically successful ultrasound-guided core biopsy, left axillary adenopathy.      02/19/2016 Pathology Results    Lymph node, needle/core biopsy, left axilla - ATYPICAL LYMPHOID PROLIFERATION SUSPICIOUS FOR LYMPHOMA. - SEE COMMENT. Microscopic Comment The sections show multiple very small and skinny fragments of lymph nodal tissue displaying areas of lymphoid expansion by a population of small to medium size lymphocytes with round to irregular nuclei, scanty to moderately abundant cytoplasm, dense chromatin and inconspicuous nucleoli. This is associated with scattering of "naked" germinal centers with abundant tingible body macrophages. To further evaluate this process, flow cytometric analysis was performed and shows a minor population of monoclonal B cells expressing pan B-cell antigens including CD20 with associated kappa light chain restriction. In addition, immunohistochemical stains were performed including CD20, CD79a, CD3, CD5, CD10, BCL-2, CD21  with appropriate controls. The stains show a mixture of T and B cells with slight predominance of B cells. CD21 highlights numerous areas with dendritic networks, correlating with the presence of germinal centers. CD10 shows focal positivity likely in germinal centers. BCL-2 is diffusely positive except in areas of apparent germinal centers. The overall  features are atypical and suspicious for involvement by a B-cell lymphoproliferative process, particularly given the B-cell monoclonality by flow cytometry. However, morphologic evaluation is extremely limited due to small biopsy fragments and additional material (excisional biopsy) is strongly recommended for further evaluation. (BNS:ecj 02/21/2016)       03/04/2016 Procedure    Placement of single lumen port a cath via right internal jugular vein. The catheter tip lies at the cavo-atrial junction. A power injectable port a cath was placed and is ready for immediate use.      03/07/2016 - 05/31/2016 Chemotherapy    She received treatment with bendamustine and Gazyva      05/27/2016 PET scan    1. No evidence of active lymphoma on whole-body scan. 2. Resolution of metabolic activity previously identified in the LEFT axilla. 3. Small amount free fluid the pelvis.       INTERVAL HISTORY: Please see below for problem oriented charting. She is seen prior to maintenance treatment She is doing well and is active She have occasional fatigue No new lymphadenopathy Denies recent infection. She felt that her blood sugar is under good control. The patient denies any recent signs or symptoms of bleeding such as spontaneous epistaxis, hematuria or hematochezia.   REVIEW OF SYSTEMS:   Constitutional: Denies fevers, chills or abnormal weight loss Eyes: Denies blurriness of vision Ears, nose, mouth, throat, and face: Denies mucositis or sore throat Respiratory: Denies cough, dyspnea or wheezes Cardiovascular: Denies palpitation, chest discomfort or lower extremity swelling Gastrointestinal:  Denies nausea, heartburn or change in bowel habits Skin: Denies abnormal skin rashes Lymphatics: Denies new lymphadenopathy or easy bruising Neurological:Denies numbness, tingling or new weaknesses Behavioral/Psych: Mood is stable, no new changes  All other systems were reviewed with the patient and are  negative.  I have reviewed the past medical history, past surgical history, social history and family history with the patient and they are unchanged from previous note.  ALLERGIES:  is allergic to aspirin; capoten [captopril]; levemir [insulin detemir]; micronase [glyburide]; onglyza [saxagliptin]; statins; and zocor [simvastatin].  MEDICATIONS:  Current Outpatient Prescriptions  Medication Sig Dispense Refill  . amLODipine (NORVASC) 10 MG tablet Take 1 tablet (10 mg total) by mouth at bedtime. 90 tablet 3  . aspirin 81 MG tablet Take 40.5 mg by mouth 3 (three) times a week.    . Calcium Carbonate-Vit D-Min (CALCIUM 1200 PO) Take 1 tablet by mouth daily. Per patient takes in am and pm    . cholecalciferol (VITAMIN D) 1000 UNITS tablet Take 1,000 Units by mouth daily.     . enalapril (VASOTEC) 20 MG tablet Take 20 mg by mouth every morning.    . fish oil-omega-3 fatty acids 1000 MG capsule Take 1 g by mouth daily.     Marland Kitchen glimepiride (AMARYL) 4 MG tablet Take 4 mg by mouth daily with breakfast. Per patient takes in am and pm.    . glucose blood test strip Use as instructed 100 each 12  . lidocaine-prilocaine (EMLA) cream Apply 1 application topically as needed. 30 g 3  . loperamide (IMODIUM) 2 MG capsule Take by mouth as needed for diarrhea or loose stools.    Marland Kitchen  LORazepam (ATIVAN) 0.5 MG tablet Take 1 tablet (0.5 mg total) by mouth every 8 (eight) hours as needed for anxiety. 30 tablet 0  . Magnesium 500 MG CAPS Take 500 mg by mouth daily.     . metFORMIN (GLUCOPHAGE) 1000 MG tablet Take 1,000 mg by mouth 2 (two) times daily with a meal.     . Misc Natural Products (LUTEIN 20 PO) Take by mouth daily.    . Multiple Vitamins-Minerals (CENTRUM CARDIO) TABS Take 1 tablet by mouth daily.    . Multiple Vitamins-Minerals (ICAPS) CAPS Take by mouth daily.     No current facility-administered medications for this visit.    Facility-Administered Medications Ordered in Other Visits  Medication Dose  Route Frequency Provider Last Rate Last Dose  . sodium chloride flush (NS) 0.9 % injection 10 mL  10 mL Intracatheter PRN Alvy Bimler, Merick Kelleher, MD   10 mL at 07/04/16 1510    PHYSICAL EXAMINATION: ECOG PERFORMANCE STATUS: 1 - Symptomatic but completely ambulatory GENERAL:alert, no distress and comfortable SKIN: skin color, texture, turgor are normal, no rashes or significant lesions EYES: normal, Conjunctiva are pink and non-injected, sclera clear OROPHARYNX:no exudate, no erythema and lips, buccal mucosa, and tongue normal  NECK: supple, thyroid normal size, non-tender, without nodularity LYMPH:  no palpable lymphadenopathy in the cervical, axillary or inguinal LUNGS: clear to auscultation and percussion with normal breathing effort HEART: regular rate & rhythm and no murmurs and no lower extremity edema ABDOMEN:abdomen soft, non-tender and normal bowel sounds Musculoskeletal:no cyanosis of digits and no clubbing  NEURO: alert & oriented x 3 with fluent speech, no focal motor/sensory deficits  LABORATORY DATA:  I have reviewed the data as listed    Component Value Date/Time   NA 137 07/04/2016 0843   K 4.6 07/04/2016 0843   CL 106 02/19/2016 1120   CO2 25 07/04/2016 0843   GLUCOSE 194 (H) 07/04/2016 0843   BUN 15.9 07/04/2016 0843   CREATININE 1.1 07/04/2016 0843   CALCIUM 9.9 07/04/2016 0843   PROT 6.5 07/04/2016 0843   ALBUMIN 3.8 07/04/2016 0843   AST 22 07/04/2016 0843   ALT 24 07/04/2016 0843   ALKPHOS 77 07/04/2016 0843   BILITOT 0.60 07/04/2016 0843   GFRNONAA >60 02/19/2016 1120   GFRNONAA 47 (L) 05/18/2013 1256   GFRAA >60 02/19/2016 1120   GFRAA 55 (L) 05/18/2013 1256    No results found for: SPEP, UPEP  Lab Results  Component Value Date   WBC 4.4 07/04/2016   NEUTROABS 2.9 07/04/2016   HGB 9.7 (L) 07/04/2016   HCT 28.2 (L) 07/04/2016   MCV 85.7 07/04/2016   PLT 162 07/04/2016      Chemistry      Component Value Date/Time   NA 137 07/04/2016 0843   K 4.6  07/04/2016 0843   CL 106 02/19/2016 1120   CO2 25 07/04/2016 0843   BUN 15.9 07/04/2016 0843   CREATININE 1.1 07/04/2016 0843   GLU 202 (H) 08/09/2014 1000      Component Value Date/Time   CALCIUM 9.9 07/04/2016 0843   ALKPHOS 77 07/04/2016 0843   AST 22 07/04/2016 0843   ALT 24 07/04/2016 0843   BILITOT 0.60 07/04/2016 0843       ASSESSMENT & PLAN:  Marginal zone lymphoma She has excellent response to treatment with minimum side effects, except for anemia The risks, benefits, side effects and maintenance treatment is discussed with the patient and she agreed to proceed Clinically, she has no signs  of disease. I plan to repeat imaging study again at the end of the year  Anemia in neoplastic disease The patient has intermittent, recurrence of anemia, likely due to anemia of chronic disease due to poorly controlled diabetes and mild chronic kidney disease She is not symptomatic. Observe only for now  Type 2 diabetes mellitus with diabetic nephropathy She has poorly controlled diabetes.  Plan to add regular insulin during treatment if needed   No orders of the defined types were placed in this encounter.  All questions were answered. The patient knows to call the clinic with any problems, questions or concerns. No barriers to learning was detected. I spent 15 minutes counseling the patient face to face. The total time spent in the appointment was 20 minutes and more than 50% was on counseling and review of test results     Heath Lark, MD 07/04/2016 5:15 PM

## 2016-07-04 NOTE — Patient Instructions (Signed)
Implanted Port Home Guide An implanted port is a type of central line that is placed under the skin. Central lines are used to provide IV access when treatment or nutrition needs to be given through a person's veins. Implanted ports are used for long-term IV access. An implanted port may be placed because:  You need IV medicine that would be irritating to the small veins in your hands or arms.  You need long-term IV medicines, such as antibiotics.  You need IV nutrition for a long period.  You need frequent blood draws for lab tests.  You need dialysis.  Implanted ports are usually placed in the chest area, but they can also be placed in the upper arm, the abdomen, or the leg. An implanted port has two main parts:  Reservoir. The reservoir is round and will appear as a small, raised area under your skin. The reservoir is the part where a needle is inserted to give medicines or draw blood.  Catheter. The catheter is a thin, flexible tube that extends from the reservoir. The catheter is placed into a large vein. Medicine that is inserted into the reservoir goes into the catheter and then into the vein.  How will I care for my incision site? Do not get the incision site wet. Bathe or shower as directed by your health care provider. How is my port accessed? Special steps must be taken to access the port:  Before the port is accessed, a numbing cream can be placed on the skin. This helps numb the skin over the port site.  Your health care provider uses a sterile technique to access the port. ? Your health care provider must put on a mask and sterile gloves. ? The skin over your port is cleaned carefully with an antiseptic and allowed to dry. ? The port is gently pinched between sterile gloves, and a needle is inserted into the port.  Only "non-coring" port needles should be used to access the port. Once the port is accessed, a blood return should be checked. This helps ensure that the port  is in the vein and is not clogged.  If your port needs to remain accessed for a constant infusion, a clear (transparent) bandage will be placed over the needle site. The bandage and needle will need to be changed every week, or as directed by your health care provider.  Keep the bandage covering the needle clean and dry. Do not get it wet. Follow your health care provider's instructions on how to take a shower or bath while the port is accessed.  If your port does not need to stay accessed, no bandage is needed over the port.  What is flushing? Flushing helps keep the port from getting clogged. Follow your health care provider's instructions on how and when to flush the port. Ports are usually flushed with saline solution or a medicine called heparin. The need for flushing will depend on how the port is used.  If the port is used for intermittent medicines or blood draws, the port will need to be flushed: ? After medicines have been given. ? After blood has been drawn. ? As part of routine maintenance.  If a constant infusion is running, the port may not need to be flushed.  How long will my port stay implanted? The port can stay in for as long as your health care provider thinks it is needed. When it is time for the port to come out, surgery will be   done to remove it. The procedure is similar to the one performed when the port was put in. When should I seek immediate medical care? When you have an implanted port, you should seek immediate medical care if:  You notice a bad smell coming from the incision site.  You have swelling, redness, or drainage at the incision site.  You have more swelling or pain at the port site or the surrounding area.  You have a fever that is not controlled with medicine.  This information is not intended to replace advice given to you by your health care provider. Make sure you discuss any questions you have with your health care provider. Document  Released: 01/14/2005 Document Revised: 06/22/2015 Document Reviewed: 09/21/2012 Elsevier Interactive Patient Education  2017 [employer]  

## 2016-07-04 NOTE — Telephone Encounter (Signed)
Gave patient AVS and calender per 6/7 los . 

## 2016-08-29 ENCOUNTER — Other Ambulatory Visit: Payer: Self-pay | Admitting: Hematology and Oncology

## 2016-08-29 DIAGNOSIS — C8580 Other specified types of non-Hodgkin lymphoma, unspecified site: Secondary | ICD-10-CM

## 2016-09-03 ENCOUNTER — Ambulatory Visit (HOSPITAL_BASED_OUTPATIENT_CLINIC_OR_DEPARTMENT_OTHER): Payer: Medicare Other | Admitting: Hematology and Oncology

## 2016-09-03 ENCOUNTER — Other Ambulatory Visit (HOSPITAL_BASED_OUTPATIENT_CLINIC_OR_DEPARTMENT_OTHER): Payer: Medicare Other

## 2016-09-03 ENCOUNTER — Ambulatory Visit (HOSPITAL_BASED_OUTPATIENT_CLINIC_OR_DEPARTMENT_OTHER): Payer: Medicare Other

## 2016-09-03 ENCOUNTER — Ambulatory Visit: Payer: Medicare Other

## 2016-09-03 ENCOUNTER — Telehealth: Payer: Self-pay | Admitting: Hematology and Oncology

## 2016-09-03 VITALS — BP 138/61 | HR 96 | Temp 97.5°F | Resp 18

## 2016-09-03 DIAGNOSIS — C8580 Other specified types of non-Hodgkin lymphoma, unspecified site: Secondary | ICD-10-CM

## 2016-09-03 DIAGNOSIS — I10 Essential (primary) hypertension: Secondary | ICD-10-CM | POA: Diagnosis not present

## 2016-09-03 DIAGNOSIS — E1121 Type 2 diabetes mellitus with diabetic nephropathy: Secondary | ICD-10-CM

## 2016-09-03 DIAGNOSIS — C8588 Other specified types of non-Hodgkin lymphoma, lymph nodes of multiple sites: Secondary | ICD-10-CM | POA: Diagnosis not present

## 2016-09-03 DIAGNOSIS — Z5112 Encounter for antineoplastic immunotherapy: Secondary | ICD-10-CM

## 2016-09-03 DIAGNOSIS — Z95828 Presence of other vascular implants and grafts: Secondary | ICD-10-CM

## 2016-09-03 LAB — CBC WITH DIFFERENTIAL/PLATELET
BASO%: 1 % (ref 0.0–2.0)
Basophils Absolute: 0 10*3/uL (ref 0.0–0.1)
EOS%: 1.1 % (ref 0.0–7.0)
Eosinophils Absolute: 0.1 10*3/uL (ref 0.0–0.5)
HEMATOCRIT: 29.6 % — AB (ref 34.8–46.6)
HEMOGLOBIN: 10.1 g/dL — AB (ref 11.6–15.9)
LYMPH#: 0.4 10*3/uL — AB (ref 0.9–3.3)
LYMPH%: 8 % — ABNORMAL LOW (ref 14.0–49.7)
MCH: 29.8 pg (ref 25.1–34.0)
MCHC: 34.1 g/dL (ref 31.5–36.0)
MCV: 87.4 fL (ref 79.5–101.0)
MONO#: 0.3 10*3/uL (ref 0.1–0.9)
MONO%: 6.9 % (ref 0.0–14.0)
NEUT%: 83 % — ABNORMAL HIGH (ref 38.4–76.8)
NEUTROS ABS: 3.9 10*3/uL (ref 1.5–6.5)
Platelets: 217 10*3/uL (ref 145–400)
RBC: 3.39 10*6/uL — AB (ref 3.70–5.45)
RDW: 14.4 % (ref 11.2–14.5)
WBC: 4.7 10*3/uL (ref 3.9–10.3)

## 2016-09-03 LAB — COMPREHENSIVE METABOLIC PANEL
ALT: 18 U/L (ref 0–55)
AST: 18 U/L (ref 5–34)
Albumin: 3.5 g/dL (ref 3.5–5.0)
Alkaline Phosphatase: 65 U/L (ref 40–150)
Anion Gap: 9 mEq/L (ref 3–11)
BUN: 17.1 mg/dL (ref 7.0–26.0)
CALCIUM: 9.2 mg/dL (ref 8.4–10.4)
CHLORIDE: 106 meq/L (ref 98–109)
CO2: 25 meq/L (ref 22–29)
Creatinine: 1.2 mg/dL — ABNORMAL HIGH (ref 0.6–1.1)
EGFR: 43 mL/min/{1.73_m2} — ABNORMAL LOW (ref >90)
Glucose: 268 mg/dl — ABNORMAL HIGH (ref 70–140)
POTASSIUM: 4.2 meq/L (ref 3.5–5.1)
Sodium: 140 mEq/L (ref 136–145)
Total Bilirubin: 0.41 mg/dL (ref 0.20–1.20)
Total Protein: 6 g/dL — ABNORMAL LOW (ref 6.4–8.3)

## 2016-09-03 MED ORDER — SODIUM CHLORIDE 0.9% FLUSH
10.0000 mL | INTRAVENOUS | Status: DC | PRN
Start: 2016-09-03 — End: 2016-09-03
  Administered 2016-09-03: 10 mL
  Filled 2016-09-03: qty 10

## 2016-09-03 MED ORDER — SODIUM CHLORIDE 0.9 % IV SOLN
INTRAVENOUS | Status: DC
  Administered 2016-09-03: 10:00:00 via INTRAVENOUS

## 2016-09-03 MED ORDER — SODIUM CHLORIDE 0.9% FLUSH
10.0000 mL | Freq: Once | INTRAVENOUS | Status: AC
  Administered 2016-09-03: 10 mL
  Filled 2016-09-03: qty 10

## 2016-09-03 MED ORDER — ACETAMINOPHEN 325 MG PO TABS
ORAL_TABLET | ORAL | Status: AC
  Filled 2016-09-03: qty 2

## 2016-09-03 MED ORDER — SODIUM CHLORIDE 0.9 % IV SOLN
1000.0000 mg | Freq: Once | INTRAVENOUS | Status: AC
  Administered 2016-09-03: 1000 mg via INTRAVENOUS
  Filled 2016-09-03: qty 40

## 2016-09-03 MED ORDER — DIPHENHYDRAMINE HCL 50 MG/ML IJ SOLN
25.0000 mg | Freq: Once | INTRAMUSCULAR | Status: AC
  Administered 2016-09-03: 25 mg via INTRAVENOUS

## 2016-09-03 MED ORDER — DEXAMETHASONE SODIUM PHOSPHATE 10 MG/ML IJ SOLN
10.0000 mg | Freq: Once | INTRAMUSCULAR | Status: AC
  Administered 2016-09-03: 10 mg via INTRAVENOUS

## 2016-09-03 MED ORDER — HEPARIN SOD (PORK) LOCK FLUSH 100 UNIT/ML IV SOLN
500.0000 [IU] | Freq: Once | INTRAVENOUS | Status: AC | PRN
  Administered 2016-09-03: 500 [IU]
  Filled 2016-09-03: qty 5

## 2016-09-03 MED ORDER — DEXAMETHASONE SODIUM PHOSPHATE 10 MG/ML IJ SOLN
INTRAMUSCULAR | Status: AC
  Filled 2016-09-03: qty 1

## 2016-09-03 MED ORDER — DIPHENHYDRAMINE HCL 50 MG/ML IJ SOLN
INTRAMUSCULAR | Status: AC
  Filled 2016-09-03: qty 1

## 2016-09-03 MED ORDER — ACETAMINOPHEN 325 MG PO TABS
650.0000 mg | ORAL_TABLET | Freq: Once | ORAL | Status: AC
  Administered 2016-09-03: 650 mg via ORAL

## 2016-09-03 NOTE — Telephone Encounter (Signed)
Scheduled appt per 8/7 los - per Dr. Alvy Bimler okay to move her visit to 9: 30 am due to lab meeting - Gave patient AVS and calender per los.

## 2016-09-03 NOTE — Patient Instructions (Signed)
[facility] Discharge Instructions for Patients Receiving Chemotherapy  Today you received the following chemotherapy agents:  Gazyva  To help prevent nausea and vomiting after your treatment, we encourage you to take your nausea medication as prescribed.   If you develop nausea and vomiting that is not controlled by your nausea medication, call the clinic.   BELOW ARE SYMPTOMS THAT SHOULD BE REPORTED IMMEDIATELY:  *FEVER GREATER THAN 100.5 F  *CHILLS WITH OR WITHOUT FEVER  NAUSEA AND VOMITING THAT IS NOT CONTROLLED WITH YOUR NAUSEA MEDICATION  *UNUSUAL SHORTNESS OF BREATH  *UNUSUAL BRUISING OR BLEEDING  TENDERNESS IN MOUTH AND THROAT WITH OR WITHOUT PRESENCE OF ULCERS  *URINARY PROBLEMS  *BOWEL PROBLEMS  UNUSUAL RASH Items with * indicate a potential emergency and should be followed up as soon as possible.  Feel free to call the clinic you have any questions or concerns. The clinic phone number is [phone].  Please show the CHEMO ALERT CARD at check-in to the Emergency Department and triage nurse.   

## 2016-09-03 NOTE — Patient Instructions (Signed)

## 2016-09-05 ENCOUNTER — Encounter: Payer: Self-pay | Admitting: Hematology and Oncology

## 2016-09-05 NOTE — Assessment & Plan Note (Signed)
She has poorly controlled diabetes.  Plan to add regular insulin during treatment if needed

## 2016-09-05 NOTE — Progress Notes (Signed)
[facility] PROGRESS NOTE  Patient Care Team: [name], Bill Salinas, MD as PCP - General (Family Medicine) Sharyne Peach, MD as Consulting Physician (Ophthalmology) Inda Castle, MD as Consulting Physician (Gastroenterology) Sypher, Herbie Baltimore, MD (Inactive) as Consulting Physician (Orthopedic Surgery) Jannette Spanner, MD as Referring Physician (Dermatology) Heath Lark, MD as Consulting Physician (Hematology and Oncology) Alphonsa Overall, MD as Consulting Physician (General Surgery)  SUMMARY OF ONCOLOGIC HISTORY: Oncology History   Marginal zone lymphoma, with lymph node and bone marrow involvement along with splenomegaly   Primary site: Lymphoid Neoplasms   Staging method: AJCC 6th Edition   Clinical: Stage IV signed by Heath Lark, MD on 09/02/2013  7:38 PM   Summary: Stage IV       Marginal zone lymphoma ([place])   07/29/2013 Imaging    CT scan shows splenomegaly and abnormal lymphadenopathy.      08/13/2013 Imaging    PET CT scan confirmed lymphadenopathy and splenomegaly, those areas are PET avid      08/23/2013 Surgery    [id number] left axillary lymph node biopsy confirmed a low-grade lymphoma.      08/27/2013 Bone Marrow Biopsy    Bone marrow biopsy confirmed lymphoma.[id number]      09/08/2013 - 09/29/2013 Chemotherapy    She completed 4 cycles of rituximab with resolution of splenomegaly.      12/29/2013 Imaging    PET scan show complete response.      07/11/2014 Imaging    PET scan showed recurrent disease      07/19/2014 - 08/09/2014 Chemotherapy    She completed 4 cycles of rituximab with resolution of splenomegaly      10/12/2014 Imaging    Repeat PET/CT scan showed near complete response to treatment.      10/13/2014 - 02/09/2015 Chemotherapy    She is placed on rituximab maintenance therapy.      03/29/2015 Imaging    PET CT showed possible mild progression of splenomegaly      04/26/2015 - 02/22/2016 Chemotherapy    She started taking  Ibrutinib      04/28/2015 Adverse Reaction    Treatment was placed temporarily on hold due to severe diarrhea and resumed at reduced dose      07/03/2015 PET scan    No hypermetabolic adenopathy in the neck, chest, abdomen or pelvis.2. Spleen is not hypermetabolic and appears slightly less prominent size.      02/06/2016 PET scan    Hypermetabolic LEFT axillary lymph nodes. Concern for HIGH-GRADE LYMPHOMA LOCALIZED RECURRENCE. 2. Prominent spleen without hypermetabolic activity. No change in size from comparison. 3. No additional hypermetabolic adenopathy on scan.      02/19/2016 Procedure    Technically successful ultrasound-guided core biopsy, left axillary adenopathy.      02/19/2016 Pathology Results    Lymph node, needle/core biopsy, left axilla - ATYPICAL LYMPHOID PROLIFERATION SUSPICIOUS FOR LYMPHOMA. - SEE COMMENT. Microscopic Comment The sections show multiple very small and skinny fragments of lymph nodal tissue displaying areas of lymphoid expansion by a population of small to medium size lymphocytes with round to irregular nuclei, scanty to moderately abundant cytoplasm, dense chromatin and inconspicuous nucleoli. This is associated with scattering of "naked" germinal centers with abundant tingible body macrophages. To further evaluate this process, flow cytometric analysis was performed and shows a minor population of monoclonal B cells expressing pan B-cell antigens including CD20 with associated kappa light chain restriction. In addition, immunohistochemical stains were performed including CD20, CD79a, CD3, CD5, CD10, BCL-2, CD21  with appropriate controls. The stains show a mixture of T and B cells with slight predominance of B cells. CD21 highlights numerous areas with dendritic networks, correlating with the presence of germinal centers. CD10 shows focal positivity likely in germinal centers. BCL-2 is diffusely positive except in areas of apparent germinal centers. The overall  features are atypical and suspicious for involvement by a B-cell lymphoproliferative process, particularly given the B-cell monoclonality by flow cytometry. However, morphologic evaluation is extremely limited due to small biopsy fragments and additional material (excisional biopsy) is strongly recommended for further evaluation. (BNS:ecj 02/21/2016)       03/04/2016 Procedure    Placement of single lumen port a cath via right internal jugular vein. The catheter tip lies at the cavo-atrial junction. A power injectable port a cath was placed and is ready for immediate use.      03/07/2016 - 05/31/2016 Chemotherapy    She received treatment with bendamustine and Gazyva      05/27/2016 PET scan    1. No evidence of active lymphoma on whole-body scan. 2. Resolution of metabolic activity previously identified in the LEFT axilla. 3. Small amount free fluid the pelvis.       INTERVAL HISTORY: Please see below for problem oriented charting. She returns for further follow-up Her energy level is fair She denies recent infection No new lymphadenopathy  REVIEW OF SYSTEMS:   Constitutional: Denies fevers, chills or abnormal weight loss Eyes: Denies blurriness of vision Ears, nose, mouth, throat, and face: Denies mucositis or sore throat Respiratory: Denies cough, dyspnea or wheezes Cardiovascular: Denies palpitation, chest discomfort or lower extremity swelling Gastrointestinal:  Denies nausea, heartburn or change in bowel habits Skin: Denies abnormal skin rashes Lymphatics: Denies new lymphadenopathy or easy bruising Neurological:Denies numbness, tingling or new weaknesses Behavioral/Psych: Mood is stable, no new changes  All other systems were reviewed with the patient and are negative.  I have reviewed the past medical history, past surgical history, social history and family history with the patient and they are unchanged from previous note.  ALLERGIES:  is allergic to aspirin; capoten  [captopril]; levemir [insulin detemir]; micronase [glyburide]; onglyza [saxagliptin]; statins; and zocor [simvastatin].  MEDICATIONS:  Current Outpatient Prescriptions  Medication Sig Dispense Refill  . acyclovir (ZOVIRAX) 400 MG tablet TAKE ONE TABLET BY MOUTH ONCE DAILY 30 tablet 5  . amLODipine (NORVASC) 10 MG tablet Take 1 tablet (10 mg total) by mouth at bedtime. 90 tablet 3  . aspirin 81 MG tablet Take 40.5 mg by mouth 3 (three) times a week.    . Calcium Carbonate-Vit D-Min (CALCIUM 1200 PO) Take 1 tablet by mouth daily. Per patient takes in am and pm    . cholecalciferol (VITAMIN D) 1000 UNITS tablet Take 1,000 Units by mouth daily.     . enalapril (VASOTEC) 20 MG tablet Take 20 mg by mouth every morning.    . fish oil-omega-3 fatty acids 1000 MG capsule Take 1 g by mouth daily.     Marland Kitchen glimepiride (AMARYL) 4 MG tablet Take 4 mg by mouth daily with breakfast. Per patient takes in am and pm.    . glucose blood test strip Use as instructed 100 each 12  . lidocaine-prilocaine (EMLA) cream Apply 1 application topically as needed. 30 g 3  . loperamide (IMODIUM) 2 MG capsule Take by mouth as needed for diarrhea or loose stools.    Marland Kitchen LORazepam (ATIVAN) 0.5 MG tablet Take 1 tablet (0.5 mg total) by mouth every 8 (eight) hours  as needed for anxiety. 30 tablet 0  . Magnesium 500 MG CAPS Take 500 mg by mouth daily.     . metFORMIN (GLUCOPHAGE) 1000 MG tablet Take 1,000 mg by mouth 2 (two) times daily with a meal.     . Misc Natural Products (LUTEIN 20 PO) Take by mouth daily.    . Multiple Vitamins-Minerals (CENTRUM CARDIO) TABS Take 1 tablet by mouth daily.    . Multiple Vitamins-Minerals (ICAPS) CAPS Take by mouth daily.     No current facility-administered medications for this visit.     PHYSICAL EXAMINATION: ECOG PERFORMANCE STATUS: 0 - Asymptomatic  Vitals:   09/03/16 0831  BP: (!) 160/57  Pulse: 88  Resp: 18  Temp: 98.6 F (37 C)  SpO2: 100%   Filed Weights   09/03/16 0831   Weight: 149 lb 14.4 oz (68 kg)    GENERAL:alert, no distress and comfortable SKIN: skin color, texture, turgor are normal, no rashes or significant lesions EYES: normal, Conjunctiva are pink and non-injected, sclera clear OROPHARYNX:no exudate, no erythema and lips, buccal mucosa, and tongue normal  NECK: supple, thyroid normal size, non-tender, without nodularity LYMPH:  no palpable lymphadenopathy in the cervical, axillary or inguinal LUNGS: clear to auscultation and percussion with normal breathing effort HEART: regular rate & rhythm and no murmurs and no lower extremity edema ABDOMEN:abdomen soft, non-tender and normal bowel sounds Musculoskeletal:no cyanosis of digits and no clubbing  NEURO: alert & oriented x 3 with fluent speech, no focal motor/sensory deficits  LABORATORY DATA:  I have reviewed the data as listed    Component Value Date/Time   NA 140 09/03/2016 0750   K 4.2 09/03/2016 0750   CL 106 02/19/2016 1120   CO2 25 09/03/2016 0750   GLUCOSE 268 (H) 09/03/2016 0750   BUN 17.1 09/03/2016 0750   CREATININE 1.2 (H) 09/03/2016 0750   CALCIUM 9.2 09/03/2016 0750   PROT 6.0 (L) 09/03/2016 0750   ALBUMIN 3.5 09/03/2016 0750   AST 18 09/03/2016 0750   ALT 18 09/03/2016 0750   ALKPHOS 65 09/03/2016 0750   BILITOT 0.41 09/03/2016 0750   GFRNONAA >60 02/19/2016 1120   GFRNONAA 47 (L) 05/18/2013 1256   GFRAA >60 02/19/2016 1120   GFRAA 55 (L) 05/18/2013 1256    No results found for: SPEP, UPEP  Lab Results  Component Value Date   WBC 4.7 09/03/2016   NEUTROABS 3.9 09/03/2016   HGB 10.1 (L) 09/03/2016   HCT 29.6 (L) 09/03/2016   MCV 87.4 09/03/2016   PLT 217 09/03/2016      Chemistry      Component Value Date/Time   NA 140 09/03/2016 0750   K 4.2 09/03/2016 0750   CL 106 02/19/2016 1120   CO2 25 09/03/2016 0750   BUN 17.1 09/03/2016 0750   CREATININE 1.2 (H) 09/03/2016 0750   GLU 202 (H) 08/09/2014 1000      Component Value Date/Time   CALCIUM 9.2  09/03/2016 0750   ALKPHOS 65 09/03/2016 0750   AST 18 09/03/2016 0750   ALT 18 09/03/2016 0750   BILITOT 0.41 09/03/2016 0750       ASSESSMENT & PLAN:  Marginal zone lymphoma She has excellent response to treatment with minimum side effects, except for anemia The risks, benefits, side effects and maintenance treatment is discussed with the patient and she agreed to proceed Clinically, she has no signs of disease. I plan to repeat imaging study again at the end of the year  Type  2 diabetes mellitus with diabetic nephropathy She has poorly controlled diabetes.  Plan to add regular insulin during treatment if needed  Essential hypertension she will continue current medical management. I recommend close follow-up with primary care doctor for medication adjustment.    No orders of the defined types were placed in this encounter.  All questions were answered. The patient knows to call the clinic with any problems, questions or concerns. No barriers to learning was detected. I spent 15 minutes counseling the patient face to face. The total time spent in the appointment was 20 minutes and more than 50% was on counseling and review of test results     Heath Lark, MD 09/05/2016 2:00 PM

## 2016-09-05 NOTE — Assessment & Plan Note (Signed)
she will continue current medical management. I recommend close follow-up with primary care doctor for medication adjustment.  

## 2016-09-05 NOTE — Assessment & Plan Note (Signed)
She has excellent response to treatment with minimum side effects, except for anemia The risks, benefits, side effects and maintenance treatment is discussed with the patient and she agreed to proceed Clinically, she has no signs of disease. I plan to repeat imaging study again at the end of the year

## 2016-10-03 IMAGING — CT NM PET TUM IMG RESTAG (PS) SKULL BASE T - THIGH
8 series · 25 of 25 positions shown · non-contrast
Comparison: 12/29/2013

CLINICAL DATA: Subsequent treatment strategy for history of
marginal zone lymphoma. Splenomegaly and recurrent anemia. Evaluate
for recurrence. Diabetes. Hyperlipidemia. High blood pressure..

EXAM:
NUCLEAR MEDICINE PET SKULL BASE TO THIGH
TECHNIQUE: 7.4 MCi F-18 FDG was injected intravenously. Full-ring PET imaging
was performed from the skull base to thigh after the radiotracer. CT
data was obtained and used for attenuation correction and anatomic
localization.
FASTING BLOOD GLUCOSE:  Value: 110 mg/dl

[Series 3: pet sk_thigh ac · axial · 5.0mm · 4.07mm/px · z∈[-760,+100]mm · 4 of 216 slices shown]
[im 1/216]
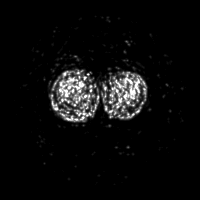
[im 72/216]
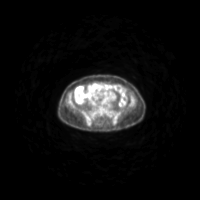
[im 144/216]
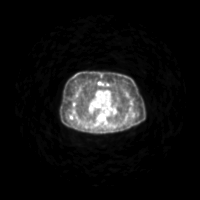
[im 216/216]
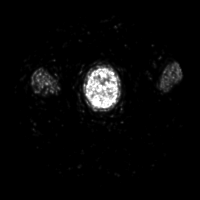

[Series 4: ct sk_thigh 5.0 b31f · axial · 5.0mm · 0.98mm/px · z∈[-760,+100]mm · 5 of 214 slices shown]
[im 1/214]
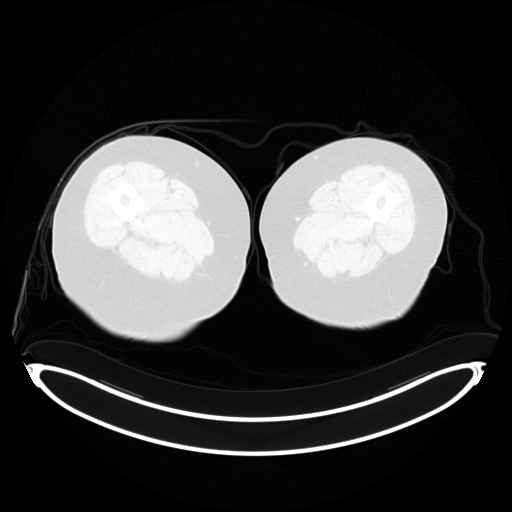
[im 54/214]
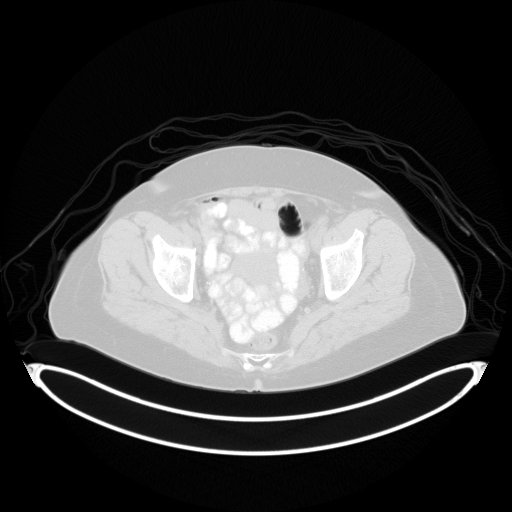
[im 107/214]
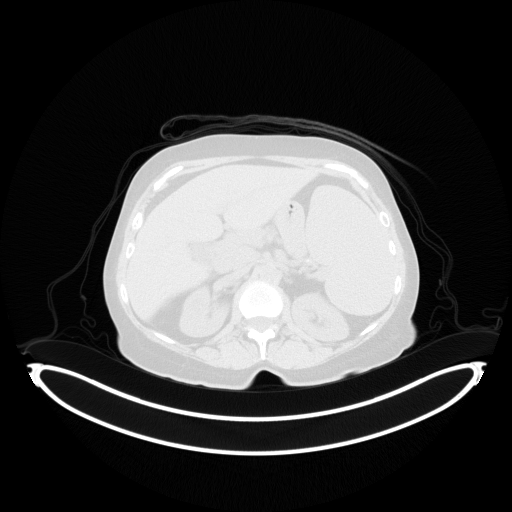
[im 160/214]
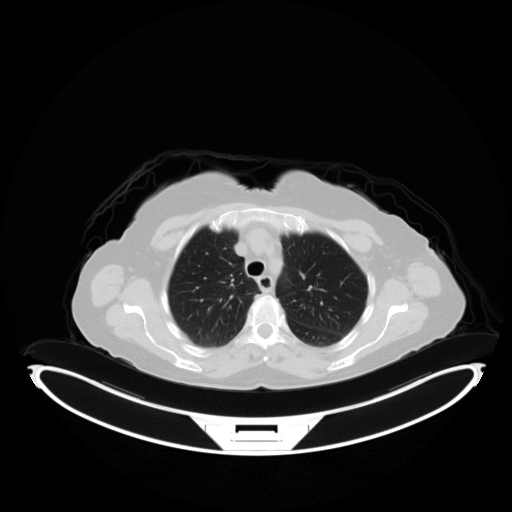
[im 214/214  brain]
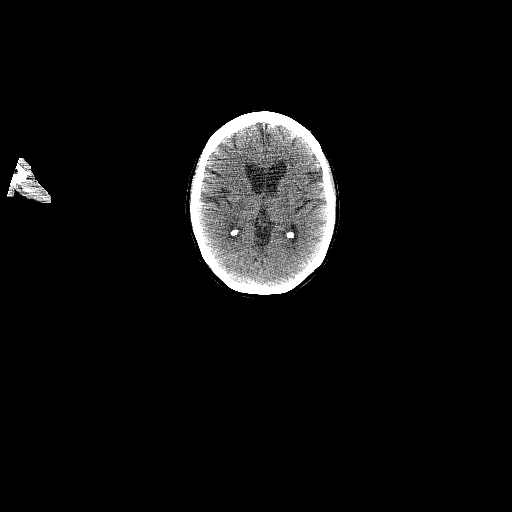

[Series 6: ct sk_thigh 5.0 b70f (id)_bone · axial · 5.0mm · 0.59mm/px · z∈[-278,-34]mm · 2 of 62 slices shown]
[im 1/62  bone]
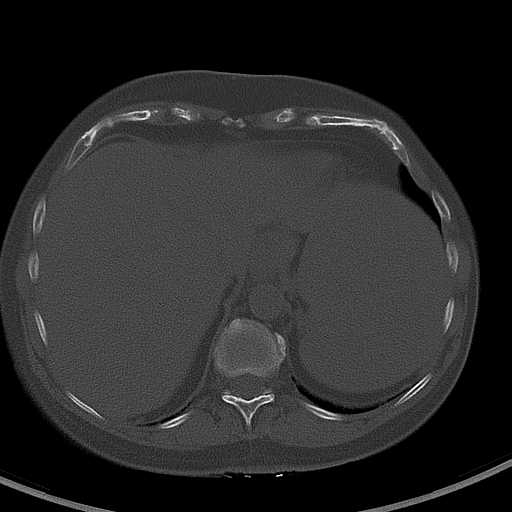
[im 62/62  bone]
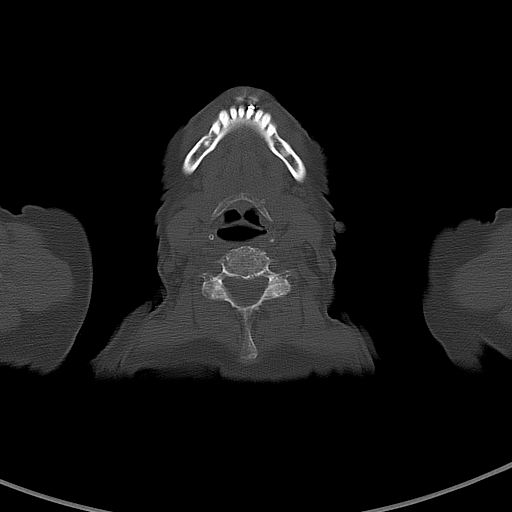

[Series 8: pet sk_thigh nac · axial · 5.0mm · 4.07mm/px · z∈[-760,+100]mm · 5 of 216 slices shown]
[im 1/216]
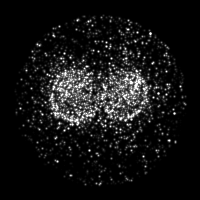
[im 54/216]
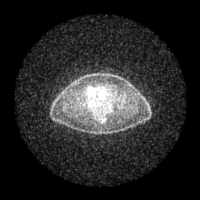
[im 108/216]
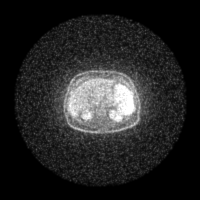
[im 162/216]
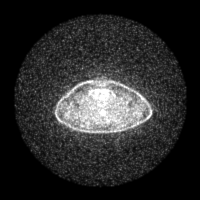
[im 216/216]
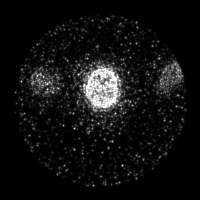

[Series 604: mip collection<mip range> · coronal · 1.79mm/px · 1 of 32 slices shown]
[im 1/32]
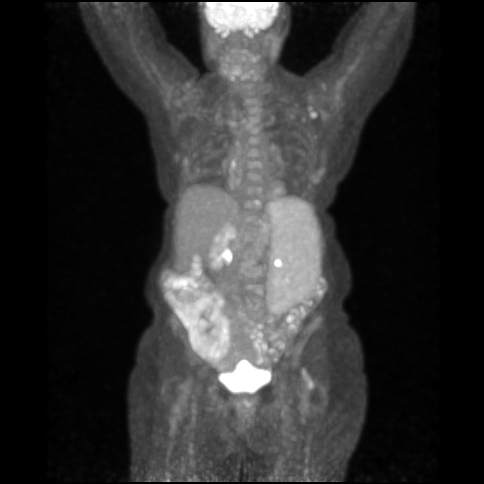

[Series 605: range-ct sk_thigh 5.0 (id)<alpha range> · 2 of 73 slices shown (1 of 2)]
[im 1/73]
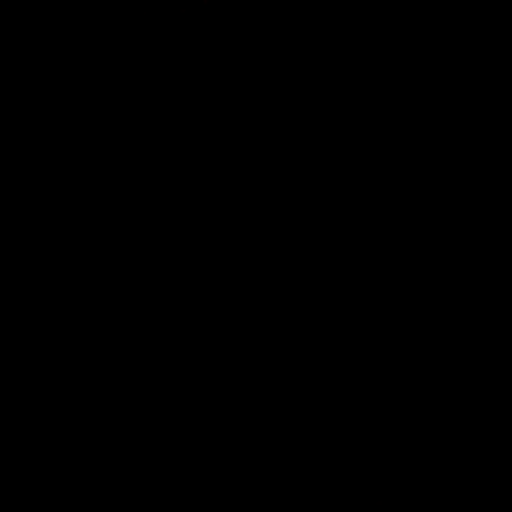
[im 73/73]
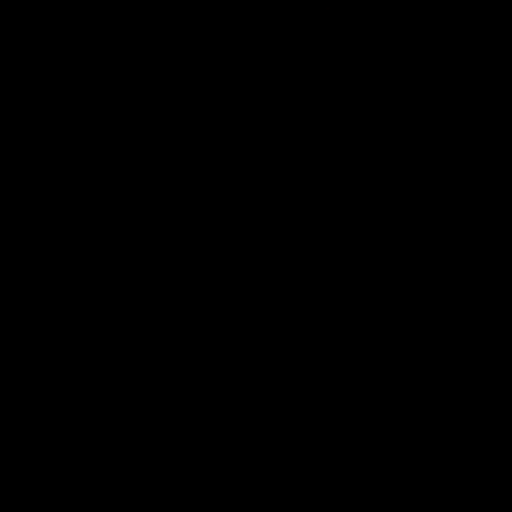

[Series 606: range-ct sk_thigh 5.0 (id)<alpha range> · 5 of 204 slices shown (2 of 2)]
[im 1/204]
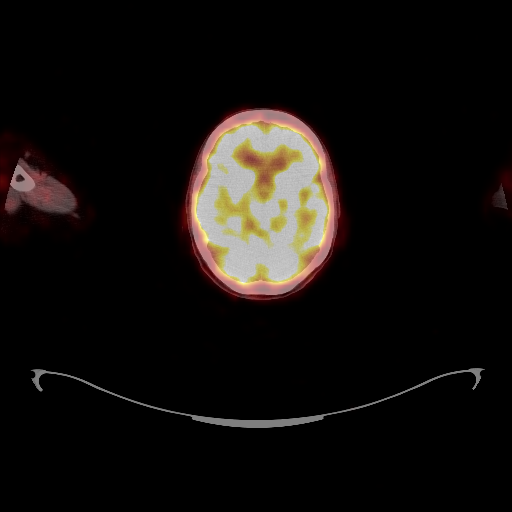
[im 51/204]
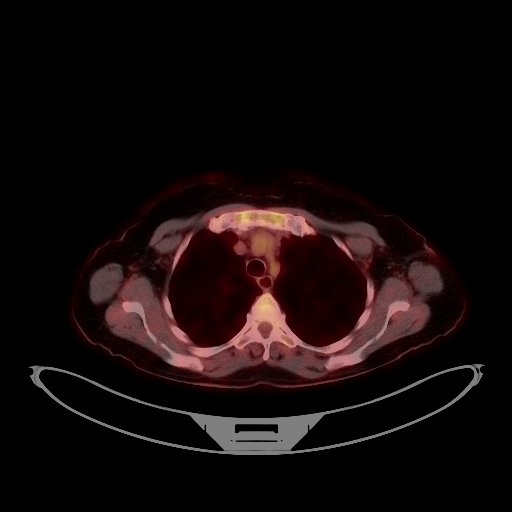
[im 102/204]
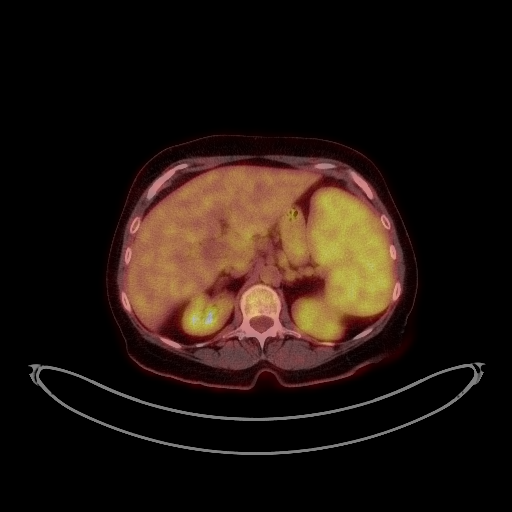
[im 153/204]
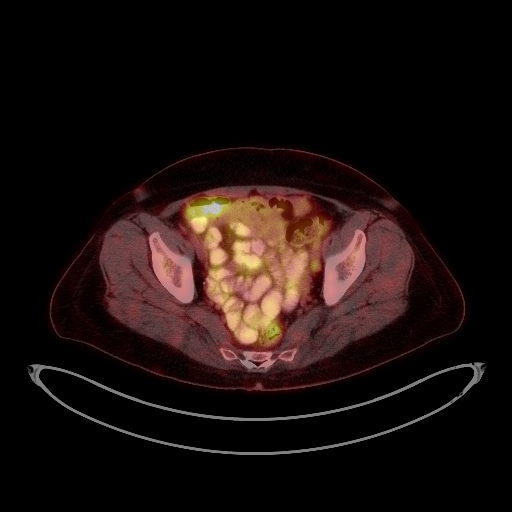
[im 204/204]
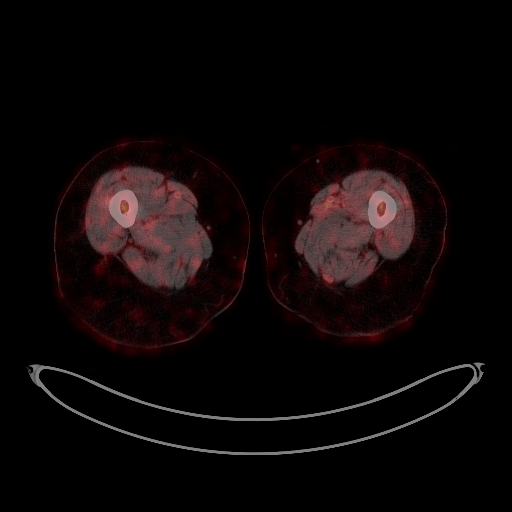

[Series 1189: results mm oncology reading · 1.06mm/px · 1 of 6 slices shown]
[im 1/6]
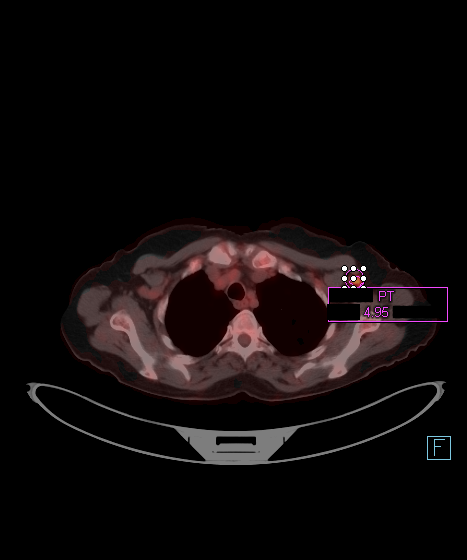

[25 of 25 positions shown; findings below may reference images not displayed]

FINDINGS: NECK

No areas of abnormal hypermetabolism.

CHEST

Redevelopment of hypermetabolic left axillary nodes. An index node
measures 9 mm and a S.U.V. max of 5.0 on image 52. Enlarged from 6
mm on the prior.

ABDOMEN/PELVIS

Development of splenomegaly and splenic hypermetabolism. The spleen
measures approximately 21 cm craniocaudal and a S.U.V. max of 4.3.
This compares to the liver, which measures a S.U.V. max of 2.7.

Aortocaval node measures 7 mm and a S.U.V. max of 2.9 on image 120.
This is enlarged from 6 mm on the prior.

porta hepatis node which measures 12 mm and a S.U.V. max of 3.2 on
image 113.

Hypermetabolic left external iliac node which measures 1.0 cm and a
S.U.V. max of 4.6. 6 mm on the prior.

SKELETON

No abnormal marrow activity.

CT IMAGES PERFORMED FOR ATTENUATION CORRECTION

Fluid in the left worse than right maxillary sinuses. No cervical
adenopathy. Normal heart size with multivessel coronary artery
atherosclerosis. Old granulomatous disease in the liver and spleen.
IMPRESSION: 1. Recurrent lymphoma, including within nodal stations of the chest,
abdomen, and pelvis. Splenomegaly and splenic hypermetabolism, also
highly suspicious for recurrent disease.
2. Sinus disease.
3.  Atherosclerosis, including within the coronary arteries.

## 2016-11-05 ENCOUNTER — Encounter: Payer: Self-pay | Admitting: Hematology and Oncology

## 2016-11-05 ENCOUNTER — Ambulatory Visit (HOSPITAL_BASED_OUTPATIENT_CLINIC_OR_DEPARTMENT_OTHER): Payer: Medicare Other

## 2016-11-05 ENCOUNTER — Other Ambulatory Visit: Payer: Self-pay | Admitting: *Deleted

## 2016-11-05 ENCOUNTER — Other Ambulatory Visit (HOSPITAL_BASED_OUTPATIENT_CLINIC_OR_DEPARTMENT_OTHER): Payer: Medicare Other

## 2016-11-05 ENCOUNTER — Ambulatory Visit: Payer: Medicare Other

## 2016-11-05 ENCOUNTER — Ambulatory Visit (HOSPITAL_BASED_OUTPATIENT_CLINIC_OR_DEPARTMENT_OTHER): Payer: Medicare Other | Admitting: Hematology and Oncology

## 2016-11-05 VITALS — BP 152/55 | HR 96 | Temp 98.0°F | Resp 18

## 2016-11-05 DIAGNOSIS — I10 Essential (primary) hypertension: Secondary | ICD-10-CM

## 2016-11-05 DIAGNOSIS — C8588 Other specified types of non-Hodgkin lymphoma, lymph nodes of multiple sites: Secondary | ICD-10-CM

## 2016-11-05 DIAGNOSIS — Z5112 Encounter for antineoplastic immunotherapy: Secondary | ICD-10-CM | POA: Diagnosis not present

## 2016-11-05 DIAGNOSIS — C8580 Other specified types of non-Hodgkin lymphoma, unspecified site: Secondary | ICD-10-CM

## 2016-11-05 DIAGNOSIS — Z23 Encounter for immunization: Secondary | ICD-10-CM

## 2016-11-05 DIAGNOSIS — D630 Anemia in neoplastic disease: Secondary | ICD-10-CM | POA: Diagnosis not present

## 2016-11-05 LAB — CBC WITH DIFFERENTIAL/PLATELET
BASO%: 1 % (ref 0.0–2.0)
BASOS ABS: 0.1 10*3/uL (ref 0.0–0.1)
EOS%: 0.6 % (ref 0.0–7.0)
Eosinophils Absolute: 0 10*3/uL (ref 0.0–0.5)
HCT: 31.6 % — ABNORMAL LOW (ref 34.8–46.6)
HEMOGLOBIN: 10.9 g/dL — AB (ref 11.6–15.9)
LYMPH%: 7.5 % — ABNORMAL LOW (ref 14.0–49.7)
MCH: 30.7 pg (ref 25.1–34.0)
MCHC: 34.5 g/dL (ref 31.5–36.0)
MCV: 89.1 fL (ref 79.5–101.0)
MONO#: 0.4 10*3/uL (ref 0.1–0.9)
MONO%: 6 % (ref 0.0–14.0)
NEUT%: 84.9 % — ABNORMAL HIGH (ref 38.4–76.8)
NEUTROS ABS: 5.4 10*3/uL (ref 1.5–6.5)
PLATELETS: 251 10*3/uL (ref 145–400)
RBC: 3.54 10*6/uL — ABNORMAL LOW (ref 3.70–5.45)
RDW: 14.4 % (ref 11.2–14.5)
WBC: 6.3 10*3/uL (ref 3.9–10.3)
lymph#: 0.5 10*3/uL — ABNORMAL LOW (ref 0.9–3.3)

## 2016-11-05 LAB — COMPREHENSIVE METABOLIC PANEL
ALBUMIN: 3.9 g/dL (ref 3.5–5.0)
ALK PHOS: 68 U/L (ref 40–150)
ALT: 14 U/L (ref 0–55)
ANION GAP: 10 meq/L (ref 3–11)
AST: 20 U/L (ref 5–34)
BILIRUBIN TOTAL: 0.45 mg/dL (ref 0.20–1.20)
BUN: 18.9 mg/dL (ref 7.0–26.0)
CALCIUM: 10.4 mg/dL (ref 8.4–10.4)
CO2: 25 mEq/L (ref 22–29)
Chloride: 103 mEq/L (ref 98–109)
Creatinine: 1.1 mg/dL (ref 0.6–1.1)
EGFR: 49 mL/min/{1.73_m2} — AB (ref >90)
GLUCOSE: 97 mg/dL (ref 70–140)
POTASSIUM: 4.2 meq/L (ref 3.5–5.1)
SODIUM: 139 meq/L (ref 136–145)
TOTAL PROTEIN: 6.8 g/dL (ref 6.4–8.3)

## 2016-11-05 MED ORDER — SODIUM CHLORIDE 0.9% FLUSH
10.0000 mL | INTRAVENOUS | Status: DC | PRN
  Administered 2016-11-05: 10 mL
  Filled 2016-11-05: qty 10

## 2016-11-05 MED ORDER — DEXAMETHASONE SODIUM PHOSPHATE 10 MG/ML IJ SOLN
INTRAMUSCULAR | Status: AC
  Filled 2016-11-05: qty 1

## 2016-11-05 MED ORDER — DIPHENHYDRAMINE HCL 50 MG/ML IJ SOLN
50.0000 mg | Freq: Once | INTRAMUSCULAR | Status: AC
  Administered 2016-11-05: 50 mg via INTRAVENOUS

## 2016-11-05 MED ORDER — SODIUM CHLORIDE 0.9 % IV SOLN
1000.0000 mg | Freq: Once | INTRAVENOUS | Status: AC
  Administered 2016-11-05: 1000 mg via INTRAVENOUS
  Filled 2016-11-05: qty 40

## 2016-11-05 MED ORDER — HEPARIN SOD (PORK) LOCK FLUSH 100 UNIT/ML IV SOLN
500.0000 [IU] | Freq: Once | INTRAVENOUS | Status: AC | PRN
  Administered 2016-11-05: 500 [IU]
  Filled 2016-11-05: qty 5

## 2016-11-05 MED ORDER — DIPHENHYDRAMINE HCL 50 MG/ML IJ SOLN
INTRAMUSCULAR | Status: AC
  Filled 2016-11-05: qty 1

## 2016-11-05 MED ORDER — ACETAMINOPHEN 325 MG PO TABS
ORAL_TABLET | ORAL | Status: AC
  Filled 2016-11-05: qty 2

## 2016-11-05 MED ORDER — ACETAMINOPHEN 325 MG PO TABS
650.0000 mg | ORAL_TABLET | Freq: Once | ORAL | Status: AC
  Administered 2016-11-05: 650 mg via ORAL

## 2016-11-05 MED ORDER — DEXAMETHASONE SODIUM PHOSPHATE 10 MG/ML IJ SOLN
10.0000 mg | Freq: Once | INTRAMUSCULAR | Status: AC
  Administered 2016-11-05: 10 mg via INTRAVENOUS

## 2016-11-05 MED ORDER — INFLUENZA VAC SPLIT QUAD 0.5 ML IM SUSY
0.5000 mL | PREFILLED_SYRINGE | Freq: Once | INTRAMUSCULAR | Status: AC
  Administered 2016-11-05: 0.5 mL via INTRAMUSCULAR
  Filled 2016-11-05: qty 0.5

## 2016-11-05 NOTE — Patient Instructions (Signed)
[facility] Discharge Instructions for Patients Receiving Chemotherapy  Today you received the following chemotherapy agents: Gazyva   To help prevent nausea and vomiting after your treatment, we encourage you to take your nausea medication as directed.    If you develop nausea and vomiting that is not controlled by your nausea medication, call the clinic.   BELOW ARE SYMPTOMS THAT SHOULD BE REPORTED IMMEDIATELY:  *FEVER GREATER THAN 100.5 F  *CHILLS WITH OR WITHOUT FEVER  NAUSEA AND VOMITING THAT IS NOT CONTROLLED WITH YOUR NAUSEA MEDICATION  *UNUSUAL SHORTNESS OF BREATH  *UNUSUAL BRUISING OR BLEEDING  TENDERNESS IN MOUTH AND THROAT WITH OR WITHOUT PRESENCE OF ULCERS  *URINARY PROBLEMS  *BOWEL PROBLEMS  UNUSUAL RASH Items with * indicate a potential emergency and should be followed up as soon as possible.  Feel free to call the clinic should you have any questions or concerns. The clinic phone number is [phone].  Please show the CHEMO ALERT CARD at check-in to the Emergency Department and triage nurse.   

## 2016-11-05 NOTE — Assessment & Plan Note (Addendum)
She has excellent response to treatment with minimum side effects, except for anemia The risks, benefits, side effects and maintenance treatment is discussed with the patient and she agreed to proceed Clinically, she has no signs of disease. I plan to repeat imaging study again at the end of the year We discussed the importance of preventive care and reviewed the vaccination programs. She does not have any prior allergic reactions to influenza vaccination. She agrees to proceed with influenza vaccination today and we will administer it today at the clinic.

## 2016-11-05 NOTE — Assessment & Plan Note (Signed)
The patient has intermittent, recurrence of anemia, likely due to anemia of chronic disease due to poorly controlled diabetes and mild chronic kidney disease She is not symptomatic. Observe only for now 

## 2016-11-05 NOTE — Assessment & Plan Note (Signed)
she will continue current medical management. I recommend close follow-up with primary care doctor for medication adjustment.  

## 2016-11-05 NOTE — Progress Notes (Signed)
[facility] OFFICE PROGRESS NOTE  Patient Care Team: [name], Bill Salinas, MD as PCP - General (Family Medicine) Sharyne Peach, MD as Consulting Physician (Ophthalmology) Inda Castle, MD as Consulting Physician (Gastroenterology) Sypher, Herbie Baltimore, MD (Inactive) as Consulting Physician (Orthopedic Surgery) Jannette Spanner, MD as Referring Physician (Dermatology) Heath Lark, MD as Consulting Physician (Hematology and Oncology) Alphonsa Overall, MD as Consulting Physician (General Surgery)  SUMMARY OF ONCOLOGIC HISTORY: Oncology History   Marginal zone lymphoma, with lymph node and bone marrow involvement along with splenomegaly   Primary site: Lymphoid Neoplasms   Staging method: AJCC 6th Edition   Clinical: Stage IV signed by Heath Lark, MD on 09/02/2013  7:38 PM   Summary: Stage IV       Marginal zone lymphoma ([place])   07/29/2013 Imaging    CT scan shows splenomegaly and abnormal lymphadenopathy.      08/13/2013 Imaging    PET CT scan confirmed lymphadenopathy and splenomegaly, those areas are PET avid      08/23/2013 Surgery    [id number] left axillary lymph node biopsy confirmed a low-grade lymphoma.      08/27/2013 Bone Marrow Biopsy    Bone marrow biopsy confirmed lymphoma.[id number]      09/08/2013 - 09/29/2013 Chemotherapy    She completed 4 cycles of rituximab with resolution of splenomegaly.      12/29/2013 Imaging    PET scan show complete response.      07/11/2014 Imaging    PET scan showed recurrent disease      07/19/2014 - 08/09/2014 Chemotherapy    She completed 4 cycles of rituximab with resolution of splenomegaly      10/12/2014 Imaging    Repeat PET/CT scan showed near complete response to treatment.      10/13/2014 - 02/09/2015 Chemotherapy    She is placed on rituximab maintenance therapy.      03/29/2015 Imaging    PET CT showed possible mild progression of splenomegaly      04/26/2015 - 02/22/2016 Chemotherapy    She started taking  Ibrutinib      04/28/2015 Adverse Reaction    Treatment was placed temporarily on hold due to severe diarrhea and resumed at reduced dose      07/03/2015 PET scan    No hypermetabolic adenopathy in the neck, chest, abdomen or pelvis.2. Spleen is not hypermetabolic and appears slightly less prominent size.      02/06/2016 PET scan    Hypermetabolic LEFT axillary lymph nodes. Concern for HIGH-GRADE LYMPHOMA LOCALIZED RECURRENCE. 2. Prominent spleen without hypermetabolic activity. No change in size from comparison. 3. No additional hypermetabolic adenopathy on scan.      02/19/2016 Procedure    Technically successful ultrasound-guided core biopsy, left axillary adenopathy.      02/19/2016 Pathology Results    Lymph node, needle/core biopsy, left axilla - ATYPICAL LYMPHOID PROLIFERATION SUSPICIOUS FOR LYMPHOMA. - SEE COMMENT. Microscopic Comment The sections show multiple very small and skinny fragments of lymph nodal tissue displaying areas of lymphoid expansion by a population of small to medium size lymphocytes with round to irregular nuclei, scanty to moderately abundant cytoplasm, dense chromatin and inconspicuous nucleoli. This is associated with scattering of "naked" germinal centers with abundant tingible body macrophages. To further evaluate this process, flow cytometric analysis was performed and shows a minor population of monoclonal B cells expressing pan B-cell antigens including CD20 with associated kappa light chain restriction. In addition, immunohistochemical stains were performed including CD20, CD79a, CD3, CD5, CD10, BCL-2, CD21  with appropriate controls. The stains show a mixture of T and B cells with slight predominance of B cells. CD21 highlights numerous areas with dendritic networks, correlating with the presence of germinal centers. CD10 shows focal positivity likely in germinal centers. BCL-2 is diffusely positive except in areas of apparent germinal centers. The overall  features are atypical and suspicious for involvement by a B-cell lymphoproliferative process, particularly given the B-cell monoclonality by flow cytometry. However, morphologic evaluation is extremely limited due to small biopsy fragments and additional material (excisional biopsy) is strongly recommended for further evaluation. (BNS:ecj 02/21/2016)       03/04/2016 Procedure    Placement of single lumen port a cath via right internal jugular vein. The catheter tip lies at the cavo-atrial junction. A power injectable port a cath was placed and is ready for immediate use.      03/07/2016 - 05/31/2016 Chemotherapy    She received treatment with bendamustine and Gazyva      05/27/2016 PET scan    1. No evidence of active lymphoma on whole-body scan. 2. Resolution of metabolic activity previously identified in the LEFT axilla. 3. Small amount free fluid the pelvis.       INTERVAL HISTORY: Please see below for problem oriented charting. She returns for treatment She denies new lymphadenopathy No recent infection She remains active with physical activity The patient denies any recent signs or symptoms of bleeding such as spontaneous epistaxis, hematuria or hematochezia.   REVIEW OF SYSTEMS:   Constitutional: Denies fevers, chills or abnormal weight loss Eyes: Denies blurriness of vision Ears, nose, mouth, throat, and face: Denies mucositis or sore throat Respiratory: Denies cough, dyspnea or wheezes Cardiovascular: Denies palpitation, chest discomfort or lower extremity swelling Gastrointestinal:  Denies nausea, heartburn or change in bowel habits Skin: Denies abnormal skin rashes Lymphatics: Denies new lymphadenopathy or easy bruising Neurological:Denies numbness, tingling or new weaknesses Behavioral/Psych: Mood is stable, no new changes  All other systems were reviewed with the patient and are negative.  I have reviewed the past medical history, past surgical history, social history  and family history with the patient and they are unchanged from previous note.  ALLERGIES:  is allergic to aspirin; capoten [captopril]; levemir [insulin detemir]; micronase [glyburide]; onglyza [saxagliptin]; statins; and zocor [simvastatin].  MEDICATIONS:  Current Outpatient Prescriptions  Medication Sig Dispense Refill  . acyclovir (ZOVIRAX) 400 MG tablet TAKE ONE TABLET BY MOUTH ONCE DAILY 30 tablet 5  . amLODipine (NORVASC) 10 MG tablet Take 1 tablet (10 mg total) by mouth at bedtime. 90 tablet 3  . aspirin 81 MG tablet Take 40.5 mg by mouth 3 (three) times a week.    . Calcium Carbonate-Vit D-Min (CALCIUM 1200 PO) Take 1 tablet by mouth daily. Per patient takes in am and pm    . cholecalciferol (VITAMIN D) 1000 UNITS tablet Take 1,000 Units by mouth daily.     . enalapril (VASOTEC) 20 MG tablet Take 20 mg by mouth every morning.    . fish oil-omega-3 fatty acids 1000 MG capsule Take 1 g by mouth daily.     Marland Kitchen glimepiride (AMARYL) 4 MG tablet Take 4 mg by mouth daily with breakfast. Per patient takes in am and pm.    . glucose blood test strip Use as instructed 100 each 12  . lidocaine-prilocaine (EMLA) cream Apply 1 application topically as needed. 30 g 3  . loperamide (IMODIUM) 2 MG capsule Take by mouth as needed for diarrhea or loose stools.    Marland Kitchen  LORazepam (ATIVAN) 0.5 MG tablet Take 1 tablet (0.5 mg total) by mouth every 8 (eight) hours as needed for anxiety. 30 tablet 0  . Magnesium 500 MG CAPS Take 500 mg by mouth daily.     . metFORMIN (GLUCOPHAGE) 1000 MG tablet Take 1,000 mg by mouth 2 (two) times daily with a meal.     . Misc Natural Products (LUTEIN 20 PO) Take by mouth daily.    . Multiple Vitamins-Minerals (CENTRUM CARDIO) TABS Take 1 tablet by mouth daily.    . Multiple Vitamins-Minerals (ICAPS) CAPS Take by mouth daily.     No current facility-administered medications for this visit.    Facility-Administered Medications Ordered in Other Visits  Medication Dose Route  Frequency Provider Last Rate Last Dose  . acetaminophen (TYLENOL) tablet 650 mg  650 mg Oral Once Alvy Bimler, Si Jachim, MD      . dexamethasone (DECADRON) injection 10 mg  10 mg Intravenous Once Alvy Bimler, Jackelynn Hosie, MD      . diphenhydrAMINE (BENADRYL) injection 50 mg  50 mg Intravenous Once Nevin Kozuch, MD      . heparin lock flush 100 unit/mL  500 Units Intracatheter Once PRN Alvy Bimler, Jamaiya Tunnell, MD      . obinutuzumab (GAZYVA) 1,000 mg in sodium chloride 0.9 % 250 mL (3.4483 mg/mL) chemo infusion  1,000 mg Intravenous Once Chon Buhl, MD      . sodium chloride flush (NS) 0.9 % injection 10 mL  10 mL Intracatheter PRN Alvy Bimler, Kermit Arnette, MD        PHYSICAL EXAMINATION: ECOG PERFORMANCE STATUS: 1 - Symptomatic but completely ambulatory  Vitals:   11/05/16 0944  BP: (!) 146/52  Pulse: 88  Resp: 18  Temp: 98.5 F (36.9 C)  SpO2: 100%   Filed Weights   11/05/16 0944  Weight: 150 lb 6.4 oz (68.2 kg)    GENERAL:alert, no distress and comfortable SKIN: skin color, texture, turgor are normal, no rashes or significant lesions EYES: normal, Conjunctiva are pink and non-injected, sclera clear OROPHARYNX:no exudate, no erythema and lips, buccal mucosa, and tongue normal  NECK: supple, thyroid normal size, non-tender, without nodularity LYMPH:  no palpable lymphadenopathy in the cervical, axillary or inguinal LUNGS: clear to auscultation and percussion with normal breathing effort HEART: regular rate & rhythm and no murmurs and no lower extremity edema ABDOMEN:abdomen soft, non-tender and normal bowel sounds Musculoskeletal:no cyanosis of digits and no clubbing  NEURO: alert & oriented x 3 with fluent speech, no focal motor/sensory deficits  LABORATORY DATA:  I have reviewed the data as listed    Component Value Date/Time   NA 139 11/05/2016 0845   K 4.2 11/05/2016 0845   CL 106 02/19/2016 1120   CO2 25 11/05/2016 0845   GLUCOSE 97 11/05/2016 0845   BUN 18.9 11/05/2016 0845   CREATININE 1.1 11/05/2016 0845    CALCIUM 10.4 11/05/2016 0845   PROT 6.8 11/05/2016 0845   ALBUMIN 3.9 11/05/2016 0845   AST 20 11/05/2016 0845   ALT 14 11/05/2016 0845   ALKPHOS 68 11/05/2016 0845   BILITOT 0.45 11/05/2016 0845   GFRNONAA >60 02/19/2016 1120   GFRNONAA 47 (L) 05/18/2013 1256   GFRAA >60 02/19/2016 1120   GFRAA 55 (L) 05/18/2013 1256    No results found for: SPEP, UPEP  Lab Results  Component Value Date   WBC 6.3 11/05/2016   NEUTROABS 5.4 11/05/2016   HGB 10.9 (L) 11/05/2016   HCT 31.6 (L) 11/05/2016   MCV 89.1 11/05/2016   PLT  251 11/05/2016      Chemistry      Component Value Date/Time   NA 139 11/05/2016 0845   K 4.2 11/05/2016 0845   CL 106 02/19/2016 1120   CO2 25 11/05/2016 0845   BUN 18.9 11/05/2016 0845   CREATININE 1.1 11/05/2016 0845   GLU 202 (H) 08/09/2014 1000      Component Value Date/Time   CALCIUM 10.4 11/05/2016 0845   ALKPHOS 68 11/05/2016 0845   AST 20 11/05/2016 0845   ALT 14 11/05/2016 0845   BILITOT 0.45 11/05/2016 0845      ASSESSMENT & PLAN:  Marginal zone lymphoma She has excellent response to treatment with minimum side effects, except for anemia The risks, benefits, side effects and maintenance treatment is discussed with the patient and she agreed to proceed Clinically, she has no signs of disease. I plan to repeat imaging study again at the end of the year We discussed the importance of preventive care and reviewed the vaccination programs. She does not have any prior allergic reactions to influenza vaccination. She agrees to proceed with influenza vaccination today and we will administer it today at the clinic.   Anemia in neoplastic disease The patient has intermittent, recurrence of anemia, likely due to anemia of chronic disease due to poorly controlled diabetes and mild chronic kidney disease She is not symptomatic. Observe only for now  Essential hypertension she will continue current medical management. I recommend close follow-up with  primary care doctor for medication adjustment.    No orders of the defined types were placed in this encounter.  All questions were answered. The patient knows to call the clinic with any problems, questions or concerns. No barriers to learning was detected. I spent 15 minutes counseling the patient face to face. The total time spent in the appointment was 20 minutes and more than 50% was on counseling and review of test results     Heath Lark, MD 11/05/2016 10:38 AM

## 2016-11-07 ENCOUNTER — Ambulatory Visit: Payer: Medicare Other | Admitting: Hematology and Oncology

## 2016-11-07 ENCOUNTER — Other Ambulatory Visit: Payer: Medicare Other

## 2016-11-07 ENCOUNTER — Ambulatory Visit: Payer: Medicare Other

## 2017-01-07 ENCOUNTER — Ambulatory Visit (HOSPITAL_BASED_OUTPATIENT_CLINIC_OR_DEPARTMENT_OTHER): Payer: Medicare Other

## 2017-01-07 ENCOUNTER — Encounter: Payer: Self-pay | Admitting: Hematology and Oncology

## 2017-01-07 ENCOUNTER — Ambulatory Visit (HOSPITAL_BASED_OUTPATIENT_CLINIC_OR_DEPARTMENT_OTHER): Payer: Medicare Other | Admitting: Hematology and Oncology

## 2017-01-07 ENCOUNTER — Telehealth: Payer: Self-pay | Admitting: Hematology and Oncology

## 2017-01-07 ENCOUNTER — Ambulatory Visit: Payer: Medicare Other

## 2017-01-07 ENCOUNTER — Other Ambulatory Visit (HOSPITAL_BASED_OUTPATIENT_CLINIC_OR_DEPARTMENT_OTHER): Payer: Medicare Other

## 2017-01-07 VITALS — BP 152/55 | HR 91 | Temp 98.3°F | Resp 17

## 2017-01-07 VITALS — BP 147/71 | HR 92 | Temp 98.9°F | Resp 20 | Ht 64.0 in | Wt 157.4 lb

## 2017-01-07 DIAGNOSIS — N183 Chronic kidney disease, stage 3 unspecified: Secondary | ICD-10-CM

## 2017-01-07 DIAGNOSIS — C8588 Other specified types of non-Hodgkin lymphoma, lymph nodes of multiple sites: Secondary | ICD-10-CM

## 2017-01-07 DIAGNOSIS — Z5112 Encounter for antineoplastic immunotherapy: Secondary | ICD-10-CM

## 2017-01-07 DIAGNOSIS — J069 Acute upper respiratory infection, unspecified: Secondary | ICD-10-CM | POA: Diagnosis not present

## 2017-01-07 DIAGNOSIS — D630 Anemia in neoplastic disease: Secondary | ICD-10-CM

## 2017-01-07 DIAGNOSIS — C8580 Other specified types of non-Hodgkin lymphoma, unspecified site: Secondary | ICD-10-CM

## 2017-01-07 DIAGNOSIS — Z95828 Presence of other vascular implants and grafts: Secondary | ICD-10-CM

## 2017-01-07 LAB — CBC WITH DIFFERENTIAL/PLATELET
BASO%: 0.3 % (ref 0.0–2.0)
Basophils Absolute: 0 10*3/uL (ref 0.0–0.1)
EOS%: 0.4 % (ref 0.0–7.0)
Eosinophils Absolute: 0 10*3/uL (ref 0.0–0.5)
HEMATOCRIT: 31.4 % — AB (ref 34.8–46.6)
HEMOGLOBIN: 10.6 g/dL — AB (ref 11.6–15.9)
LYMPH#: 0.8 10*3/uL — AB (ref 0.9–3.3)
LYMPH%: 10.5 % — ABNORMAL LOW (ref 14.0–49.7)
MCH: 30.2 pg (ref 25.1–34.0)
MCHC: 33.8 g/dL (ref 31.5–36.0)
MCV: 89.5 fL (ref 79.5–101.0)
MONO#: 0.5 10*3/uL (ref 0.1–0.9)
MONO%: 7.3 % (ref 0.0–14.0)
NEUT%: 81.5 % — ABNORMAL HIGH (ref 38.4–76.8)
NEUTROS ABS: 6 10*3/uL (ref 1.5–6.5)
PLATELETS: 237 10*3/uL (ref 145–400)
RBC: 3.51 10*6/uL — ABNORMAL LOW (ref 3.70–5.45)
RDW: 13.1 % (ref 11.2–14.5)
WBC: 7.4 10*3/uL (ref 3.9–10.3)

## 2017-01-07 LAB — COMPREHENSIVE METABOLIC PANEL
ALBUMIN: 3.9 g/dL (ref 3.5–5.0)
ALK PHOS: 70 U/L (ref 40–150)
ALT: 14 U/L (ref 0–55)
ANION GAP: 11 meq/L (ref 3–11)
AST: 20 U/L (ref 5–34)
BILIRUBIN TOTAL: 0.49 mg/dL (ref 0.20–1.20)
BUN: 23.3 mg/dL (ref 7.0–26.0)
CO2: 23 mEq/L (ref 22–29)
Calcium: 9.4 mg/dL (ref 8.4–10.4)
Chloride: 102 mEq/L (ref 98–109)
Creatinine: 1.2 mg/dL — ABNORMAL HIGH (ref 0.6–1.1)
EGFR: 44 mL/min/{1.73_m2} — ABNORMAL LOW (ref >60)
GLUCOSE: 217 mg/dL — AB (ref 70–140)
POTASSIUM: 4.5 meq/L (ref 3.5–5.1)
SODIUM: 136 meq/L (ref 136–145)
Total Protein: 6.7 g/dL (ref 6.4–8.3)

## 2017-01-07 MED ORDER — DIPHENHYDRAMINE HCL 50 MG/ML IJ SOLN
50.0000 mg | Freq: Once | INTRAMUSCULAR | Status: AC
  Administered 2017-01-07: 50 mg via INTRAVENOUS

## 2017-01-07 MED ORDER — DEXAMETHASONE SODIUM PHOSPHATE 10 MG/ML IJ SOLN
INTRAMUSCULAR | Status: AC
  Filled 2017-01-07: qty 1

## 2017-01-07 MED ORDER — DEXAMETHASONE SODIUM PHOSPHATE 10 MG/ML IJ SOLN
10.0000 mg | Freq: Once | INTRAMUSCULAR | Status: AC
  Administered 2017-01-07: 10 mg via INTRAVENOUS

## 2017-01-07 MED ORDER — HEPARIN SOD (PORK) LOCK FLUSH 100 UNIT/ML IV SOLN
500.0000 [IU] | Freq: Once | INTRAVENOUS | Status: AC | PRN
Start: 2017-01-07 — End: 2017-01-07
  Administered 2017-01-07: 500 [IU]
  Filled 2017-01-07: qty 5

## 2017-01-07 MED ORDER — SODIUM CHLORIDE 0.9 % IV SOLN
Freq: Once | INTRAVENOUS | Status: DC
Start: 2017-01-07 — End: 2017-01-07

## 2017-01-07 MED ORDER — ACETAMINOPHEN 325 MG PO TABS
650.0000 mg | ORAL_TABLET | Freq: Once | ORAL | Status: AC
  Administered 2017-01-07: 650 mg via ORAL

## 2017-01-07 MED ORDER — SODIUM CHLORIDE 0.9% FLUSH
10.0000 mL | Freq: Once | INTRAVENOUS | Status: AC
  Administered 2017-01-07: 10 mL
  Filled 2017-01-07: qty 10

## 2017-01-07 MED ORDER — SODIUM CHLORIDE 0.9% FLUSH
10.0000 mL | INTRAVENOUS | Status: DC | PRN
  Administered 2017-01-07: 10 mL
  Filled 2017-01-07: qty 10

## 2017-01-07 MED ORDER — DIPHENHYDRAMINE HCL 50 MG/ML IJ SOLN
INTRAMUSCULAR | Status: AC
  Filled 2017-01-07: qty 1

## 2017-01-07 MED ORDER — OBINUTUZUMAB CHEMO INJECTION 1000 MG/40ML
1000.0000 mg | Freq: Once | INTRAVENOUS | Status: AC
  Administered 2017-01-07: 1000 mg via INTRAVENOUS
  Filled 2017-01-07: qty 40

## 2017-01-07 MED ORDER — ACETAMINOPHEN 325 MG PO TABS
ORAL_TABLET | ORAL | Status: AC
  Filled 2017-01-07: qty 2

## 2017-01-07 NOTE — Assessment & Plan Note (Signed)
The patient has intermittent, recurrence of anemia, likely due to anemia of chronic disease due to poorly controlled diabetes and mild chronic kidney disease She is not symptomatic. Observe only for now 

## 2017-01-07 NOTE — Patient Instructions (Signed)
Implanted Port Home Guide An implanted port is a type of central line that is placed under the skin. Central lines are used to provide IV access when treatment or nutrition needs to be given through a person's veins. Implanted ports are used for long-term IV access. An implanted port may be placed because:  You need IV medicine that would be irritating to the small veins in your hands or arms.  You need long-term IV medicines, such as antibiotics.  You need IV nutrition for a long period.  You need frequent blood draws for lab tests.  You need dialysis.  Implanted ports are usually placed in the chest area, but they can also be placed in the upper arm, the abdomen, or the leg. An implanted port has two main parts:  Reservoir. The reservoir is round and will appear as a small, raised area under your skin. The reservoir is the part where a needle is inserted to give medicines or draw blood.  Catheter. The catheter is a thin, flexible tube that extends from the reservoir. The catheter is placed into a large vein. Medicine that is inserted into the reservoir goes into the catheter and then into the vein.  How will I care for my incision site? Do not get the incision site wet. Bathe or shower as directed by your health care provider. How is my port accessed? Special steps must be taken to access the port:  Before the port is accessed, a numbing cream can be placed on the skin. This helps numb the skin over the port site.  Your health care provider uses a sterile technique to access the port. ? Your health care provider must put on a mask and sterile gloves. ? The skin over your port is cleaned carefully with an antiseptic and allowed to dry. ? The port is gently pinched between sterile gloves, and a needle is inserted into the port.  Only "non-coring" port needles should be used to access the port. Once the port is accessed, a blood return should be checked. This helps ensure that the port  is in the vein and is not clogged.  If your port needs to remain accessed for a constant infusion, a clear (transparent) bandage will be placed over the needle site. The bandage and needle will need to be changed every week, or as directed by your health care provider.  Keep the bandage covering the needle clean and dry. Do not get it wet. Follow your health care provider's instructions on how to take a shower or bath while the port is accessed.  If your port does not need to stay accessed, no bandage is needed over the port.  What is flushing? Flushing helps keep the port from getting clogged. Follow your health care provider's instructions on how and when to flush the port. Ports are usually flushed with saline solution or a medicine called heparin. The need for flushing will depend on how the port is used.  If the port is used for intermittent medicines or blood draws, the port will need to be flushed: ? After medicines have been given. ? After blood has been drawn. ? As part of routine maintenance.  If a constant infusion is running, the port may not need to be flushed.  How long will my port stay implanted? The port can stay in for as long as your health care provider thinks it is needed. When it is time for the port to come out, surgery will be   done to remove it. The procedure is similar to the one performed when the port was put in. When should I seek immediate medical care? When you have an implanted port, you should seek immediate medical care if:  You notice a bad smell coming from the incision site.  You have swelling, redness, or drainage at the incision site.  You have more swelling or pain at the port site or the surrounding area.  You have a fever that is not controlled with medicine.  This information is not intended to replace advice given to you by your health care provider. Make sure you discuss any questions you have with your health care provider. Document  Released: 01/14/2005 Document Revised: 06/22/2015 Document Reviewed: 09/21/2012 Elsevier Interactive Patient Education  2017 [employer]  

## 2017-01-07 NOTE — Telephone Encounter (Signed)
Scheduled appt per 12/11 los - Gave patient AVS and calender per los.  

## 2017-01-07 NOTE — Patient Instructions (Signed)
[facility] Discharge Instructions for Patients Receiving Chemotherapy  Today you received the following chemotherapy agents Obinutuzumab  To help prevent nausea and vomiting after your treatment, we encourage you to take your nausea medication as directed  If you develop nausea and vomiting that is not controlled by your nausea medication, call the clinic.   BELOW ARE SYMPTOMS THAT SHOULD BE REPORTED IMMEDIATELY:  *FEVER GREATER THAN 100.5 F  *CHILLS WITH OR WITHOUT FEVER  NAUSEA AND VOMITING THAT IS NOT CONTROLLED WITH YOUR NAUSEA MEDICATION  *UNUSUAL SHORTNESS OF BREATH  *UNUSUAL BRUISING OR BLEEDING  TENDERNESS IN MOUTH AND THROAT WITH OR WITHOUT PRESENCE OF ULCERS  *URINARY PROBLEMS  *BOWEL PROBLEMS  UNUSUAL RASH Items with * indicate a potential emergency and should be followed up as soon as possible.  Feel free to call the clinic should you have any questions or concerns. The clinic phone number is [phone].  Please show the [place] at check-in to the Emergency Department and triage nurse.

## 2017-01-07 NOTE — Progress Notes (Signed)
[place] OFFICE PROGRESS NOTE  Patient Care Team: [name], Bill Salinas, MD as PCP - General (Family Medicine) Sharyne Peach, MD as Consulting Physician (Ophthalmology) Inda Castle, MD (Inactive) as Consulting Physician (Gastroenterology) Sypher, Herbie Baltimore, MD (Inactive) as Consulting Physician (Orthopedic Surgery) Jannette Spanner, MD as Referring Physician (Dermatology) Heath Lark, MD as Consulting Physician (Hematology and Oncology) Alphonsa Overall, MD as Consulting Physician (General Surgery)  SUMMARY OF ONCOLOGIC HISTORY: Oncology History   Marginal zone lymphoma, with lymph node and bone marrow involvement along with splenomegaly   Primary site: Lymphoid Neoplasms   Staging method: AJCC 6th Edition   Clinical: Stage IV signed by Heath Lark, MD on 09/02/2013  7:38 PM   Summary: Stage IV       Marginal zone lymphoma ([place])   07/29/2013 Imaging    CT scan shows splenomegaly and abnormal lymphadenopathy.      08/13/2013 Imaging    PET CT scan confirmed lymphadenopathy and splenomegaly, those areas are PET avid      08/23/2013 Surgery    [id number] left axillary lymph node biopsy confirmed a low-grade lymphoma.      08/27/2013 Bone Marrow Biopsy    Bone marrow biopsy confirmed lymphoma.[id number]      09/08/2013 - 09/29/2013 Chemotherapy    She completed 4 cycles of rituximab with resolution of splenomegaly.      12/29/2013 Imaging    PET scan show complete response.      07/11/2014 Imaging    PET scan showed recurrent disease      07/19/2014 - 08/09/2014 Chemotherapy    She completed 4 cycles of rituximab with resolution of splenomegaly      10/12/2014 Imaging    Repeat PET/CT scan showed near complete response to treatment.      10/13/2014 - 02/09/2015 Chemotherapy    She is placed on rituximab maintenance therapy.      03/29/2015 Imaging    PET CT showed possible mild progression of splenomegaly      04/26/2015 - 02/22/2016 Chemotherapy    She  started taking Ibrutinib      04/28/2015 Adverse Reaction    Treatment was placed temporarily on hold due to severe diarrhea and resumed at reduced dose      07/03/2015 PET scan    No hypermetabolic adenopathy in the neck, chest, abdomen or pelvis.2. Spleen is not hypermetabolic and appears slightly less prominent size.      02/06/2016 PET scan    Hypermetabolic LEFT axillary lymph nodes. Concern for HIGH-GRADE LYMPHOMA LOCALIZED RECURRENCE. 2. Prominent spleen without hypermetabolic activity. No change in size from comparison. 3. No additional hypermetabolic adenopathy on scan.      02/19/2016 Procedure    Technically successful ultrasound-guided core biopsy, left axillary adenopathy.      02/19/2016 Pathology Results    Lymph node, needle/core biopsy, left axilla - ATYPICAL LYMPHOID PROLIFERATION SUSPICIOUS FOR LYMPHOMA. - SEE COMMENT. Microscopic Comment The sections show multiple very small and skinny fragments of lymph nodal tissue displaying areas of lymphoid expansion by a population of small to medium size lymphocytes with round to irregular nuclei, scanty to moderately abundant cytoplasm, dense chromatin and inconspicuous nucleoli. This is associated with scattering of "naked" germinal centers with abundant tingible body macrophages. To further evaluate this process, flow cytometric analysis was performed and shows a minor population of monoclonal B cells expressing pan B-cell antigens including CD20 with associated kappa light chain restriction. In addition, immunohistochemical stains were performed including CD20, CD79a, CD3, CD5, CD10, BCL-2,  CD21 with appropriate controls. The stains show a mixture of T and B cells with slight predominance of B cells. CD21 highlights numerous areas with dendritic networks, correlating with the presence of germinal centers. CD10 shows focal positivity likely in germinal centers. BCL-2 is diffusely positive except in areas of apparent germinal centers.  The overall features are atypical and suspicious for involvement by a B-cell lymphoproliferative process, particularly given the B-cell monoclonality by flow cytometry. However, morphologic evaluation is extremely limited due to small biopsy fragments and additional material (excisional biopsy) is strongly recommended for further evaluation. (BNS:ecj 02/21/2016)       03/04/2016 Procedure    Placement of single lumen port a cath via right internal jugular vein. The catheter tip lies at the cavo-atrial junction. A power injectable port a cath was placed and is ready for immediate use.      03/07/2016 - 05/31/2016 Chemotherapy    She received treatment with bendamustine and Gazyva      05/27/2016 PET scan    1. No evidence of active lymphoma on whole-body scan. 2. Resolution of metabolic activity previously identified in the LEFT axilla. 3. Small amount free fluid the pelvis.       INTERVAL HISTORY: Please see below for problem oriented charting. She returns for further follow-up She has no new lymphadenopathy She has mild upper respiratory tract infection, improving with conservative management Minimum cough Appetite is stable, no recent weight loss  REVIEW OF SYSTEMS:   Constitutional: Denies fevers, chills or abnormal weight loss Eyes: Denies blurriness of vision Ears, nose, mouth, throat, and face: Denies mucositis or sore throat Cardiovascular: Denies palpitation, chest discomfort or lower extremity swelling Gastrointestinal:  Denies nausea, heartburn or change in bowel habits Skin: Denies abnormal skin rashes Lymphatics: Denies new lymphadenopathy or easy bruising Neurological:Denies numbness, tingling or new weaknesses Behavioral/Psych: Mood is stable, no new changes  All other systems were reviewed with the patient and are negative.  I have reviewed the past medical history, past surgical history, social history and family history with the patient and they are unchanged from  previous note.  ALLERGIES:  is allergic to aspirin; capoten [captopril]; levemir [insulin detemir]; micronase [glyburide]; onglyza [saxagliptin]; statins; and zocor [simvastatin].  MEDICATIONS:  Current Outpatient Medications  Medication Sig Dispense Refill  . acyclovir (ZOVIRAX) 400 MG tablet TAKE ONE TABLET BY MOUTH ONCE DAILY 30 tablet 5  . amLODipine (NORVASC) 10 MG tablet Take 1 tablet (10 mg total) by mouth at bedtime. 90 tablet 3  . aspirin 81 MG tablet Take 40.5 mg by mouth 3 (three) times a week.    . Calcium Carbonate-Vit D-Min (CALCIUM 1200 PO) Take 1 tablet by mouth daily. Per patient takes in am and pm    . cholecalciferol (VITAMIN D) 1000 UNITS tablet Take 1,000 Units by mouth daily.     . enalapril (VASOTEC) 20 MG tablet Take 20 mg by mouth every morning.    . fish oil-omega-3 fatty acids 1000 MG capsule Take 1 g by mouth daily.     Marland Kitchen glimepiride (AMARYL) 4 MG tablet Take 4 mg by mouth daily with breakfast. Per patient takes in am and pm.    . glucose blood test strip Use as instructed 100 each 12  . lidocaine-prilocaine (EMLA) cream Apply 1 application topically as needed. 30 g 3  . loperamide (IMODIUM) 2 MG capsule Take by mouth as needed for diarrhea or loose stools.    Marland Kitchen LORazepam (ATIVAN) 0.5 MG tablet Take 1 tablet (0.5  mg total) by mouth every 8 (eight) hours as needed for anxiety. 30 tablet 0  . Magnesium 500 MG CAPS Take 500 mg by mouth daily.     . metFORMIN (GLUCOPHAGE) 1000 MG tablet Take 1,000 mg by mouth 2 (two) times daily with a meal.     . Misc Natural Products (LUTEIN 20 PO) Take by mouth daily.    . Multiple Vitamins-Minerals (CENTRUM CARDIO) TABS Take 1 tablet by mouth daily.    . Multiple Vitamins-Minerals (ICAPS) CAPS Take by mouth daily.     No current facility-administered medications for this visit.    Facility-Administered Medications Ordered in Other Visits  Medication Dose Route Frequency Provider Last Rate Last Dose  . dexamethasone (DECADRON)  injection 10 mg  10 mg Intravenous Once Alvy Bimler, Aldrin Engelhard, MD   10 mg at 01/07/17 1142  . heparin lock flush 100 unit/mL  500 Units Intracatheter Once PRN Alvy Bimler, Randa Riss, MD      . obinutuzumab (GAZYVA) 1,000 mg in sodium chloride 0.9 % 250 mL (3.4483 mg/mL) chemo infusion  1,000 mg Intravenous Once Alvy Bimler, Carey Johndrow, MD      . sodium chloride flush (NS) 0.9 % injection 10 mL  10 mL Intracatheter PRN Alvy Bimler, Kessa Fairbairn, MD        PHYSICAL EXAMINATION: ECOG PERFORMANCE STATUS: 1 - Symptomatic but completely ambulatory  Vitals:   01/07/17 1053  BP: (!) 147/71  Pulse: 92  Resp: 20  Temp: 98.9 F (37.2 C)  SpO2: 99%   Filed Weights   01/07/17 1053  Weight: 157 lb 6.4 oz (71.4 kg)    GENERAL:alert, no distress and comfortable SKIN: skin color, texture, turgor are normal, no rashes or significant lesions EYES: normal, Conjunctiva are pink and non-injected, sclera clear OROPHARYNX:no exudate, no erythema and lips, buccal mucosa, and tongue normal  NECK: supple, thyroid normal size, non-tender, without nodularity LYMPH:  no palpable lymphadenopathy in the cervical, axillary or inguinal LUNGS: clear to auscultation and percussion with normal breathing effort HEART: regular rate & rhythm and no murmurs and no lower extremity edema ABDOMEN:abdomen soft, non-tender and normal bowel sounds Musculoskeletal:no cyanosis of digits and no clubbing  NEURO: alert & oriented x 3 with fluent speech, no focal motor/sensory deficits  LABORATORY DATA:  I have reviewed the data as listed    Component Value Date/Time   NA 136 01/07/2017 1004   K 4.5 01/07/2017 1004   CL 106 02/19/2016 1120   CO2 23 01/07/2017 1004   GLUCOSE 217 (H) 01/07/2017 1004   BUN 23.3 01/07/2017 1004   CREATININE 1.2 (H) 01/07/2017 1004   CALCIUM 9.4 01/07/2017 1004   PROT 6.7 01/07/2017 1004   ALBUMIN 3.9 01/07/2017 1004   AST 20 01/07/2017 1004   ALT 14 01/07/2017 1004   ALKPHOS 70 01/07/2017 1004   BILITOT 0.49 01/07/2017 1004    GFRNONAA >60 02/19/2016 1120   GFRNONAA 47 (L) 05/18/2013 1256   GFRAA >60 02/19/2016 1120   GFRAA 55 (L) 05/18/2013 1256    No results found for: SPEP, UPEP  Lab Results  Component Value Date   WBC 7.4 01/07/2017   NEUTROABS 6.0 01/07/2017   HGB 10.6 (L) 01/07/2017   HCT 31.4 (L) 01/07/2017   MCV 89.5 01/07/2017   PLT 237 01/07/2017      Chemistry      Component Value Date/Time   NA 136 01/07/2017 1004   K 4.5 01/07/2017 1004   CL 106 02/19/2016 1120   CO2 23 01/07/2017 1004  BUN 23.3 01/07/2017 1004   CREATININE 1.2 (H) 01/07/2017 1004   GLU 202 (H) 08/09/2014 1000      Component Value Date/Time   CALCIUM 9.4 01/07/2017 1004   ALKPHOS 70 01/07/2017 1004   AST 20 01/07/2017 1004   ALT 14 01/07/2017 1004   BILITOT 0.49 01/07/2017 1004      ASSESSMENT & PLAN:  Marginal zone lymphoma She has excellent response to treatment with minimum side effects, except for anemia The risks, benefits, side effects and maintenance treatment is discussed with the patient and she agreed to proceed Clinically, she has no signs of disease. I plan to repeat imaging study again next year  Anemia in neoplastic disease The patient has intermittent, recurrence of anemia, likely due to anemia of chronic disease due to poorly controlled diabetes and mild chronic kidney disease She is not symptomatic. Observe only for now  Chronic kidney disease (CKD), stage III (moderate) Her kidney function tests is stable. Continue close monitoring. There is no contraindication to continue treatment for now.   Viral URI She has mild symptoms of URI She is improving Observe only; will proceed with treatment today without delay   Orders Placed This Encounter  Procedures  . NM PET Image Restag (PS) Skull Base To Thigh    Standing Status:   Future    Standing Expiration Date:   01/07/2018    Order Specific Question:   If indicated for the ordered procedure, I authorize the administration of a  radiopharmaceutical per Radiology protocol    Answer:   Yes    Order Specific Question:   Preferred imaging location?    Answer:   [facility]    Order Specific Question:   Radiology Contrast Protocol - do NOT remove file path    Answer:   [email or web address]   All questions were answered. The patient knows to call the clinic with any problems, questions or concerns. No barriers to learning was detected. I spent 15 minutes counseling the patient face to face. The total time spent in the appointment was 20 minutes and more than 50% was on counseling and review of test results     Heath Lark, MD 01/07/2017 11:44 AM

## 2017-01-07 NOTE — Assessment & Plan Note (Signed)
Her kidney function tests is stable. Continue close monitoring. There is no contraindication to continue treatment for now.  

## 2017-01-07 NOTE — Assessment & Plan Note (Signed)
She has excellent response to treatment with minimum side effects, except for anemia The risks, benefits, side effects and maintenance treatment is discussed with the patient and she agreed to proceed Clinically, she has no signs of disease. I plan to repeat imaging study again next year

## 2017-01-07 NOTE — Assessment & Plan Note (Addendum)
She has mild symptoms of URI She is improving Observe only; will proceed with treatment today without delay

## 2017-01-22 ENCOUNTER — Encounter: Payer: Self-pay | Admitting: Podiatry

## 2017-01-22 ENCOUNTER — Ambulatory Visit (INDEPENDENT_AMBULATORY_CARE_PROVIDER_SITE_OTHER): Payer: Medicare Other | Admitting: Podiatry

## 2017-01-22 DIAGNOSIS — M79675 Pain in left toe(s): Secondary | ICD-10-CM

## 2017-01-22 DIAGNOSIS — B351 Tinea unguium: Secondary | ICD-10-CM

## 2017-01-22 DIAGNOSIS — M79674 Pain in right toe(s): Secondary | ICD-10-CM | POA: Diagnosis not present

## 2017-01-22 DIAGNOSIS — E119 Type 2 diabetes mellitus without complications: Secondary | ICD-10-CM | POA: Diagnosis not present

## 2017-01-22 NOTE — Progress Notes (Signed)
   Subjective:    Patient ID: Jamie Mendez, female    DOB: 1933-09-16, 83 y.o.   MRN: [id number]  HPIthis patient presents the office with chief complaint of long thick painful nails.  These nails are painful walking and wearing her shoes.  She says her doctor is concerned about her nails now that she is under chemotherapy.  This patient does have type 2 diabetes .  She presents the office today for an evaluation and treatment of these painful nails    Review of Systems  All other systems reviewed and are negative.      Objective:   Physical Exam General Appearance  Alert, conversant and in no acute stress.  Vascular  Dorsalis pedis and posterior pulses are palpable  bilaterally.  Capillary return is within normal limits  bilaterally. Temperature is within normal limits  Bilaterally.  Neurologic  Senn-Weinstein monofilament wire test within normal limits  bilaterally. Muscle power within normal limits bilaterally.  Nails Thick disfigured discolored nails with subungual debris bilaterally from hallux to fifth toes bilaterally. No evidence of bacterial infection or drainage bilaterally.  Orthopedic  No limitations of motion of motion feet bilaterally.  No crepitus or effusions noted.  No bony pathology or digital deformities noted.  Skin  normotropic skin with no porokeratosis noted bilaterally.  No signs of infections or ulcers noted.          Assessment & Plan:  Onychomycosis  B/L  Diabetes with no complications of feet.   IE  Debridement of nails  X 10.  RTC 3 months.   Gardiner Barefoot DPM

## 2017-03-02 ENCOUNTER — Other Ambulatory Visit: Payer: Self-pay | Admitting: Hematology and Oncology

## 2017-03-02 DIAGNOSIS — C8580 Other specified types of non-Hodgkin lymphoma, unspecified site: Secondary | ICD-10-CM

## 2017-03-07 ENCOUNTER — Ambulatory Visit (HOSPITAL_COMMUNITY)
Admission: RE | Admit: 2017-03-07 | Discharge: 2017-03-07 | Disposition: A | Discharge status: Home / Self Care | Payer: Medicare Other | Source: Ambulatory Visit | Attending: Hematology and Oncology | Admitting: Hematology and Oncology

## 2017-03-07 DIAGNOSIS — C8580 Other specified types of non-Hodgkin lymphoma, unspecified site: Secondary | ICD-10-CM | POA: Diagnosis present

## 2017-03-07 DIAGNOSIS — K828 Other specified diseases of gallbladder: Secondary | ICD-10-CM | POA: Diagnosis not present

## 2017-03-07 DIAGNOSIS — I700 Atherosclerosis of aorta: Secondary | ICD-10-CM | POA: Diagnosis not present

## 2017-03-07 DIAGNOSIS — I2510 Atherosclerotic heart disease of native coronary artery without angina pectoris: Secondary | ICD-10-CM | POA: Diagnosis not present

## 2017-03-07 DIAGNOSIS — J329 Chronic sinusitis, unspecified: Secondary | ICD-10-CM | POA: Insufficient documentation

## 2017-03-07 LAB — GLUCOSE, CAPILLARY: GLUCOSE-CAPILLARY: 123 mg/dL — AB (ref 65–99)

## 2017-03-07 MED ORDER — FLUDEOXYGLUCOSE F - 18 (FDG) INJECTION
7.7000 | Freq: Once | INTRAVENOUS | Status: AC | PRN
  Administered 2017-03-07: 7.7 via INTRAVENOUS

## 2017-03-10 ENCOUNTER — Inpatient Hospital Stay (HOSPITAL_BASED_OUTPATIENT_CLINIC_OR_DEPARTMENT_OTHER): Payer: Medicare Other | Admitting: Hematology and Oncology

## 2017-03-10 ENCOUNTER — Inpatient Hospital Stay: Payer: Medicare Other | Attending: Hematology and Oncology

## 2017-03-10 ENCOUNTER — Inpatient Hospital Stay: Payer: Medicare Other

## 2017-03-10 ENCOUNTER — Encounter: Payer: Self-pay | Admitting: Hematology and Oncology

## 2017-03-10 ENCOUNTER — Telehealth: Payer: Self-pay | Admitting: Hematology and Oncology

## 2017-03-10 VITALS — BP 144/63 | HR 98 | Temp 98.5°F | Resp 17

## 2017-03-10 DIAGNOSIS — C44609 Unspecified malignant neoplasm of skin of left upper limb, including shoulder: Secondary | ICD-10-CM

## 2017-03-10 DIAGNOSIS — C8588 Other specified types of non-Hodgkin lymphoma, lymph nodes of multiple sites: Secondary | ICD-10-CM | POA: Insufficient documentation

## 2017-03-10 DIAGNOSIS — D630 Anemia in neoplastic disease: Secondary | ICD-10-CM | POA: Diagnosis not present

## 2017-03-10 DIAGNOSIS — N183 Chronic kidney disease, stage 3 unspecified: Secondary | ICD-10-CM

## 2017-03-10 DIAGNOSIS — C8580 Other specified types of non-Hodgkin lymphoma, unspecified site: Secondary | ICD-10-CM

## 2017-03-10 DIAGNOSIS — Z5112 Encounter for antineoplastic immunotherapy: Secondary | ICD-10-CM | POA: Diagnosis present

## 2017-03-10 DIAGNOSIS — C44709 Unspecified malignant neoplasm of skin of left lower limb, including hip: Secondary | ICD-10-CM

## 2017-03-10 DIAGNOSIS — Z95828 Presence of other vascular implants and grafts: Secondary | ICD-10-CM

## 2017-03-10 DIAGNOSIS — I10 Essential (primary) hypertension: Secondary | ICD-10-CM

## 2017-03-10 DIAGNOSIS — C4499 Other specified malignant neoplasm of skin, unspecified: Secondary | ICD-10-CM

## 2017-03-10 LAB — CBC WITH DIFFERENTIAL/PLATELET
BASOS ABS: 0 10*3/uL (ref 0.0–0.1)
Basophils Relative: 0 %
EOS ABS: 0.1 10*3/uL (ref 0.0–0.5)
EOS PCT: 1 %
HCT: 30.7 % — ABNORMAL LOW (ref 34.8–46.6)
Hemoglobin: 10.4 g/dL — ABNORMAL LOW (ref 11.6–15.9)
Lymphocytes Relative: 11 %
Lymphs Abs: 0.6 10*3/uL — ABNORMAL LOW (ref 0.9–3.3)
MCH: 30.7 pg (ref 25.1–34.0)
MCHC: 33.9 g/dL (ref 31.5–36.0)
MCV: 90.6 fL (ref 79.5–101.0)
Monocytes Absolute: 0.4 10*3/uL (ref 0.1–0.9)
Monocytes Relative: 8 %
NEUTROS PCT: 80 %
Neutro Abs: 4.7 10*3/uL (ref 1.5–6.5)
PLATELETS: 216 10*3/uL (ref 145–400)
RBC: 3.39 MIL/uL — AB (ref 3.70–5.45)
RDW: 13.3 % (ref 11.2–14.5)
WBC: 5.8 10*3/uL (ref 3.9–10.3)

## 2017-03-10 LAB — COMPREHENSIVE METABOLIC PANEL
ALT: 18 U/L (ref 0–55)
AST: 19 U/L (ref 5–34)
Albumin: 3.7 g/dL (ref 3.5–5.0)
Alkaline Phosphatase: 77 U/L (ref 40–150)
Anion gap: 10 (ref 3–11)
BILIRUBIN TOTAL: 0.5 mg/dL (ref 0.2–1.2)
BUN: 23 mg/dL (ref 7–26)
CO2: 26 mmol/L (ref 22–29)
CREATININE: 1.24 mg/dL — AB (ref 0.60–1.10)
Calcium: 9.4 mg/dL (ref 8.4–10.4)
Chloride: 102 mmol/L (ref 98–109)
GFR, EST AFRICAN AMERICAN: 46 mL/min — AB (ref >60)
GFR, EST NON AFRICAN AMERICAN: 40 mL/min — AB (ref >60)
Glucose, Bld: 223 mg/dL — ABNORMAL HIGH (ref 70–140)
POTASSIUM: 4.4 mmol/L (ref 3.5–5.1)
Sodium: 138 mmol/L (ref 136–145)
TOTAL PROTEIN: 6.3 g/dL — AB (ref 6.4–8.3)

## 2017-03-10 MED ORDER — DEXAMETHASONE SODIUM PHOSPHATE 10 MG/ML IJ SOLN
INTRAMUSCULAR | Status: AC
  Filled 2017-03-10: qty 1

## 2017-03-10 MED ORDER — HEPARIN SOD (PORK) LOCK FLUSH 100 UNIT/ML IV SOLN
500.0000 [IU] | Freq: Once | INTRAVENOUS | Status: AC | PRN
  Administered 2017-03-10: 500 [IU]
  Filled 2017-03-10: qty 5

## 2017-03-10 MED ORDER — ACETAMINOPHEN 325 MG PO TABS
ORAL_TABLET | ORAL | Status: AC
  Filled 2017-03-10: qty 2

## 2017-03-10 MED ORDER — DEXAMETHASONE SODIUM PHOSPHATE 10 MG/ML IJ SOLN
10.0000 mg | Freq: Once | INTRAMUSCULAR | Status: AC
  Administered 2017-03-10: 10 mg via INTRAVENOUS

## 2017-03-10 MED ORDER — DIPHENHYDRAMINE HCL 50 MG/ML IJ SOLN
50.0000 mg | Freq: Once | INTRAMUSCULAR | Status: AC
  Administered 2017-03-10: 50 mg via INTRAVENOUS

## 2017-03-10 MED ORDER — SODIUM CHLORIDE 0.9% FLUSH
10.0000 mL | Freq: Once | INTRAVENOUS | Status: AC
  Administered 2017-03-10: 10 mL
  Filled 2017-03-10: qty 10

## 2017-03-10 MED ORDER — SODIUM CHLORIDE 0.9 % IV SOLN
1000.0000 mg | Freq: Once | INTRAVENOUS | Status: AC
  Administered 2017-03-10: 1000 mg via INTRAVENOUS
  Filled 2017-03-10: qty 40

## 2017-03-10 MED ORDER — DIPHENHYDRAMINE HCL 25 MG PO CAPS
ORAL_CAPSULE | ORAL | Status: AC
  Filled 2017-03-10: qty 2

## 2017-03-10 MED ORDER — SODIUM CHLORIDE 0.9 % IV SOLN
Freq: Once | INTRAVENOUS | Status: AC
  Administered 2017-03-10: 10:00:00 via INTRAVENOUS

## 2017-03-10 MED ORDER — SODIUM CHLORIDE 0.9% FLUSH
10.0000 mL | INTRAVENOUS | Status: DC | PRN
  Administered 2017-03-10: 10 mL
  Filled 2017-03-10: qty 10

## 2017-03-10 MED ORDER — ACETAMINOPHEN 325 MG PO TABS
650.0000 mg | ORAL_TABLET | Freq: Once | ORAL | Status: AC
  Administered 2017-03-10: 650 mg via ORAL

## 2017-03-10 MED ORDER — DIPHENHYDRAMINE HCL 50 MG/ML IJ SOLN
INTRAMUSCULAR | Status: AC
  Filled 2017-03-10: qty 1

## 2017-03-10 NOTE — Assessment & Plan Note (Signed)
She had recurrent skin cancer from squamous cell carcinoma She has appointment scheduled to see her dermatologist for resection I warned the patient about high risk of skin cancer and melanoma due to her lymphoma diagnosis The patient is educated about the importance of skin protection and avoidance of of excessive sun exposure

## 2017-03-10 NOTE — Assessment & Plan Note (Signed)
she will continue current medical management. I recommend close follow-up with primary care doctor for medication adjustment.  

## 2017-03-10 NOTE — Patient Instructions (Signed)
[facility] Discharge Instructions for Patients Receiving Chemotherapy  Today you received the following chemotherapy agents Obinutuzumab  To help prevent nausea and vomiting after your treatment, we encourage you to take your nausea medication as directed  If you develop nausea and vomiting that is not controlled by your nausea medication, call the clinic.   BELOW ARE SYMPTOMS THAT SHOULD BE REPORTED IMMEDIATELY:  *FEVER GREATER THAN 100.5 F  *CHILLS WITH OR WITHOUT FEVER  NAUSEA AND VOMITING THAT IS NOT CONTROLLED WITH YOUR NAUSEA MEDICATION  *UNUSUAL SHORTNESS OF BREATH  *UNUSUAL BRUISING OR BLEEDING  TENDERNESS IN MOUTH AND THROAT WITH OR WITHOUT PRESENCE OF ULCERS  *URINARY PROBLEMS  *BOWEL PROBLEMS  UNUSUAL RASH Items with * indicate a potential emergency and should be followed up as soon as possible.  Feel free to call the clinic should you have any questions or concerns. The clinic phone number is [phone].  Please show the [facility] at check-in to the Emergency Department and triage nurse.

## 2017-03-10 NOTE — Progress Notes (Signed)
[place] OFFICE PROGRESS NOTE  Patient Care Team: [name], Bill Salinas, MD as PCP - General (Family Medicine) Sharyne Peach, MD as Consulting Physician (Ophthalmology) Inda Castle, MD (Inactive) as Consulting Physician (Gastroenterology) Sypher, Herbie Baltimore, MD (Inactive) as Consulting Physician (Orthopedic Surgery) Jannette Spanner, MD as Referring Physician (Dermatology) Heath Lark, MD as Consulting Physician (Hematology and Oncology) Alphonsa Overall, MD as Consulting Physician (General Surgery)  SUMMARY OF ONCOLOGIC HISTORY: Oncology History   Marginal zone lymphoma, with lymph node and bone marrow involvement along with splenomegaly   Primary site: Lymphoid Neoplasms   Staging method: AJCC 6th Edition   Clinical: Stage IV signed by Heath Lark, MD on 09/02/2013  7:38 PM   Summary: Stage IV       Marginal zone lymphoma ([place])   07/29/2013 Imaging    CT scan shows splenomegaly and abnormal lymphadenopathy.      08/13/2013 Imaging    PET CT scan confirmed lymphadenopathy and splenomegaly, those areas are PET avid      08/23/2013 Surgery    [id number] left axillary lymph node biopsy confirmed a low-grade lymphoma.      08/27/2013 Bone Marrow Biopsy    Bone marrow biopsy confirmed lymphoma.[id number]      09/08/2013 - 09/29/2013 Chemotherapy    She completed 4 cycles of rituximab with resolution of splenomegaly.      12/29/2013 Imaging    PET scan show complete response.      07/11/2014 Imaging    PET scan showed recurrent disease      07/19/2014 - 08/09/2014 Chemotherapy    She completed 4 cycles of rituximab with resolution of splenomegaly      10/12/2014 Imaging    Repeat PET/CT scan showed near complete response to treatment.      10/13/2014 - 02/09/2015 Chemotherapy    She is placed on rituximab maintenance therapy.      03/29/2015 Imaging    PET CT showed possible mild progression of splenomegaly      04/26/2015 - 02/22/2016 Chemotherapy    She  started taking Ibrutinib      04/28/2015 Adverse Reaction    Treatment was placed temporarily on hold due to severe diarrhea and resumed at reduced dose      07/03/2015 PET scan    No hypermetabolic adenopathy in the neck, chest, abdomen or pelvis.2. Spleen is not hypermetabolic and appears slightly less prominent size.      02/06/2016 PET scan    Hypermetabolic LEFT axillary lymph nodes. Concern for HIGH-GRADE LYMPHOMA LOCALIZED RECURRENCE. 2. Prominent spleen without hypermetabolic activity. No change in size from comparison. 3. No additional hypermetabolic adenopathy on scan.      02/19/2016 Procedure    Technically successful ultrasound-guided core biopsy, left axillary adenopathy.      02/19/2016 Pathology Results    Lymph node, needle/core biopsy, left axilla - ATYPICAL LYMPHOID PROLIFERATION SUSPICIOUS FOR LYMPHOMA. - SEE COMMENT. Microscopic Comment The sections show multiple very small and skinny fragments of lymph nodal tissue displaying areas of lymphoid expansion by a population of small to medium size lymphocytes with round to irregular nuclei, scanty to moderately abundant cytoplasm, dense chromatin and inconspicuous nucleoli. This is associated with scattering of "naked" germinal centers with abundant tingible body macrophages. To further evaluate this process, flow cytometric analysis was performed and shows a minor population of monoclonal B cells expressing pan B-cell antigens including CD20 with associated kappa light chain restriction. In addition, immunohistochemical stains were performed including CD20, CD79a, CD3, CD5, CD10, BCL-2,  CD21 with appropriate controls. The stains show a mixture of T and B cells with slight predominance of B cells. CD21 highlights numerous areas with dendritic networks, correlating with the presence of germinal centers. CD10 shows focal positivity likely in germinal centers. BCL-2 is diffusely positive except in areas of apparent germinal centers.  The overall features are atypical and suspicious for involvement by a B-cell lymphoproliferative process, particularly given the B-cell monoclonality by flow cytometry. However, morphologic evaluation is extremely limited due to small biopsy fragments and additional material (excisional biopsy) is strongly recommended for further evaluation. (BNS:ecj 02/21/2016)       03/04/2016 Procedure    Placement of single lumen port a cath via right internal jugular vein. The catheter tip lies at the cavo-atrial junction. A power injectable port a cath was placed and is ready for immediate use.      03/07/2016 - 05/31/2016 Chemotherapy    She received treatment with bendamustine and Gazyva      05/27/2016 PET scan    1. No evidence of active lymphoma on whole-body scan. 2. Resolution of metabolic activity previously identified in the LEFT axilla. 3. Small amount free fluid the pelvis.      03/07/2017 PET scan    1. No findings of active lymphoma in the chest, abdomen, or pelvis. 2. Other imaging findings of potential clinical significance: Aortic Atherosclerosis (ICD10-I70.0). Coronary atherosclerosis. Chronic paranasal sinusitis. Moderately distended gallbladder, chronic       INTERVAL HISTORY: Please see below for problem oriented charting. She returns for cycle 5 of maintenance treatment She denies new lymphadenopathy No recent infection She had recurrence of skin cancer over her left forearm and left lower leg, with appointment pending to see dermatologist  REVIEW OF SYSTEMS:   Constitutional: Denies fevers, chills or abnormal weight loss Eyes: Denies blurriness of vision Ears, nose, mouth, throat, and face: Denies mucositis or sore throat Respiratory: Denies cough, dyspnea or wheezes Cardiovascular: Denies palpitation, chest discomfort or lower extremity swelling Gastrointestinal:  Denies nausea, heartburn or change in bowel habits Lymphatics: Denies new lymphadenopathy or easy  bruising Neurological:Denies numbness, tingling or new weaknesses Behavioral/Psych: Mood is stable, no new changes  All other systems were reviewed with the patient and are negative.  I have reviewed the past medical history, past surgical history, social history and family history with the patient and they are unchanged from previous note.  ALLERGIES:  is allergic to aspirin; capoten [captopril]; levemir [insulin detemir]; micronase [glyburide]; onglyza [saxagliptin]; statins; and zocor [simvastatin].  MEDICATIONS:  Current Outpatient Medications  Medication Sig Dispense Refill  . acyclovir (ZOVIRAX) 400 MG tablet TAKE 1 TABLET BY MOUTH ONCE DAILY 30 tablet 5  . amLODipine (NORVASC) 10 MG tablet Take 1 tablet (10 mg total) by mouth at bedtime. 90 tablet 3  . aspirin 81 MG tablet Take 40.5 mg by mouth 3 (three) times a week.    . Calcium Carbonate-Vit D-Min (CALCIUM 1200 PO) Take 1 tablet by mouth daily. Per patient takes in am and pm    . cholecalciferol (VITAMIN D) 1000 UNITS tablet Take 1,000 Units by mouth daily.     . enalapril (VASOTEC) 20 MG tablet Take 20 mg by mouth every morning.    . fish oil-omega-3 fatty acids 1000 MG capsule Take 1 g by mouth daily.     Marland Kitchen glimepiride (AMARYL) 4 MG tablet Take 4 mg by mouth daily with breakfast. Per patient takes in am and pm.    . glucose blood test strip Use  as instructed 100 each 12  . lidocaine-prilocaine (EMLA) cream Apply 1 application topically as needed. 30 g 3  . loperamide (IMODIUM) 2 MG capsule Take by mouth as needed for diarrhea or loose stools.    Marland Kitchen LORazepam (ATIVAN) 0.5 MG tablet Take 1 tablet (0.5 mg total) by mouth every 8 (eight) hours as needed for anxiety. 30 tablet 0  . Magnesium 500 MG CAPS Take 500 mg by mouth daily.     . metFORMIN (GLUCOPHAGE) 1000 MG tablet Take 1,000 mg by mouth 2 (two) times daily with a meal.     . Misc Natural Products (LUTEIN 20 PO) Take by mouth daily.    . Multiple Vitamins-Minerals (CENTRUM  CARDIO) TABS Take 1 tablet by mouth daily.    . Multiple Vitamins-Minerals (ICAPS) CAPS Take by mouth daily.     No current facility-administered medications for this visit.    Facility-Administered Medications Ordered in Other Visits  Medication Dose Route Frequency Provider Last Rate Last Dose  . heparin lock flush 100 unit/mL  500 Units Intracatheter Once PRN Alvy Bimler, Tobby Fawcett, MD      . sodium chloride flush (NS) 0.9 % injection 10 mL  10 mL Intracatheter PRN Alvy Bimler, Geoffry Bannister, MD        PHYSICAL EXAMINATION: ECOG PERFORMANCE STATUS: 1 - Symptomatic but completely ambulatory  Vitals:   03/10/17 0921  BP: (!) 169/68  Pulse: 87  Resp: 18  Temp: 98.6 F (37 C)  SpO2: 98%   Filed Weights   03/10/17 0921  Weight: 156 lb 11.2 oz (71.1 kg)    GENERAL:alert, no distress and comfortable SKIN: skin color, texture, turgor are normal, no rashes or significant lesions EYES: normal, Conjunctiva are pink and non-injected, sclera clear OROPHARYNX:no exudate, no erythema and lips, buccal mucosa, and tongue normal  NECK: supple, thyroid normal size, non-tender, without nodularity LYMPH:  no palpable lymphadenopathy in the cervical, axillary or inguinal LUNGS: clear to auscultation and percussion with normal breathing effort HEART: regular rate & rhythm and no murmurs and no lower extremity edema ABDOMEN:abdomen soft, non-tender and normal bowel sounds Musculoskeletal:no cyanosis of digits and no clubbing  NEURO: alert & oriented x 3 with fluent speech, no focal motor/sensory deficits  LABORATORY DATA:  I have reviewed the data as listed    Component Value Date/Time   NA 138 03/10/2017 0833   NA 136 01/07/2017 1004   K 4.4 03/10/2017 0833   K 4.5 01/07/2017 1004   CL 102 03/10/2017 0833   CO2 26 03/10/2017 0833   CO2 23 01/07/2017 1004   GLUCOSE 223 (H) 03/10/2017 0833   GLUCOSE 217 (H) 01/07/2017 1004   BUN 23 03/10/2017 0833   BUN 23.3 01/07/2017 1004   CREATININE 1.24 (H) 03/10/2017  0833   CREATININE 1.2 (H) 01/07/2017 1004   CALCIUM 9.4 03/10/2017 0833   CALCIUM 9.4 01/07/2017 1004   PROT 6.3 (L) 03/10/2017 0833   PROT 6.7 01/07/2017 1004   ALBUMIN 3.7 03/10/2017 0833   ALBUMIN 3.9 01/07/2017 1004   AST 19 03/10/2017 0833   AST 20 01/07/2017 1004   ALT 18 03/10/2017 0833   ALT 14 01/07/2017 1004   ALKPHOS 77 03/10/2017 0833   ALKPHOS 70 01/07/2017 1004   BILITOT 0.5 03/10/2017 0833   BILITOT 0.49 01/07/2017 1004   GFRNONAA 40 (L) 03/10/2017 0833   GFRNONAA 47 (L) 05/18/2013 1256   GFRAA 46 (L) 03/10/2017 0833   GFRAA 55 (L) 05/18/2013 1256    No results found for:  SPEP, UPEP  Lab Results  Component Value Date   WBC 5.8 03/10/2017   NEUTROABS 4.7 03/10/2017   HGB 10.4 (L) 03/10/2017   HCT 30.7 (L) 03/10/2017   MCV 90.6 03/10/2017   PLT 216 03/10/2017      Chemistry      Component Value Date/Time   NA 138 03/10/2017 0833   NA 136 01/07/2017 1004   K 4.4 03/10/2017 0833   K 4.5 01/07/2017 1004   CL 102 03/10/2017 0833   CO2 26 03/10/2017 0833   CO2 23 01/07/2017 1004   BUN 23 03/10/2017 0833   BUN 23.3 01/07/2017 1004   CREATININE 1.24 (H) 03/10/2017 0833   CREATININE 1.2 (H) 01/07/2017 1004   GLU 202 (H) 08/09/2014 1000      Component Value Date/Time   CALCIUM 9.4 03/10/2017 0833   CALCIUM 9.4 01/07/2017 1004   ALKPHOS 77 03/10/2017 0833   ALKPHOS 70 01/07/2017 1004   AST 19 03/10/2017 0833   AST 20 01/07/2017 1004   ALT 18 03/10/2017 0833   ALT 14 01/07/2017 1004   BILITOT 0.5 03/10/2017 0833   BILITOT 0.49 01/07/2017 1004       RADIOGRAPHIC STUDIES: I have reviewed the imaging study with the patient I have personally reviewed the radiological images as listed and agreed with the findings in the report. Nm Pet Image Restag (ps) Skull Base To Thigh  Result Date: 03/07/2017 CLINICAL DATA:  Subsequent treatment strategy for marginal zone lymphoma. History of breast cancer. EXAM: NUCLEAR MEDICINE PET SKULL BASE TO THIGH TECHNIQUE:  7.7 mCi F-18 FDG was injected intravenously. Full-ring PET imaging was performed from the skull base to thigh after the radiotracer. CT data was obtained and used for attenuation correction and anatomic localization. FASTING BLOOD GLUCOSE:  Value: 123 mg/dl COMPARISON:  Multiple exams, including 05/27/2016 FINDINGS: NECK No hypermetabolic lymph nodes in the neck. Chronic ethmoid, bilateral maxillary, and right sphenoid sinusitis. CHEST No hypermetabolic mediastinal or hilar nodes. No suspicious pulmonary nodules on the CT data. Right Port-A-Cath tip: Right atrium. Atherosclerotic calcification of the aortic arch and coronary arteries. Mitral valve calcification suspected. ABDOMEN/PELVIS No abnormal hypermetabolic activity within the liver, pancreas, adrenal glands, or spleen. No hypermetabolic lymph nodes in the abdomen or pelvis. Considerable physiologic activity in the colon and in some loops of small bowel. Moderately distended gallbladder. Old granulomatous disease. Aortoiliac atherosclerotic vascular disease. SKELETON No focal hypermetabolic activity to suggest skeletal metastasis. IMPRESSION: 1. No findings of active lymphoma in the chest, abdomen, or pelvis. 2. Other imaging findings of potential clinical significance: Aortic Atherosclerosis (ICD10-I70.0). Coronary atherosclerosis. Chronic paranasal sinusitis. Moderately distended gallbladder, chronic. Electronically Signed   By: Van Clines M.D.   On: 03/07/2017 13:19    ASSESSMENT & PLAN:  Marginal zone lymphoma I have reviewed the PET CT scan with the patient She has no signs of cancer recurrence We will continue maintenance therapy for total of 2 years I do not plan to repeat CT scan in 2 completion of treatment next year   Chronic kidney disease (CKD), stage III (moderate) Her kidney function tests is stable. Continue close monitoring. There is no contraindication to continue treatment for now.   Anemia in neoplastic disease The  patient has intermittent, recurrence of anemia, likely due to anemia of chronic disease due to poorly controlled diabetes and mild chronic kidney disease She is not symptomatic. Observe only for now  Essential hypertension she will continue current medical management. I recommend close follow-up with primary care  doctor for medication adjustment.   Recurrent skin cancer She had recurrent skin cancer from squamous cell carcinoma She has appointment scheduled to see her dermatologist for resection I warned the patient about high risk of skin cancer and melanoma due to her lymphoma diagnosis The patient is educated about the importance of skin protection and avoidance of of excessive sun exposure   No orders of the defined types were placed in this encounter.  All questions were answered. The patient knows to call the clinic with any problems, questions or concerns. No barriers to learning was detected. I spent 20 minutes counseling the patient face to face. The total time spent in the appointment was 25 minutes and more than 50% was on counseling and review of test results     Heath Lark, MD 03/10/2017 11:31 AM

## 2017-03-10 NOTE — Telephone Encounter (Signed)
Patient will receive updated copy of schedule in treatment area. Patient scheduled per 2/11 los. °

## 2017-03-10 NOTE — Assessment & Plan Note (Signed)
Her kidney function tests is stable. Continue close monitoring. There is no contraindication to continue treatment for now.  

## 2017-03-10 NOTE — Assessment & Plan Note (Signed)
The patient has intermittent, recurrence of anemia, likely due to anemia of chronic disease due to poorly controlled diabetes and mild chronic kidney disease She is not symptomatic. Observe only for now 

## 2017-03-10 NOTE — Assessment & Plan Note (Signed)
I have reviewed the PET CT scan with the patient She has no signs of cancer recurrence We will continue maintenance therapy for total of 2 years I do not plan to repeat CT scan in 2 completion of treatment next year

## 2017-04-10 ENCOUNTER — Other Ambulatory Visit (HOSPITAL_BASED_OUTPATIENT_CLINIC_OR_DEPARTMENT_OTHER): Payer: Self-pay | Admitting: Family Medicine

## 2017-04-10 DIAGNOSIS — Z1231 Encounter for screening mammogram for malignant neoplasm of breast: Secondary | ICD-10-CM

## 2017-04-16 ENCOUNTER — Ambulatory Visit (HOSPITAL_BASED_OUTPATIENT_CLINIC_OR_DEPARTMENT_OTHER)
Admission: RE | Admit: 2017-04-16 | Discharge: 2017-04-16 | Disposition: A | Discharge status: Home / Self Care | Payer: Medicare Other | Source: Ambulatory Visit | Attending: Family Medicine | Admitting: Family Medicine

## 2017-04-16 DIAGNOSIS — Z1231 Encounter for screening mammogram for malignant neoplasm of breast: Secondary | ICD-10-CM | POA: Insufficient documentation

## 2017-04-22 ENCOUNTER — Encounter: Payer: Self-pay | Admitting: Podiatry

## 2017-04-22 ENCOUNTER — Ambulatory Visit: Payer: Medicare Other | Admitting: Podiatry

## 2017-04-22 DIAGNOSIS — E119 Type 2 diabetes mellitus without complications: Secondary | ICD-10-CM

## 2017-04-22 DIAGNOSIS — M79674 Pain in right toe(s): Secondary | ICD-10-CM | POA: Diagnosis not present

## 2017-04-22 DIAGNOSIS — B351 Tinea unguium: Secondary | ICD-10-CM | POA: Diagnosis not present

## 2017-04-22 DIAGNOSIS — M79675 Pain in left toe(s): Secondary | ICD-10-CM | POA: Diagnosis not present

## 2017-04-22 NOTE — Progress Notes (Signed)
Complaint:  Visit Type: Patient returns to my office for continued preventative foot care services. Complaint: Patient states" my nails have grown long and thick and become painful to walk and wear shoes" Patient has been diagnosed with DM with no foot complications. The patient presents for preventative foot care services. No changes to ROS  Podiatric Exam: Vascular: dorsalis pedis and posterior tibial pulses are palpable bilateral. Capillary return is immediate. Temperature gradient is WNL. Skin turgor WNL  Sensorium: Normal Semmes Weinstein monofilament test. Normal tactile sensation bilaterally. Nail Exam: Pt has thick disfigured discolored nails with subungual debris noted bilateral entire nail hallux through fifth toenails Ulcer Exam: There is no evidence of ulcer or pre-ulcerative changes or infection. Orthopedic Exam: Muscle tone and strength are WNL. No limitations in general ROM. No crepitus or effusions noted. Foot type and digits show no abnormalities. Bony prominences are unremarkable. Skin: No Porokeratosis. No infection or ulcers  Diagnosis:  Onychomycosis, , Pain in right toe, pain in left toes  Treatment & Plan Procedures and Treatment: Consent by patient was obtained for treatment procedures.   Debridement of mycotic and hypertrophic toenails, 1 through 5 bilateral and clearing of subungual debris. No ulceration, no infection noted.  Return Visit-Office Procedure: Patient instructed to return to the office for a follow up visit 3 months for continued evaluation and treatment.    Charese Abundis DPM 

## 2017-05-09 ENCOUNTER — Inpatient Hospital Stay (HOSPITAL_BASED_OUTPATIENT_CLINIC_OR_DEPARTMENT_OTHER): Payer: Medicare Other | Admitting: Hematology and Oncology

## 2017-05-09 ENCOUNTER — Encounter: Payer: Self-pay | Admitting: Hematology and Oncology

## 2017-05-09 ENCOUNTER — Telehealth: Payer: Self-pay | Admitting: Hematology and Oncology

## 2017-05-09 ENCOUNTER — Inpatient Hospital Stay: Payer: Medicare Other

## 2017-05-09 ENCOUNTER — Inpatient Hospital Stay: Payer: Medicare Other | Attending: Hematology and Oncology

## 2017-05-09 DIAGNOSIS — C8588 Other specified types of non-Hodgkin lymphoma, lymph nodes of multiple sites: Secondary | ICD-10-CM

## 2017-05-09 DIAGNOSIS — C44709 Unspecified malignant neoplasm of skin of left lower limb, including hip: Secondary | ICD-10-CM

## 2017-05-09 DIAGNOSIS — I10 Essential (primary) hypertension: Secondary | ICD-10-CM | POA: Diagnosis not present

## 2017-05-09 DIAGNOSIS — Z95828 Presence of other vascular implants and grafts: Secondary | ICD-10-CM

## 2017-05-09 DIAGNOSIS — N183 Chronic kidney disease, stage 3 unspecified: Secondary | ICD-10-CM

## 2017-05-09 DIAGNOSIS — C44609 Unspecified malignant neoplasm of skin of left upper limb, including shoulder: Secondary | ICD-10-CM | POA: Diagnosis not present

## 2017-05-09 DIAGNOSIS — C8580 Other specified types of non-Hodgkin lymphoma, unspecified site: Secondary | ICD-10-CM

## 2017-05-09 DIAGNOSIS — C4499 Other specified malignant neoplasm of skin, unspecified: Secondary | ICD-10-CM

## 2017-05-09 DIAGNOSIS — Z5112 Encounter for antineoplastic immunotherapy: Secondary | ICD-10-CM | POA: Insufficient documentation

## 2017-05-09 LAB — CBC WITH DIFFERENTIAL/PLATELET
BASOS ABS: 0 10*3/uL (ref 0.0–0.1)
BASOS PCT: 1 %
Eosinophils Absolute: 0.1 10*3/uL (ref 0.0–0.5)
Eosinophils Relative: 1 %
HEMATOCRIT: 31.4 % — AB (ref 34.8–46.6)
HEMOGLOBIN: 10.6 g/dL — AB (ref 11.6–15.9)
LYMPHS PCT: 13 %
Lymphs Abs: 0.9 10*3/uL (ref 0.9–3.3)
MCH: 30.6 pg (ref 25.1–34.0)
MCHC: 33.8 g/dL (ref 31.5–36.0)
MCV: 90.8 fL (ref 79.5–101.0)
MONOS PCT: 8 %
Monocytes Absolute: 0.5 10*3/uL (ref 0.1–0.9)
NEUTROS ABS: 5.1 10*3/uL (ref 1.5–6.5)
Neutrophils Relative %: 77 %
Platelets: 225 10*3/uL (ref 145–400)
RBC: 3.46 MIL/uL — ABNORMAL LOW (ref 3.70–5.45)
RDW: 13.3 % (ref 11.2–14.5)
WBC: 6.6 10*3/uL (ref 3.9–10.3)

## 2017-05-09 LAB — COMPREHENSIVE METABOLIC PANEL
ALBUMIN: 3.7 g/dL (ref 3.5–5.0)
ALK PHOS: 70 U/L (ref 40–150)
ALT: 13 U/L (ref 0–55)
AST: 15 U/L (ref 5–34)
Anion gap: 7 (ref 3–11)
BUN: 23 mg/dL (ref 7–26)
CHLORIDE: 104 mmol/L (ref 98–109)
CO2: 26 mmol/L (ref 22–29)
CREATININE: 1.19 mg/dL — AB (ref 0.60–1.10)
Calcium: 9.5 mg/dL (ref 8.4–10.4)
GFR calc Af Amer: 48 mL/min — ABNORMAL LOW (ref >60)
GFR calc non Af Amer: 42 mL/min — ABNORMAL LOW (ref >60)
GLUCOSE: 276 mg/dL — AB (ref 70–140)
Potassium: 4.4 mmol/L (ref 3.5–5.1)
SODIUM: 137 mmol/L (ref 136–145)
Total Bilirubin: 0.4 mg/dL (ref 0.2–1.2)
Total Protein: 6.3 g/dL — ABNORMAL LOW (ref 6.4–8.3)

## 2017-05-09 MED ORDER — DEXAMETHASONE SODIUM PHOSPHATE 10 MG/ML IJ SOLN
INTRAMUSCULAR | Status: AC
  Filled 2017-05-09: qty 1

## 2017-05-09 MED ORDER — HEPARIN SOD (PORK) LOCK FLUSH 100 UNIT/ML IV SOLN
500.0000 [IU] | Freq: Once | INTRAVENOUS | Status: AC | PRN
  Administered 2017-05-09: 500 [IU]
  Filled 2017-05-09: qty 5

## 2017-05-09 MED ORDER — SODIUM CHLORIDE 0.9% FLUSH
10.0000 mL | INTRAVENOUS | Status: DC | PRN
  Administered 2017-05-09: 10 mL
  Filled 2017-05-09: qty 10

## 2017-05-09 MED ORDER — SODIUM CHLORIDE 0.9% FLUSH
10.0000 mL | Freq: Once | INTRAVENOUS | Status: AC
  Administered 2017-05-09: 10 mL
  Filled 2017-05-09: qty 10

## 2017-05-09 MED ORDER — SODIUM CHLORIDE 0.9 % IV SOLN
1000.0000 mg | Freq: Once | INTRAVENOUS | Status: AC
  Administered 2017-05-09: 1000 mg via INTRAVENOUS
  Filled 2017-05-09: qty 40

## 2017-05-09 MED ORDER — SODIUM CHLORIDE 0.9 % IV SOLN
INTRAVENOUS | Status: DC
  Administered 2017-05-09: 10:00:00 via INTRAVENOUS

## 2017-05-09 MED ORDER — ACETAMINOPHEN 325 MG PO TABS
ORAL_TABLET | ORAL | Status: AC
  Filled 2017-05-09: qty 2

## 2017-05-09 MED ORDER — DEXAMETHASONE SODIUM PHOSPHATE 10 MG/ML IJ SOLN
10.0000 mg | Freq: Once | INTRAMUSCULAR | Status: AC
Start: 2017-05-09 — End: 2017-05-09
  Administered 2017-05-09: 10 mg via INTRAVENOUS

## 2017-05-09 MED ORDER — DIPHENHYDRAMINE HCL 50 MG/ML IJ SOLN
50.0000 mg | Freq: Once | INTRAMUSCULAR | Status: AC
  Administered 2017-05-09: 50 mg via INTRAVENOUS

## 2017-05-09 MED ORDER — DIPHENHYDRAMINE HCL 50 MG/ML IJ SOLN
INTRAMUSCULAR | Status: AC
  Filled 2017-05-09: qty 1

## 2017-05-09 MED ORDER — ACETAMINOPHEN 325 MG PO TABS
650.0000 mg | ORAL_TABLET | Freq: Once | ORAL | Status: AC
  Administered 2017-05-09: 650 mg via ORAL

## 2017-05-09 NOTE — Assessment & Plan Note (Signed)
she will continue current medical management. I recommend close follow-up with primary care doctor for medication adjustment.  

## 2017-05-09 NOTE — Assessment & Plan Note (Signed)
Her kidney function tests is stable. Continue close monitoring. There is no contraindication to continue treatment for now.  

## 2017-05-09 NOTE — Patient Instructions (Signed)
[facility] Discharge Instructions for Patients Receiving Chemotherapy  Today you received the following chemotherapy agents: Gazyva   To help prevent nausea and vomiting after your treatment, we encourage you to take your nausea medication as directed.    If you develop nausea and vomiting that is not controlled by your nausea medication, call the clinic.   BELOW ARE SYMPTOMS THAT SHOULD BE REPORTED IMMEDIATELY:  *FEVER GREATER THAN 100.5 F  *CHILLS WITH OR WITHOUT FEVER  NAUSEA AND VOMITING THAT IS NOT CONTROLLED WITH YOUR NAUSEA MEDICATION  *UNUSUAL SHORTNESS OF BREATH  *UNUSUAL BRUISING OR BLEEDING  TENDERNESS IN MOUTH AND THROAT WITH OR WITHOUT PRESENCE OF ULCERS  *URINARY PROBLEMS  *BOWEL PROBLEMS  UNUSUAL RASH Items with * indicate a potential emergency and should be followed up as soon as possible.  Feel free to call the clinic should you have any questions or concerns. The clinic phone number is [phone].  Please show the CHEMO ALERT CARD at check-in to the Emergency Department and triage nurse.   

## 2017-05-09 NOTE — Assessment & Plan Note (Signed)
She has no signs of cancer recurrence on recent PET scan We will continue maintenance therapy for total of 2 years I do not plan to repeat CT scan until completion of treatment next year

## 2017-05-09 NOTE — Telephone Encounter (Signed)
Gave avs and calendar ° °

## 2017-05-09 NOTE — Assessment & Plan Note (Signed)
She had recurrent skin cancer from squamous cell carcinoma I warned the patient about high risk of skin cancer and melanoma due to her lymphoma diagnosis The patient is educated about the importance of skin protection and avoidance of of excessive sun exposure

## 2017-05-09 NOTE — Progress Notes (Signed)
[place] OFFICE PROGRESS NOTE  Patient Care Team: [name], Bill Salinas, MD as PCP - General (Family Medicine) Sharyne Peach, MD as Consulting Physician (Ophthalmology) Inda Castle, MD (Inactive) as Consulting Physician (Gastroenterology) Sypher, Herbie [place], MD (Inactive) as Consulting Physician (Orthopedic Surgery) Jannette Spanner, MD as Referring Physician (Dermatology) Heath Lark, MD as Consulting Physician (Hematology and Oncology) Alphonsa Overall, MD as Consulting Physician (General Surgery)  ASSESSMENT & PLAN:  Marginal zone lymphoma She has no signs of cancer recurrence on recent PET scan We will continue maintenance therapy for total of 2 years I do not plan to repeat CT scan until completion of treatment next year   Recurrent skin cancer She had recurrent skin cancer from squamous cell carcinoma I warned the patient about high risk of skin cancer and melanoma due to her lymphoma diagnosis The patient is educated about the importance of skin protection and avoidance of of excessive sun exposure  Essential hypertension she will continue current medical management. I recommend close follow-up with primary care doctor for medication adjustment.   Chronic kidney disease (CKD), stage III (moderate) Her kidney function tests is stable. Continue close monitoring. There is no contraindication to continue treatment for now.    No orders of the defined types were placed in this encounter.   INTERVAL HISTORY: Please see below for problem oriented charting. She returns with her friend for further follow-up and treatment She has no further recurrence of skin cancer She denies symptomatic headache from hypertension Her blood pressure at home is usually adequate No new lymphadenopathy No recent infection  SUMMARY OF ONCOLOGIC HISTORY: Oncology History   Marginal zone lymphoma, with lymph node and bone marrow involvement along with splenomegaly   Primary  site: Lymphoid Neoplasms   Staging method: AJCC 6th Edition   Clinical: Stage IV signed by Heath Lark, MD on 09/02/2013  7:38 PM   Summary: Stage IV       Marginal zone lymphoma ([place])   07/29/2013 Imaging    CT scan shows splenomegaly and abnormal lymphadenopathy.      08/13/2013 Imaging    PET CT scan confirmed lymphadenopathy and splenomegaly, those areas are PET avid      08/23/2013 Surgery    [id number] left axillary lymph node biopsy confirmed a low-grade lymphoma.      08/27/2013 Bone Marrow Biopsy    Bone marrow biopsy confirmed lymphoma.[id number]      09/08/2013 - 09/29/2013 Chemotherapy    She completed 4 cycles of rituximab with resolution of splenomegaly.      12/29/2013 Imaging    PET scan show complete response.      07/11/2014 Imaging    PET scan showed recurrent disease      07/19/2014 - 08/09/2014 Chemotherapy    She completed 4 cycles of rituximab with resolution of splenomegaly      10/12/2014 Imaging    Repeat PET/CT scan showed near complete response to treatment.      10/13/2014 - 02/09/2015 Chemotherapy    She is placed on rituximab maintenance therapy.      03/29/2015 Imaging    PET CT showed possible mild progression of splenomegaly      04/26/2015 - 02/22/2016 Chemotherapy    She started taking Ibrutinib      04/28/2015 Adverse Reaction    Treatment was placed temporarily on hold due to severe diarrhea and resumed at reduced dose      07/03/2015 PET scan    No hypermetabolic adenopathy in the neck, chest,  abdomen or pelvis.2. Spleen is not hypermetabolic and appears slightly less prominent size.      02/06/2016 PET scan    Hypermetabolic LEFT axillary lymph nodes. Concern for HIGH-GRADE LYMPHOMA LOCALIZED RECURRENCE. 2. Prominent spleen without hypermetabolic activity. No change in size from comparison. 3. No additional hypermetabolic adenopathy on scan.      02/19/2016 Procedure    Technically successful ultrasound-guided core biopsy, left axillary  adenopathy.      02/19/2016 Pathology Results    Lymph node, needle/core biopsy, left axilla - ATYPICAL LYMPHOID PROLIFERATION SUSPICIOUS FOR LYMPHOMA. - SEE COMMENT. Microscopic Comment The sections show multiple very small and skinny fragments of lymph nodal tissue displaying areas of lymphoid expansion by a population of small to medium size lymphocytes with round to irregular nuclei, scanty to moderately abundant cytoplasm, dense chromatin and inconspicuous nucleoli. This is associated with scattering of "naked" germinal centers with abundant tingible body macrophages. To further evaluate this process, flow cytometric analysis was performed and shows a minor population of monoclonal B cells expressing pan B-cell antigens including CD20 with associated kappa light chain restriction. In addition, immunohistochemical stains were performed including CD20, CD79a, CD3, CD5, CD10, BCL-2, CD21 with appropriate controls. The stains show a mixture of T and B cells with slight predominance of B cells. CD21 highlights numerous areas with dendritic networks, correlating with the presence of germinal centers. CD10 shows focal positivity likely in germinal centers. BCL-2 is diffusely positive except in areas of apparent germinal centers. The overall features are atypical and suspicious for involvement by a B-cell lymphoproliferative process, particularly given the B-cell monoclonality by flow cytometry. However, morphologic evaluation is extremely limited due to small biopsy fragments and additional material (excisional biopsy) is strongly recommended for further evaluation. (BNS:ecj 02/21/2016)       03/04/2016 Procedure    Placement of single lumen port a cath via right internal jugular vein. The catheter tip lies at the cavo-atrial junction. A power injectable port a cath was placed and is ready for immediate use.      03/07/2016 - 05/31/2016 Chemotherapy    She received treatment with bendamustine and Gazyva       05/27/2016 PET scan    1. No evidence of active lymphoma on whole-body scan. 2. Resolution of metabolic activity previously identified in the LEFT axilla. 3. Small amount free fluid the pelvis.      03/07/2017 PET scan    1. No findings of active lymphoma in the chest, abdomen, or pelvis. 2. Other imaging findings of potential clinical significance: Aortic Atherosclerosis (ICD10-I70.0). Coronary atherosclerosis. Chronic paranasal sinusitis. Moderately distended gallbladder, chronic       REVIEW OF SYSTEMS:   Constitutional: Denies fevers, chills or abnormal weight loss Eyes: Denies blurriness of vision Ears, nose, mouth, throat, and face: Denies mucositis or sore throat Respiratory: Denies cough, dyspnea or wheezes Cardiovascular: Denies palpitation, chest discomfort or lower extremity swelling Gastrointestinal:  Denies nausea, heartburn or change in bowel habits Skin: Denies abnormal skin rashes Lymphatics: Denies new lymphadenopathy or easy bruising Neurological:Denies numbness, tingling or new weaknesses Behavioral/Psych: Mood is stable, no new changes  All other systems were reviewed with the patient and are negative.  I have reviewed the past medical history, past surgical history, social history and family history with the patient and they are unchanged from previous note.  ALLERGIES:  is allergic to aspirin; capoten [captopril]; levemir [insulin detemir]; micronase [glyburide]; onglyza [saxagliptin]; statins; and zocor [simvastatin].  MEDICATIONS:  Current Outpatient Medications  Medication Sig Dispense  Refill  . acyclovir (ZOVIRAX) 400 MG tablet TAKE 1 TABLET BY MOUTH ONCE DAILY 30 tablet 5  . amLODipine (NORVASC) 10 MG tablet Take 1 tablet (10 mg total) by mouth at bedtime. 90 tablet 3  . aspirin 81 MG tablet Take 40.5 mg by mouth 3 (three) times a week.    . Calcium Carbonate-Vit D-Min (CALCIUM 1200 PO) Take 1 tablet by mouth daily. Per patient takes in am and pm    .  cholecalciferol (VITAMIN D) 1000 UNITS tablet Take 1,000 Units by mouth daily.     . enalapril (VASOTEC) 20 MG tablet Take 20 mg by mouth every morning.    . fish oil-omega-3 fatty acids 1000 MG capsule Take 1 g by mouth daily.     Marland Kitchen glimepiride (AMARYL) 4 MG tablet Take 4 mg by mouth daily with breakfast. Per patient takes in am and pm.    . glucose blood test strip Use as instructed 100 each 12  . lidocaine-prilocaine (EMLA) cream Apply 1 application topically as needed. 30 g 3  . loperamide (IMODIUM) 2 MG capsule Take by mouth as needed for diarrhea or loose stools.    Marland Kitchen LORazepam (ATIVAN) 0.5 MG tablet Take 1 tablet (0.5 mg total) by mouth every 8 (eight) hours as needed for anxiety. 30 tablet 0  . Magnesium 500 MG CAPS Take 500 mg by mouth daily.     . metFORMIN (GLUCOPHAGE) 1000 MG tablet Take 1,000 mg by mouth 2 (two) times daily with a meal.     . Misc Natural Products (LUTEIN 20 PO) Take by mouth daily.    . Multiple Vitamins-Minerals (CENTRUM CARDIO) TABS Take 1 tablet by mouth daily.    . Multiple Vitamins-Minerals (ICAPS) CAPS Take by mouth daily.     No current facility-administered medications for this visit.    Facility-Administered Medications Ordered in Other Visits  Medication Dose Route Frequency Provider Last Rate Last Dose  . 0.9 %  sodium chloride infusion   Intravenous Continuous Alvy Bimler, Ni, MD 20 mL/hr at 05/09/17 0945    . heparin lock flush 100 unit/mL  500 Units Intracatheter Once PRN Alvy Bimler, Ni, MD      . sodium chloride flush (NS) 0.9 % injection 10 mL  10 mL Intracatheter PRN Alvy Bimler, Ni, MD        PHYSICAL EXAMINATION: ECOG PERFORMANCE STATUS: 1 - Symptomatic but completely ambulatory  Vitals:   05/09/17 0901 05/09/17 0903  BP: (!) 165/59 (!) 156/67  Pulse: 93   Resp: 16   Temp: 98.2 F (36.8 C)   SpO2: 98%    Filed Weights   05/09/17 0901  Weight: 158 lb 6.4 oz (71.8 kg)    GENERAL:alert, no distress and comfortable SKIN: skin color, texture,  turgor are normal, no rashes or significant lesions EYES: normal, Conjunctiva are pink and non-injected, sclera clear OROPHARYNX:no exudate, no erythema and lips, buccal mucosa, and tongue normal  NECK: supple, thyroid normal size, non-tender, without nodularity LYMPH:  no palpable lymphadenopathy in the cervical, axillary or inguinal LUNGS: clear to auscultation and percussion with normal breathing effort HEART: regular rate & rhythm and no murmurs and no lower extremity edema ABDOMEN:abdomen soft, non-tender and normal bowel sounds Musculoskeletal:no cyanosis of digits and no clubbing  NEURO: alert & oriented x 3 with fluent speech, no focal motor/sensory deficits  LABORATORY DATA:  I have reviewed the data as listed    Component Value Date/Time   NA 137 05/09/2017 0845   NA 136 01/07/2017  1004   K 4.4 05/09/2017 0845   K 4.5 01/07/2017 1004   CL 104 05/09/2017 0845   CO2 26 05/09/2017 0845   CO2 23 01/07/2017 1004   GLUCOSE 276 (H) 05/09/2017 0845   GLUCOSE 217 (H) 01/07/2017 1004   BUN 23 05/09/2017 0845   BUN 23.3 01/07/2017 1004   CREATININE 1.19 (H) 05/09/2017 0845   CREATININE 1.2 (H) 01/07/2017 1004   CALCIUM 9.5 05/09/2017 0845   CALCIUM 9.4 01/07/2017 1004   PROT 6.3 (L) 05/09/2017 0845   PROT 6.7 01/07/2017 1004   ALBUMIN 3.7 05/09/2017 0845   ALBUMIN 3.9 01/07/2017 1004   AST 15 05/09/2017 0845   AST 20 01/07/2017 1004   ALT 13 05/09/2017 0845   ALT 14 01/07/2017 1004   ALKPHOS 70 05/09/2017 0845   ALKPHOS 70 01/07/2017 1004   BILITOT 0.4 05/09/2017 0845   BILITOT 0.49 01/07/2017 1004   GFRNONAA 42 (L) 05/09/2017 0845   GFRNONAA 47 (L) 05/18/2013 1256   GFRAA 48 (L) 05/09/2017 0845   GFRAA 55 (L) 05/18/2013 1256    No results found for: SPEP, UPEP  Lab Results  Component Value Date   WBC 6.6 05/09/2017   NEUTROABS 5.1 05/09/2017   HGB 10.6 (L) 05/09/2017   HCT 31.4 (L) 05/09/2017   MCV 90.8 05/09/2017   PLT 225 05/09/2017      Chemistry       Component Value Date/Time   NA 137 05/09/2017 0845   NA 136 01/07/2017 1004   K 4.4 05/09/2017 0845   K 4.5 01/07/2017 1004   CL 104 05/09/2017 0845   CO2 26 05/09/2017 0845   CO2 23 01/07/2017 1004   BUN 23 05/09/2017 0845   BUN 23.3 01/07/2017 1004   CREATININE 1.19 (H) 05/09/2017 0845   CREATININE 1.2 (H) 01/07/2017 1004   GLU 202 (H) 08/09/2014 1000      Component Value Date/Time   CALCIUM 9.5 05/09/2017 0845   CALCIUM 9.4 01/07/2017 1004   ALKPHOS 70 05/09/2017 0845   ALKPHOS 70 01/07/2017 1004   AST 15 05/09/2017 0845   AST 20 01/07/2017 1004   ALT 13 05/09/2017 0845   ALT 14 01/07/2017 1004   BILITOT 0.4 05/09/2017 0845   BILITOT 0.49 01/07/2017 1004       RADIOGRAPHIC STUDIES: I have personally reviewed the radiological images as listed and agreed with the findings in the report. Mm Screening Breast Tomo Bilateral  Result Date: 04/16/2017 CLINICAL DATA:  Screening. EXAM: DIGITAL SCREENING BILATERAL MAMMOGRAM WITH TOMO AND CAD COMPARISON:  Previous exam(s). ACR Breast Density Category b: There are scattered areas of fibroglandular density. FINDINGS: There are no findings suspicious for malignancy. Images were processed with CAD. IMPRESSION: No mammographic evidence of malignancy. A result letter of this screening mammogram will be mailed directly to the patient. RECOMMENDATION: Screening mammogram in one year. (Code:[id number]-B-01Y) BI-RADS CATEGORY  1: Negative. Electronically Signed   By: Claudie Revering M.D.   On: 04/16/2017 12:13    All questions were answered. The patient knows to call the clinic with any problems, questions or concerns. No barriers to learning was detected.  I spent 15 minutes counseling the patient face to face. The total time spent in the appointment was 20 minutes and more than 50% was on counseling and review of test results  Heath Lark, MD 05/09/2017 2:33 PM

## 2017-06-20 ENCOUNTER — Other Ambulatory Visit: Payer: Self-pay | Admitting: Hematology and Oncology

## 2017-07-01 ENCOUNTER — Encounter: Payer: Self-pay | Admitting: Podiatry

## 2017-07-01 ENCOUNTER — Ambulatory Visit: Payer: Medicare Other | Admitting: Podiatry

## 2017-07-01 DIAGNOSIS — E119 Type 2 diabetes mellitus without complications: Secondary | ICD-10-CM

## 2017-07-01 DIAGNOSIS — M79675 Pain in left toe(s): Secondary | ICD-10-CM | POA: Diagnosis not present

## 2017-07-01 DIAGNOSIS — B351 Tinea unguium: Secondary | ICD-10-CM | POA: Diagnosis not present

## 2017-07-01 DIAGNOSIS — M79674 Pain in right toe(s): Secondary | ICD-10-CM | POA: Diagnosis not present

## 2017-07-01 NOTE — Progress Notes (Signed)
Complaint:  Visit Type: Patient returns to my office for continued preventative foot care services. Complaint: Patient states" my nails have grown long and thick and become painful to walk and wear shoes" Patient has been diagnosed with DM with no foot complications. The patient presents for preventative foot care services. No changes to ROS  Podiatric Exam: Vascular: dorsalis pedis and posterior tibial pulses are palpable bilateral. Capillary return is immediate. Temperature gradient is WNL. Skin turgor WNL  Sensorium: Normal Semmes Weinstein monofilament test. Normal tactile sensation bilaterally. Nail Exam: Pt has thick disfigured discolored nails with subungual debris noted bilateral entire nail hallux through fifth toenails Ulcer Exam: There is no evidence of ulcer or pre-ulcerative changes or infection. Orthopedic Exam: Muscle tone and strength are WNL. No limitations in general ROM. No crepitus or effusions noted. Foot type and digits show no abnormalities. Bony prominences are unremarkable. Skin: No Porokeratosis. No infection or ulcers  Diagnosis:  Onychomycosis, , Pain in right toe, pain in left toes  Treatment & Plan Procedures and Treatment: Consent by patient was obtained for treatment procedures.   Debridement of mycotic and hypertrophic toenails, 1 through 5 bilateral and clearing of subungual debris. No ulceration, no infection noted.  Return Visit-Office Procedure: Patient instructed to return to the office for a follow up visit 10 weeks  for continued evaluation and treatment.    Dezeray Puccio DPM 

## 2017-07-04 ENCOUNTER — Other Ambulatory Visit: Payer: Self-pay | Admitting: Hematology and Oncology

## 2017-07-04 DIAGNOSIS — C8580 Other specified types of non-Hodgkin lymphoma, unspecified site: Secondary | ICD-10-CM

## 2017-07-08 ENCOUNTER — Inpatient Hospital Stay: Payer: Medicare Other | Admitting: Hematology and Oncology

## 2017-07-08 ENCOUNTER — Inpatient Hospital Stay: Payer: Medicare Other | Attending: Hematology and Oncology

## 2017-07-08 ENCOUNTER — Inpatient Hospital Stay: Payer: Medicare Other

## 2017-07-08 ENCOUNTER — Encounter: Payer: Self-pay | Admitting: Hematology and Oncology

## 2017-07-08 VITALS — BP 137/55 | HR 96 | Temp 99.1°F | Resp 17

## 2017-07-08 DIAGNOSIS — Z95828 Presence of other vascular implants and grafts: Secondary | ICD-10-CM

## 2017-07-08 DIAGNOSIS — C8580 Other specified types of non-Hodgkin lymphoma, unspecified site: Secondary | ICD-10-CM

## 2017-07-08 DIAGNOSIS — I10 Essential (primary) hypertension: Secondary | ICD-10-CM | POA: Diagnosis not present

## 2017-07-08 DIAGNOSIS — D630 Anemia in neoplastic disease: Secondary | ICD-10-CM | POA: Diagnosis not present

## 2017-07-08 DIAGNOSIS — C8588 Other specified types of non-Hodgkin lymphoma, lymph nodes of multiple sites: Secondary | ICD-10-CM | POA: Diagnosis present

## 2017-07-08 DIAGNOSIS — Z5112 Encounter for antineoplastic immunotherapy: Secondary | ICD-10-CM | POA: Insufficient documentation

## 2017-07-08 LAB — CBC WITH DIFFERENTIAL/PLATELET
BASOS ABS: 0 10*3/uL (ref 0.0–0.1)
BASOS PCT: 0 %
EOS PCT: 1 %
Eosinophils Absolute: 0.1 10*3/uL (ref 0.0–0.5)
HCT: 30.5 % — ABNORMAL LOW (ref 34.8–46.6)
Hemoglobin: 10.4 g/dL — ABNORMAL LOW (ref 11.6–15.9)
Lymphocytes Relative: 17 %
Lymphs Abs: 1.3 10*3/uL (ref 0.9–3.3)
MCH: 30.5 pg (ref 25.1–34.0)
MCHC: 34.1 g/dL (ref 31.5–36.0)
MCV: 89.4 fL (ref 79.5–101.0)
MONO ABS: 0.5 10*3/uL (ref 0.1–0.9)
Monocytes Relative: 7 %
Neutro Abs: 5.5 10*3/uL (ref 1.5–6.5)
Neutrophils Relative %: 75 %
PLATELETS: 252 10*3/uL (ref 145–400)
RBC: 3.41 MIL/uL — ABNORMAL LOW (ref 3.70–5.45)
RDW: 13 % (ref 11.2–14.5)
WBC: 7.4 10*3/uL (ref 3.9–10.3)

## 2017-07-08 LAB — COMPREHENSIVE METABOLIC PANEL
ALBUMIN: 3.8 g/dL (ref 3.5–5.0)
ALK PHOS: 85 U/L (ref 40–150)
ALT: 13 U/L (ref 0–55)
ANION GAP: 12 — AB (ref 3–11)
AST: 17 U/L (ref 5–34)
BILIRUBIN TOTAL: 0.4 mg/dL (ref 0.2–1.2)
BUN: 25 mg/dL (ref 7–26)
CALCIUM: 10.2 mg/dL (ref 8.4–10.4)
CO2: 25 mmol/L (ref 22–29)
Chloride: 101 mmol/L (ref 98–109)
Creatinine, Ser: 1.36 mg/dL — ABNORMAL HIGH (ref 0.60–1.10)
GFR calc Af Amer: 41 mL/min — ABNORMAL LOW (ref >60)
GFR calc non Af Amer: 35 mL/min — ABNORMAL LOW (ref >60)
GLUCOSE: 284 mg/dL — AB (ref 70–140)
Potassium: 4.4 mmol/L (ref 3.5–5.1)
Sodium: 138 mmol/L (ref 136–145)
TOTAL PROTEIN: 6.7 g/dL (ref 6.4–8.3)

## 2017-07-08 MED ORDER — ACETAMINOPHEN 325 MG PO TABS
650.0000 mg | ORAL_TABLET | Freq: Once | ORAL | Status: AC
  Administered 2017-07-08: 650 mg via ORAL

## 2017-07-08 MED ORDER — HEPARIN SOD (PORK) LOCK FLUSH 100 UNIT/ML IV SOLN
500.0000 [IU] | Freq: Once | INTRAVENOUS | Status: AC | PRN
  Administered 2017-07-08: 500 [IU]
  Filled 2017-07-08: qty 5

## 2017-07-08 MED ORDER — SODIUM CHLORIDE 0.9 % IV SOLN
1000.0000 mg | Freq: Once | INTRAVENOUS | Status: AC
  Administered 2017-07-08: 1000 mg via INTRAVENOUS
  Filled 2017-07-08: qty 40

## 2017-07-08 MED ORDER — SODIUM CHLORIDE 0.9% FLUSH
10.0000 mL | INTRAVENOUS | Status: DC | PRN
  Administered 2017-07-08: 10 mL
  Filled 2017-07-08: qty 10

## 2017-07-08 MED ORDER — SODIUM CHLORIDE 0.9% FLUSH
10.0000 mL | Freq: Once | INTRAVENOUS | Status: AC
  Administered 2017-07-08: 10 mL
  Filled 2017-07-08: qty 10

## 2017-07-08 MED ORDER — DIPHENHYDRAMINE HCL 50 MG/ML IJ SOLN
50.0000 mg | Freq: Once | INTRAMUSCULAR | Status: AC
  Administered 2017-07-08: 50 mg via INTRAVENOUS

## 2017-07-08 MED ORDER — DEXAMETHASONE SODIUM PHOSPHATE 10 MG/ML IJ SOLN
10.0000 mg | Freq: Once | INTRAMUSCULAR | Status: AC
  Administered 2017-07-08: 10 mg via INTRAVENOUS

## 2017-07-08 NOTE — Progress Notes (Signed)
[facility] PROGRESS NOTE  Patient Care Team: [name], Bill Salinas, MD as PCP - General (Family Medicine) Sharyne Peach, MD as Consulting Physician (Ophthalmology) Inda Castle, MD (Inactive) as Consulting Physician (Gastroenterology) Sypher, Herbie [place], MD (Inactive) as Consulting Physician (Orthopedic Surgery) Jannette Spanner, MD as Referring Physician (Dermatology) Heath Lark, MD as Consulting Physician (Hematology and Oncology) Alphonsa Overall, MD as Consulting Physician (General Surgery)  ASSESSMENT & PLAN:  Marginal zone lymphoma She has no signs of cancer recurrence on recent PET scan in February 2019 We will continue maintenance therapy for total of 2 years I do not plan to repeat CT scan until completion of treatment next year   Anemia in neoplastic disease The patient has intermittent, recurrence of anemia, likely due to anemia of chronic disease due to poorly controlled diabetes and mild chronic kidney disease She is not symptomatic. Observe only for now  Essential hypertension she will continue current medical management. There is an element of whitecoat hypertension I recommend close follow-up with primary care doctor for medication adjustment.    No orders of the defined types were placed in this encounter.   INTERVAL HISTORY: Please see below for problem oriented charting. She returns for further follow-up She feels well No recent infection No new lymphadenopathy She denies infusion reaction She denies headache or blurriness of vision due to hypertension  SUMMARY OF ONCOLOGIC HISTORY: Oncology History   Marginal zone lymphoma, with lymph node and bone marrow involvement along with splenomegaly   Primary site: Lymphoid Neoplasms   Staging method: AJCC 6th Edition   Clinical: Stage IV signed by Heath Lark, MD on 09/02/2013  7:38 PM   Summary: Stage IV       Marginal zone lymphoma ([place])   07/29/2013 Imaging    CT scan shows  splenomegaly and abnormal lymphadenopathy.      08/13/2013 Imaging    PET CT scan confirmed lymphadenopathy and splenomegaly, those areas are PET avid      08/23/2013 Surgery    [id number] left axillary lymph node biopsy confirmed a low-grade lymphoma.      08/27/2013 Bone Marrow Biopsy    Bone marrow biopsy confirmed lymphoma.[id number]      09/08/2013 - 09/29/2013 Chemotherapy    She completed 4 cycles of rituximab with resolution of splenomegaly.      12/29/2013 Imaging    PET scan show complete response.      07/11/2014 Imaging    PET scan showed recurrent disease      07/19/2014 - 08/09/2014 Chemotherapy    She completed 4 cycles of rituximab with resolution of splenomegaly      10/12/2014 Imaging    Repeat PET/CT scan showed near complete response to treatment.      10/13/2014 - 02/09/2015 Chemotherapy    She is placed on rituximab maintenance therapy.      03/29/2015 Imaging    PET CT showed possible mild progression of splenomegaly      04/26/2015 - 02/22/2016 Chemotherapy    She started taking Ibrutinib      04/28/2015 Adverse Reaction    Treatment was placed temporarily on hold due to severe diarrhea and resumed at reduced dose      07/03/2015 PET scan    No hypermetabolic adenopathy in the neck, chest, abdomen or pelvis.2. Spleen is not hypermetabolic and appears slightly less prominent size.      02/06/2016 PET scan    Hypermetabolic LEFT axillary lymph nodes. Concern for HIGH-GRADE LYMPHOMA LOCALIZED RECURRENCE. 2. Prominent  spleen without hypermetabolic activity. No change in size from comparison. 3. No additional hypermetabolic adenopathy on scan.      02/19/2016 Procedure    Technically successful ultrasound-guided core biopsy, left axillary adenopathy.      02/19/2016 Pathology Results    Lymph node, needle/core biopsy, left axilla - ATYPICAL LYMPHOID PROLIFERATION SUSPICIOUS FOR LYMPHOMA. - SEE COMMENT. Microscopic Comment The sections show multiple very  small and skinny fragments of lymph nodal tissue displaying areas of lymphoid expansion by a population of small to medium size lymphocytes with round to irregular nuclei, scanty to moderately abundant cytoplasm, dense chromatin and inconspicuous nucleoli. This is associated with scattering of "naked" germinal centers with abundant tingible body macrophages. To further evaluate this process, flow cytometric analysis was performed and shows a minor population of monoclonal B cells expressing pan B-cell antigens including CD20 with associated kappa light chain restriction. In addition, immunohistochemical stains were performed including CD20, CD79a, CD3, CD5, CD10, BCL-2, CD21 with appropriate controls. The stains show a mixture of T and B cells with slight predominance of B cells. CD21 highlights numerous areas with dendritic networks, correlating with the presence of germinal centers. CD10 shows focal positivity likely in germinal centers. BCL-2 is diffusely positive except in areas of apparent germinal centers. The overall features are atypical and suspicious for involvement by a B-cell lymphoproliferative process, particularly given the B-cell monoclonality by flow cytometry. However, morphologic evaluation is extremely limited due to small biopsy fragments and additional material (excisional biopsy) is strongly recommended for further evaluation. (BNS:ecj 02/21/2016)       03/04/2016 Procedure    Placement of single lumen port a cath via right internal jugular vein. The catheter tip lies at the cavo-atrial junction. A power injectable port a cath was placed and is ready for immediate use.      03/07/2016 - 05/31/2016 Chemotherapy    She received treatment with bendamustine and Gazyva      05/27/2016 PET scan    1. No evidence of active lymphoma on whole-body scan. 2. Resolution of metabolic activity previously identified in the LEFT axilla. 3. Small amount free fluid the pelvis.      03/07/2017 PET scan     1. No findings of active lymphoma in the chest, abdomen, or pelvis. 2. Other imaging findings of potential clinical significance: Aortic Atherosclerosis (ICD10-I70.0). Coronary atherosclerosis. Chronic paranasal sinusitis. Moderately distended gallbladder, chronic       REVIEW OF SYSTEMS:   Constitutional: Denies fevers, chills or abnormal weight loss Eyes: Denies blurriness of vision Ears, nose, mouth, throat, and face: Denies mucositis or sore throat Respiratory: Denies cough, dyspnea or wheezes Cardiovascular: Denies palpitation, chest discomfort or lower extremity swelling Gastrointestinal:  Denies nausea, heartburn or change in bowel habits Skin: Denies abnormal skin rashes Lymphatics: Denies new lymphadenopathy or easy bruising Neurological:Denies numbness, tingling or new weaknesses Behavioral/Psych: Mood is stable, no new changes  All other systems were reviewed with the patient and are negative.  I have reviewed the past medical history, past surgical history, social history and family history with the patient and they are unchanged from previous note.  ALLERGIES:  is allergic to aspirin; capoten [captopril]; levemir [insulin detemir]; micronase [glyburide]; onglyza [saxagliptin]; statins; and zocor [simvastatin].  MEDICATIONS:  Current Outpatient Medications  Medication Sig Dispense Refill  . acyclovir (ZOVIRAX) 400 MG tablet TAKE 1 TABLET BY MOUTH ONCE DAILY 30 tablet 5  . amLODipine (NORVASC) 10 MG tablet Take 1 tablet (10 mg total) by mouth at bedtime. 90 tablet  3  . aspirin 81 MG tablet Take 40.5 mg by mouth 3 (three) times a week.    . Calcium Carbonate-Vit D-Min (CALCIUM 1200 PO) Take 1 tablet by mouth daily. Per patient takes in am and pm    . cholecalciferol (VITAMIN D) 1000 UNITS tablet Take 1,000 Units by mouth daily.     . enalapril (VASOTEC) 20 MG tablet Take 20 mg by mouth every morning.    . fish oil-omega-3 fatty acids 1000 MG capsule Take 1 g by mouth  daily.     Marland Kitchen glimepiride (AMARYL) 4 MG tablet Take 4 mg by mouth daily with breakfast. Per patient takes in am and pm.    . glucose blood test strip Use as instructed 100 each 12  . lidocaine-prilocaine (EMLA) cream Apply 1 application topically as needed. 30 g 3  . loperamide (IMODIUM) 2 MG capsule Take by mouth as needed for diarrhea or loose stools.    Marland Kitchen LORazepam (ATIVAN) 0.5 MG tablet TAKE ONE TABLET BY MOUTH EVERY 8 HOURS AS NEEDED FOR ANXIETY 30 tablet 0  . Magnesium 500 MG CAPS Take 500 mg by mouth daily.     . metFORMIN (GLUCOPHAGE) 1000 MG tablet Take 1,000 mg by mouth 2 (two) times daily with a meal.     . Misc Natural Products (LUTEIN 20 PO) Take by mouth daily.    . Multiple Vitamins-Minerals (CENTRUM CARDIO) TABS Take 1 tablet by mouth daily.    . Multiple Vitamins-Minerals (ICAPS) CAPS Take by mouth daily.     No current facility-administered medications for this visit.     PHYSICAL EXAMINATION: ECOG PERFORMANCE STATUS: 0 - Asymptomatic  Vitals:   07/08/17 0915  BP: (!) 190/95  Pulse: (!) 115  Resp: 18  Temp: 98.6 F (37 C)  SpO2: 98%   Filed Weights   07/08/17 0915  Weight: 162 lb 8 oz (73.7 kg)    GENERAL:alert, no distress and comfortable SKIN: skin color, texture, turgor are normal, no rashes or significant lesions EYES: normal, Conjunctiva are pink and non-injected, sclera clear OROPHARYNX:no exudate, no erythema and lips, buccal mucosa, and tongue normal  NECK: supple, thyroid normal size, non-tender, without nodularity LYMPH:  no palpable lymphadenopathy in the cervical, axillary or inguinal LUNGS: clear to auscultation and percussion with normal breathing effort HEART: regular rate & rhythm and no murmurs and no lower extremity edema ABDOMEN:abdomen soft, non-tender and normal bowel sounds Musculoskeletal:no cyanosis of digits and no clubbing  NEURO: alert & oriented x 3 with fluent speech, no focal motor/sensory deficits  LABORATORY DATA:  I have  reviewed the data as listed    Component Value Date/Time   NA 137 05/09/2017 0845   NA 136 01/07/2017 1004   K 4.4 05/09/2017 0845   K 4.5 01/07/2017 1004   CL 104 05/09/2017 0845   CO2 26 05/09/2017 0845   CO2 23 01/07/2017 1004   GLUCOSE 276 (H) 05/09/2017 0845   GLUCOSE 217 (H) 01/07/2017 1004   BUN 23 05/09/2017 0845   BUN 23.3 01/07/2017 1004   CREATININE 1.19 (H) 05/09/2017 0845   CREATININE 1.2 (H) 01/07/2017 1004   CALCIUM 9.5 05/09/2017 0845   CALCIUM 9.4 01/07/2017 1004   PROT 6.3 (L) 05/09/2017 0845   PROT 6.7 01/07/2017 1004   ALBUMIN 3.7 05/09/2017 0845   ALBUMIN 3.9 01/07/2017 1004   AST 15 05/09/2017 0845   AST 20 01/07/2017 1004   ALT 13 05/09/2017 0845   ALT 14 01/07/2017 1004  ALKPHOS 70 05/09/2017 0845   ALKPHOS 70 01/07/2017 1004   BILITOT 0.4 05/09/2017 0845   BILITOT 0.49 01/07/2017 1004   GFRNONAA 42 (L) 05/09/2017 0845   GFRNONAA 47 (L) 05/18/2013 1256   GFRAA 48 (L) 05/09/2017 0845   GFRAA 55 (L) 05/18/2013 1256    No results found for: SPEP, UPEP  Lab Results  Component Value Date   WBC 7.4 07/08/2017   NEUTROABS 5.5 07/08/2017   HGB 10.4 (L) 07/08/2017   HCT 30.5 (L) 07/08/2017   MCV 89.4 07/08/2017   PLT 252 07/08/2017      Chemistry      Component Value Date/Time   NA 137 05/09/2017 0845   NA 136 01/07/2017 1004   K 4.4 05/09/2017 0845   K 4.5 01/07/2017 1004   CL 104 05/09/2017 0845   CO2 26 05/09/2017 0845   CO2 23 01/07/2017 1004   BUN 23 05/09/2017 0845   BUN 23.3 01/07/2017 1004   CREATININE 1.19 (H) 05/09/2017 0845   CREATININE 1.2 (H) 01/07/2017 1004   GLU 202 (H) 08/09/2014 1000      Component Value Date/Time   CALCIUM 9.5 05/09/2017 0845   CALCIUM 9.4 01/07/2017 1004   ALKPHOS 70 05/09/2017 0845   ALKPHOS 70 01/07/2017 1004   AST 15 05/09/2017 0845   AST 20 01/07/2017 1004   ALT 13 05/09/2017 0845   ALT 14 01/07/2017 1004   BILITOT 0.4 05/09/2017 0845   BILITOT 0.49 01/07/2017 1004      All questions  were answered. The patient knows to call the clinic with any problems, questions or concerns. No barriers to learning was detected.  I spent 15 minutes counseling the patient face to face. The total time spent in the appointment was 20 minutes and more than 50% was on counseling and review of test results  Heath Lark, MD 07/08/2017 9:29 AM

## 2017-07-08 NOTE — Assessment & Plan Note (Signed)
she will continue current medical management. There is an element of whitecoat hypertension I recommend close follow-up with primary care doctor for medication adjustment.

## 2017-07-08 NOTE — Patient Instructions (Signed)
[facility] Discharge Instructions for Patients Receiving Chemotherapy  Today you received the following chemotherapy agents: Gazyva   To help prevent nausea and vomiting after your treatment, we encourage you to take your nausea medication as directed.    If you develop nausea and vomiting that is not controlled by your nausea medication, call the clinic.   BELOW ARE SYMPTOMS THAT SHOULD BE REPORTED IMMEDIATELY:  *FEVER GREATER THAN 100.5 F  *CHILLS WITH OR WITHOUT FEVER  NAUSEA AND VOMITING THAT IS NOT CONTROLLED WITH YOUR NAUSEA MEDICATION  *UNUSUAL SHORTNESS OF BREATH  *UNUSUAL BRUISING OR BLEEDING  TENDERNESS IN MOUTH AND THROAT WITH OR WITHOUT PRESENCE OF ULCERS  *URINARY PROBLEMS  *BOWEL PROBLEMS  UNUSUAL RASH Items with * indicate a potential emergency and should be followed up as soon as possible.  Feel free to call the clinic should you have any questions or concerns. The clinic phone number is [phone].  Please show the CHEMO ALERT CARD at check-in to the Emergency Department and triage nurse.   

## 2017-07-08 NOTE — Assessment & Plan Note (Signed)
The patient has intermittent, recurrence of anemia, likely due to anemia of chronic disease due to poorly controlled diabetes and mild chronic kidney disease She is not symptomatic. Observe only for now 

## 2017-07-08 NOTE — Assessment & Plan Note (Signed)
She has no signs of cancer recurrence on recent PET scan in February 2019 We will continue maintenance therapy for total of 2 years I do not plan to repeat CT scan until completion of treatment next year

## 2017-08-11 ENCOUNTER — Other Ambulatory Visit: Payer: Self-pay | Admitting: Hematology and Oncology

## 2017-08-11 DIAGNOSIS — I10 Essential (primary) hypertension: Secondary | ICD-10-CM

## 2017-09-01 ENCOUNTER — Other Ambulatory Visit: Payer: Self-pay | Admitting: Hematology and Oncology

## 2017-09-01 DIAGNOSIS — C8580 Other specified types of non-Hodgkin lymphoma, unspecified site: Secondary | ICD-10-CM

## 2017-09-02 ENCOUNTER — Ambulatory Visit: Payer: Medicare Other | Admitting: Podiatry

## 2017-09-02 ENCOUNTER — Encounter: Payer: Self-pay | Admitting: Podiatry

## 2017-09-02 DIAGNOSIS — B351 Tinea unguium: Secondary | ICD-10-CM

## 2017-09-02 DIAGNOSIS — M79675 Pain in left toe(s): Secondary | ICD-10-CM | POA: Diagnosis not present

## 2017-09-02 DIAGNOSIS — M79674 Pain in right toe(s): Secondary | ICD-10-CM | POA: Diagnosis not present

## 2017-09-02 DIAGNOSIS — E119 Type 2 diabetes mellitus without complications: Secondary | ICD-10-CM | POA: Diagnosis not present

## 2017-09-02 NOTE — Progress Notes (Signed)
Complaint:  Visit Type: Patient returns to my office for continued preventative foot care services. Complaint: Patient states" my nails have grown long and thick and become painful to walk and wear shoes" Patient has been diagnosed with DM with no foot complications. The patient presents for preventative foot care services. No changes to ROS  Podiatric Exam: Vascular: dorsalis pedis and posterior tibial pulses are palpable bilateral. Capillary return is immediate. Temperature gradient is WNL. Skin turgor WNL  Sensorium: Normal Semmes Weinstein monofilament test. Normal tactile sensation bilaterally. Nail Exam: Pt has thick disfigured discolored nails with subungual debris noted bilateral entire nail hallux through fifth toenails Ulcer Exam: There is no evidence of ulcer or pre-ulcerative changes or infection. Orthopedic Exam: Muscle tone and strength are WNL. No limitations in general ROM. No crepitus or effusions noted. Foot type and digits show no abnormalities. HAV  B/L Skin: No Porokeratosis. No infection or ulcers  Diagnosis:  Onychomycosis, , Pain in right toe, pain in left toes  Treatment & Plan Procedures and Treatment: Consent by patient was obtained for treatment procedures.   Debridement of mycotic and hypertrophic toenails, 1 through 5 bilateral and clearing of subungual debris. No ulceration, no infection noted.  Return Visit-Office Procedure: Patient instructed to return to the office for a follow up visit 10 weeks  for continued evaluation and treatment.    Lorijean Husser DPM 

## 2017-09-05 ENCOUNTER — Inpatient Hospital Stay: Payer: Medicare Other

## 2017-09-05 ENCOUNTER — Encounter: Payer: Self-pay | Admitting: Hematology and Oncology

## 2017-09-05 ENCOUNTER — Telehealth: Payer: Self-pay | Admitting: Hematology and Oncology

## 2017-09-05 ENCOUNTER — Inpatient Hospital Stay: Payer: Medicare Other | Admitting: Hematology and Oncology

## 2017-09-05 ENCOUNTER — Inpatient Hospital Stay: Payer: Medicare Other | Attending: Hematology and Oncology

## 2017-09-05 VITALS — BP 147/68 | HR 90 | Temp 98.7°F | Resp 16

## 2017-09-05 DIAGNOSIS — C8588 Other specified types of non-Hodgkin lymphoma, lymph nodes of multiple sites: Secondary | ICD-10-CM

## 2017-09-05 DIAGNOSIS — N189 Chronic kidney disease, unspecified: Secondary | ICD-10-CM

## 2017-09-05 DIAGNOSIS — C8580 Other specified types of non-Hodgkin lymphoma, unspecified site: Secondary | ICD-10-CM

## 2017-09-05 DIAGNOSIS — Z5112 Encounter for antineoplastic immunotherapy: Secondary | ICD-10-CM | POA: Diagnosis not present

## 2017-09-05 DIAGNOSIS — K521 Toxic gastroenteritis and colitis: Secondary | ICD-10-CM

## 2017-09-05 DIAGNOSIS — D630 Anemia in neoplastic disease: Secondary | ICD-10-CM | POA: Diagnosis not present

## 2017-09-05 DIAGNOSIS — E1165 Type 2 diabetes mellitus with hyperglycemia: Secondary | ICD-10-CM

## 2017-09-05 DIAGNOSIS — Z95828 Presence of other vascular implants and grafts: Secondary | ICD-10-CM

## 2017-09-05 LAB — COMPREHENSIVE METABOLIC PANEL
ALT: 13 U/L (ref 0–44)
AST: 16 U/L (ref 15–41)
Albumin: 3.7 g/dL (ref 3.5–5.0)
Alkaline Phosphatase: 73 U/L (ref 38–126)
Anion gap: 11 (ref 5–15)
BILIRUBIN TOTAL: 0.4 mg/dL (ref 0.3–1.2)
BUN: 22 mg/dL (ref 8–23)
CALCIUM: 9.5 mg/dL (ref 8.9–10.3)
CO2: 24 mmol/L (ref 22–32)
CREATININE: 1.18 mg/dL — AB (ref 0.44–1.00)
Chloride: 102 mmol/L (ref 98–111)
GFR, EST AFRICAN AMERICAN: 48 mL/min — AB (ref >60)
GFR, EST NON AFRICAN AMERICAN: 42 mL/min — AB (ref >60)
Glucose, Bld: 179 mg/dL — ABNORMAL HIGH (ref 70–99)
Potassium: 4.5 mmol/L (ref 3.5–5.1)
Sodium: 137 mmol/L (ref 135–145)
TOTAL PROTEIN: 6.3 g/dL — AB (ref 6.5–8.1)

## 2017-09-05 LAB — CBC WITH DIFFERENTIAL/PLATELET
BASOS ABS: 0.1 10*3/uL (ref 0.0–0.1)
Basophils Relative: 1 %
Eosinophils Absolute: 0.1 10*3/uL (ref 0.0–0.5)
Eosinophils Relative: 1 %
HEMATOCRIT: 30.4 % — AB (ref 34.8–46.6)
Hemoglobin: 10.5 g/dL — ABNORMAL LOW (ref 11.6–15.9)
LYMPHS ABS: 0.9 10*3/uL (ref 0.9–3.3)
LYMPHS PCT: 12 %
MCH: 30.5 pg (ref 25.1–34.0)
MCHC: 34.5 g/dL (ref 31.5–36.0)
MCV: 88.6 fL (ref 79.5–101.0)
MONO ABS: 0.4 10*3/uL (ref 0.1–0.9)
Monocytes Relative: 6 %
NEUTROS ABS: 5.8 10*3/uL (ref 1.5–6.5)
Neutrophils Relative %: 80 %
Platelets: 248 10*3/uL (ref 145–400)
RBC: 3.43 MIL/uL — ABNORMAL LOW (ref 3.70–5.45)
RDW: 13.5 % (ref 11.2–14.5)
WBC: 7.3 10*3/uL (ref 3.9–10.3)

## 2017-09-05 MED ORDER — OBINUTUZUMAB CHEMO INJECTION 1000 MG/40ML
1000.0000 mg | Freq: Once | INTRAVENOUS | Status: AC
  Administered 2017-09-05: 1000 mg via INTRAVENOUS
  Filled 2017-09-05: qty 40

## 2017-09-05 MED ORDER — DIPHENHYDRAMINE HCL 50 MG/ML IJ SOLN
12.5000 mg | Freq: Once | INTRAMUSCULAR | Status: AC
  Administered 2017-09-05: 12.5 mg via INTRAVENOUS

## 2017-09-05 MED ORDER — SODIUM CHLORIDE 0.9% FLUSH
10.0000 mL | Freq: Once | INTRAVENOUS | Status: AC
  Administered 2017-09-05: 10 mL
  Filled 2017-09-05: qty 10

## 2017-09-05 MED ORDER — DEXAMETHASONE SODIUM PHOSPHATE 10 MG/ML IJ SOLN
10.0000 mg | Freq: Once | INTRAMUSCULAR | Status: AC
  Administered 2017-09-05: 10 mg via INTRAVENOUS

## 2017-09-05 MED ORDER — SODIUM CHLORIDE 0.9% FLUSH
10.0000 mL | INTRAVENOUS | Status: DC | PRN
Start: 2017-09-05 — End: 2017-09-05
  Administered 2017-09-05: 10 mL
  Filled 2017-09-05: qty 10

## 2017-09-05 MED ORDER — HEPARIN SOD (PORK) LOCK FLUSH 100 UNIT/ML IV SOLN
500.0000 [IU] | Freq: Once | INTRAVENOUS | Status: AC | PRN
Start: 2017-09-05 — End: 2017-09-05
  Administered 2017-09-05: 500 [IU]
  Filled 2017-09-05: qty 5

## 2017-09-05 MED ORDER — ACETAMINOPHEN 325 MG PO TABS
650.0000 mg | ORAL_TABLET | Freq: Once | ORAL | Status: AC
  Administered 2017-09-05: 650 mg via ORAL

## 2017-09-05 MED ORDER — DEXAMETHASONE SODIUM PHOSPHATE 10 MG/ML IJ SOLN
INTRAMUSCULAR | Status: AC
  Filled 2017-09-05: qty 1

## 2017-09-05 MED ORDER — DIPHENHYDRAMINE HCL 50 MG/ML IJ SOLN
INTRAMUSCULAR | Status: AC
  Filled 2017-09-05: qty 1

## 2017-09-05 MED ORDER — ACETAMINOPHEN 325 MG PO TABS
ORAL_TABLET | ORAL | Status: AC
Start: 2017-09-05 — End: ?
  Filled 2017-09-05: qty 2

## 2017-09-05 MED ORDER — SODIUM CHLORIDE 0.9 % IV SOLN
INTRAVENOUS | Status: DC
  Administered 2017-09-05: 09:00:00 via INTRAVENOUS
  Filled 2017-09-05: qty 250

## 2017-09-05 MED ORDER — ACETAMINOPHEN 325 MG PO TABS
ORAL_TABLET | ORAL | Status: AC
  Filled 2017-09-05: qty 2

## 2017-09-05 NOTE — Assessment & Plan Note (Signed)
She has intermittent diarrhea which I do not believe is related to her current prescription of immunotherapy I recommend Imodium as needed

## 2017-09-05 NOTE — Progress Notes (Signed)
[place] OFFICE PROGRESS NOTE  Patient Care Team: [name], Bill Salinas, MD as PCP - General (Family Medicine) Sharyne Peach, MD as Consulting Physician (Ophthalmology) Inda Castle, MD (Inactive) as Consulting Physician (Gastroenterology) Sypher, Herbie Baltimore, MD (Inactive) as Consulting Physician (Orthopedic Surgery) Jannette Spanner, MD as Referring Physician (Dermatology) Heath Lark, MD as Consulting Physician (Hematology and Oncology) Alphonsa Overall, MD as Consulting Physician (General Surgery)  ASSESSMENT & PLAN:  Marginal zone lymphoma She has no signs of cancer recurrence on recent PET scan in February 2019 We will continue maintenance therapy for total of 2 years I do not plan to repeat CT scan until completion of treatment next year She tolerated treatment well except for side effects of premedication of Benadryl I will reduce her premedication  Diarrhea due to drug She has intermittent diarrhea which I do not believe is related to her current prescription of immunotherapy I recommend Imodium as needed  Anemia in neoplastic disease The patient has intermittent, recurrence of anemia, likely due to anemia of chronic disease due to poorly controlled diabetes and mild chronic kidney disease She is not symptomatic. Observe only for now   No orders of the defined types were placed in this encounter.   INTERVAL HISTORY: Please see below for problem oriented charting. She returns for further therapy She felt uncomfortable after the last dose of treatment, could be attributed to Benadryl She continues to have frequent diarrhea No new lymphadenopathy Denies recent infection, fever or chills  SUMMARY OF ONCOLOGIC HISTORY: Oncology History   Marginal zone lymphoma, with lymph node and bone marrow involvement along with splenomegaly   Primary site: Lymphoid Neoplasms   Staging method: AJCC 6th Edition   Clinical: Stage IV signed by Heath Lark, MD on 09/02/2013   7:38 PM   Summary: Stage IV       Marginal zone lymphoma ([place])   07/29/2013 Imaging    CT scan shows splenomegaly and abnormal lymphadenopathy.    08/13/2013 Imaging    PET CT scan confirmed lymphadenopathy and splenomegaly, those areas are PET avid    08/23/2013 Surgery    [id number] left axillary lymph node biopsy confirmed a low-grade lymphoma.    08/27/2013 Bone Marrow Biopsy    Bone marrow biopsy confirmed lymphoma.[id number]    09/08/2013 - 09/29/2013 Chemotherapy    She completed 4 cycles of rituximab with resolution of splenomegaly.    12/29/2013 Imaging    PET scan show complete response.    07/11/2014 Imaging    PET scan showed recurrent disease    07/19/2014 - 08/09/2014 Chemotherapy    She completed 4 cycles of rituximab with resolution of splenomegaly    10/12/2014 Imaging    Repeat PET/CT scan showed near complete response to treatment.    10/13/2014 - 02/09/2015 Chemotherapy    She is placed on rituximab maintenance therapy.    03/29/2015 Imaging    PET CT showed possible mild progression of splenomegaly    04/26/2015 - 02/22/2016 Chemotherapy    She started taking Ibrutinib    04/28/2015 Adverse Reaction    Treatment was placed temporarily on hold due to severe diarrhea and resumed at reduced dose    07/03/2015 PET scan    No hypermetabolic adenopathy in the neck, chest, abdomen or pelvis.2. Spleen is not hypermetabolic and appears slightly less prominent size.    02/06/2016 PET scan    Hypermetabolic LEFT axillary lymph nodes. Concern for HIGH-GRADE LYMPHOMA LOCALIZED RECURRENCE. 2. Prominent spleen without hypermetabolic activity. No change in  size from comparison. 3. No additional hypermetabolic adenopathy on scan.    02/19/2016 Procedure    Technically successful ultrasound-guided core biopsy, left axillary adenopathy.    02/19/2016 Pathology Results    Lymph node, needle/core biopsy, left axilla - ATYPICAL LYMPHOID PROLIFERATION SUSPICIOUS FOR LYMPHOMA. - SEE  COMMENT. Microscopic Comment The sections show multiple very small and skinny fragments of lymph nodal tissue displaying areas of lymphoid expansion by a population of small to medium size lymphocytes with round to irregular nuclei, scanty to moderately abundant cytoplasm, dense chromatin and inconspicuous nucleoli. This is associated with scattering of "naked" germinal centers with abundant tingible body macrophages. To further evaluate this process, flow cytometric analysis was performed and shows a minor population of monoclonal B cells expressing pan B-cell antigens including CD20 with associated kappa light chain restriction. In addition, immunohistochemical stains were performed including CD20, CD79a, CD3, CD5, CD10, BCL-2, CD21 with appropriate controls. The stains show a mixture of T and B cells with slight predominance of B cells. CD21 highlights numerous areas with dendritic networks, correlating with the presence of germinal centers. CD10 shows focal positivity likely in germinal centers. BCL-2 is diffusely positive except in areas of apparent germinal centers. The overall features are atypical and suspicious for involvement by a B-cell lymphoproliferative process, particularly given the B-cell monoclonality by flow cytometry. However, morphologic evaluation is extremely limited due to small biopsy fragments and additional material (excisional biopsy) is strongly recommended for further evaluation. (BNS:ecj 02/21/2016)     03/04/2016 Procedure    Placement of single lumen port a cath via right internal jugular vein. The catheter tip lies at the cavo-atrial junction. A power injectable port a cath was placed and is ready for immediate use.    03/07/2016 - 05/31/2016 Chemotherapy    She received treatment with bendamustine and Gazyva    05/27/2016 PET scan    1. No evidence of active lymphoma on whole-body scan. 2. Resolution of metabolic activity previously identified in the LEFT axilla. 3. Small  amount free fluid the pelvis.    03/07/2017 PET scan    1. No findings of active lymphoma in the chest, abdomen, or pelvis. 2. Other imaging findings of potential clinical significance: Aortic Atherosclerosis (ICD10-I70.0). Coronary atherosclerosis. Chronic paranasal sinusitis. Moderately distended gallbladder, chronic     REVIEW OF SYSTEMS:   Constitutional: Denies fevers, chills or abnormal weight loss Eyes: Denies blurriness of vision Ears, nose, mouth, throat, and face: Denies mucositis or sore throat Respiratory: Denies cough, dyspnea or wheezes Cardiovascular: Denies palpitation, chest discomfort or lower extremity swelling Gastrointestinal:  Denies nausea, heartburn or change in bowel habits Skin: Denies abnormal skin rashes Lymphatics: Denies new lymphadenopathy or easy bruising Neurological:Denies numbness, tingling or new weaknesses Behavioral/Psych: Mood is stable, no new changes  All other systems were reviewed with the patient and are negative.  I have reviewed the past medical history, past surgical history, social history and family history with the patient and they are unchanged from previous note.  ALLERGIES:  is allergic to aspirin; capoten [captopril]; levemir [insulin detemir]; micronase [glyburide]; onglyza [saxagliptin]; statins; and zocor [simvastatin].  MEDICATIONS:  Current Outpatient Medications  Medication Sig Dispense Refill  . acyclovir (ZOVIRAX) 400 MG tablet TAKE 1 TABLET BY MOUTH ONCE DAILY 30 tablet 5  . amLODipine (NORVASC) 10 MG tablet TAKE 1 TABLET BY MOUTH AT BEDTIME 90 tablet 3  . aspirin 81 MG tablet Take 40.5 mg by mouth 3 (three) times a week.    . Calcium Carbonate-Vit D-Min (  CALCIUM 1200 PO) Take 1 tablet by mouth daily. Per patient takes in am and pm    . cholecalciferol (VITAMIN D) 1000 UNITS tablet Take 1,000 Units by mouth daily.     . enalapril (VASOTEC) 20 MG tablet Take 20 mg by mouth every morning.    . fish oil-omega-3 fatty acids  1000 MG capsule Take 1 g by mouth daily.     Marland Kitchen glimepiride (AMARYL) 4 MG tablet Take 4 mg by mouth daily with breakfast. Per patient takes in am and pm.    . glucose blood test strip Use as instructed 100 each 12  . lidocaine-prilocaine (EMLA) cream Apply 1 application topically as needed. 30 g 3  . loperamide (IMODIUM) 2 MG capsule Take by mouth as needed for diarrhea or loose stools.    Marland Kitchen LORazepam (ATIVAN) 0.5 MG tablet TAKE ONE TABLET BY MOUTH EVERY 8 HOURS AS NEEDED FOR ANXIETY 30 tablet 0  . Magnesium 500 MG CAPS Take 500 mg by mouth daily.     . metFORMIN (GLUCOPHAGE) 1000 MG tablet Take 1,000 mg by mouth 2 (two) times daily with a meal.     . Misc Natural Products (LUTEIN 20 PO) Take by mouth daily.    . Multiple Vitamins-Minerals (CENTRUM CARDIO) TABS Take 1 tablet by mouth daily.    . Multiple Vitamins-Minerals (ICAPS) CAPS Take by mouth daily.     No current facility-administered medications for this visit.    Facility-Administered Medications Ordered in Other Visits  Medication Dose Route Frequency Provider Last Rate Last Dose  . 0.9 %  sodium chloride infusion   Intravenous Continuous Alvy Bimler, Miron Marxen, MD 20 mL/hr at 09/05/17 0916    . heparin lock flush 100 unit/mL  500 Units Intracatheter Once PRN Alvy Bimler, Noma Quijas, MD      . sodium chloride flush (NS) 0.9 % injection 10 mL  10 mL Intracatheter PRN Alvy Bimler, Amyriah Buras, MD        PHYSICAL EXAMINATION: ECOG PERFORMANCE STATUS: 1 - Symptomatic but completely ambulatory  Vitals:   09/05/17 0836  BP: 122/82  Pulse: 87  Resp: 18  Temp: 98.7 F (37.1 C)  SpO2: 100%   Filed Weights   09/05/17 0836  Weight: 162 lb (73.5 kg)    GENERAL:alert, no distress and comfortable SKIN: skin color, texture, turgor are normal, no rashes or significant lesions EYES: normal, Conjunctiva are pink and non-injected, sclera clear OROPHARYNX:no exudate, no erythema and lips, buccal mucosa, and tongue normal  NECK: supple, thyroid normal size, non-tender,  without nodularity LYMPH:  no palpable lymphadenopathy in the cervical, axillary or inguinal LUNGS: clear to auscultation and percussion with normal breathing effort HEART: regular rate & rhythm and no murmurs and no lower extremity edema ABDOMEN:abdomen soft, non-tender and normal bowel sounds Musculoskeletal:no cyanosis of digits and no clubbing  NEURO: alert & oriented x 3 with fluent speech, no focal motor/sensory deficits  LABORATORY DATA:  I have reviewed the data as listed    Component Value Date/Time   NA 137 09/05/2017 0755   NA 136 01/07/2017 1004   K 4.5 09/05/2017 0755   K 4.5 01/07/2017 1004   CL 102 09/05/2017 0755   CO2 24 09/05/2017 0755   CO2 23 01/07/2017 1004   GLUCOSE 179 (H) 09/05/2017 0755   GLUCOSE 217 (H) 01/07/2017 1004   BUN 22 09/05/2017 0755   BUN 23.3 01/07/2017 1004   CREATININE 1.18 (H) 09/05/2017 0755   CREATININE 1.2 (H) 01/07/2017 1004   CALCIUM 9.5 09/05/2017  0755   CALCIUM 9.4 01/07/2017 1004   PROT 6.3 (L) 09/05/2017 0755   PROT 6.7 01/07/2017 1004   ALBUMIN 3.7 09/05/2017 0755   ALBUMIN 3.9 01/07/2017 1004   AST 16 09/05/2017 0755   AST 20 01/07/2017 1004   ALT 13 09/05/2017 0755   ALT 14 01/07/2017 1004   ALKPHOS 73 09/05/2017 0755   ALKPHOS 70 01/07/2017 1004   BILITOT 0.4 09/05/2017 0755   BILITOT 0.49 01/07/2017 1004   GFRNONAA 42 (L) 09/05/2017 0755   GFRNONAA 47 (L) 05/18/2013 1256   GFRAA 48 (L) 09/05/2017 0755   GFRAA 55 (L) 05/18/2013 1256    No results found for: SPEP, UPEP  Lab Results  Component Value Date   WBC 7.3 09/05/2017   NEUTROABS 5.8 09/05/2017   HGB 10.5 (L) 09/05/2017   HCT 30.4 (L) 09/05/2017   MCV 88.6 09/05/2017   PLT 248 09/05/2017      Chemistry      Component Value Date/Time   NA 137 09/05/2017 0755   NA 136 01/07/2017 1004   K 4.5 09/05/2017 0755   K 4.5 01/07/2017 1004   CL 102 09/05/2017 0755   CO2 24 09/05/2017 0755   CO2 23 01/07/2017 1004   BUN 22 09/05/2017 0755   BUN 23.3  01/07/2017 1004   CREATININE 1.18 (H) 09/05/2017 0755   CREATININE 1.2 (H) 01/07/2017 1004   GLU 202 (H) 08/09/2014 1000      Component Value Date/Time   CALCIUM 9.5 09/05/2017 0755   CALCIUM 9.4 01/07/2017 1004   ALKPHOS 73 09/05/2017 0755   ALKPHOS 70 01/07/2017 1004   AST 16 09/05/2017 0755   AST 20 01/07/2017 1004   ALT 13 09/05/2017 0755   ALT 14 01/07/2017 1004   BILITOT 0.4 09/05/2017 0755   BILITOT 0.49 01/07/2017 1004       All questions were answered. The patient knows to call the clinic with any problems, questions or concerns. No barriers to learning was detected.  I spent 15 minutes counseling the patient face to face. The total time spent in the appointment was 20 minutes and more than 50% was on counseling and review of test results  Heath Lark, MD 09/05/2017 1:12 PM

## 2017-09-05 NOTE — Assessment & Plan Note (Signed)
The patient has intermittent, recurrence of anemia, likely due to anemia of chronic disease due to poorly controlled diabetes and mild chronic kidney disease She is not symptomatic. Observe only for now 

## 2017-09-05 NOTE — Assessment & Plan Note (Signed)
She has no signs of cancer recurrence on recent PET scan in February 2019 We will continue maintenance therapy for total of 2 years I do not plan to repeat CT scan until completion of treatment next year She tolerated treatment well except for side effects of premedication of Benadryl I will reduce her premedication

## 2017-09-05 NOTE — Telephone Encounter (Signed)
Gave patient avs and calendar of upcoming appts.  °

## 2017-09-05 NOTE — Patient Instructions (Signed)
[facility] Discharge Instructions for Patients Receiving Chemotherapy  Today you received the following chemotherapy agents: Gazyva   To help prevent nausea and vomiting after your treatment, we encourage you to take your nausea medication as directed.    If you develop nausea and vomiting that is not controlled by your nausea medication, call the clinic.   BELOW ARE SYMPTOMS THAT SHOULD BE REPORTED IMMEDIATELY:  *FEVER GREATER THAN 100.5 F  *CHILLS WITH OR WITHOUT FEVER  NAUSEA AND VOMITING THAT IS NOT CONTROLLED WITH YOUR NAUSEA MEDICATION  *UNUSUAL SHORTNESS OF BREATH  *UNUSUAL BRUISING OR BLEEDING  TENDERNESS IN MOUTH AND THROAT WITH OR WITHOUT PRESENCE OF ULCERS  *URINARY PROBLEMS  *BOWEL PROBLEMS  UNUSUAL RASH Items with * indicate a potential emergency and should be followed up as soon as possible.  Feel free to call the clinic should you have any questions or concerns. The clinic phone number is [phone].  Please show the CHEMO ALERT CARD at check-in to the Emergency Department and triage nurse.   

## 2017-11-04 ENCOUNTER — Encounter: Payer: Self-pay | Admitting: Hematology and Oncology

## 2017-11-04 ENCOUNTER — Inpatient Hospital Stay: Payer: Medicare Other

## 2017-11-04 ENCOUNTER — Inpatient Hospital Stay (HOSPITAL_BASED_OUTPATIENT_CLINIC_OR_DEPARTMENT_OTHER): Payer: Medicare Other | Admitting: Hematology and Oncology

## 2017-11-04 ENCOUNTER — Inpatient Hospital Stay: Payer: Medicare Other | Attending: Hematology and Oncology

## 2017-11-04 ENCOUNTER — Telehealth: Payer: Self-pay | Admitting: Hematology and Oncology

## 2017-11-04 VITALS — BP 150/68 | HR 101 | Temp 98.0°F | Resp 20

## 2017-11-04 DIAGNOSIS — N183 Chronic kidney disease, stage 3 unspecified: Secondary | ICD-10-CM

## 2017-11-04 DIAGNOSIS — C8588 Other specified types of non-Hodgkin lymphoma, lymph nodes of multiple sites: Secondary | ICD-10-CM

## 2017-11-04 DIAGNOSIS — Z23 Encounter for immunization: Secondary | ICD-10-CM | POA: Diagnosis not present

## 2017-11-04 DIAGNOSIS — C8580 Other specified types of non-Hodgkin lymphoma, unspecified site: Secondary | ICD-10-CM

## 2017-11-04 DIAGNOSIS — E1121 Type 2 diabetes mellitus with diabetic nephropathy: Secondary | ICD-10-CM | POA: Diagnosis not present

## 2017-11-04 DIAGNOSIS — Z5112 Encounter for antineoplastic immunotherapy: Secondary | ICD-10-CM | POA: Diagnosis present

## 2017-11-04 DIAGNOSIS — Z95828 Presence of other vascular implants and grafts: Secondary | ICD-10-CM

## 2017-11-04 LAB — COMPREHENSIVE METABOLIC PANEL
ALK PHOS: 76 U/L (ref 38–126)
ALT: 14 U/L (ref 0–44)
ANION GAP: 12 (ref 5–15)
AST: 15 U/L (ref 15–41)
Albumin: 3.6 g/dL (ref 3.5–5.0)
BUN: 23 mg/dL (ref 8–23)
CO2: 25 mmol/L (ref 22–32)
Calcium: 9 mg/dL (ref 8.9–10.3)
Chloride: 103 mmol/L (ref 98–111)
Creatinine, Ser: 1.2 mg/dL — ABNORMAL HIGH (ref 0.44–1.00)
GFR calc Af Amer: 47 mL/min — ABNORMAL LOW (ref >60)
GFR calc non Af Amer: 41 mL/min — ABNORMAL LOW (ref >60)
GLUCOSE: 283 mg/dL — AB (ref 70–99)
POTASSIUM: 4.6 mmol/L (ref 3.5–5.1)
Sodium: 140 mmol/L (ref 135–145)
Total Bilirubin: 0.5 mg/dL (ref 0.3–1.2)
Total Protein: 6.2 g/dL — ABNORMAL LOW (ref 6.5–8.1)

## 2017-11-04 LAB — CBC WITH DIFFERENTIAL/PLATELET
BASOS ABS: 0 10*3/uL (ref 0.0–0.1)
Basophils Relative: 1 %
EOS PCT: 1 %
Eosinophils Absolute: 0.1 10*3/uL (ref 0.0–0.5)
HEMATOCRIT: 30.8 % — AB (ref 34.8–46.6)
Hemoglobin: 10.3 g/dL — ABNORMAL LOW (ref 11.6–15.9)
LYMPHS ABS: 0.8 10*3/uL — AB (ref 0.9–3.3)
LYMPHS PCT: 13 %
MCH: 30.3 pg (ref 25.1–34.0)
MCHC: 33.4 g/dL (ref 31.5–36.0)
MCV: 90.6 fL (ref 79.5–101.0)
MONO ABS: 0.5 10*3/uL (ref 0.1–0.9)
Monocytes Relative: 8 %
NEUTROS ABS: 5 10*3/uL (ref 1.5–6.5)
NRBC: 0 % (ref 0.0–0.2)
Neutrophils Relative %: 77 %
PLATELETS: 229 10*3/uL (ref 145–400)
RBC: 3.4 MIL/uL — ABNORMAL LOW (ref 3.70–5.45)
RDW: 13.7 % (ref 11.2–14.5)
WBC: 6.4 10*3/uL (ref 3.9–10.3)

## 2017-11-04 MED ORDER — SODIUM CHLORIDE 0.9 % IV SOLN
1000.0000 mg | Freq: Once | INTRAVENOUS | Status: AC
  Administered 2017-11-04: 1000 mg via INTRAVENOUS
  Filled 2017-11-04: qty 40

## 2017-11-04 MED ORDER — DEXAMETHASONE SODIUM PHOSPHATE 10 MG/ML IJ SOLN
INTRAMUSCULAR | Status: AC
  Filled 2017-11-04: qty 1

## 2017-11-04 MED ORDER — ACETAMINOPHEN 325 MG PO TABS
ORAL_TABLET | ORAL | Status: AC
  Filled 2017-11-04: qty 2

## 2017-11-04 MED ORDER — SODIUM CHLORIDE 0.9 % IV SOLN
INTRAVENOUS | Status: DC
  Administered 2017-11-04: 10:00:00 via INTRAVENOUS
  Filled 2017-11-04: qty 250

## 2017-11-04 MED ORDER — SODIUM CHLORIDE 0.9% FLUSH
10.0000 mL | Freq: Once | INTRAVENOUS | Status: AC
  Administered 2017-11-04: 10 mL
  Filled 2017-11-04: qty 10

## 2017-11-04 MED ORDER — INFLUENZA VAC SPLIT QUAD 0.5 ML IM SUSY
0.5000 mL | PREFILLED_SYRINGE | Freq: Once | INTRAMUSCULAR | Status: AC
  Administered 2017-11-04: 0.5 mL via INTRAMUSCULAR

## 2017-11-04 MED ORDER — DEXAMETHASONE SODIUM PHOSPHATE 10 MG/ML IJ SOLN
10.0000 mg | Freq: Once | INTRAMUSCULAR | Status: AC
  Administered 2017-11-04: 10 mg via INTRAVENOUS

## 2017-11-04 MED ORDER — INFLUENZA VAC SPLIT QUAD 0.5 ML IM SUSY
PREFILLED_SYRINGE | INTRAMUSCULAR | Status: AC
  Filled 2017-11-04: qty 0.5

## 2017-11-04 MED ORDER — DIPHENHYDRAMINE HCL 50 MG/ML IJ SOLN
INTRAMUSCULAR | Status: AC
  Filled 2017-11-04: qty 1

## 2017-11-04 MED ORDER — DIPHENHYDRAMINE HCL 50 MG/ML IJ SOLN
12.5000 mg | Freq: Once | INTRAMUSCULAR | Status: AC
  Administered 2017-11-04: 12.5 mg via INTRAVENOUS

## 2017-11-04 MED ORDER — SODIUM CHLORIDE 0.9% FLUSH
10.0000 mL | INTRAVENOUS | Status: DC | PRN
  Administered 2017-11-04: 10 mL
  Filled 2017-11-04: qty 10

## 2017-11-04 MED ORDER — ACETAMINOPHEN 325 MG PO TABS
650.0000 mg | ORAL_TABLET | Freq: Once | ORAL | Status: AC
  Administered 2017-11-04: 650 mg via ORAL

## 2017-11-04 MED ORDER — HEPARIN SOD (PORK) LOCK FLUSH 100 UNIT/ML IV SOLN
500.0000 [IU] | Freq: Once | INTRAVENOUS | Status: AC | PRN
  Administered 2017-11-04: 500 [IU]
  Filled 2017-11-04: qty 5

## 2017-11-04 NOTE — Assessment & Plan Note (Addendum)
She has no signs of cancer recurrence on recent PET scan in February 2019 We will continue maintenance therapy for total of 2 years I do not plan to repeat CT scan until completion of treatment next year We discussed influenza vaccination today.

## 2017-11-04 NOTE — Telephone Encounter (Signed)
Per 10/8 los, return for no new orders.

## 2017-11-04 NOTE — Assessment & Plan Note (Signed)
She is noted to have elevated blood sugar We discussed importance of lifestyle modification She will continue medication management by her primary care doctor

## 2017-11-04 NOTE — Patient Instructions (Signed)
[facility] Discharge Instructions for Patients Receiving Chemotherapy  Today you received the following chemotherapy agents: Gazyva   To help prevent nausea and vomiting after your treatment, we encourage you to take your nausea medication as directed.    If you develop nausea and vomiting that is not controlled by your nausea medication, call the clinic.   BELOW ARE SYMPTOMS THAT SHOULD BE REPORTED IMMEDIATELY:  *FEVER GREATER THAN 100.5 F  *CHILLS WITH OR WITHOUT FEVER  NAUSEA AND VOMITING THAT IS NOT CONTROLLED WITH YOUR NAUSEA MEDICATION  *UNUSUAL SHORTNESS OF BREATH  *UNUSUAL BRUISING OR BLEEDING  TENDERNESS IN MOUTH AND THROAT WITH OR WITHOUT PRESENCE OF ULCERS  *URINARY PROBLEMS  *BOWEL PROBLEMS  UNUSUAL RASH Items with * indicate a potential emergency and should be followed up as soon as possible.  Feel free to call the clinic should you have any questions or concerns. The clinic phone number is [phone].  Please show the CHEMO ALERT CARD at check-in to the Emergency Department and triage nurse.   

## 2017-11-04 NOTE — Progress Notes (Signed)
[facility] OFFICE PROGRESS NOTE  Patient Care Team: [name], Bill Salinas, MD as PCP - General (Family Medicine) Sharyne Peach, MD as Consulting Physician (Ophthalmology) Inda Castle, MD (Inactive) as Consulting Physician (Gastroenterology) Sypher, Herbie Baltimore, MD (Inactive) as Consulting Physician (Orthopedic Surgery) Jannette Spanner, MD as Referring Physician (Dermatology) Heath Lark, MD as Consulting Physician (Hematology and Oncology) Alphonsa Overall, MD as Consulting Physician (General Surgery)  ASSESSMENT & PLAN:  Marginal zone lymphoma She has no signs of cancer recurrence on recent PET scan in February 2019 We will continue maintenance therapy for total of 2 years I do not plan to repeat CT scan until completion of treatment next year We discussed influenza vaccination today.  Chronic kidney disease (CKD), stage III (moderate) Her kidney function tests is stable. Continue close monitoring. There is no contraindication to continue treatment for now.   Type 2 diabetes mellitus with diabetic nephropathy She is noted to have elevated blood sugar We discussed importance of lifestyle modification She will continue medication management by her primary care doctor   No orders of the defined types were placed in this encounter.   INTERVAL HISTORY: Please see below for problem oriented charting. She returns for further follow-up Denies recent infection, fever or chills No new lymphadenopathy She remains active, exercising as tolerated.  She has not been vaccinated with influenza vaccination yet this year  SUMMARY OF ONCOLOGIC HISTORY: Oncology History   Marginal zone lymphoma, with lymph node and bone marrow involvement along with splenomegaly   Primary site: Lymphoid Neoplasms   Staging method: AJCC 6th Edition   Clinical: Stage IV signed by Heath Lark, MD on 09/02/2013  7:38 PM   Summary: Stage IV       Marginal zone lymphoma ([place])   07/29/2013 Imaging     CT scan shows splenomegaly and abnormal lymphadenopathy.    08/13/2013 Imaging    PET CT scan confirmed lymphadenopathy and splenomegaly, those areas are PET avid    08/23/2013 Surgery    [id number] left axillary lymph node biopsy confirmed a low-grade lymphoma.    08/27/2013 Bone Marrow Biopsy    Bone marrow biopsy confirmed lymphoma.[id number]    09/08/2013 - 09/29/2013 Chemotherapy    She completed 4 cycles of rituximab with resolution of splenomegaly.    12/29/2013 Imaging    PET scan show complete response.    07/11/2014 Imaging    PET scan showed recurrent disease    07/19/2014 - 08/09/2014 Chemotherapy    She completed 4 cycles of rituximab with resolution of splenomegaly    10/12/2014 Imaging    Repeat PET/CT scan showed near complete response to treatment.    10/13/2014 - 02/09/2015 Chemotherapy    She is placed on rituximab maintenance therapy.    03/29/2015 Imaging    PET CT showed possible mild progression of splenomegaly    04/26/2015 - 02/22/2016 Chemotherapy    She started taking Ibrutinib    04/28/2015 Adverse Reaction    Treatment was placed temporarily on hold due to severe diarrhea and resumed at reduced dose    07/03/2015 PET scan    No hypermetabolic adenopathy in the neck, chest, abdomen or pelvis.2. Spleen is not hypermetabolic and appears slightly less prominent size.    02/06/2016 PET scan    Hypermetabolic LEFT axillary lymph nodes. Concern for HIGH-GRADE LYMPHOMA LOCALIZED RECURRENCE. 2. Prominent spleen without hypermetabolic activity. No change in size from comparison. 3. No additional hypermetabolic adenopathy on scan.    02/19/2016 Procedure    Technically  successful ultrasound-guided core biopsy, left axillary adenopathy.    02/19/2016 Pathology Results    Lymph node, needle/core biopsy, left axilla - ATYPICAL LYMPHOID PROLIFERATION SUSPICIOUS FOR LYMPHOMA. - SEE COMMENT. Microscopic Comment The sections show multiple very small and skinny fragments of  lymph nodal tissue displaying areas of lymphoid expansion by a population of small to medium size lymphocytes with round to irregular nuclei, scanty to moderately abundant cytoplasm, dense chromatin and inconspicuous nucleoli. This is associated with scattering of "naked" germinal centers with abundant tingible body macrophages. To further evaluate this process, flow cytometric analysis was performed and shows a minor population of monoclonal B cells expressing pan B-cell antigens including CD20 with associated kappa light chain restriction. In addition, immunohistochemical stains were performed including CD20, CD79a, CD3, CD5, CD10, BCL-2, CD21 with appropriate controls. The stains show a mixture of T and B cells with slight predominance of B cells. CD21 highlights numerous areas with dendritic networks, correlating with the presence of germinal centers. CD10 shows focal positivity likely in germinal centers. BCL-2 is diffusely positive except in areas of apparent germinal centers. The overall features are atypical and suspicious for involvement by a B-cell lymphoproliferative process, particularly given the B-cell monoclonality by flow cytometry. However, morphologic evaluation is extremely limited due to small biopsy fragments and additional material (excisional biopsy) is strongly recommended for further evaluation. (BNS:ecj 02/21/2016)     03/04/2016 Procedure    Placement of single lumen port a cath via right internal jugular vein. The catheter tip lies at the cavo-atrial junction. A power injectable port a cath was placed and is ready for immediate use.    03/07/2016 - 05/31/2016 Chemotherapy    She received treatment with bendamustine and Gazyva    05/27/2016 PET scan    1. No evidence of active lymphoma on whole-body scan. 2. Resolution of metabolic activity previously identified in the LEFT axilla. 3. Small amount free fluid the pelvis.    03/07/2017 PET scan    1. No findings of active lymphoma in  the chest, abdomen, or pelvis. 2. Other imaging findings of potential clinical significance: Aortic Atherosclerosis (ICD10-I70.0). Coronary atherosclerosis. Chronic paranasal sinusitis. Moderately distended gallbladder, chronic     REVIEW OF SYSTEMS:   Constitutional: Denies fevers, chills or abnormal weight loss Eyes: Denies blurriness of vision Ears, nose, mouth, throat, and face: Denies mucositis or sore throat Respiratory: Denies cough, dyspnea or wheezes Cardiovascular: Denies palpitation, chest discomfort or lower extremity swelling Gastrointestinal:  Denies nausea, heartburn or change in bowel habits Skin: Denies abnormal skin rashes Lymphatics: Denies new lymphadenopathy or easy bruising Neurological:Denies numbness, tingling or new weaknesses Behavioral/Psych: Mood is stable, no new changes  All other systems were reviewed with the patient and are negative.  I have reviewed the past medical history, past surgical history, social history and family history with the patient and they are unchanged from previous note.  ALLERGIES:  is allergic to aspirin; capoten [captopril]; levemir [insulin detemir]; micronase [glyburide]; onglyza [saxagliptin]; statins; and zocor [simvastatin].  MEDICATIONS:  Current Outpatient Medications  Medication Sig Dispense Refill  . acyclovir (ZOVIRAX) 400 MG tablet TAKE 1 TABLET BY MOUTH ONCE DAILY 30 tablet 5  . amLODipine (NORVASC) 10 MG tablet TAKE 1 TABLET BY MOUTH AT BEDTIME 90 tablet 3  . aspirin 81 MG tablet Take 40.5 mg by mouth 3 (three) times a week.    . Calcium Carbonate-Vit D-Min (CALCIUM 1200 PO) Take 1 tablet by mouth daily. Per patient takes in am and pm    .  cholecalciferol (VITAMIN D) 1000 UNITS tablet Take 1,000 Units by mouth daily.     . enalapril (VASOTEC) 20 MG tablet Take 20 mg by mouth every morning.    . fish oil-omega-3 fatty acids 1000 MG capsule Take 1 g by mouth daily.     Marland Kitchen glimepiride (AMARYL) 4 MG tablet Take 4 mg by  mouth daily with breakfast. Per patient takes in am and pm.    . glucose blood test strip Use as instructed 100 each 12  . lidocaine-prilocaine (EMLA) cream Apply 1 application topically as needed. 30 g 3  . loperamide (IMODIUM) 2 MG capsule Take by mouth as needed for diarrhea or loose stools.    Marland Kitchen LORazepam (ATIVAN) 0.5 MG tablet TAKE ONE TABLET BY MOUTH EVERY 8 HOURS AS NEEDED FOR ANXIETY 30 tablet 0  . Magnesium 500 MG CAPS Take 500 mg by mouth daily.     . metFORMIN (GLUCOPHAGE) 1000 MG tablet Take 1,000 mg by mouth 2 (two) times daily with a meal.     . Misc Natural Products (LUTEIN 20 PO) Take by mouth daily.    . Multiple Vitamins-Minerals (CENTRUM CARDIO) TABS Take 1 tablet by mouth daily.    . Multiple Vitamins-Minerals (ICAPS) CAPS Take by mouth daily.     No current facility-administered medications for this visit.     PHYSICAL EXAMINATION: ECOG PERFORMANCE STATUS: 1 - Symptomatic but completely ambulatory  Vitals:   11/04/17 0811  BP: (!) 147/52  Pulse: 80  Resp: 18  Temp: 98 F (36.7 C)  SpO2: 99%   Filed Weights   11/04/17 0811  Weight: 163 lb 6.4 oz (74.1 kg)    GENERAL:alert, no distress and comfortable SKIN: skin color, texture, turgor are normal, no rashes or significant lesions EYES: normal, Conjunctiva are pink and non-injected, sclera clear OROPHARYNX:no exudate, no erythema and lips, buccal mucosa, and tongue normal  NECK: supple, thyroid normal size, non-tender, without nodularity LYMPH:  no palpable lymphadenopathy in the cervical, axillary or inguinal LUNGS: clear to auscultation and percussion with normal breathing effort HEART: regular rate & rhythm and no murmurs and no lower extremity edema ABDOMEN:abdomen soft, non-tender and normal bowel sounds Musculoskeletal:no cyanosis of digits and no clubbing  NEURO: alert & oriented x 3 with fluent speech, no focal motor/sensory deficits  LABORATORY DATA:  I have reviewed the data as listed     Component Value Date/Time   NA 140 11/04/2017 0756   NA 136 01/07/2017 1004   K 4.6 11/04/2017 0756   K 4.5 01/07/2017 1004   CL 103 11/04/2017 0756   CO2 25 11/04/2017 0756   CO2 23 01/07/2017 1004   GLUCOSE 283 (H) 11/04/2017 0756   GLUCOSE 217 (H) 01/07/2017 1004   BUN 23 11/04/2017 0756   BUN 23.3 01/07/2017 1004   CREATININE 1.20 (H) 11/04/2017 0756   CREATININE 1.2 (H) 01/07/2017 1004   CALCIUM 9.0 11/04/2017 0756   CALCIUM 9.4 01/07/2017 1004   PROT 6.2 (L) 11/04/2017 0756   PROT 6.7 01/07/2017 1004   ALBUMIN 3.6 11/04/2017 0756   ALBUMIN 3.9 01/07/2017 1004   AST 15 11/04/2017 0756   AST 20 01/07/2017 1004   ALT 14 11/04/2017 0756   ALT 14 01/07/2017 1004   ALKPHOS 76 11/04/2017 0756   ALKPHOS 70 01/07/2017 1004   BILITOT 0.5 11/04/2017 0756   BILITOT 0.49 01/07/2017 1004   GFRNONAA 41 (L) 11/04/2017 0756   GFRNONAA 47 (L) 05/18/2013 1256   GFRAA 47 (L) 11/04/2017  0756   GFRAA 55 (L) 05/18/2013 1256    No results found for: SPEP, UPEP  Lab Results  Component Value Date   WBC 6.4 11/04/2017   NEUTROABS 5.0 11/04/2017   HGB 10.3 (L) 11/04/2017   HCT 30.8 (L) 11/04/2017   MCV 90.6 11/04/2017   PLT 229 11/04/2017      Chemistry      Component Value Date/Time   NA 140 11/04/2017 0756   NA 136 01/07/2017 1004   K 4.6 11/04/2017 0756   K 4.5 01/07/2017 1004   CL 103 11/04/2017 0756   CO2 25 11/04/2017 0756   CO2 23 01/07/2017 1004   BUN 23 11/04/2017 0756   BUN 23.3 01/07/2017 1004   CREATININE 1.20 (H) 11/04/2017 0756   CREATININE 1.2 (H) 01/07/2017 1004   GLU 202 (H) 08/09/2014 1000      Component Value Date/Time   CALCIUM 9.0 11/04/2017 0756   CALCIUM 9.4 01/07/2017 1004   ALKPHOS 76 11/04/2017 0756   ALKPHOS 70 01/07/2017 1004   AST 15 11/04/2017 0756   AST 20 01/07/2017 1004   ALT 14 11/04/2017 0756   ALT 14 01/07/2017 1004   BILITOT 0.5 11/04/2017 0756   BILITOT 0.49 01/07/2017 1004       All questions were answered. The patient  knows to call the clinic with any problems, questions or concerns. No barriers to learning was detected.  I spent 15 minutes counseling the patient face to face. The total time spent in the appointment was 20 minutes and more than 50% was on counseling and review of test results  Heath Lark, MD 11/04/2017 8:36 AM

## 2017-11-04 NOTE — Assessment & Plan Note (Signed)
Her kidney function tests is stable. Continue close monitoring. There is no contraindication to continue treatment for now.  

## 2017-11-19 ENCOUNTER — Ambulatory Visit: Payer: Medicare Other | Admitting: Podiatry

## 2017-11-19 ENCOUNTER — Encounter: Payer: Self-pay | Admitting: Podiatry

## 2017-11-19 DIAGNOSIS — B351 Tinea unguium: Secondary | ICD-10-CM

## 2017-11-19 DIAGNOSIS — E119 Type 2 diabetes mellitus without complications: Secondary | ICD-10-CM

## 2017-11-19 DIAGNOSIS — M79674 Pain in right toe(s): Secondary | ICD-10-CM

## 2017-11-19 DIAGNOSIS — M79675 Pain in left toe(s): Secondary | ICD-10-CM | POA: Diagnosis not present

## 2017-11-19 NOTE — Progress Notes (Signed)
Complaint:  Visit Type: Patient returns to my office for continued preventative foot care services. Complaint: Patient states" my nails have grown long and thick and become painful to walk and wear shoes" Patient has been diagnosed with DM with no foot complications. The patient presents for preventative foot care services. No changes to ROS  Podiatric Exam: Vascular: dorsalis pedis and posterior tibial pulses are palpable bilateral. Capillary return is immediate. Temperature gradient is WNL. Skin turgor WNL  Sensorium: Normal Semmes Weinstein monofilament test. Normal tactile sensation bilaterally. Nail Exam: Pt has thick disfigured discolored nails with subungual debris noted bilateral entire nail hallux through fifth toenails Ulcer Exam: There is no evidence of ulcer or pre-ulcerative changes or infection. Orthopedic Exam: Muscle tone and strength are WNL. No limitations in general ROM. No crepitus or effusions noted. Foot type and digits show no abnormalities. HAV  B/L Skin: No Porokeratosis. No infection or ulcers  Diagnosis:  Onychomycosis, , Pain in right toe, pain in left toes  Treatment & Plan Procedures and Treatment: Consent by patient was obtained for treatment procedures.   Debridement of mycotic and hypertrophic toenails, 1 through 5 bilateral and clearing of subungual debris. No ulceration, no infection noted.  Return Visit-Office Procedure: Patient instructed to return to the office for a follow up visit 10 weeks  for continued evaluation and treatment.    Lakyn Mantione DPM 

## 2018-01-02 ENCOUNTER — Telehealth: Payer: Self-pay | Admitting: Hematology and Oncology

## 2018-01-02 ENCOUNTER — Other Ambulatory Visit: Payer: Self-pay | Admitting: Hematology and Oncology

## 2018-01-02 ENCOUNTER — Inpatient Hospital Stay: Payer: Medicare Other | Attending: Hematology and Oncology

## 2018-01-02 ENCOUNTER — Inpatient Hospital Stay: Payer: Medicare Other

## 2018-01-02 ENCOUNTER — Inpatient Hospital Stay (HOSPITAL_BASED_OUTPATIENT_CLINIC_OR_DEPARTMENT_OTHER): Payer: Medicare Other | Admitting: Hematology and Oncology

## 2018-01-02 ENCOUNTER — Encounter: Payer: Self-pay | Admitting: Hematology and Oncology

## 2018-01-02 VITALS — BP 143/67 | HR 89 | Temp 98.0°F | Resp 18

## 2018-01-02 DIAGNOSIS — Z5112 Encounter for antineoplastic immunotherapy: Secondary | ICD-10-CM | POA: Insufficient documentation

## 2018-01-02 DIAGNOSIS — C8588 Other specified types of non-Hodgkin lymphoma, lymph nodes of multiple sites: Secondary | ICD-10-CM | POA: Diagnosis present

## 2018-01-02 DIAGNOSIS — E119 Type 2 diabetes mellitus without complications: Secondary | ICD-10-CM | POA: Insufficient documentation

## 2018-01-02 DIAGNOSIS — Z95828 Presence of other vascular implants and grafts: Secondary | ICD-10-CM

## 2018-01-02 DIAGNOSIS — N183 Chronic kidney disease, stage 3 unspecified: Secondary | ICD-10-CM

## 2018-01-02 DIAGNOSIS — C4499 Other specified malignant neoplasm of skin, unspecified: Secondary | ICD-10-CM | POA: Diagnosis not present

## 2018-01-02 DIAGNOSIS — C8580 Other specified types of non-Hodgkin lymphoma, unspecified site: Secondary | ICD-10-CM

## 2018-01-02 LAB — COMPREHENSIVE METABOLIC PANEL
ALT: 13 U/L (ref 0–44)
ANION GAP: 10 (ref 5–15)
AST: 17 U/L (ref 15–41)
Albumin: 3.5 g/dL (ref 3.5–5.0)
Alkaline Phosphatase: 75 U/L (ref 38–126)
BUN: 23 mg/dL (ref 8–23)
CHLORIDE: 103 mmol/L (ref 98–111)
CO2: 24 mmol/L (ref 22–32)
CREATININE: 1.39 mg/dL — AB (ref 0.44–1.00)
Calcium: 9.3 mg/dL (ref 8.9–10.3)
GFR, EST AFRICAN AMERICAN: 41 mL/min — AB (ref >60)
GFR, EST NON AFRICAN AMERICAN: 35 mL/min — AB (ref >60)
Glucose, Bld: 301 mg/dL — ABNORMAL HIGH (ref 70–99)
POTASSIUM: 5.1 mmol/L (ref 3.5–5.1)
SODIUM: 137 mmol/L (ref 135–145)
Total Bilirubin: 0.4 mg/dL (ref 0.3–1.2)
Total Protein: 6.2 g/dL — ABNORMAL LOW (ref 6.5–8.1)

## 2018-01-02 LAB — CBC WITH DIFFERENTIAL/PLATELET
Abs Immature Granulocytes: 0.48 10*3/uL — ABNORMAL HIGH (ref 0.00–0.07)
BASOS PCT: 1 %
Basophils Absolute: 0.1 10*3/uL (ref 0.0–0.1)
EOS ABS: 0.1 10*3/uL (ref 0.0–0.5)
Eosinophils Relative: 1 %
HCT: 31.7 % — ABNORMAL LOW (ref 36.0–46.0)
Hemoglobin: 10.6 g/dL — ABNORMAL LOW (ref 12.0–15.0)
Immature Granulocytes: 5 %
Lymphocytes Relative: 11 %
Lymphs Abs: 1.1 10*3/uL (ref 0.7–4.0)
MCH: 29.9 pg (ref 26.0–34.0)
MCHC: 33.4 g/dL (ref 30.0–36.0)
MCV: 89.3 fL (ref 80.0–100.0)
MONO ABS: 0.6 10*3/uL (ref 0.1–1.0)
MONOS PCT: 6 %
NEUTROS ABS: 7.5 10*3/uL (ref 1.7–7.7)
Neutrophils Relative %: 76 %
PLATELETS: 241 10*3/uL (ref 150–400)
RBC: 3.55 MIL/uL — ABNORMAL LOW (ref 3.87–5.11)
RDW: 12.9 % (ref 11.5–15.5)
WBC: 9.8 10*3/uL (ref 4.0–10.5)
nRBC: 0 % (ref 0.0–0.2)

## 2018-01-02 MED ORDER — SODIUM CHLORIDE 0.9% FLUSH
10.0000 mL | INTRAVENOUS | Status: DC | PRN
  Administered 2018-01-02: 10 mL
  Filled 2018-01-02: qty 10

## 2018-01-02 MED ORDER — ACETAMINOPHEN 325 MG PO TABS
650.0000 mg | ORAL_TABLET | Freq: Once | ORAL | Status: AC
  Administered 2018-01-02: 650 mg via ORAL

## 2018-01-02 MED ORDER — SODIUM CHLORIDE 0.9 % IV SOLN
1000.0000 mg | Freq: Once | INTRAVENOUS | Status: AC
  Administered 2018-01-02: 1000 mg via INTRAVENOUS
  Filled 2018-01-02: qty 40

## 2018-01-02 MED ORDER — HEPARIN SOD (PORK) LOCK FLUSH 100 UNIT/ML IV SOLN
500.0000 [IU] | Freq: Once | INTRAVENOUS | Status: AC | PRN
  Administered 2018-01-02: 500 [IU]
  Filled 2018-01-02: qty 5

## 2018-01-02 MED ORDER — DEXAMETHASONE SODIUM PHOSPHATE 10 MG/ML IJ SOLN
INTRAMUSCULAR | Status: AC
  Filled 2018-01-02: qty 1

## 2018-01-02 MED ORDER — DIPHENHYDRAMINE HCL 50 MG/ML IJ SOLN
INTRAMUSCULAR | Status: AC
  Filled 2018-01-02: qty 1

## 2018-01-02 MED ORDER — ACETAMINOPHEN 325 MG PO TABS
ORAL_TABLET | ORAL | Status: AC
  Filled 2018-01-02: qty 2

## 2018-01-02 MED ORDER — DIPHENHYDRAMINE HCL 50 MG/ML IJ SOLN
12.5000 mg | Freq: Once | INTRAMUSCULAR | Status: AC
  Administered 2018-01-02: 12.5 mg via INTRAVENOUS

## 2018-01-02 MED ORDER — INSULIN REGULAR HUMAN 100 UNIT/ML IJ SOLN
10.0000 [IU] | Freq: Once | INTRAMUSCULAR | Status: AC
  Administered 2018-01-02: 10 [IU] via SUBCUTANEOUS
  Filled 2018-01-02: qty 10

## 2018-01-02 MED ORDER — DEXAMETHASONE SODIUM PHOSPHATE 10 MG/ML IJ SOLN
10.0000 mg | Freq: Once | INTRAMUSCULAR | Status: AC
  Administered 2018-01-02: 10 mg via INTRAVENOUS

## 2018-01-02 NOTE — Patient Instructions (Signed)
[facility] Discharge Instructions for Patients Receiving Chemotherapy  Today you received the following chemotherapy agents: Gazyva   To help prevent nausea and vomiting after your treatment, we encourage you to take your nausea medication as directed.    If you develop nausea and vomiting that is not controlled by your nausea medication, call the clinic.   BELOW ARE SYMPTOMS THAT SHOULD BE REPORTED IMMEDIATELY:  *FEVER GREATER THAN 100.5 F  *CHILLS WITH OR WITHOUT FEVER  NAUSEA AND VOMITING THAT IS NOT CONTROLLED WITH YOUR NAUSEA MEDICATION  *UNUSUAL SHORTNESS OF BREATH  *UNUSUAL BRUISING OR BLEEDING  TENDERNESS IN MOUTH AND THROAT WITH OR WITHOUT PRESENCE OF ULCERS  *URINARY PROBLEMS  *BOWEL PROBLEMS  UNUSUAL RASH Items with * indicate a potential emergency and should be followed up as soon as possible.  Feel free to call the clinic should you have any questions or concerns. The clinic phone number is [phone].  Please show the CHEMO ALERT CARD at check-in to the Emergency Department and triage nurse.   

## 2018-01-02 NOTE — Telephone Encounter (Signed)
Gave avs and calendar ° °

## 2018-01-02 NOTE — Assessment & Plan Note (Signed)
She has no signs of cancer recurrence today Her last PET scan in February 2019 was negative We will continue maintenance therapy for total of 2 years I do not plan to repeat CT scan until completion of treatment next year

## 2018-01-02 NOTE — Assessment & Plan Note (Signed)
She had recurrent skin cancer from squamous cell carcinoma She has appointment scheduled to see her dermatologist for follow-up after recent resection I would defer to them for further follow-up

## 2018-01-02 NOTE — Progress Notes (Signed)
[facility] OFFICE PROGRESS NOTE  Patient Care Team: [name], Bill Salinas, MD as PCP - General (Family Medicine) Sharyne Peach, MD as Consulting Physician (Ophthalmology) Inda Castle, MD (Inactive) as Consulting Physician (Gastroenterology) Sypher, Herbie Baltimore, MD (Inactive) as Consulting Physician (Orthopedic Surgery) Jannette Spanner, MD as Referring Physician (Dermatology) Heath Lark, MD as Consulting Physician (Hematology and Oncology) Alphonsa Overall, MD as Consulting Physician (General Surgery)  ASSESSMENT & PLAN:  Marginal zone lymphoma She has no signs of cancer recurrence today Her last PET scan in February 2019 was negative We will continue maintenance therapy for total of 2 years I do not plan to repeat CT scan until completion of treatment next year   Recurrent skin cancer She had recurrent skin cancer from squamous cell carcinoma She has appointment scheduled to see her dermatologist for follow-up after recent resection I would defer to them for further follow-up  Chronic kidney disease (CKD), stage III (moderate) Her kidney function tests is stable. Continue close monitoring. There is no contraindication to continue treatment for now.    No orders of the defined types were placed in this encounter.   INTERVAL HISTORY: Please see below for problem oriented charting. She returns for further follow-up and chemotherapy, cycle #10 She is doing well She had recent skin resection for recurrent squamous cell carcinoma Her wound is healing well Denies new lymphadenopathy No recent infection, fever or chills  SUMMARY OF ONCOLOGIC HISTORY: Oncology History   Marginal zone lymphoma, with lymph node and bone marrow involvement along with splenomegaly   Primary site: Lymphoid Neoplasms   Staging method: AJCC 6th Edition   Clinical: Stage IV signed by Heath Lark, MD on 09/02/2013  7:38 PM   Summary: Stage IV       Marginal zone lymphoma ([place])   07/29/2013 Imaging    CT scan shows splenomegaly and abnormal lymphadenopathy.    08/13/2013 Imaging    PET CT scan confirmed lymphadenopathy and splenomegaly, those areas are PET avid    08/23/2013 Surgery    [id number] left axillary lymph node biopsy confirmed a low-grade lymphoma.    08/27/2013 Bone Marrow Biopsy    Bone marrow biopsy confirmed lymphoma.[id number]    09/08/2013 - 09/29/2013 Chemotherapy    She completed 4 cycles of rituximab with resolution of splenomegaly.    12/29/2013 Imaging    PET scan show complete response.    07/11/2014 Imaging    PET scan showed recurrent disease    07/19/2014 - 08/09/2014 Chemotherapy    She completed 4 cycles of rituximab with resolution of splenomegaly    10/12/2014 Imaging    Repeat PET/CT scan showed near complete response to treatment.    10/13/2014 - 02/09/2015 Chemotherapy    She is placed on rituximab maintenance therapy.    03/29/2015 Imaging    PET CT showed possible mild progression of splenomegaly    04/26/2015 - 02/22/2016 Chemotherapy    She started taking Ibrutinib    04/28/2015 Adverse Reaction    Treatment was placed temporarily on hold due to severe diarrhea and resumed at reduced dose    07/03/2015 PET scan    No hypermetabolic adenopathy in the neck, chest, abdomen or pelvis.2. Spleen is not hypermetabolic and appears slightly less prominent size.    02/06/2016 PET scan    Hypermetabolic LEFT axillary lymph nodes. Concern for HIGH-GRADE LYMPHOMA LOCALIZED RECURRENCE. 2. Prominent spleen without hypermetabolic activity. No change in size from comparison. 3. No additional hypermetabolic adenopathy on scan.    02/19/2016  Procedure    Technically successful ultrasound-guided core biopsy, left axillary adenopathy.    02/19/2016 Pathology Results    Lymph node, needle/core biopsy, left axilla - ATYPICAL LYMPHOID PROLIFERATION SUSPICIOUS FOR LYMPHOMA. - SEE COMMENT. Microscopic Comment The sections show multiple very small and  skinny fragments of lymph nodal tissue displaying areas of lymphoid expansion by a population of small to medium size lymphocytes with round to irregular nuclei, scanty to moderately abundant cytoplasm, dense chromatin and inconspicuous nucleoli. This is associated with scattering of "naked" germinal centers with abundant tingible body macrophages. To further evaluate this process, flow cytometric analysis was performed and shows a minor population of monoclonal B cells expressing pan B-cell antigens including CD20 with associated kappa light chain restriction. In addition, immunohistochemical stains were performed including CD20, CD79a, CD3, CD5, CD10, BCL-2, CD21 with appropriate controls. The stains show a mixture of T and B cells with slight predominance of B cells. CD21 highlights numerous areas with dendritic networks, correlating with the presence of germinal centers. CD10 shows focal positivity likely in germinal centers. BCL-2 is diffusely positive except in areas of apparent germinal centers. The overall features are atypical and suspicious for involvement by a B-cell lymphoproliferative process, particularly given the B-cell monoclonality by flow cytometry. However, morphologic evaluation is extremely limited due to small biopsy fragments and additional material (excisional biopsy) is strongly recommended for further evaluation. (BNS:ecj 02/21/2016)     03/04/2016 Procedure    Placement of single lumen port a cath via right internal jugular vein. The catheter tip lies at the cavo-atrial junction. A power injectable port a cath was placed and is ready for immediate use.    03/07/2016 - 05/31/2016 Chemotherapy    She received treatment with bendamustine and Gazyva    05/27/2016 PET scan    1. No evidence of active lymphoma on whole-body scan. 2. Resolution of metabolic activity previously identified in the LEFT axilla. 3. Small amount free fluid the pelvis.    03/07/2017 PET scan    1. No findings of  active lymphoma in the chest, abdomen, or pelvis. 2. Other imaging findings of potential clinical significance: Aortic Atherosclerosis (ICD10-I70.0). Coronary atherosclerosis. Chronic paranasal sinusitis. Moderately distended gallbladder, chronic     REVIEW OF SYSTEMS:   Constitutional: Denies fevers, chills or abnormal weight loss Eyes: Denies blurriness of vision Ears, nose, mouth, throat, and face: Denies mucositis or sore throat Respiratory: Denies cough, dyspnea or wheezes Cardiovascular: Denies palpitation, chest discomfort or lower extremity swelling Gastrointestinal:  Denies nausea, heartburn or change in bowel habits Lymphatics: Denies new lymphadenopathy or easy bruising Neurological:Denies numbness, tingling or new weaknesses Behavioral/Psych: Mood is stable, no new changes  All other systems were reviewed with the patient and are negative.  I have reviewed the past medical history, past surgical history, social history and family history with the patient and they are unchanged from previous note.  ALLERGIES:  is allergic to aspirin; capoten [captopril]; levemir [insulin detemir]; micronase [glyburide]; onglyza [saxagliptin]; statins; and zocor [simvastatin].  MEDICATIONS:  Current Outpatient Medications  Medication Sig Dispense Refill  . acyclovir (ZOVIRAX) 400 MG tablet TAKE 1 TABLET BY MOUTH ONCE DAILY 30 tablet 5  . amLODipine (NORVASC) 10 MG tablet TAKE 1 TABLET BY MOUTH AT BEDTIME 90 tablet 3  . aspirin 81 MG tablet Take 40.5 mg by mouth 3 (three) times a week.    . Calcium Carbonate-Vit D-Min (CALCIUM 1200 PO) Take 1 tablet by mouth daily. Per patient takes in am and pm    .  cholecalciferol (VITAMIN D) 1000 UNITS tablet Take 1,000 Units by mouth daily.     . enalapril (VASOTEC) 20 MG tablet Take 20 mg by mouth every morning.    . fish oil-omega-3 fatty acids 1000 MG capsule Take 1 g by mouth daily.     Marland Kitchen glimepiride (AMARYL) 4 MG tablet Take 4 mg by mouth daily with  breakfast. Per patient takes in am and pm.    . glucose blood test strip Use as instructed 100 each 12  . lidocaine-prilocaine (EMLA) cream Apply 1 application topically as needed. 30 g 3  . loperamide (IMODIUM) 2 MG capsule Take by mouth as needed for diarrhea or loose stools.    Marland Kitchen LORazepam (ATIVAN) 0.5 MG tablet TAKE ONE TABLET BY MOUTH EVERY 8 HOURS AS NEEDED FOR ANXIETY 30 tablet 0  . Magnesium 500 MG CAPS Take 500 mg by mouth daily.     . metFORMIN (GLUCOPHAGE) 1000 MG tablet Take 1,000 mg by mouth 2 (two) times daily with a meal.     . Misc Natural Products (LUTEIN 20 PO) Take by mouth daily.    . Multiple Vitamins-Minerals (CENTRUM CARDIO) TABS Take 1 tablet by mouth daily.    . Multiple Vitamins-Minerals (ICAPS) CAPS Take by mouth daily.     No current facility-administered medications for this visit.     PHYSICAL EXAMINATION: ECOG PERFORMANCE STATUS: 1 - Symptomatic but completely ambulatory  Vitals:   01/02/18 0839  BP: (!) 144/58  Pulse: 91  Resp: 18  Temp: 98.2 F (36.8 C)  SpO2: 99%   Filed Weights   01/02/18 0839  Weight: 160 lb 9.6 oz (72.8 kg)    GENERAL:alert, no distress and comfortable SKIN: skin color, texture, turgor are normal, no rashes or significant lesions EYES: normal, Conjunctiva are pink and non-injected, sclera clear OROPHARYNX:no exudate, no erythema and lips, buccal mucosa, and tongue normal  NECK: supple, thyroid normal size, non-tender, without nodularity LYMPH:  no palpable lymphadenopathy in the cervical, axillary or inguinal LUNGS: clear to auscultation and percussion with normal breathing effort HEART: regular rate & rhythm and no murmurs and no lower extremity edema ABDOMEN:abdomen soft, non-tender and normal bowel sounds Musculoskeletal:no cyanosis of digits and no clubbing  NEURO: alert & oriented x 3 with fluent speech, no focal motor/sensory deficits  LABORATORY DATA:  I have reviewed the data as listed    Component Value  Date/Time   NA 140 11/04/2017 0756   NA 136 01/07/2017 1004   K 4.6 11/04/2017 0756   K 4.5 01/07/2017 1004   CL 103 11/04/2017 0756   CO2 25 11/04/2017 0756   CO2 23 01/07/2017 1004   GLUCOSE 283 (H) 11/04/2017 0756   GLUCOSE 217 (H) 01/07/2017 1004   BUN 23 11/04/2017 0756   BUN 23.3 01/07/2017 1004   CREATININE 1.20 (H) 11/04/2017 0756   CREATININE 1.2 (H) 01/07/2017 1004   CALCIUM 9.0 11/04/2017 0756   CALCIUM 9.4 01/07/2017 1004   PROT 6.2 (L) 11/04/2017 0756   PROT 6.7 01/07/2017 1004   ALBUMIN 3.6 11/04/2017 0756   ALBUMIN 3.9 01/07/2017 1004   AST 15 11/04/2017 0756   AST 20 01/07/2017 1004   ALT 14 11/04/2017 0756   ALT 14 01/07/2017 1004   ALKPHOS 76 11/04/2017 0756   ALKPHOS 70 01/07/2017 1004   BILITOT 0.5 11/04/2017 0756   BILITOT 0.49 01/07/2017 1004   GFRNONAA 41 (L) 11/04/2017 0756   GFRNONAA 47 (L) 05/18/2013 1256   GFRAA 47 (L) 11/04/2017  0756   GFRAA 55 (L) 05/18/2013 1256    No results found for: SPEP, UPEP  Lab Results  Component Value Date   WBC 9.8 01/02/2018   NEUTROABS 7.5 01/02/2018   HGB 10.6 (L) 01/02/2018   HCT 31.7 (L) 01/02/2018   MCV 89.3 01/02/2018   PLT 241 01/02/2018      Chemistry      Component Value Date/Time   NA 140 11/04/2017 0756   NA 136 01/07/2017 1004   K 4.6 11/04/2017 0756   K 4.5 01/07/2017 1004   CL 103 11/04/2017 0756   CO2 25 11/04/2017 0756   CO2 23 01/07/2017 1004   BUN 23 11/04/2017 0756   BUN 23.3 01/07/2017 1004   CREATININE 1.20 (H) 11/04/2017 0756   CREATININE 1.2 (H) 01/07/2017 1004   GLU 202 (H) 08/09/2014 1000      Component Value Date/Time   CALCIUM 9.0 11/04/2017 0756   CALCIUM 9.4 01/07/2017 1004   ALKPHOS 76 11/04/2017 0756   ALKPHOS 70 01/07/2017 1004   AST 15 11/04/2017 0756   AST 20 01/07/2017 1004   ALT 14 11/04/2017 0756   ALT 14 01/07/2017 1004   BILITOT 0.5 11/04/2017 0756   BILITOT 0.49 01/07/2017 1004       All questions were answered. The patient knows to call the  clinic with any problems, questions or concerns. No barriers to learning was detected.  I spent 15 minutes counseling the patient face to face. The total time spent in the appointment was 20 minutes and more than 50% was on counseling and review of test results  Heath Lark, MD 01/02/2018 8:49 AM

## 2018-01-02 NOTE — Assessment & Plan Note (Signed)
Her kidney function tests is stable. Continue close monitoring. There is no contraindication to continue treatment for now.

## 2018-01-02 NOTE — Patient Instructions (Signed)
Implanted Port Home Guide An implanted port is a type of central line that is placed under the skin. Central lines are used to provide IV access when treatment or nutrition needs to be given through a person's veins. Implanted ports are used for long-term IV access. An implanted port may be placed because:  You need IV medicine that would be irritating to the small veins in your hands or arms.  You need long-term IV medicines, such as antibiotics.  You need IV nutrition for a long period.  You need frequent blood draws for lab tests.  You need dialysis.  Implanted ports are usually placed in the chest area, but they can also be placed in the upper arm, the abdomen, or the leg. An implanted port has two main parts:  Reservoir. The reservoir is round and will appear as a small, raised area under your skin. The reservoir is the part where a needle is inserted to give medicines or draw blood.  Catheter. The catheter is a thin, flexible tube that extends from the reservoir. The catheter is placed into a large vein. Medicine that is inserted into the reservoir goes into the catheter and then into the vein.  How will I care for my incision site? Do not get the incision site wet. Bathe or shower as directed by your health care provider. How is my port accessed? Special steps must be taken to access the port:  Before the port is accessed, a numbing cream can be placed on the skin. This helps numb the skin over the port site.  Your health care provider uses a sterile technique to access the port. ? Your health care provider must put on a mask and sterile gloves. ? The skin over your port is cleaned carefully with an antiseptic and allowed to dry. ? The port is gently pinched between sterile gloves, and a needle is inserted into the port.  Only "non-coring" port needles should be used to access the port. Once the port is accessed, a blood return should be checked. This helps ensure that the port  is in the vein and is not clogged.  If your port needs to remain accessed for a constant infusion, a clear (transparent) bandage will be placed over the needle site. The bandage and needle will need to be changed every week, or as directed by your health care provider.  Keep the bandage covering the needle clean and dry. Do not get it wet. Follow your health care provider's instructions on how to take a shower or bath while the port is accessed.  If your port does not need to stay accessed, no bandage is needed over the port.  What is flushing? Flushing helps keep the port from getting clogged. Follow your health care provider's instructions on how and when to flush the port. Ports are usually flushed with saline solution or a medicine called heparin. The need for flushing will depend on how the port is used.  If the port is used for intermittent medicines or blood draws, the port will need to be flushed: ? After medicines have been given. ? After blood has been drawn. ? As part of routine maintenance.  If a constant infusion is running, the port may not need to be flushed.  How long will my port stay implanted? The port can stay in for as long as your health care provider thinks it is needed. When it is time for the port to come out, surgery will be   done to remove it. The procedure is similar to the one performed when the port was put in. When should I seek immediate medical care? When you have an implanted port, you should seek immediate medical care if:  You notice a bad smell coming from the incision site.  You have swelling, redness, or drainage at the incision site.  You have more swelling or pain at the port site or the surrounding area.  You have a fever that is not controlled with medicine.  This information is not intended to replace advice given to you by your health care provider. Make sure you discuss any questions you have with your health care provider. Document  Released: 01/14/2005 Document Revised: 06/22/2015 Document Reviewed: 09/21/2012 Elsevier Interactive Patient Education  2017 [employer]  

## 2018-02-03 ENCOUNTER — Encounter: Payer: Self-pay | Admitting: Podiatry

## 2018-02-03 ENCOUNTER — Ambulatory Visit: Payer: Medicare Other | Admitting: Podiatry

## 2018-02-03 DIAGNOSIS — B351 Tinea unguium: Secondary | ICD-10-CM | POA: Diagnosis not present

## 2018-02-03 DIAGNOSIS — M79675 Pain in left toe(s): Secondary | ICD-10-CM

## 2018-02-03 DIAGNOSIS — E119 Type 2 diabetes mellitus without complications: Secondary | ICD-10-CM | POA: Diagnosis not present

## 2018-02-03 DIAGNOSIS — M79674 Pain in right toe(s): Secondary | ICD-10-CM | POA: Diagnosis not present

## 2018-02-03 NOTE — Progress Notes (Signed)
Complaint:  Visit Type: Patient returns to my office for continued preventative foot care services. Complaint: Patient states" my nails have grown long and thick and become painful to walk and wear shoes" Patient has been diagnosed with DM with no foot complications. The patient presents for preventative foot care services. No changes to ROS  Podiatric Exam: Vascular: dorsalis pedis and posterior tibial pulses are palpable bilateral. Capillary return is immediate. Temperature gradient is WNL. Skin turgor WNL  Sensorium: Normal Semmes Weinstein monofilament test. Normal tactile sensation bilaterally. Nail Exam: Pt has thick disfigured discolored nails with subungual debris noted bilateral entire nail hallux through fifth toenails Ulcer Exam: There is no evidence of ulcer or pre-ulcerative changes or infection. Orthopedic Exam: Muscle tone and strength are WNL. No limitations in general ROM. No crepitus or effusions noted. Foot type and digits show no abnormalities. HAV  B/L Skin: No Porokeratosis. No infection or ulcers  Diagnosis:  Onychomycosis, , Pain in right toe, pain in left toes  Treatment & Plan Procedures and Treatment: Consent by patient was obtained for treatment procedures.   Debridement of mycotic and hypertrophic toenails, 1 through 5 bilateral and clearing of subungual debris. No ulceration, no infection noted.  Return Visit-Office Procedure: Patient instructed to return to the office for a follow up visit 10 weeks  for continued evaluation and treatment.    Sharyon Peitz DPM 

## 2018-02-11 ENCOUNTER — Other Ambulatory Visit: Payer: Self-pay | Admitting: Hematology and Oncology

## 2018-02-11 DIAGNOSIS — C8580 Other specified types of non-Hodgkin lymphoma, unspecified site: Secondary | ICD-10-CM

## 2018-03-02 ENCOUNTER — Inpatient Hospital Stay (HOSPITAL_BASED_OUTPATIENT_CLINIC_OR_DEPARTMENT_OTHER): Payer: Medicare Other | Admitting: Hematology and Oncology

## 2018-03-02 ENCOUNTER — Encounter: Payer: Self-pay | Admitting: Hematology and Oncology

## 2018-03-02 ENCOUNTER — Inpatient Hospital Stay: Payer: Medicare Other

## 2018-03-02 ENCOUNTER — Inpatient Hospital Stay: Payer: Medicare Other | Attending: Hematology and Oncology

## 2018-03-02 DIAGNOSIS — Z7984 Long term (current) use of oral hypoglycemic drugs: Secondary | ICD-10-CM | POA: Diagnosis not present

## 2018-03-02 DIAGNOSIS — N183 Chronic kidney disease, stage 3 unspecified: Secondary | ICD-10-CM

## 2018-03-02 DIAGNOSIS — C8588 Other specified types of non-Hodgkin lymphoma, lymph nodes of multiple sites: Secondary | ICD-10-CM

## 2018-03-02 DIAGNOSIS — Z9221 Personal history of antineoplastic chemotherapy: Secondary | ICD-10-CM | POA: Insufficient documentation

## 2018-03-02 DIAGNOSIS — C884 Extranodal marginal zone B-cell lymphoma of mucosa-associated lymphoid tissue [MALT-lymphoma]: Secondary | ICD-10-CM | POA: Diagnosis present

## 2018-03-02 DIAGNOSIS — Z5112 Encounter for antineoplastic immunotherapy: Secondary | ICD-10-CM | POA: Insufficient documentation

## 2018-03-02 DIAGNOSIS — E1165 Type 2 diabetes mellitus with hyperglycemia: Secondary | ICD-10-CM | POA: Insufficient documentation

## 2018-03-02 DIAGNOSIS — I129 Hypertensive chronic kidney disease with stage 1 through stage 4 chronic kidney disease, or unspecified chronic kidney disease: Secondary | ICD-10-CM | POA: Diagnosis not present

## 2018-03-02 DIAGNOSIS — C8580 Other specified types of non-Hodgkin lymphoma, unspecified site: Secondary | ICD-10-CM

## 2018-03-02 DIAGNOSIS — Z95828 Presence of other vascular implants and grafts: Secondary | ICD-10-CM

## 2018-03-02 DIAGNOSIS — C8304 Small cell B-cell lymphoma, lymph nodes of axilla and upper limb: Secondary | ICD-10-CM | POA: Diagnosis not present

## 2018-03-02 DIAGNOSIS — I10 Essential (primary) hypertension: Secondary | ICD-10-CM

## 2018-03-02 DIAGNOSIS — D630 Anemia in neoplastic disease: Secondary | ICD-10-CM

## 2018-03-02 LAB — COMPREHENSIVE METABOLIC PANEL
ALT: 14 U/L (ref 0–44)
AST: 16 U/L (ref 15–41)
Albumin: 3.8 g/dL (ref 3.5–5.0)
Alkaline Phosphatase: 72 U/L (ref 38–126)
Anion gap: 8 (ref 5–15)
BUN: 14 mg/dL (ref 8–23)
CALCIUM: 9.2 mg/dL (ref 8.9–10.3)
CO2: 24 mmol/L (ref 22–32)
Chloride: 107 mmol/L (ref 98–111)
Creatinine, Ser: 1.13 mg/dL — ABNORMAL HIGH (ref 0.44–1.00)
GFR calc Af Amer: 52 mL/min — ABNORMAL LOW (ref >60)
GFR calc non Af Amer: 45 mL/min — ABNORMAL LOW (ref >60)
Glucose, Bld: 214 mg/dL — ABNORMAL HIGH (ref 70–99)
Potassium: 4.4 mmol/L (ref 3.5–5.1)
Sodium: 139 mmol/L (ref 135–145)
Total Bilirubin: 0.4 mg/dL (ref 0.3–1.2)
Total Protein: 6.4 g/dL — ABNORMAL LOW (ref 6.5–8.1)

## 2018-03-02 LAB — CBC WITH DIFFERENTIAL/PLATELET
Abs Immature Granulocytes: 0.24 10*3/uL — ABNORMAL HIGH (ref 0.00–0.07)
Basophils Absolute: 0 10*3/uL (ref 0.0–0.1)
Basophils Relative: 1 %
Eosinophils Absolute: 0.1 10*3/uL (ref 0.0–0.5)
Eosinophils Relative: 1 %
HEMATOCRIT: 32.5 % — AB (ref 36.0–46.0)
Hemoglobin: 10.6 g/dL — ABNORMAL LOW (ref 12.0–15.0)
Immature Granulocytes: 4 %
Lymphocytes Relative: 14 %
Lymphs Abs: 0.9 10*3/uL (ref 0.7–4.0)
MCH: 29.2 pg (ref 26.0–34.0)
MCHC: 32.6 g/dL (ref 30.0–36.0)
MCV: 89.5 fL (ref 80.0–100.0)
MONO ABS: 0.6 10*3/uL (ref 0.1–1.0)
MONOS PCT: 9 %
Neutro Abs: 4.7 10*3/uL (ref 1.7–7.7)
Neutrophils Relative %: 71 %
Platelets: 244 10*3/uL (ref 150–400)
RBC: 3.63 MIL/uL — ABNORMAL LOW (ref 3.87–5.11)
RDW: 13.1 % (ref 11.5–15.5)
WBC: 6.5 10*3/uL (ref 4.0–10.5)
nRBC: 0 % (ref 0.0–0.2)

## 2018-03-02 MED ORDER — SODIUM CHLORIDE 0.9% FLUSH
10.0000 mL | Freq: Once | INTRAVENOUS | Status: AC
  Administered 2018-03-02: 10 mL
  Filled 2018-03-02: qty 10

## 2018-03-02 MED ORDER — HEPARIN SOD (PORK) LOCK FLUSH 100 UNIT/ML IV SOLN
500.0000 [IU] | Freq: Once | INTRAVENOUS | Status: DC
  Filled 2018-03-02: qty 5

## 2018-03-02 NOTE — Assessment & Plan Note (Signed)
she will continue current medical management. I recommend close follow-up with primary care doctor for medication adjustment.  

## 2018-03-02 NOTE — Assessment & Plan Note (Signed)
The patient has intermittent, recurrence of anemia, likely due to anemia of chronic disease due to poorly controlled diabetes and mild chronic kidney disease She is not symptomatic. Observe only for now

## 2018-03-02 NOTE — Assessment & Plan Note (Signed)
Her kidney function tests is stable. Continue close monitoring. There is no contraindication to continue treatment for now.

## 2018-03-02 NOTE — Assessment & Plan Note (Signed)
She has no signs of cancer recurrence today Her last PET scan in February 2019 was negative We will continue maintenance therapy for total of 2 years I do not plan to repeat CT scan until completion of treatment in May 2020

## 2018-03-02 NOTE — Progress Notes (Signed)
[place] OFFICE PROGRESS NOTE  Patient Care Team: [name], Bill Salinas, MD as PCP - General (Family Medicine) Sharyne Peach, MD as Consulting Physician (Ophthalmology) Inda Castle, MD (Inactive) as Consulting Physician (Gastroenterology) Sypher, Herbie Baltimore, MD (Inactive) as Consulting Physician (Orthopedic Surgery) Jannette Spanner, MD as Referring Physician (Dermatology) Heath Lark, MD as Consulting Physician (Hematology and Oncology) Alphonsa Overall, MD as Consulting Physician (General Surgery)  ASSESSMENT & PLAN:  Marginal zone lymphoma She has no signs of cancer recurrence today Her last PET scan in February 2019 was negative We will continue maintenance therapy for total of 2 years I do not plan to repeat CT scan until completion of treatment in May 2020   Anemia in neoplastic disease The patient has intermittent, recurrence of anemia, likely due to anemia of chronic disease due to poorly controlled diabetes and mild chronic kidney disease She is not symptomatic. Observe only for now  Essential hypertension she will continue current medical management. I recommend close follow-up with primary care doctor for medication adjustment.   Chronic kidney disease (CKD), stage III (moderate) Her kidney function tests is stable. Continue close monitoring. There is no contraindication to continue treatment for now.    No orders of the defined types were placed in this encounter.   INTERVAL HISTORY: Please see below for problem oriented charting. She returns to be seen prior to cycle 11 of chemotherapy She feels well No recent infection, fever or chills No new lymphadenopathy Her appetite is stable  SUMMARY OF ONCOLOGIC HISTORY: Oncology History   Marginal zone lymphoma, with lymph node and bone marrow involvement along with splenomegaly   Primary site: Lymphoid Neoplasms   Staging method: AJCC 6th Edition   Clinical: Stage IV signed by Heath Lark, MD on  09/02/2013  7:38 PM   Summary: Stage IV       Marginal zone lymphoma ([place])   07/29/2013 Imaging    CT scan shows splenomegaly and abnormal lymphadenopathy.    08/13/2013 Imaging    PET CT scan confirmed lymphadenopathy and splenomegaly, those areas are PET avid    08/23/2013 Surgery    [id number] left axillary lymph node biopsy confirmed a low-grade lymphoma.    08/27/2013 Bone Marrow Biopsy    Bone marrow biopsy confirmed lymphoma.[id number]    09/08/2013 - 09/29/2013 Chemotherapy    She completed 4 cycles of rituximab with resolution of splenomegaly.    12/29/2013 Imaging    PET scan show complete response.    07/11/2014 Imaging    PET scan showed recurrent disease    07/19/2014 - 08/09/2014 Chemotherapy    She completed 4 cycles of rituximab with resolution of splenomegaly    10/12/2014 Imaging    Repeat PET/CT scan showed near complete response to treatment.    10/13/2014 - 02/09/2015 Chemotherapy    She is placed on rituximab maintenance therapy.    03/29/2015 Imaging    PET CT showed possible mild progression of splenomegaly    04/26/2015 - 02/22/2016 Chemotherapy    She started taking Ibrutinib    04/28/2015 Adverse Reaction    Treatment was placed temporarily on hold due to severe diarrhea and resumed at reduced dose    07/03/2015 PET scan    No hypermetabolic adenopathy in the neck, chest, abdomen or pelvis.2. Spleen is not hypermetabolic and appears slightly less prominent size.    02/06/2016 PET scan    Hypermetabolic LEFT axillary lymph nodes. Concern for HIGH-GRADE LYMPHOMA LOCALIZED RECURRENCE. 2. Prominent spleen without hypermetabolic activity. No  change in size from comparison. 3. No additional hypermetabolic adenopathy on scan.    02/19/2016 Procedure    Technically successful ultrasound-guided core biopsy, left axillary adenopathy.    02/19/2016 Pathology Results    Lymph node, needle/core biopsy, left axilla - ATYPICAL LYMPHOID PROLIFERATION SUSPICIOUS FOR LYMPHOMA. -  SEE COMMENT. Microscopic Comment The sections show multiple very small and skinny fragments of lymph nodal tissue displaying areas of lymphoid expansion by a population of small to medium size lymphocytes with round to irregular nuclei, scanty to moderately abundant cytoplasm, dense chromatin and inconspicuous nucleoli. This is associated with scattering of "naked" germinal centers with abundant tingible body macrophages. To further evaluate this process, flow cytometric analysis was performed and shows a minor population of monoclonal B cells expressing pan B-cell antigens including CD20 with associated kappa light chain restriction. In addition, immunohistochemical stains were performed including CD20, CD79a, CD3, CD5, CD10, BCL-2, CD21 with appropriate controls. The stains show a mixture of T and B cells with slight predominance of B cells. CD21 highlights numerous areas with dendritic networks, correlating with the presence of germinal centers. CD10 shows focal positivity likely in germinal centers. BCL-2 is diffusely positive except in areas of apparent germinal centers. The overall features are atypical and suspicious for involvement by a B-cell lymphoproliferative process, particularly given the B-cell monoclonality by flow cytometry. However, morphologic evaluation is extremely limited due to small biopsy fragments and additional material (excisional biopsy) is strongly recommended for further evaluation. (BNS:ecj 02/21/2016)     03/04/2016 Procedure    Placement of single lumen port a cath via right internal jugular vein. The catheter tip lies at the cavo-atrial junction. A power injectable port a cath was placed and is ready for immediate use.    03/07/2016 - 05/31/2016 Chemotherapy    She received treatment with bendamustine and Gazyva    05/27/2016 PET scan    1. No evidence of active lymphoma on whole-body scan. 2. Resolution of metabolic activity previously identified in the LEFT axilla. 3. Small  amount free fluid the pelvis.    03/07/2017 PET scan    1. No findings of active lymphoma in the chest, abdomen, or pelvis. 2. Other imaging findings of potential clinical significance: Aortic Atherosclerosis (ICD10-I70.0). Coronary atherosclerosis. Chronic paranasal sinusitis. Moderately distended gallbladder, chronic     REVIEW OF SYSTEMS:   Constitutional: Denies fevers, chills or abnormal weight loss Eyes: Denies blurriness of vision Ears, nose, mouth, throat, and face: Denies mucositis or sore throat Respiratory: Denies cough, dyspnea or wheezes Cardiovascular: Denies palpitation, chest discomfort or lower extremity swelling Gastrointestinal:  Denies nausea, heartburn or change in bowel habits Skin: Denies abnormal skin rashes Lymphatics: Denies new lymphadenopathy or easy bruising Neurological:Denies numbness, tingling or new weaknesses Behavioral/Psych: Mood is stable, no new changes  All other systems were reviewed with the patient and are negative.  I have reviewed the past medical history, past surgical history, social history and family history with the patient and they are unchanged from previous note.  ALLERGIES:  is allergic to aspirin; capoten [captopril]; levemir [insulin detemir]; micronase [glyburide]; onglyza [saxagliptin]; statins; and zocor [simvastatin].  MEDICATIONS:  Current Outpatient Medications  Medication Sig Dispense Refill  . acyclovir (ZOVIRAX) 400 MG tablet TAKE 1 TABLET BY MOUTH ONCE DAILY 30 tablet 0  . amLODipine (NORVASC) 10 MG tablet TAKE 1 TABLET BY MOUTH AT BEDTIME 90 tablet 3  . aspirin 81 MG tablet Take 40.5 mg by mouth 3 (three) times a week.    . Calcium  Carbonate-Vit D-Min (CALCIUM 1200 PO) Take 1 tablet by mouth daily. Per patient takes in am and pm    . cholecalciferol (VITAMIN D) 1000 UNITS tablet Take 1,000 Units by mouth daily.     . enalapril (VASOTEC) 20 MG tablet Take 20 mg by mouth every morning.    . fish oil-omega-3 fatty acids  1000 MG capsule Take 1 g by mouth daily.     Marland Kitchen glimepiride (AMARYL) 4 MG tablet Take 4 mg by mouth daily with breakfast. Per patient takes in am and pm.    . glucose blood test strip Use as instructed 100 each 12  . lidocaine-prilocaine (EMLA) cream Apply 1 application topically as needed. 30 g 3  . loperamide (IMODIUM) 2 MG capsule Take by mouth as needed for diarrhea or loose stools.    . Magnesium 500 MG CAPS Take 500 mg by mouth daily.     . metFORMIN (GLUCOPHAGE) 1000 MG tablet Take 1,000 mg by mouth 2 (two) times daily with a meal.     . Misc Natural Products (LUTEIN 20 PO) Take by mouth daily.    . Multiple Vitamins-Minerals (CENTRUM CARDIO) TABS Take 1 tablet by mouth daily.    . Multiple Vitamins-Minerals (ICAPS) CAPS Take by mouth daily.     No current facility-administered medications for this visit.     PHYSICAL EXAMINATION: ECOG PERFORMANCE STATUS: 0 - Asymptomatic  Vitals:   03/02/18 0945  BP: (!) 165/66  Pulse: 85  Resp: 17  Temp: 97.9 F (36.6 C)  SpO2: 98%   Filed Weights   03/02/18 0945  Weight: 162 lb (73.5 kg)    GENERAL:alert, no distress and comfortable SKIN: skin color, texture, turgor are normal, no rashes or significant lesions EYES: normal, Conjunctiva are pink and non-injected, sclera clear OROPHARYNX:no exudate, no erythema and lips, buccal mucosa, and tongue normal  NECK: supple, thyroid normal size, non-tender, without nodularity LYMPH:  no palpable lymphadenopathy in the cervical, axillary or inguinal LUNGS: clear to auscultation and percussion with normal breathing effort HEART: regular rate & rhythm and no murmurs and no lower extremity edema ABDOMEN:abdomen soft, non-tender and normal bowel sounds Musculoskeletal:no cyanosis of digits and no clubbing  NEURO: alert & oriented x 3 with fluent speech, no focal motor/sensory deficits  LABORATORY DATA:  I have reviewed the data as listed    Component Value Date/Time   NA 137 01/02/2018 0808    NA 136 01/07/2017 1004   K 5.1 01/02/2018 0808   K 4.5 01/07/2017 1004   CL 103 01/02/2018 0808   CO2 24 01/02/2018 0808   CO2 23 01/07/2017 1004   GLUCOSE 301 (H) 01/02/2018 0808   GLUCOSE 217 (H) 01/07/2017 1004   BUN 23 01/02/2018 0808   BUN 23.3 01/07/2017 1004   CREATININE 1.39 (H) 01/02/2018 0808   CREATININE 1.2 (H) 01/07/2017 1004   CALCIUM 9.3 01/02/2018 0808   CALCIUM 9.4 01/07/2017 1004   PROT 6.2 (L) 01/02/2018 0808   PROT 6.7 01/07/2017 1004   ALBUMIN 3.5 01/02/2018 0808   ALBUMIN 3.9 01/07/2017 1004   AST 17 01/02/2018 0808   AST 20 01/07/2017 1004   ALT 13 01/02/2018 0808   ALT 14 01/07/2017 1004   ALKPHOS 75 01/02/2018 0808   ALKPHOS 70 01/07/2017 1004   BILITOT 0.4 01/02/2018 0808   BILITOT 0.49 01/07/2017 1004   GFRNONAA 35 (L) 01/02/2018 0808   GFRNONAA 47 (L) 05/18/2013 1256   GFRAA 41 (L) 01/02/2018 0808   GFRAA 55 (  L) 05/18/2013 1256    No results found for: SPEP, UPEP  Lab Results  Component Value Date   WBC 6.5 03/02/2018   NEUTROABS 4.7 03/02/2018   HGB 10.6 (L) 03/02/2018   HCT 32.5 (L) 03/02/2018   MCV 89.5 03/02/2018   PLT 244 03/02/2018      Chemistry      Component Value Date/Time   NA 137 01/02/2018 0808   NA 136 01/07/2017 1004   K 5.1 01/02/2018 0808   K 4.5 01/07/2017 1004   CL 103 01/02/2018 0808   CO2 24 01/02/2018 0808   CO2 23 01/07/2017 1004   BUN 23 01/02/2018 0808   BUN 23.3 01/07/2017 1004   CREATININE 1.39 (H) 01/02/2018 0808   CREATININE 1.2 (H) 01/07/2017 1004   GLU 202 (H) 08/09/2014 1000      Component Value Date/Time   CALCIUM 9.3 01/02/2018 0808   CALCIUM 9.4 01/07/2017 1004   ALKPHOS 75 01/02/2018 0808   ALKPHOS 70 01/07/2017 1004   AST 17 01/02/2018 0808   AST 20 01/07/2017 1004   ALT 13 01/02/2018 0808   ALT 14 01/07/2017 1004   BILITOT 0.4 01/02/2018 0808   BILITOT 0.49 01/07/2017 1004       All questions were answered. The patient knows to call the clinic with any problems, questions or  concerns. No barriers to learning was detected.  I spent 15 minutes counseling the patient face to face. The total time spent in the appointment was 20 minutes and more than 50% was on counseling and review of test results  Heath Lark, MD 03/02/2018 10:06 AM

## 2018-03-03 ENCOUNTER — Inpatient Hospital Stay: Payer: Medicare Other

## 2018-03-03 VITALS — BP 149/64 | HR 98 | Temp 98.6°F | Resp 16

## 2018-03-03 DIAGNOSIS — Z5112 Encounter for antineoplastic immunotherapy: Secondary | ICD-10-CM | POA: Diagnosis not present

## 2018-03-03 DIAGNOSIS — C8580 Other specified types of non-Hodgkin lymphoma, unspecified site: Secondary | ICD-10-CM

## 2018-03-03 MED ORDER — DEXAMETHASONE SODIUM PHOSPHATE 10 MG/ML IJ SOLN
INTRAMUSCULAR | Status: AC
  Filled 2018-03-03: qty 1

## 2018-03-03 MED ORDER — ACETAMINOPHEN 325 MG PO TABS
ORAL_TABLET | ORAL | Status: AC
  Filled 2018-03-03: qty 2

## 2018-03-03 MED ORDER — DIPHENHYDRAMINE HCL 50 MG/ML IJ SOLN
12.5000 mg | Freq: Once | INTRAMUSCULAR | Status: AC
  Administered 2018-03-03: 12.5 mg via INTRAVENOUS

## 2018-03-03 MED ORDER — SODIUM CHLORIDE 0.9% FLUSH
10.0000 mL | INTRAVENOUS | Status: DC | PRN
  Administered 2018-03-03: 10 mL
  Filled 2018-03-03: qty 10

## 2018-03-03 MED ORDER — DIPHENHYDRAMINE HCL 50 MG/ML IJ SOLN
INTRAMUSCULAR | Status: AC
  Filled 2018-03-03: qty 1

## 2018-03-03 MED ORDER — ACETAMINOPHEN 325 MG PO TABS
650.0000 mg | ORAL_TABLET | Freq: Once | ORAL | Status: AC
  Administered 2018-03-03: 650 mg via ORAL

## 2018-03-03 MED ORDER — DEXAMETHASONE SODIUM PHOSPHATE 10 MG/ML IJ SOLN
10.0000 mg | Freq: Once | INTRAMUSCULAR | Status: AC
  Administered 2018-03-03: 10 mg via INTRAVENOUS

## 2018-03-03 MED ORDER — SODIUM CHLORIDE 0.9 % IV SOLN
750.0000 mL | Freq: Once | INTRAVENOUS | Status: AC
  Administered 2018-03-03: 250 mL via INTRAVENOUS
  Filled 2018-03-03: qty 750

## 2018-03-03 MED ORDER — SODIUM CHLORIDE 0.9 % IV SOLN
1000.0000 mg | Freq: Once | INTRAVENOUS | Status: AC
  Administered 2018-03-03: 1000 mg via INTRAVENOUS
  Filled 2018-03-03: qty 40

## 2018-03-03 MED ORDER — HEPARIN SOD (PORK) LOCK FLUSH 100 UNIT/ML IV SOLN
500.0000 [IU] | Freq: Once | INTRAVENOUS | Status: AC | PRN
  Administered 2018-03-03: 500 [IU]
  Filled 2018-03-03: qty 5

## 2018-03-03 NOTE — Patient Instructions (Signed)
[facility] Discharge Instructions for Patients Receiving Chemotherapy  Today you received the following chemotherapy agents: Gazyva   To help prevent nausea and vomiting after your treatment, we encourage you to take your nausea medication as directed.    If you develop nausea and vomiting that is not controlled by your nausea medication, call the clinic.   BELOW ARE SYMPTOMS THAT SHOULD BE REPORTED IMMEDIATELY:  *FEVER GREATER THAN 100.5 F  *CHILLS WITH OR WITHOUT FEVER  NAUSEA AND VOMITING THAT IS NOT CONTROLLED WITH YOUR NAUSEA MEDICATION  *UNUSUAL SHORTNESS OF BREATH  *UNUSUAL BRUISING OR BLEEDING  TENDERNESS IN MOUTH AND THROAT WITH OR WITHOUT PRESENCE OF ULCERS  *URINARY PROBLEMS  *BOWEL PROBLEMS  UNUSUAL RASH Items with * indicate a potential emergency and should be followed up as soon as possible.  Feel free to call the clinic should you have any questions or concerns. The clinic phone number is [phone].  Please show the CHEMO ALERT CARD at check-in to the Emergency Department and triage nurse.   

## 2018-04-08 ENCOUNTER — Other Ambulatory Visit: Payer: Self-pay | Admitting: Hematology and Oncology

## 2018-04-08 DIAGNOSIS — C8580 Other specified types of non-Hodgkin lymphoma, unspecified site: Secondary | ICD-10-CM

## 2018-04-29 ENCOUNTER — Telehealth: Payer: Self-pay

## 2018-04-29 ENCOUNTER — Other Ambulatory Visit: Payer: Self-pay | Admitting: Hematology and Oncology

## 2018-04-29 DIAGNOSIS — C8580 Other specified types of non-Hodgkin lymphoma, unspecified site: Secondary | ICD-10-CM

## 2018-04-29 NOTE — Telephone Encounter (Signed)
-----   Message from Heath Lark, MD sent at 04/29/2018  9:17 AM EDT ----- Regarding: last treatment Friday Friday is supposed to be her last maintenance treatment I am not keen for her to receive it unless she is adamant she wants it I will not worry about port flush If she is comfortable, we can cancel it and I will schedule repeat scan in June and then get her port removed after that

## 2018-04-29 NOTE — Telephone Encounter (Signed)
Called and given below message. She verbalized understanding. She is agreeable to cancel Friday's appt's.

## 2018-04-30 ENCOUNTER — Telehealth: Payer: Self-pay | Admitting: Hematology and Oncology

## 2018-04-30 NOTE — Telephone Encounter (Signed)
Scheduled per sch msg. Called and spoke with patient. Confirmed date and time ° °

## 2018-05-01 ENCOUNTER — Ambulatory Visit: Payer: Medicare Other

## 2018-05-01 ENCOUNTER — Other Ambulatory Visit: Payer: Medicare Other

## 2018-05-01 ENCOUNTER — Ambulatory Visit: Payer: Medicare Other | Admitting: Hematology and Oncology

## 2018-05-05 ENCOUNTER — Ambulatory Visit: Payer: Medicare Other | Admitting: Podiatry

## 2018-05-14 ENCOUNTER — Other Ambulatory Visit: Payer: Self-pay | Admitting: Hematology and Oncology

## 2018-05-14 DIAGNOSIS — C8580 Other specified types of non-Hodgkin lymphoma, unspecified site: Secondary | ICD-10-CM

## 2018-05-27 ENCOUNTER — Ambulatory Visit: Payer: Medicare Other | Admitting: Podiatry

## 2018-05-27 ENCOUNTER — Encounter: Payer: Self-pay | Admitting: Podiatry

## 2018-05-27 ENCOUNTER — Other Ambulatory Visit: Payer: Self-pay

## 2018-05-27 VITALS — Temp 98.4°F

## 2018-05-27 DIAGNOSIS — E119 Type 2 diabetes mellitus without complications: Secondary | ICD-10-CM

## 2018-05-27 DIAGNOSIS — M79674 Pain in right toe(s): Secondary | ICD-10-CM

## 2018-05-27 DIAGNOSIS — B351 Tinea unguium: Secondary | ICD-10-CM | POA: Diagnosis not present

## 2018-05-27 DIAGNOSIS — M79675 Pain in left toe(s): Secondary | ICD-10-CM | POA: Diagnosis not present

## 2018-05-27 NOTE — Progress Notes (Signed)
Complaint:  Visit Type: Patient returns to my office for continued preventative foot care services. Complaint: Patient states" my nails have grown long and thick and become painful to walk and wear shoes" Patient has been diagnosed with DM with no foot complications. The patient presents for preventative foot care services. No changes to ROS  Podiatric Exam: Vascular: dorsalis pedis and posterior tibial pulses are palpable bilateral. Capillary return is immediate. Temperature gradient is WNL. Skin turgor WNL  Sensorium: Normal Semmes Weinstein monofilament test. Normal tactile sensation bilaterally. Nail Exam: Pt has thick disfigured discolored nails with subungual debris noted bilateral entire nail hallux through fifth toenails Ulcer Exam: There is no evidence of ulcer or pre-ulcerative changes or infection. Orthopedic Exam: Muscle tone and strength are WNL. No limitations in general ROM. No crepitus or effusions noted. Foot type and digits show no abnormalities. HAV  B/L Skin: No Porokeratosis. No infection or ulcers  Diagnosis:  Onychomycosis, , Pain in right toe, pain in left toes  Treatment & Plan Procedures and Treatment: Consent by patient was obtained for treatment procedures.   Debridement of mycotic and hypertrophic toenails, 1 through 5 bilateral and clearing of subungual debris. No ulceration, no infection noted.  Return Visit-Office Procedure: Patient instructed to return to the office for a follow up visit 10 weeks  for continued evaluation and treatment.    Gardiner Barefoot DPM

## 2018-05-28 IMAGING — US IR US GUIDE VASC ACCESS RIGHT
1 series · 1 of 1 positions shown · non-contrast
Comparison: none

CLINICAL DATA: B-cell lymphoma and need for porta cath to begin
chemotherapy.

[Series 1: ir fluoro/shunt/fist · 1 of 1 slices shown]
[im 1/1]
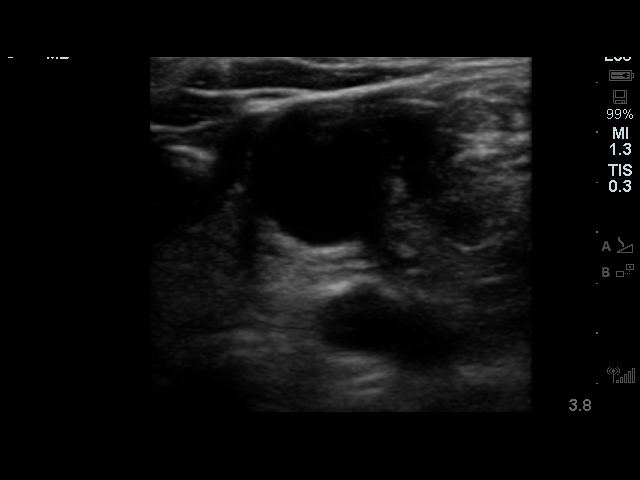

[1 of 1 positions shown; findings below may reference images not displayed]

EXAM:
IMPLANTED PORT A CATH PLACEMENT WITH ULTRASOUND AND FLUOROSCOPIC
GUIDANCE

ANESTHESIA/SEDATION:
2.0 mg IV Versed; 100 mcg IV Fentanyl

Total Moderate Sedation Time:  33 minutes

The patient's level of consciousness and physiologic status were
continuously monitored during the procedure by Radiology nursing.

Additional Medications: 2 g IV Ancef. As antibiotic prophylaxis,
Ancef was ordered pre-procedure and administered intravenously
within one hour of incision.

FLUOROSCOPY TIME:  1 minutes and 6 seconds.  7.5 mGy.

PROCEDURE:
The procedure, risks, benefits, and alternatives were explained to
the patient. Questions regarding the procedure were encouraged and
answered. The patient understands and consents to the procedure. A
time-out was performed prior to initiating the procedure.

Ultrasound was utilized to confirm patency of the right internal
jugular vein. The right neck and chest were prepped with
chlorhexidine in a sterile fashion, and a sterile drape was applied
covering the operative field. Maximum barrier sterile technique with
sterile gowns and gloves were used for the procedure. Local
anesthesia was provided with 1% lidocaine.

After creating a small venotomy incision, a 21 gauge needle was
advanced into the right internal jugular vein under direct,
real-time ultrasound guidance. Ultrasound image documentation was
performed. After securing guidewire access, an 8 Fr dilator was
placed. A J-wire was kinked to measure appropriate catheter length.

A subcutaneous port pocket was then created along the upper chest
wall utilizing sharp and blunt dissection. Portable cautery was
utilized. The pocket was irrigated with sterile saline.

A single lumen power injectable port was chosen for placement. The 8
Fr catheter was tunneled from the port pocket site to the venotomy
incision. The port was placed in the pocket. External catheter was
trimmed to appropriate length based on guidewire measurement.

At the venotomy, an 8 Fr peel-away sheath was placed over a
guidewire. The catheter was then placed through the sheath and the
sheath removed. Final catheter positioning was confirmed and
documented with a fluoroscopic spot image. The port was accessed
with a needle and aspirated and flushed with heparinized saline. The
access needle was removed.

The venotomy and port pocket incisions were closed with subcutaneous
3-0 Monocryl and subcuticular 4-0 Vicryl. Dermabond was applied to
both incisions.

COMPLICATIONS:
COMPLICATIONS
None
FINDINGS: After catheter placement, the tip lies at the [place] junction.
The catheter aspirates normally and is ready for immediate use.
IMPRESSION: Placement of single lumen port a cath via right internal jugular
vein. The catheter tip lies at the N[place] junction. A power
injectable port a cath was placed and is ready for immediate use.

## 2018-06-15 ENCOUNTER — Other Ambulatory Visit: Payer: Self-pay | Admitting: Hematology and Oncology

## 2018-06-15 DIAGNOSIS — C8580 Other specified types of non-Hodgkin lymphoma, unspecified site: Secondary | ICD-10-CM

## 2018-07-03 ENCOUNTER — Other Ambulatory Visit: Payer: Medicare Other

## 2018-07-03 ENCOUNTER — Ambulatory Visit (HOSPITAL_COMMUNITY): Payer: Medicare Other

## 2018-07-03 ENCOUNTER — Telehealth: Payer: Self-pay | Admitting: *Deleted

## 2018-07-03 NOTE — Telephone Encounter (Signed)
Due to insurance the PET scan was canceled. Patient called to cancel lab and flush scheduled for today. The appointments are rescheduled for Monday prior to office visit. Patient aware of date and times.

## 2018-07-06 ENCOUNTER — Encounter: Payer: Self-pay | Admitting: Hematology and Oncology

## 2018-07-06 ENCOUNTER — Telehealth: Payer: Self-pay

## 2018-07-06 ENCOUNTER — Inpatient Hospital Stay: Payer: Medicare Other

## 2018-07-06 ENCOUNTER — Other Ambulatory Visit: Payer: Self-pay

## 2018-07-06 ENCOUNTER — Inpatient Hospital Stay: Payer: Medicare Other | Attending: Hematology and Oncology | Admitting: Hematology and Oncology

## 2018-07-06 VITALS — BP 153/68 | HR 79 | Temp 98.7°F | Resp 18 | Ht 64.0 in | Wt 163.2 lb

## 2018-07-06 DIAGNOSIS — C8580 Other specified types of non-Hodgkin lymphoma, unspecified site: Secondary | ICD-10-CM

## 2018-07-06 DIAGNOSIS — N183 Chronic kidney disease, stage 3 unspecified: Secondary | ICD-10-CM

## 2018-07-06 DIAGNOSIS — C8588 Other specified types of non-Hodgkin lymphoma, lymph nodes of multiple sites: Secondary | ICD-10-CM | POA: Diagnosis not present

## 2018-07-06 DIAGNOSIS — E1121 Type 2 diabetes mellitus with diabetic nephropathy: Secondary | ICD-10-CM

## 2018-07-06 DIAGNOSIS — D630 Anemia in neoplastic disease: Secondary | ICD-10-CM | POA: Diagnosis not present

## 2018-07-06 DIAGNOSIS — Z95828 Presence of other vascular implants and grafts: Secondary | ICD-10-CM

## 2018-07-06 LAB — CMP (CANCER CENTER ONLY)
ALT: 10 U/L (ref 0–44)
AST: 15 U/L (ref 15–41)
Albumin: 3.8 g/dL (ref 3.5–5.0)
Alkaline Phosphatase: 76 U/L (ref 38–126)
Anion gap: 11 (ref 5–15)
BUN: 25 mg/dL — ABNORMAL HIGH (ref 8–23)
CO2: 23 mmol/L (ref 22–32)
Calcium: 9.3 mg/dL (ref 8.9–10.3)
Chloride: 102 mmol/L (ref 98–111)
Creatinine: 1.37 mg/dL — ABNORMAL HIGH (ref 0.44–1.00)
GFR, Est AFR Am: 41 mL/min — ABNORMAL LOW (ref >60)
GFR, Estimated: 36 mL/min — ABNORMAL LOW (ref >60)
Glucose, Bld: 344 mg/dL — ABNORMAL HIGH (ref 70–99)
Potassium: 4.7 mmol/L (ref 3.5–5.1)
Sodium: 136 mmol/L (ref 135–145)
Total Bilirubin: 0.4 mg/dL (ref 0.3–1.2)
Total Protein: 6.6 g/dL (ref 6.5–8.1)

## 2018-07-06 LAB — CBC WITH DIFFERENTIAL (CANCER CENTER ONLY)
Abs Immature Granulocytes: 0.34 10*3/uL — ABNORMAL HIGH (ref 0.00–0.07)
Basophils Absolute: 0.1 10*3/uL (ref 0.0–0.1)
Basophils Relative: 1 %
Eosinophils Absolute: 0.1 10*3/uL (ref 0.0–0.5)
Eosinophils Relative: 1 %
HCT: 33.1 % — ABNORMAL LOW (ref 36.0–46.0)
Hemoglobin: 10.8 g/dL — ABNORMAL LOW (ref 12.0–15.0)
Immature Granulocytes: 4 %
Lymphocytes Relative: 13 %
Lymphs Abs: 1.1 10*3/uL (ref 0.7–4.0)
MCH: 29.5 pg (ref 26.0–34.0)
MCHC: 32.6 g/dL (ref 30.0–36.0)
MCV: 90.4 fL (ref 80.0–100.0)
Monocytes Absolute: 0.5 10*3/uL (ref 0.1–1.0)
Monocytes Relative: 6 %
Neutro Abs: 6.2 10*3/uL (ref 1.7–7.7)
Neutrophils Relative %: 75 %
Platelet Count: 263 10*3/uL (ref 150–400)
RBC: 3.66 MIL/uL — ABNORMAL LOW (ref 3.87–5.11)
RDW: 13.2 % (ref 11.5–15.5)
WBC Count: 8.3 10*3/uL (ref 4.0–10.5)
nRBC: 0 % (ref 0.0–0.2)

## 2018-07-06 MED ORDER — SODIUM CHLORIDE 0.9% FLUSH
10.0000 mL | Freq: Once | INTRAVENOUS | Status: AC
  Administered 2018-07-06: 10 mL
  Filled 2018-07-06: qty 10

## 2018-07-06 MED ORDER — HEPARIN SOD (PORK) LOCK FLUSH 100 UNIT/ML IV SOLN
500.0000 [IU] | Freq: Once | INTRAVENOUS | Status: AC
  Administered 2018-07-06: 500 [IU]
  Filled 2018-07-06: qty 5

## 2018-07-06 NOTE — Assessment & Plan Note (Signed)
She has poorly controlled diabetes I recommend close follow-up with primary care doctor for medical management

## 2018-07-06 NOTE — Assessment & Plan Note (Signed)
Her kidney function tests is stable. Continue close monitoring and risk factor modification 

## 2018-07-06 NOTE — Assessment & Plan Note (Signed)
She has completed maintenance obinutuzumab except for the last treatment that was omitted due to pandemic concerns Her insurance will not pay for screening/surveillance imaging study Currently, she is not symptomatic I recommend observation only with blood work, history and physical examination and she agreed I will see her back in 8 weeks for further follow-up

## 2018-07-06 NOTE — Assessment & Plan Note (Signed)
She has multifactorial anemia, likely anemia of chronic illness She is not symptomatic Observe only. 

## 2018-07-06 NOTE — Progress Notes (Signed)
[place] OFFICE PROGRESS NOTE  Patient Care Team: [name], Bill Salinas, MD as PCP - General (Family Medicine) Sharyne Peach, MD as Consulting Physician (Ophthalmology) Inda Castle, MD (Inactive) as Consulting Physician (Gastroenterology) Sypher, Herbie Baltimore, MD (Inactive) as Consulting Physician (Orthopedic Surgery) Jannette Spanner, MD as Referring Physician (Dermatology) Heath Lark, MD as Consulting Physician (Hematology and Oncology) Alphonsa Overall, MD as Consulting Physician (General Surgery)  ASSESSMENT & PLAN:  Marginal zone lymphoma She has completed maintenance obinutuzumab except for the last treatment that was omitted due to pandemic concerns Her insurance will not pay for screening/surveillance imaging study Currently, she is not symptomatic I recommend observation only with blood work, history and physical examination and she agreed I will see her back in 8 weeks for further follow-up  Type 2 diabetes mellitus with diabetic nephropathy She has poorly controlled diabetes I recommend close follow-up with primary care doctor for medical management  Chronic kidney disease (CKD), stage III (moderate) Her kidney function tests is stable. Continue close monitoring and risk factor modification  Anemia in neoplastic disease She has multifactorial anemia, likely anemia of chronic illness She is not symptomatic Observe only.   Orders Placed This Encounter  Procedures  . Comprehensive metabolic panel    Standing Status:   Standing    Number of Occurrences:   22    Standing Expiration Date:   07/06/2019  . CBC with Differential/Platelet    Standing Status:   Standing    Number of Occurrences:   22    Standing Expiration Date:   07/06/2019    INTERVAL HISTORY: Please see below for problem oriented charting. She returns for further follow-up She denies recent infection, fever or chills No new lymphadenopathy Her energy level is fair.  SUMMARY OF  ONCOLOGIC HISTORY: Oncology History   Marginal zone lymphoma, with lymph node and bone marrow involvement along with splenomegaly   Primary site: Lymphoid Neoplasms   Staging method: AJCC 6th Edition   Clinical: Stage IV signed by Heath Lark, MD on 09/02/2013  7:38 PM   Summary: Stage IV       Marginal zone lymphoma ([place])   07/29/2013 Imaging    CT scan shows splenomegaly and abnormal lymphadenopathy.    08/13/2013 Imaging    PET CT scan confirmed lymphadenopathy and splenomegaly, those areas are PET avid    08/23/2013 Surgery    [id number] left axillary lymph node biopsy confirmed a low-grade lymphoma.    08/27/2013 Bone Marrow Biopsy    Bone marrow biopsy confirmed lymphoma.[id number]    09/08/2013 - 09/29/2013 Chemotherapy    She completed 4 cycles of rituximab with resolution of splenomegaly.    12/29/2013 Imaging    PET scan show complete response.    07/11/2014 Imaging    PET scan showed recurrent disease    07/19/2014 - 08/09/2014 Chemotherapy    She completed 4 cycles of rituximab with resolution of splenomegaly    10/12/2014 Imaging    Repeat PET/CT scan showed near complete response to treatment.    10/13/2014 - 02/09/2015 Chemotherapy    She is placed on rituximab maintenance therapy.    03/29/2015 Imaging    PET CT showed possible mild progression of splenomegaly    04/26/2015 - 02/22/2016 Chemotherapy    She started taking Ibrutinib    04/28/2015 Adverse Reaction    Treatment was placed temporarily on hold due to severe diarrhea and resumed at reduced dose    07/03/2015 PET scan    No hypermetabolic adenopathy  in the neck, chest, abdomen or pelvis.2. Spleen is not hypermetabolic and appears slightly less prominent size.    02/06/2016 PET scan    Hypermetabolic LEFT axillary lymph nodes. Concern for HIGH-GRADE LYMPHOMA LOCALIZED RECURRENCE. 2. Prominent spleen without hypermetabolic activity. No change in size from comparison. 3. No additional hypermetabolic adenopathy on  scan.    02/19/2016 Procedure    Technically successful ultrasound-guided core biopsy, left axillary adenopathy.    02/19/2016 Pathology Results    Lymph node, needle/core biopsy, left axilla - ATYPICAL LYMPHOID PROLIFERATION SUSPICIOUS FOR LYMPHOMA. - SEE COMMENT. Microscopic Comment The sections show multiple very small and skinny fragments of lymph nodal tissue displaying areas of lymphoid expansion by a population of small to medium size lymphocytes with round to irregular nuclei, scanty to moderately abundant cytoplasm, dense chromatin and inconspicuous nucleoli. This is associated with scattering of "naked" germinal centers with abundant tingible body macrophages. To further evaluate this process, flow cytometric analysis was performed and shows a minor population of monoclonal B cells expressing pan B-cell antigens including CD20 with associated kappa light chain restriction. In addition, immunohistochemical stains were performed including CD20, CD79a, CD3, CD5, CD10, BCL-2, CD21 with appropriate controls. The stains show a mixture of T and B cells with slight predominance of B cells. CD21 highlights numerous areas with dendritic networks, correlating with the presence of germinal centers. CD10 shows focal positivity likely in germinal centers. BCL-2 is diffusely positive except in areas of apparent germinal centers. The overall features are atypical and suspicious for involvement by a B-cell lymphoproliferative process, particularly given the B-cell monoclonality by flow cytometry. However, morphologic evaluation is extremely limited due to small biopsy fragments and additional material (excisional biopsy) is strongly recommended for further evaluation. (BNS:ecj 02/21/2016)     03/04/2016 Procedure    Placement of single lumen port a cath via right internal jugular vein. The catheter tip lies at the cavo-atrial junction. A power injectable port a cath was placed and is ready for immediate use.     03/07/2016 - 05/31/2016 Chemotherapy    She received treatment with bendamustine and Gazyva    05/27/2016 PET scan    1. No evidence of active lymphoma on whole-body scan. 2. Resolution of metabolic activity previously identified in the LEFT axilla. 3. Small amount free fluid the pelvis.    03/07/2017 PET scan    1. No findings of active lymphoma in the chest, abdomen, or pelvis. 2. Other imaging findings of potential clinical significance: Aortic Atherosclerosis (ICD10-I70.0). Coronary atherosclerosis. Chronic paranasal sinusitis. Moderately distended gallbladder, chronic     REVIEW OF SYSTEMS:   Constitutional: Denies fevers, chills or abnormal weight loss Eyes: Denies blurriness of vision Ears, nose, mouth, throat, and face: Denies mucositis or sore throat Respiratory: Denies cough, dyspnea or wheezes Cardiovascular: Denies palpitation, chest discomfort or lower extremity swelling Gastrointestinal:  Denies nausea, heartburn or change in bowel habits Skin: Denies abnormal skin rashes Lymphatics: Denies new lymphadenopathy or easy bruising Neurological:Denies numbness, tingling or new weaknesses Behavioral/Psych: Mood is stable, no new changes  All other systems were reviewed with the patient and are negative.  I have reviewed the past medical history, past surgical history, social history and family history with the patient and they are unchanged from previous note.  ALLERGIES:  is allergic to aspirin; capoten [captopril]; levemir [insulin detemir]; micronase [glyburide]; onglyza [saxagliptin]; statins; and zocor [simvastatin].  MEDICATIONS:  Current Outpatient Medications  Medication Sig Dispense Refill  . acyclovir (ZOVIRAX) 400 MG tablet Take 1 tablet by  mouth once daily 30 tablet 9  . amLODipine (NORVASC) 10 MG tablet TAKE 1 TABLET BY MOUTH AT BEDTIME 90 tablet 3  . aspirin 81 MG tablet Take 40.5 mg by mouth 3 (three) times a week.    . Calcium Carbonate-Vit D-Min (CALCIUM 1200  PO) Take 1 tablet by mouth daily. Per patient takes in am and pm    . cholecalciferol (VITAMIN D) 1000 UNITS tablet Take 1,000 Units by mouth daily.     . enalapril (VASOTEC) 20 MG tablet Take 20 mg by mouth every morning.    . fish oil-omega-3 fatty acids 1000 MG capsule Take 1 g by mouth daily.     Marland Kitchen glimepiride (AMARYL) 4 MG tablet Take 4 mg by mouth daily with breakfast. Per patient takes in am and pm.    . glucose blood test strip Use as instructed 100 each 12  . lidocaine-prilocaine (EMLA) cream Apply 1 application topically as needed. 30 g 3  . loperamide (IMODIUM) 2 MG capsule Take by mouth as needed for diarrhea or loose stools.    . Magnesium 500 MG CAPS Take 500 mg by mouth daily.     . metFORMIN (GLUCOPHAGE) 1000 MG tablet Take 1,000 mg by mouth 2 (two) times daily with a meal.     . Misc Natural Products (LUTEIN 20 PO) Take by mouth daily.    . Multiple Vitamins-Minerals (CENTRUM CARDIO) TABS Take 1 tablet by mouth daily.    . Multiple Vitamins-Minerals (ICAPS) CAPS Take by mouth daily.     No current facility-administered medications for this visit.     PHYSICAL EXAMINATION: ECOG PERFORMANCE STATUS: 1 - Symptomatic but completely ambulatory  Vitals:   07/06/18 1054  BP: (!) 153/68  Pulse: 79  Resp: 18  Temp: 98.7 F (37.1 C)  SpO2: 100%   Filed Weights   07/06/18 1054  Weight: 163 lb 3.2 oz (74 kg)    GENERAL:alert, no distress and comfortable SKIN: skin color, texture, turgor are normal, no rashes or significant lesions EYES: normal, Conjunctiva are pink and non-injected, sclera clear OROPHARYNX:no exudate, no erythema and lips, buccal mucosa, and tongue normal  NECK: supple, thyroid normal size, non-tender, without nodularity LYMPH:  no palpable lymphadenopathy in the cervical, axillary or inguinal LUNGS: clear to auscultation and percussion with normal breathing effort HEART: regular rate & rhythm and no murmurs and no lower extremity edema ABDOMEN:abdomen  soft, non-tender and normal bowel sounds Musculoskeletal:no cyanosis of digits and no clubbing  NEURO: alert & oriented x 3 with fluent speech, no focal motor/sensory deficits  LABORATORY DATA:  I have reviewed the data as listed    Component Value Date/Time   NA 136 07/06/2018 0959   NA 136 01/07/2017 1004   K 4.7 07/06/2018 0959   K 4.5 01/07/2017 1004   CL 102 07/06/2018 0959   CO2 23 07/06/2018 0959   CO2 23 01/07/2017 1004   GLUCOSE 344 (H) 07/06/2018 0959   GLUCOSE 217 (H) 01/07/2017 1004   BUN 25 (H) 07/06/2018 0959   BUN 23.3 01/07/2017 1004   CREATININE 1.37 (H) 07/06/2018 0959   CREATININE 1.2 (H) 01/07/2017 1004   CALCIUM 9.3 07/06/2018 0959   CALCIUM 9.4 01/07/2017 1004   PROT 6.6 07/06/2018 0959   PROT 6.7 01/07/2017 1004   ALBUMIN 3.8 07/06/2018 0959   ALBUMIN 3.9 01/07/2017 1004   AST 15 07/06/2018 0959   AST 20 01/07/2017 1004   ALT 10 07/06/2018 0959   ALT 14 01/07/2017 1004  ALKPHOS 76 07/06/2018 0959   ALKPHOS 70 01/07/2017 1004   BILITOT 0.4 07/06/2018 0959   BILITOT 0.49 01/07/2017 1004   GFRNONAA 36 (L) 07/06/2018 0959   GFRNONAA 47 (L) 05/18/2013 1256   GFRAA 41 (L) 07/06/2018 0959   GFRAA 55 (L) 05/18/2013 1256    No results found for: SPEP, UPEP  Lab Results  Component Value Date   WBC 8.3 07/06/2018   NEUTROABS 6.2 07/06/2018   HGB 10.8 (L) 07/06/2018   HCT 33.1 (L) 07/06/2018   MCV 90.4 07/06/2018   PLT 263 07/06/2018      Chemistry      Component Value Date/Time   NA 136 07/06/2018 0959   NA 136 01/07/2017 1004   K 4.7 07/06/2018 0959   K 4.5 01/07/2017 1004   CL 102 07/06/2018 0959   CO2 23 07/06/2018 0959   CO2 23 01/07/2017 1004   BUN 25 (H) 07/06/2018 0959   BUN 23.3 01/07/2017 1004   CREATININE 1.37 (H) 07/06/2018 0959   CREATININE 1.2 (H) 01/07/2017 1004   GLU 202 (H) 08/09/2014 1000      Component Value Date/Time   CALCIUM 9.3 07/06/2018 0959   CALCIUM 9.4 01/07/2017 1004   ALKPHOS 76 07/06/2018 0959    ALKPHOS 70 01/07/2017 1004   AST 15 07/06/2018 0959   AST 20 01/07/2017 1004   ALT 10 07/06/2018 0959   ALT 14 01/07/2017 1004   BILITOT 0.4 07/06/2018 0959   BILITOT 0.49 01/07/2017 1004       All questions were answered. The patient knows to call the clinic with any problems, questions or concerns. No barriers to learning was detected.  I spent 15 minutes counseling the patient face to face. The total time spent in the appointment was 20 minutes and more than 50% was on counseling and review of test results  Heath Lark, MD 07/06/2018 1:32 PM

## 2018-07-06 NOTE — Telephone Encounter (Signed)
Called and left below message on both numbers. Ask her to call the office back if needed or for questions.

## 2018-07-06 NOTE — Telephone Encounter (Signed)
-----   Message from Heath Lark, MD sent at 07/06/2018 11:24 AM EDT ----- Regarding: high blood sugar Pls call her and let her know blood sugar is over 300 Please advise her to follow with PCP for management

## 2018-07-07 ENCOUNTER — Telehealth: Payer: Self-pay | Admitting: Hematology and Oncology

## 2018-07-07 NOTE — Telephone Encounter (Signed)
Scheduled appt per sch msg. Called and left msg.  °

## 2018-07-22 ENCOUNTER — Other Ambulatory Visit (HOSPITAL_BASED_OUTPATIENT_CLINIC_OR_DEPARTMENT_OTHER): Payer: Self-pay | Admitting: Family Medicine

## 2018-07-22 DIAGNOSIS — Z1231 Encounter for screening mammogram for malignant neoplasm of breast: Secondary | ICD-10-CM

## 2018-07-28 ENCOUNTER — Encounter (HOSPITAL_BASED_OUTPATIENT_CLINIC_OR_DEPARTMENT_OTHER): Payer: Self-pay

## 2018-07-28 ENCOUNTER — Other Ambulatory Visit: Payer: Self-pay

## 2018-07-28 ENCOUNTER — Ambulatory Visit (HOSPITAL_BASED_OUTPATIENT_CLINIC_OR_DEPARTMENT_OTHER)
Admission: RE | Admit: 2018-07-28 | Discharge: 2018-07-28 | Disposition: A | Discharge status: Home / Self Care | Payer: Medicare Other | Source: Ambulatory Visit | Attending: Family Medicine | Admitting: Family Medicine

## 2018-07-28 DIAGNOSIS — Z1231 Encounter for screening mammogram for malignant neoplasm of breast: Secondary | ICD-10-CM | POA: Diagnosis present

## 2018-08-05 ENCOUNTER — Ambulatory Visit: Payer: Medicare Other | Admitting: Podiatry

## 2018-08-05 ENCOUNTER — Encounter: Payer: Self-pay | Admitting: Podiatry

## 2018-08-05 ENCOUNTER — Other Ambulatory Visit: Payer: Self-pay

## 2018-08-05 DIAGNOSIS — M79674 Pain in right toe(s): Secondary | ICD-10-CM

## 2018-08-05 DIAGNOSIS — M79675 Pain in left toe(s): Secondary | ICD-10-CM | POA: Diagnosis not present

## 2018-08-05 DIAGNOSIS — E1121 Type 2 diabetes mellitus with diabetic nephropathy: Secondary | ICD-10-CM

## 2018-08-05 DIAGNOSIS — B351 Tinea unguium: Secondary | ICD-10-CM

## 2018-08-05 NOTE — Progress Notes (Signed)
Complaint:  Visit Type: Patient returns to my office for continued preventative foot care services. Complaint: Patient states" my nails have grown long and thick and become painful to walk and wear shoes" Patient has been diagnosed with DM with no foot complications. The patient presents for preventative foot care services. No changes to ROS  Podiatric Exam: Vascular: dorsalis pedis and posterior tibial pulses are palpable bilateral. Capillary return is immediate. Temperature gradient is WNL. Skin turgor WNL  Sensorium: Normal Semmes Weinstein monofilament test. Normal tactile sensation bilaterally. Nail Exam: Pt has thick disfigured discolored nails with subungual debris noted bilateral entire nail hallux through fifth toenails Ulcer Exam: There is no evidence of ulcer or pre-ulcerative changes or infection. Orthopedic Exam: Muscle tone and strength are WNL. No limitations in general ROM. No crepitus or effusions noted. Foot type and digits show no abnormalities. HAV  B/L Skin: No Porokeratosis. No infection or ulcers  Diagnosis:  Onychomycosis, , Pain in right toe, pain in left toes  Treatment & Plan Procedures and Treatment: Consent by patient was obtained for treatment procedures.   Debridement of mycotic and hypertrophic toenails, 1 through 5 bilateral and clearing of subungual debris. No ulceration, no infection noted.  Return Visit-Office Procedure: Patient instructed to return to the office for a follow up visit 10 weeks  for continued evaluation and treatment.    Gardiner Barefoot DPM

## 2018-08-07 ENCOUNTER — Other Ambulatory Visit: Payer: Self-pay | Admitting: Hematology and Oncology

## 2018-08-07 DIAGNOSIS — I10 Essential (primary) hypertension: Secondary | ICD-10-CM

## 2018-08-31 ENCOUNTER — Other Ambulatory Visit: Payer: Self-pay

## 2018-08-31 ENCOUNTER — Inpatient Hospital Stay: Payer: Medicare Other

## 2018-08-31 ENCOUNTER — Inpatient Hospital Stay: Payer: Medicare Other | Attending: Hematology and Oncology | Admitting: Hematology and Oncology

## 2018-08-31 ENCOUNTER — Telehealth: Payer: Self-pay | Admitting: Hematology and Oncology

## 2018-08-31 ENCOUNTER — Encounter: Payer: Self-pay | Admitting: Hematology and Oncology

## 2018-08-31 DIAGNOSIS — C8580 Other specified types of non-Hodgkin lymphoma, unspecified site: Secondary | ICD-10-CM

## 2018-08-31 DIAGNOSIS — D630 Anemia in neoplastic disease: Secondary | ICD-10-CM

## 2018-08-31 DIAGNOSIS — N183 Chronic kidney disease, stage 3 unspecified: Secondary | ICD-10-CM

## 2018-08-31 DIAGNOSIS — D649 Anemia, unspecified: Secondary | ICD-10-CM | POA: Insufficient documentation

## 2018-08-31 DIAGNOSIS — Z9221 Personal history of antineoplastic chemotherapy: Secondary | ICD-10-CM | POA: Diagnosis not present

## 2018-08-31 DIAGNOSIS — Z95828 Presence of other vascular implants and grafts: Secondary | ICD-10-CM

## 2018-08-31 DIAGNOSIS — C8588 Other specified types of non-Hodgkin lymphoma, lymph nodes of multiple sites: Secondary | ICD-10-CM | POA: Insufficient documentation

## 2018-08-31 DIAGNOSIS — Z7982 Long term (current) use of aspirin: Secondary | ICD-10-CM | POA: Diagnosis not present

## 2018-08-31 DIAGNOSIS — Z79899 Other long term (current) drug therapy: Secondary | ICD-10-CM | POA: Diagnosis not present

## 2018-08-31 DIAGNOSIS — Z7984 Long term (current) use of oral hypoglycemic drugs: Secondary | ICD-10-CM | POA: Insufficient documentation

## 2018-08-31 LAB — COMPREHENSIVE METABOLIC PANEL
ALT: 14 U/L (ref 0–44)
AST: 17 U/L (ref 15–41)
Albumin: 3.5 g/dL (ref 3.5–5.0)
Alkaline Phosphatase: 72 U/L (ref 38–126)
Anion gap: 8 (ref 5–15)
BUN: 22 mg/dL (ref 8–23)
CO2: 24 mmol/L (ref 22–32)
Calcium: 9 mg/dL (ref 8.9–10.3)
Chloride: 107 mmol/L (ref 98–111)
Creatinine, Ser: 1.31 mg/dL — ABNORMAL HIGH (ref 0.44–1.00)
GFR calc Af Amer: 44 mL/min — ABNORMAL LOW (ref >60)
GFR calc non Af Amer: 38 mL/min — ABNORMAL LOW (ref >60)
Glucose, Bld: 257 mg/dL — ABNORMAL HIGH (ref 70–99)
Potassium: 4.7 mmol/L (ref 3.5–5.1)
Sodium: 139 mmol/L (ref 135–145)
Total Bilirubin: 0.4 mg/dL (ref 0.3–1.2)
Total Protein: 6.2 g/dL — ABNORMAL LOW (ref 6.5–8.1)

## 2018-08-31 LAB — CBC WITH DIFFERENTIAL/PLATELET
Abs Immature Granulocytes: 0.2 10*3/uL — ABNORMAL HIGH (ref 0.00–0.07)
Basophils Absolute: 0 10*3/uL (ref 0.0–0.1)
Basophils Relative: 1 %
Eosinophils Absolute: 0.1 10*3/uL (ref 0.0–0.5)
Eosinophils Relative: 2 %
HCT: 31.6 % — ABNORMAL LOW (ref 36.0–46.0)
Hemoglobin: 10.5 g/dL — ABNORMAL LOW (ref 12.0–15.0)
Immature Granulocytes: 3 %
Lymphocytes Relative: 17 %
Lymphs Abs: 1.1 10*3/uL (ref 0.7–4.0)
MCH: 29.6 pg (ref 26.0–34.0)
MCHC: 33.2 g/dL (ref 30.0–36.0)
MCV: 89 fL (ref 80.0–100.0)
Monocytes Absolute: 0.6 10*3/uL (ref 0.1–1.0)
Monocytes Relative: 9 %
Neutro Abs: 4.5 10*3/uL (ref 1.7–7.7)
Neutrophils Relative %: 68 %
Platelets: 230 10*3/uL (ref 150–400)
RBC: 3.55 MIL/uL — ABNORMAL LOW (ref 3.87–5.11)
RDW: 13.2 % (ref 11.5–15.5)
WBC: 6.5 10*3/uL (ref 4.0–10.5)
nRBC: 0 % (ref 0.0–0.2)

## 2018-08-31 MED ORDER — HEPARIN SOD (PORK) LOCK FLUSH 100 UNIT/ML IV SOLN
500.0000 [IU] | Freq: Once | INTRAVENOUS | Status: AC
  Administered 2018-08-31: 500 [IU]
  Filled 2018-08-31: qty 5

## 2018-08-31 MED ORDER — SODIUM CHLORIDE 0.9% FLUSH
10.0000 mL | Freq: Once | INTRAVENOUS | Status: AC
  Administered 2018-08-31: 10 mL
  Filled 2018-08-31: qty 10

## 2018-08-31 NOTE — Assessment & Plan Note (Signed)
She has multifactorial anemia, likely anemia of chronic illness She is not symptomatic Observe only. 

## 2018-08-31 NOTE — Progress Notes (Signed)
[place] OFFICE PROGRESS NOTE  Patient Care Team: [name], Bill Salinas, MD as PCP - General (Family Medicine) Sharyne Peach, MD as Consulting Physician (Ophthalmology) Inda Castle, MD (Inactive) as Consulting Physician (Gastroenterology) Sypher, Herbie Baltimore, MD (Inactive) as Consulting Physician (Orthopedic Surgery) Jannette Spanner, MD as Referring Physician (Dermatology) Heath Lark, MD as Consulting Physician (Hematology and Oncology) Alphonsa Overall, MD as Consulting Physician (General Surgery)  ASSESSMENT & PLAN:  Marginal zone lymphoma She has completed maintenance obinutuzumab except for the last treatment that was omitted due to pandemic concerns Her insurance will not pay for screening/surveillance imaging study Currently, she is not symptomatic I recommend observation only with blood work, history and physical examination and she agreed I will see her back in 8 weeks for further follow-up  Anemia in neoplastic disease She has multifactorial anemia, likely anemia of chronic illness She is not symptomatic Observe only.  Chronic kidney disease (CKD), stage III (moderate) Her kidney function tests is stable. Continue close monitoring and risk factor modification   No orders of the defined types were placed in this encounter.   INTERVAL HISTORY: Please see below for problem oriented charting. She returns for further follow-up Denies recent infection, fever or chills No new lymphadenopathy  SUMMARY OF ONCOLOGIC HISTORY: Oncology History Overview Note  Marginal zone lymphoma, with lymph node and bone marrow involvement along with splenomegaly   Primary site: Lymphoid Neoplasms   Staging method: AJCC 6th Edition   Clinical: Stage IV signed by Heath Lark, MD on 09/02/2013  7:38 PM   Summary: Stage IV     Marginal zone lymphoma ([place])  07/29/2013 Imaging   CT scan shows splenomegaly and abnormal lymphadenopathy.   08/13/2013 Imaging   PET CT scan  confirmed lymphadenopathy and splenomegaly, those areas are PET avid   08/23/2013 Surgery   [id number] left axillary lymph node biopsy confirmed a low-grade lymphoma.   08/27/2013 Bone Marrow Biopsy   Bone marrow biopsy confirmed lymphoma.[id number]   09/08/2013 - 09/29/2013 Chemotherapy   She completed 4 cycles of rituximab with resolution of splenomegaly.   12/29/2013 Imaging   PET scan show complete response.   07/11/2014 Imaging   PET scan showed recurrent disease   07/19/2014 - 08/09/2014 Chemotherapy   She completed 4 cycles of rituximab with resolution of splenomegaly   10/12/2014 Imaging   Repeat PET/CT scan showed near complete response to treatment.   10/13/2014 - 02/09/2015 Chemotherapy   She is placed on rituximab maintenance therapy.   03/29/2015 Imaging   PET CT showed possible mild progression of splenomegaly   04/26/2015 - 02/22/2016 Chemotherapy   She started taking Ibrutinib   04/28/2015 Adverse Reaction   Treatment was placed temporarily on hold due to severe diarrhea and resumed at reduced dose   07/03/2015 PET scan   No hypermetabolic adenopathy in the neck, chest, abdomen or pelvis.2. Spleen is not hypermetabolic and appears slightly less prominent size.   02/06/2016 PET scan   Hypermetabolic LEFT axillary lymph nodes. Concern for HIGH-GRADE LYMPHOMA LOCALIZED RECURRENCE. 2. Prominent spleen without hypermetabolic activity. No change in size from comparison. 3. No additional hypermetabolic adenopathy on scan.   02/19/2016 Procedure   Technically successful ultrasound-guided core biopsy, left axillary adenopathy.   02/19/2016 Pathology Results   Lymph node, needle/core biopsy, left axilla - ATYPICAL LYMPHOID PROLIFERATION SUSPICIOUS FOR LYMPHOMA. - SEE COMMENT. Microscopic Comment The sections show multiple very small and skinny fragments of lymph nodal tissue displaying areas of lymphoid expansion by a population of small to  medium size lymphocytes with round to  irregular nuclei, scanty to moderately abundant cytoplasm, dense chromatin and inconspicuous nucleoli. This is associated with scattering of "naked" germinal centers with abundant tingible body macrophages. To further evaluate this process, flow cytometric analysis was performed and shows a minor population of monoclonal B cells expressing pan B-cell antigens including CD20 with associated kappa light chain restriction. In addition, immunohistochemical stains were performed including CD20, CD79a, CD3, CD5, CD10, BCL-2, CD21 with appropriate controls. The stains show a mixture of T and B cells with slight predominance of B cells. CD21 highlights numerous areas with dendritic networks, correlating with the presence of germinal centers. CD10 shows focal positivity likely in germinal centers. BCL-2 is diffusely positive except in areas of apparent germinal centers. The overall features are atypical and suspicious for involvement by a B-cell lymphoproliferative process, particularly given the B-cell monoclonality by flow cytometry. However, morphologic evaluation is extremely limited due to small biopsy fragments and additional material (excisional biopsy) is strongly recommended for further evaluation. (BNS:ecj 02/21/2016)    03/04/2016 Procedure   Placement of single lumen port a cath via right internal jugular vein. The catheter tip lies at the cavo-atrial junction. A power injectable port a cath was placed and is ready for immediate use.   03/07/2016 - 05/31/2016 Chemotherapy   She received treatment with bendamustine and Gazyva   05/27/2016 PET scan   1. No evidence of active lymphoma on whole-body scan. 2. Resolution of metabolic activity previously identified in the LEFT axilla. 3. Small amount free fluid the pelvis.   03/07/2017 PET scan   1. No findings of active lymphoma in the chest, abdomen, or pelvis. 2. Other imaging findings of potential clinical significance: Aortic Atherosclerosis (ICD10-I70.0).  Coronary atherosclerosis. Chronic paranasal sinusitis. Moderately distended gallbladder, chronic     REVIEW OF SYSTEMS:   Constitutional: Denies fevers, chills or abnormal weight loss Eyes: Denies blurriness of vision Ears, nose, mouth, throat, and face: Denies mucositis or sore throat Respiratory: Denies cough, dyspnea or wheezes Cardiovascular: Denies palpitation, chest discomfort or lower extremity swelling Gastrointestinal:  Denies nausea, heartburn or change in bowel habits Skin: Denies abnormal skin rashes Lymphatics: Denies new lymphadenopathy or easy bruising Neurological:Denies numbness, tingling or new weaknesses Behavioral/Psych: Mood is stable, no new changes  All other systems were reviewed with the patient and are negative.  I have reviewed the past medical history, past surgical history, social history and family history with the patient and they are unchanged from previous note.  ALLERGIES:  is allergic to aspirin; capoten [captopril]; levemir [insulin detemir]; micronase [glyburide]; onglyza [saxagliptin]; statins; and zocor [simvastatin].  MEDICATIONS:  Current Outpatient Medications  Medication Sig Dispense Refill  . acyclovir (ZOVIRAX) 400 MG tablet Take 1 tablet by mouth once daily 30 tablet 9  . amLODipine (NORVASC) 10 MG tablet TAKE 1 TABLET BY MOUTH AT BEDTIME 90 tablet 0  . aspirin 81 MG tablet Take 40.5 mg by mouth 3 (three) times a week.    . Calcium Carbonate-Vit D-Min (CALCIUM 1200 PO) Take 1 tablet by mouth daily. Per patient takes in am and pm    . cholecalciferol (VITAMIN D) 1000 UNITS tablet Take 1,000 Units by mouth daily.     . enalapril (VASOTEC) 20 MG tablet Take 20 mg by mouth every morning.    . fish oil-omega-3 fatty acids 1000 MG capsule Take 1 g by mouth daily.     Marland Kitchen glimepiride (AMARYL) 4 MG tablet Take 4 mg by mouth daily with breakfast. Per  patient takes in am and pm.    . glucose blood test strip Use as instructed 100 each 12  .  lidocaine-prilocaine (EMLA) cream Apply 1 application topically as needed. 30 g 3  . loperamide (IMODIUM) 2 MG capsule Take by mouth as needed for diarrhea or loose stools.    . Magnesium 500 MG CAPS Take 500 mg by mouth daily.     . metFORMIN (GLUCOPHAGE) 1000 MG tablet Take 1,000 mg by mouth 2 (two) times daily with a meal.     . Misc Natural Products (LUTEIN 20 PO) Take by mouth daily.    . Multiple Vitamins-Minerals (CENTRUM CARDIO) TABS Take 1 tablet by mouth daily.    . Multiple Vitamins-Minerals (ICAPS) CAPS Take by mouth daily.     No current facility-administered medications for this visit.     PHYSICAL EXAMINATION: ECOG PERFORMANCE STATUS: 0 - Asymptomatic  Vitals:   08/31/18 1001  BP: (!) 145/64  Pulse: 84  Resp: 18  Temp: 98.7 F (37.1 C)  SpO2: 98%   Filed Weights   08/31/18 1001  Weight: 160 lb 12.8 oz (72.9 kg)    GENERAL:alert, no distress and comfortable SKIN: skin color, texture, turgor are normal, no rashes or significant lesions EYES: normal, Conjunctiva are pink and non-injected, sclera clear OROPHARYNX:no exudate, no erythema and lips, buccal mucosa, and tongue normal  NECK: supple, thyroid normal size, non-tender, without nodularity LYMPH:  no palpable lymphadenopathy in the cervical, axillary or inguinal LUNGS: clear to auscultation and percussion with normal breathing effort HEART: regular rate & rhythm and no murmurs and no lower extremity edema ABDOMEN:abdomen soft, non-tender and normal bowel sounds Musculoskeletal:no cyanosis of digits and no clubbing  NEURO: alert & oriented x 3 with fluent speech, no focal motor/sensory deficits  LABORATORY DATA:  I have reviewed the data as listed    Component Value Date/Time   NA 139 08/31/2018 0910   NA 136 01/07/2017 1004   K 4.7 08/31/2018 0910   K 4.5 01/07/2017 1004   CL 107 08/31/2018 0910   CO2 24 08/31/2018 0910   CO2 23 01/07/2017 1004   GLUCOSE 257 (H) 08/31/2018 0910   GLUCOSE 217 (H)  01/07/2017 1004   BUN 22 08/31/2018 0910   BUN 23.3 01/07/2017 1004   CREATININE 1.31 (H) 08/31/2018 0910   CREATININE 1.37 (H) 07/06/2018 0959   CREATININE 1.2 (H) 01/07/2017 1004   CALCIUM 9.0 08/31/2018 0910   CALCIUM 9.4 01/07/2017 1004   PROT 6.2 (L) 08/31/2018 0910   PROT 6.7 01/07/2017 1004   ALBUMIN 3.5 08/31/2018 0910   ALBUMIN 3.9 01/07/2017 1004   AST 17 08/31/2018 0910   AST 15 07/06/2018 0959   AST 20 01/07/2017 1004   ALT 14 08/31/2018 0910   ALT 10 07/06/2018 0959   ALT 14 01/07/2017 1004   ALKPHOS 72 08/31/2018 0910   ALKPHOS 70 01/07/2017 1004   BILITOT 0.4 08/31/2018 0910   BILITOT 0.4 07/06/2018 0959   BILITOT 0.49 01/07/2017 1004   GFRNONAA 38 (L) 08/31/2018 0910   GFRNONAA 36 (L) 07/06/2018 0959   GFRNONAA 47 (L) 05/18/2013 1256   GFRAA 44 (L) 08/31/2018 0910   GFRAA 41 (L) 07/06/2018 0959   GFRAA 55 (L) 05/18/2013 1256    No results found for: SPEP, UPEP  Lab Results  Component Value Date   WBC 6.5 08/31/2018   NEUTROABS 4.5 08/31/2018   HGB 10.5 (L) 08/31/2018   HCT 31.6 (L) 08/31/2018   MCV 89.0 08/31/2018  PLT 230 08/31/2018      Chemistry      Component Value Date/Time   NA 139 08/31/2018 0910   NA 136 01/07/2017 1004   K 4.7 08/31/2018 0910   K 4.5 01/07/2017 1004   CL 107 08/31/2018 0910   CO2 24 08/31/2018 0910   CO2 23 01/07/2017 1004   BUN 22 08/31/2018 0910   BUN 23.3 01/07/2017 1004   CREATININE 1.31 (H) 08/31/2018 0910   CREATININE 1.37 (H) 07/06/2018 0959   CREATININE 1.2 (H) 01/07/2017 1004   GLU 202 (H) 08/09/2014 1000      Component Value Date/Time   CALCIUM 9.0 08/31/2018 0910   CALCIUM 9.4 01/07/2017 1004   ALKPHOS 72 08/31/2018 0910   ALKPHOS 70 01/07/2017 1004   AST 17 08/31/2018 0910   AST 15 07/06/2018 0959   AST 20 01/07/2017 1004   ALT 14 08/31/2018 0910   ALT 10 07/06/2018 0959   ALT 14 01/07/2017 1004   BILITOT 0.4 08/31/2018 0910   BILITOT 0.4 07/06/2018 0959   BILITOT 0.49 01/07/2017 1004        All questions were answered. The patient knows to call the clinic with any problems, questions or concerns. No barriers to learning was detected.  I spent 10 minutes counseling the patient face to face. The total time spent in the appointment was 15 minutes and more than 50% was on counseling and review of test results  Heath Lark, MD 08/31/2018 1:43 PM

## 2018-08-31 NOTE — Telephone Encounter (Signed)
Scheduled appointment per 08/03 los, patient is notified and a calender will be mailed.

## 2018-08-31 NOTE — Assessment & Plan Note (Signed)
Her kidney function tests is stable. Continue close monitoring and risk factor modification 

## 2018-08-31 NOTE — Assessment & Plan Note (Signed)
She has completed maintenance obinutuzumab except for the last treatment that was omitted due to pandemic concerns Her insurance will not pay for screening/surveillance imaging study Currently, she is not symptomatic I recommend observation only with blood work, history and physical examination and she agreed I will see her back in 8 weeks for further follow-up

## 2018-09-17 MED ORDER — DENOSUMAB 60 MG/ML ~~LOC~~ SOSY
PREFILLED_SYRINGE | SUBCUTANEOUS | Status: AC
  Filled 2018-09-17: qty 1

## 2018-10-16 ENCOUNTER — Ambulatory Visit (INDEPENDENT_AMBULATORY_CARE_PROVIDER_SITE_OTHER): Payer: Medicare Other | Admitting: Podiatry

## 2018-10-16 ENCOUNTER — Encounter: Payer: Self-pay | Admitting: Podiatry

## 2018-10-16 ENCOUNTER — Other Ambulatory Visit: Payer: Self-pay

## 2018-10-16 DIAGNOSIS — M79674 Pain in right toe(s): Secondary | ICD-10-CM | POA: Diagnosis not present

## 2018-10-16 DIAGNOSIS — B351 Tinea unguium: Secondary | ICD-10-CM

## 2018-10-16 DIAGNOSIS — E1121 Type 2 diabetes mellitus with diabetic nephropathy: Secondary | ICD-10-CM | POA: Diagnosis not present

## 2018-10-16 DIAGNOSIS — M79675 Pain in left toe(s): Secondary | ICD-10-CM | POA: Diagnosis not present

## 2018-10-16 NOTE — Progress Notes (Signed)
Complaint:  Visit Type: Patient returns to my office for continued preventative foot care services. Complaint: Patient states" my nails have grown long and thick and become painful to walk and wear shoes" Patient has been diagnosed with DM with no foot complications. The patient presents for preventative foot care services. No changes to ROS  Podiatric Exam: Vascular: dorsalis pedis and posterior tibial pulses are palpable bilateral. Capillary return is immediate. Temperature gradient is WNL. Skin turgor WNL  Sensorium: Normal Semmes Weinstein monofilament test. Normal tactile sensation bilaterally. Nail Exam: Pt has thick disfigured discolored nails with subungual debris noted bilateral entire nail hallux through fifth toenails Ulcer Exam: There is no evidence of ulcer or pre-ulcerative changes or infection. Orthopedic Exam: Muscle tone and strength are WNL. No limitations in general ROM. No crepitus or effusions noted. Foot type and digits show no abnormalities. HAV  B/L Skin: No Porokeratosis. No infection or ulcers  Diagnosis:  Onychomycosis, , Pain in right toe, pain in left toes  Treatment & Plan Procedures and Treatment: Consent by patient was obtained for treatment procedures.   Debridement of mycotic and hypertrophic toenails, 1 through 5 bilateral and clearing of subungual debris. No ulceration, no infection noted.  Return Visit-Office Procedure: Patient instructed to return to the office for a follow up visit 10 weeks  for continued evaluation and treatment.    Bracken Moffa DPM 

## 2018-10-27 ENCOUNTER — Other Ambulatory Visit: Payer: Self-pay

## 2018-10-27 ENCOUNTER — Inpatient Hospital Stay: Payer: Medicare Other | Attending: Hematology and Oncology | Admitting: Hematology and Oncology

## 2018-10-27 ENCOUNTER — Inpatient Hospital Stay: Payer: Medicare Other

## 2018-10-27 ENCOUNTER — Encounter: Payer: Self-pay | Admitting: Hematology and Oncology

## 2018-10-27 DIAGNOSIS — D630 Anemia in neoplastic disease: Secondary | ICD-10-CM | POA: Diagnosis not present

## 2018-10-27 DIAGNOSIS — C8588 Other specified types of non-Hodgkin lymphoma, lymph nodes of multiple sites: Secondary | ICD-10-CM | POA: Insufficient documentation

## 2018-10-27 DIAGNOSIS — D6481 Anemia due to antineoplastic chemotherapy: Secondary | ICD-10-CM | POA: Diagnosis not present

## 2018-10-27 DIAGNOSIS — C8580 Other specified types of non-Hodgkin lymphoma, unspecified site: Secondary | ICD-10-CM

## 2018-10-27 DIAGNOSIS — Z95828 Presence of other vascular implants and grafts: Secondary | ICD-10-CM

## 2018-10-27 LAB — COMPREHENSIVE METABOLIC PANEL
ALT: 11 U/L (ref 0–44)
AST: 15 U/L (ref 15–41)
Albumin: 3.7 g/dL (ref 3.5–5.0)
Alkaline Phosphatase: 80 U/L (ref 38–126)
Anion gap: 10 (ref 5–15)
BUN: 29 mg/dL — ABNORMAL HIGH (ref 8–23)
CO2: 27 mmol/L (ref 22–32)
Calcium: 9.2 mg/dL (ref 8.9–10.3)
Chloride: 103 mmol/L (ref 98–111)
Creatinine, Ser: 1.45 mg/dL — ABNORMAL HIGH (ref 0.44–1.00)
GFR calc Af Amer: 39 mL/min — ABNORMAL LOW (ref >60)
GFR calc non Af Amer: 33 mL/min — ABNORMAL LOW (ref >60)
Glucose, Bld: 330 mg/dL — ABNORMAL HIGH (ref 70–99)
Potassium: 4.2 mmol/L (ref 3.5–5.1)
Sodium: 140 mmol/L (ref 135–145)
Total Bilirubin: 0.4 mg/dL (ref 0.3–1.2)
Total Protein: 6.4 g/dL — ABNORMAL LOW (ref 6.5–8.1)

## 2018-10-27 LAB — CBC WITH DIFFERENTIAL/PLATELET
Abs Immature Granulocytes: 0.41 10*3/uL — ABNORMAL HIGH (ref 0.00–0.07)
Basophils Absolute: 0.1 10*3/uL (ref 0.0–0.1)
Basophils Relative: 1 %
Eosinophils Absolute: 0.1 10*3/uL (ref 0.0–0.5)
Eosinophils Relative: 1 %
HCT: 32.8 % — ABNORMAL LOW (ref 36.0–46.0)
Hemoglobin: 10.9 g/dL — ABNORMAL LOW (ref 12.0–15.0)
Immature Granulocytes: 5 %
Lymphocytes Relative: 17 %
Lymphs Abs: 1.5 10*3/uL (ref 0.7–4.0)
MCH: 30 pg (ref 26.0–34.0)
MCHC: 33.2 g/dL (ref 30.0–36.0)
MCV: 90.4 fL (ref 80.0–100.0)
Monocytes Absolute: 0.8 10*3/uL (ref 0.1–1.0)
Monocytes Relative: 9 %
Neutro Abs: 5.8 10*3/uL (ref 1.7–7.7)
Neutrophils Relative %: 67 %
Platelets: 271 10*3/uL (ref 150–400)
RBC: 3.63 MIL/uL — ABNORMAL LOW (ref 3.87–5.11)
RDW: 12.8 % (ref 11.5–15.5)
WBC: 8.6 10*3/uL (ref 4.0–10.5)
nRBC: 0 % (ref 0.0–0.2)

## 2018-10-27 MED ORDER — SODIUM CHLORIDE 0.9% FLUSH
10.0000 mL | Freq: Once | INTRAVENOUS | Status: AC
  Administered 2018-10-27: 12:00:00 10 mL
  Filled 2018-10-27: qty 10

## 2018-10-27 MED ORDER — HEPARIN SOD (PORK) LOCK FLUSH 100 UNIT/ML IV SOLN
250.0000 [IU] | Freq: Once | INTRAVENOUS | Status: AC
  Administered 2018-10-27: 12:00:00 250 [IU]
  Filled 2018-10-27: qty 5

## 2018-10-27 NOTE — Assessment & Plan Note (Signed)
She has multifactorial anemia, likely anemia of chronic illness She is not symptomatic Observe only. 

## 2018-10-27 NOTE — Progress Notes (Signed)
[facility] OFF[facility] PROGRESS NOTE  Patient Care Team: [name], Bill Salinas, MD as PCP - General (Family Medicine) Jamie Peach, MD as Consulting Physician (Ophthalmology) Jamie Castle, MD (Inactive) as Consulting Physician (Gastroenterology) Sypher, Herbie Baltimore, MD (Inactive) as Consulting Physician (Orthopedic Surgery) Jamie Spanner, MD as Referring Physician (Dermatology) Jamie Lark, MD as Consulting Physician (Hematology and Oncology) Jamie Overall, MD as Consulting Physician (General Surgery)  ASSESSMENT & PLAN:  Marginal zone lymphoma She has completed maintenance obinutuzumab except for the last treatment that was omitted due to pandemic concerns Her insurance will not pay for screening/surveillance imaging study Currently, she is not symptomatic I recommend observation only with blood work, history and physical examination and she agreed I will see her back in 8 weeks for further follow-up  Anemia in neoplastic disease She has multifactorial anemia, likely anemia of chronic illness She is not symptomatic Observe only.   No orders of the defined types were placed in this encounter.   INTERVAL HISTORY: Please see below for problem oriented charting. She returns for further follow-up She is doing well No recent lymphadenopathy Denies recent infection, fever or chills She has resumed swimming  SUMMARY OF ONCOLOGIC HISTORY: Oncology History Overview Note  Marginal zone lymphoma, with lymph node and bone marrow involvement along with splenomegaly   Primary site: Lymphoid Neoplasms   Staging method: AJCC 6th Edition   Clinical: Stage IV signed by Jamie Lark, MD on 09/02/2013  7:38 PM   Summary: Stage IV     Marginal zone lymphoma ([place])  07/29/2013 Imaging   CT scan shows splenomegaly and abnormal lymphadenopathy.   08/13/2013 Imaging   PET CT scan confirmed lymphadenopathy and splenomegaly, those areas are PET avid   08/23/2013 Surgery   [id number]  left axillary lymph node biopsy confirmed a low-grade lymphoma.   08/27/2013 Bone Marrow Biopsy   Bone marrow biopsy confirmed lymphoma.[id number]   09/08/2013 - 09/29/2013 Chemotherapy   She completed 4 cycles of rituximab with resolution of splenomegaly.   12/29/2013 Imaging   PET scan show complete response.   07/11/2014 Imaging   PET scan showed recurrent disease   07/19/2014 - 08/09/2014 Chemotherapy   She completed 4 cycles of rituximab with resolution of splenomegaly   10/12/2014 Imaging   Repeat PET/CT scan showed near complete response to treatment.   10/13/2014 - 02/09/2015 Chemotherapy   She is placed on rituximab maintenance therapy.   03/29/2015 Imaging   PET CT showed possible mild progression of splenomegaly   04/26/2015 - 02/22/2016 Chemotherapy   She started taking Ibrutinib   04/28/2015 Adverse Reaction   Treatment was placed temporarily on hold due to severe diarrhea and resumed at reduced dose   07/03/2015 PET scan   No hypermetabolic adenopathy in the neck, chest, abdomen or pelvis.2. Spleen is not hypermetabolic and appears slightly less prominent size.   02/06/2016 PET scan   Hypermetabolic LEFT axillary lymph nodes. Concern for HIGH-GRADE LYMPHOMA LOCALIZED RECURRENCE. 2. Prominent spleen without hypermetabolic activity. No change in size from comparison. 3. No additional hypermetabolic adenopathy on scan.   02/19/2016 Procedure   Technically successful ultrasound-guided core biopsy, left axillary adenopathy.   02/19/2016 Pathology Results   Lymph node, needle/core biopsy, left axilla - ATYPICAL LYMPHOID PROLIFERATION SUSPICIOUS FOR LYMPHOMA. - SEE COMMENT. Microscopic Comment The sections show multiple very small and skinny fragments of lymph nodal tissue displaying areas of lymphoid expansion by a population of small to medium size lymphocytes with round to irregular nuclei, scanty to moderately abundant cytoplasm,  dense chromatin and inconspicuous nucleoli. This is  associated with scattering of "naked" germinal centers with abundant tingible body macrophages. To further evaluate this process, flow cytometric analysis was performed and shows a minor population of monoclonal B cells expressing pan B-cell antigens including CD20 with associated kappa light chain restriction. In addition, immunohistochemical stains were performed including CD20, CD79a, CD3, CD5, CD10, BCL-2, CD21 with appropriate controls. The stains show a mixture of T and B cells with slight predominance of B cells. CD21 highlights numerous areas with dendritic networks, correlating with the presence of germinal centers. CD10 shows focal positivity likely in germinal centers. BCL-2 is diffusely positive except in areas of apparent germinal centers. The Mendez features are atypical and suspicious for involvement by a B-cell lymphoproliferative process, particularly given the B-cell monoclonality by flow cytometry. However, morphologic evaluation is extremely limited due to small biopsy fragments and additional material (excisional biopsy) is strongly recommended for further evaluation. (BNS:ecj 02/21/2016)    03/04/2016 Procedure   Placement of single lumen port a cath via right internal jugular vein. The catheter tip lies at the cavo-atrial junction. A power injectable port a cath was placed and is ready for immediate use.   03/07/2016 - 05/31/2016 Chemotherapy   She received treatment with bendamustine and Gazyva   05/27/2016 PET scan   1. No evidence of active lymphoma on whole-body scan. 2. Resolution of metabolic activity previously identified in the LEFT axilla. 3. Small amount free fluid the pelvis.   03/07/2017 PET scan   1. No findings of active lymphoma in the chest, abdomen, or pelvis. 2. Other imaging findings of potential clinical significance: Aortic Atherosclerosis (ICD10-I70.0). Coronary atherosclerosis. Chronic paranasal sinusitis. Moderately distended gallbladder, chronic     REVIEW  OF SYSTEMS:   Constitutional: Denies fevers, chills or abnormal weight loss Eyes: Denies blurriness of vision Ears, nose, mouth, throat, and face: Denies mucositis or sore throat Respiratory: Denies cough, dyspnea or wheezes Cardiovascular: Denies palpitation, chest discomfort or lower extremity swelling Gastrointestinal:  Denies nausea, heartburn or change in bowel habits Skin: Denies abnormal skin rashes Lymphatics: Denies new lymphadenopathy or easy bruising Neurological:Denies numbness, tingling or new weaknesses Behavioral/Psych: Mood is stable, no new changes  All other systems were reviewed with the patient and are negative.  I have reviewed the past medical history, past surgical history, social history and family history with the patient and they are unchanged from previous note.  ALLERGIES:  is allergic to aspirin; capoten [captopril]; levemir [insulin detemir]; micronase [glyburide]; onglyza [saxagliptin]; statins; and zocor [simvastatin].  MEDICATIONS:  Current Outpatient Medications  Medication Sig Dispense Refill  . acyclovir (ZOVIRAX) 400 MG tablet Take 1 tablet by mouth once daily 30 tablet 9  . amLODipine (NORVASC) 10 MG tablet TAKE 1 TABLET BY MOUTH AT BEDTIME 90 tablet 0  . aspirin 81 MG tablet Take 40.5 mg by mouth 3 (three) times a week.    . Calcium Carbonate-Vit D-Min (CALCIUM 1200 PO) Take 1 tablet by mouth daily. Per patient takes in am and pm    . cholecalciferol (VITAMIN D) 1000 UNITS tablet Take 1,000 Units by mouth daily.     . enalapril (VASOTEC) 20 MG tablet Take 20 mg by mouth every morning.    . fish oil-omega-3 fatty acids 1000 MG capsule Take 1 g by mouth daily.     Marland Kitchen glimepiride (AMARYL) 4 MG tablet Take 4 mg by mouth daily with breakfast. Per patient takes in am and pm.    . glucose blood test  strip Use as instructed 100 each 12  . lidocaine-prilocaine (EMLA) cream Apply 1 application topically as needed. 30 g 3  . loperamide (IMODIUM) 2 MG capsule  Take by mouth as needed for diarrhea or loose stools.    . Magnesium 500 MG CAPS Take 500 mg by mouth daily.     . metFORMIN (GLUCOPHAGE) 1000 MG tablet Take 1,000 mg by mouth 2 (two) times daily with a meal.     . Misc Natural Products (LUTEIN 20 PO) Take by mouth daily.    . Multiple Vitamins-Minerals (CENTRUM CARDIO) TABS Take 1 tablet by mouth daily.    . Multiple Vitamins-Minerals (ICAPS) CAPS Take by mouth daily.     No current facility-administered medications for this visit.     PHYSICAL EXAMINATION: ECOG PERFORMANCE STATUS: 0 - Asymptomatic  Vitals:   10/27/18 1214  BP: (!) 146/67  Pulse: 86  Resp: 18  Temp: 98.7 F (37.1 C)  SpO2: 100%   Filed Weights   10/27/18 1214  Weight: 166 lb 6.4 oz (75.5 kg)    GENERAL:alert, no distress and comfortable SKIN: skin color, texture, turgor are normal, no rashes or significant lesions EYES: normal, Conjunctiva are pink and non-injected, sclera clear OROPHARYNX:no exudate, no erythema and lips, buccal mucosa, and tongue normal  NECK: supple, thyroid normal size, non-tender, without nodularity LYMPH:  no palpable lymphadenopathy in the cervical, axillary or inguinal LUNGS: clear to auscultation and percussion with normal breathing effort HEART: regular rate & rhythm and no murmurs and no lower extremity edema ABDOMEN:abdomen soft, non-tender and normal bowel sounds Musculoskeletal:no cyanosis of digits and no clubbing  NEURO: alert & oriented x 3 with fluent speech, no focal motor/sensory deficits  LABORATORY DATA:  I have reviewed the data as listed    Component Value Date/Time   NA 140 10/27/2018 1138   NA 136 01/07/2017 1004   K 4.2 10/27/2018 1138   K 4.5 01/07/2017 1004   CL 103 10/27/2018 1138   CO2 27 10/27/2018 1138   CO2 23 01/07/2017 1004   GLUCOSE 330 (H) 10/27/2018 1138   GLUCOSE 217 (H) 01/07/2017 1004   BUN 29 (H) 10/27/2018 1138   BUN 23.3 01/07/2017 1004   CREATININE 1.45 (H) 10/27/2018 1138    CREATININE 1.37 (H) 07/06/2018 0959   CREATININE 1.2 (H) 01/07/2017 1004   CALCIUM 9.2 10/27/2018 1138   CALCIUM 9.4 01/07/2017 1004   PROT 6.4 (L) 10/27/2018 1138   PROT 6.7 01/07/2017 1004   ALBUMIN 3.7 10/27/2018 1138   ALBUMIN 3.9 01/07/2017 1004   AST 15 10/27/2018 1138   AST 15 07/06/2018 0959   AST 20 01/07/2017 1004   ALT 11 10/27/2018 1138   ALT 10 07/06/2018 0959   ALT 14 01/07/2017 1004   ALKPHOS 80 10/27/2018 1138   ALKPHOS 70 01/07/2017 1004   BILITOT 0.4 10/27/2018 1138   BILITOT 0.4 07/06/2018 0959   BILITOT 0.49 01/07/2017 1004   GFRNONAA 33 (L) 10/27/2018 1138   GFRNONAA 36 (L) 07/06/2018 0959   GFRNONAA 47 (L) 05/18/2013 1256   GFRAA 39 (L) 10/27/2018 1138   GFRAA 41 (L) 07/06/2018 0959   GFRAA 55 (L) 05/18/2013 1256    No results found for: SPEP, UPEP  Lab Results  Component Value Date   WBC 8.6 10/27/2018   NEUTROABS 5.8 10/27/2018   HGB 10.9 (L) 10/27/2018   HCT 32.8 (L) 10/27/2018   MCV 90.4 10/27/2018   PLT 271 10/27/2018      Chemistry  Component Value Date/Time   NA 140 10/27/2018 1138   NA 136 01/07/2017 1004   K 4.2 10/27/2018 1138   K 4.5 01/07/2017 1004   CL 103 10/27/2018 1138   CO2 27 10/27/2018 1138   CO2 23 01/07/2017 1004   BUN 29 (H) 10/27/2018 1138   BUN 23.3 01/07/2017 1004   CREATININE 1.45 (H) 10/27/2018 1138   CREATININE 1.37 (H) 07/06/2018 0959   CREATININE 1.2 (H) 01/07/2017 1004   GLU 202 (H) 08/09/2014 1000      Component Value Date/Time   CALCIUM 9.2 10/27/2018 1138   CALCIUM 9.4 01/07/2017 1004   ALKPHOS 80 10/27/2018 1138   ALKPHOS 70 01/07/2017 1004   AST 15 10/27/2018 1138   AST 15 07/06/2018 0959   AST 20 01/07/2017 1004   ALT 11 10/27/2018 1138   ALT 10 07/06/2018 0959   ALT 14 01/07/2017 1004   BILITOT 0.4 10/27/2018 1138   BILITOT 0.4 07/06/2018 0959   BILITOT 0.49 01/07/2017 1004      All questions were answered. The patient knows to call the clinic with any problems, questions or  concerns. No barriers to learning was detected.  I spent 10 minutes counseling the patient face to face. The total time spent in the appointment was 15 minutes and more than 50% was on counseling and review of test results  Jamie Lark, MD 10/27/2018 12:25 PM

## 2018-10-27 NOTE — Assessment & Plan Note (Signed)
She has completed maintenance obinutuzumab except for the last treatment that was omitted due to pandemic concerns Her insurance will not pay for screening/surveillance imaging study Currently, she is not symptomatic I recommend observation only with blood work, history and physical examination and she agreed I will see her back in 8 weeks for further follow-up 

## 2018-10-28 ENCOUNTER — Telehealth: Payer: Self-pay | Admitting: Hematology and Oncology

## 2018-10-28 NOTE — Telephone Encounter (Signed)
I talk with patient regarding schedule  

## 2018-11-17 ENCOUNTER — Other Ambulatory Visit: Payer: Self-pay | Admitting: Hematology and Oncology

## 2018-11-17 DIAGNOSIS — I10 Essential (primary) hypertension: Secondary | ICD-10-CM

## 2018-12-22 ENCOUNTER — Inpatient Hospital Stay (HOSPITAL_BASED_OUTPATIENT_CLINIC_OR_DEPARTMENT_OTHER): Payer: Medicare Other | Admitting: Hematology and Oncology

## 2018-12-22 ENCOUNTER — Inpatient Hospital Stay: Payer: Medicare Other | Attending: Hematology and Oncology

## 2018-12-22 ENCOUNTER — Inpatient Hospital Stay: Payer: Medicare Other

## 2018-12-22 ENCOUNTER — Other Ambulatory Visit: Payer: Self-pay

## 2018-12-22 ENCOUNTER — Encounter: Payer: Self-pay | Admitting: Hematology and Oncology

## 2018-12-22 ENCOUNTER — Telehealth: Payer: Self-pay | Admitting: Hematology and Oncology

## 2018-12-22 DIAGNOSIS — Z7982 Long term (current) use of aspirin: Secondary | ICD-10-CM | POA: Insufficient documentation

## 2018-12-22 DIAGNOSIS — D630 Anemia in neoplastic disease: Secondary | ICD-10-CM | POA: Insufficient documentation

## 2018-12-22 DIAGNOSIS — C884 Extranodal marginal zone B-cell lymphoma of mucosa-associated lymphoid tissue [MALT-lymphoma]: Secondary | ICD-10-CM | POA: Diagnosis not present

## 2018-12-22 DIAGNOSIS — R161 Splenomegaly, not elsewhere classified: Secondary | ICD-10-CM | POA: Diagnosis not present

## 2018-12-22 DIAGNOSIS — C8580 Other specified types of non-Hodgkin lymphoma, unspecified site: Secondary | ICD-10-CM

## 2018-12-22 DIAGNOSIS — Z95828 Presence of other vascular implants and grafts: Secondary | ICD-10-CM

## 2018-12-22 DIAGNOSIS — Z79899 Other long term (current) drug therapy: Secondary | ICD-10-CM | POA: Insufficient documentation

## 2018-12-22 LAB — COMPREHENSIVE METABOLIC PANEL
ALT: 14 U/L (ref 0–44)
AST: 18 U/L (ref 15–41)
Albumin: 3.8 g/dL (ref 3.5–5.0)
Alkaline Phosphatase: 89 U/L (ref 38–126)
Anion gap: 11 (ref 5–15)
BUN: 32 mg/dL — ABNORMAL HIGH (ref 8–23)
CO2: 25 mmol/L (ref 22–32)
Calcium: 9.7 mg/dL (ref 8.9–10.3)
Chloride: 102 mmol/L (ref 98–111)
Creatinine, Ser: 1.48 mg/dL — ABNORMAL HIGH (ref 0.44–1.00)
GFR calc Af Amer: 38 mL/min — ABNORMAL LOW (ref >60)
GFR calc non Af Amer: 32 mL/min — ABNORMAL LOW (ref >60)
Glucose, Bld: 314 mg/dL — ABNORMAL HIGH (ref 70–99)
Potassium: 4.4 mmol/L (ref 3.5–5.1)
Sodium: 138 mmol/L (ref 135–145)
Total Bilirubin: 0.6 mg/dL (ref 0.3–1.2)
Total Protein: 6.6 g/dL (ref 6.5–8.1)

## 2018-12-22 LAB — CBC WITH DIFFERENTIAL/PLATELET
Abs Immature Granulocytes: 0.36 10*3/uL — ABNORMAL HIGH (ref 0.00–0.07)
Basophils Absolute: 0.1 10*3/uL (ref 0.0–0.1)
Basophils Relative: 1 %
Eosinophils Absolute: 0.1 10*3/uL (ref 0.0–0.5)
Eosinophils Relative: 1 %
HCT: 33.7 % — ABNORMAL LOW (ref 36.0–46.0)
Hemoglobin: 11.2 g/dL — ABNORMAL LOW (ref 12.0–15.0)
Immature Granulocytes: 5 %
Lymphocytes Relative: 16 %
Lymphs Abs: 1.1 10*3/uL (ref 0.7–4.0)
MCH: 29.8 pg (ref 26.0–34.0)
MCHC: 33.2 g/dL (ref 30.0–36.0)
MCV: 89.6 fL (ref 80.0–100.0)
Monocytes Absolute: 0.6 10*3/uL (ref 0.1–1.0)
Monocytes Relative: 8 %
Neutro Abs: 5.1 10*3/uL (ref 1.7–7.7)
Neutrophils Relative %: 69 %
Platelets: 267 10*3/uL (ref 150–400)
RBC: 3.76 MIL/uL — ABNORMAL LOW (ref 3.87–5.11)
RDW: 13.2 % (ref 11.5–15.5)
WBC: 7.3 10*3/uL (ref 4.0–10.5)
nRBC: 0 % (ref 0.0–0.2)

## 2018-12-22 MED ORDER — SODIUM CHLORIDE 0.9% FLUSH
10.0000 mL | Freq: Once | INTRAVENOUS | Status: AC
  Administered 2018-12-22: 10 mL
  Filled 2018-12-22: qty 10

## 2018-12-22 MED ORDER — HEPARIN SOD (PORK) LOCK FLUSH 100 UNIT/ML IV SOLN
500.0000 [IU] | Freq: Once | INTRAVENOUS | Status: AC
  Administered 2018-12-22: 500 [IU]
  Filled 2018-12-22: qty 5

## 2018-12-22 NOTE — Patient Instructions (Signed)

## 2018-12-22 NOTE — Progress Notes (Signed)
[place] OFFICE PROGRESS NOTE  Patient Care Team: [name], Bill Salinas, MD as PCP - General (Family Medicine) Sharyne Peach, MD as Consulting Physician (Ophthalmology) Inda Castle, MD (Inactive) as Consulting Physician (Gastroenterology) Sypher, Herbie Baltimore, MD (Inactive) as Consulting Physician (Orthopedic Surgery) Jannette Spanner, MD as Referring Physician (Dermatology) Heath Lark, MD as Consulting Physician (Hematology and Oncology) Alphonsa Overall, MD as Consulting Physician (General Surgery)  ASSESSMENT & PLAN:  Marginal zone lymphoma She has completed maintenance obinutuzumab except for the last treatment that was omitted due to pandemic concerns Her insurance will not pay for screening/surveillance imaging study Currently, she is not symptomatic I recommend observation only with blood work, history and physical examination and she agreed I will see her back in 8 weeks for further follow-up  Anemia in neoplastic disease She has multifactorial anemia, likely anemia of chronic illness She is not symptomatic Observe only.   No orders of the defined types were placed in this encounter.   INTERVAL HISTORY: Please see below for problem oriented charting. She returns for further follow-up She feels well No recent infection, fever or chills No new lymphadenopathy  SUMMARY OF ONCOLOGIC HISTORY: Oncology History Overview Note  Marginal zone lymphoma, with lymph node and bone marrow involvement along with splenomegaly   Primary site: Lymphoid Neoplasms   Staging method: AJCC 6th Edition   Clinical: Stage IV signed by Heath Lark, MD on 09/02/2013  7:38 PM   Summary: Stage IV     Marginal zone lymphoma ([place])  07/29/2013 Imaging   CT scan shows splenomegaly and abnormal lymphadenopathy.   08/13/2013 Imaging   PET CT scan confirmed lymphadenopathy and splenomegaly, those areas are PET avid   08/23/2013 Surgery   [id number] left axillary lymph node biopsy  confirmed a low-grade lymphoma.   08/27/2013 Bone Marrow Biopsy   Bone marrow biopsy confirmed lymphoma.[id number]   09/08/2013 - 09/29/2013 Chemotherapy   She completed 4 cycles of rituximab with resolution of splenomegaly.   12/29/2013 Imaging   PET scan show complete response.   07/11/2014 Imaging   PET scan showed recurrent disease   07/19/2014 - 08/09/2014 Chemotherapy   She completed 4 cycles of rituximab with resolution of splenomegaly   10/12/2014 Imaging   Repeat PET/CT scan showed near complete response to treatment.   10/13/2014 - 02/09/2015 Chemotherapy   She is placed on rituximab maintenance therapy.   03/29/2015 Imaging   PET CT showed possible mild progression of splenomegaly   04/26/2015 - 02/22/2016 Chemotherapy   She started taking Ibrutinib   04/28/2015 Adverse Reaction   Treatment was placed temporarily on hold due to severe diarrhea and resumed at reduced dose   07/03/2015 PET scan   No hypermetabolic adenopathy in the neck, chest, abdomen or pelvis.2. Spleen is not hypermetabolic and appears slightly less prominent size.   02/06/2016 PET scan   Hypermetabolic LEFT axillary lymph nodes. Concern for HIGH-GRADE LYMPHOMA LOCALIZED RECURRENCE. 2. Prominent spleen without hypermetabolic activity. No change in size from comparison. 3. No additional hypermetabolic adenopathy on scan.   02/19/2016 Procedure   Technically successful ultrasound-guided core biopsy, left axillary adenopathy.   02/19/2016 Pathology Results   Lymph node, needle/core biopsy, left axilla - ATYPICAL LYMPHOID PROLIFERATION SUSPICIOUS FOR LYMPHOMA. - SEE COMMENT. Microscopic Comment The sections show multiple very small and skinny fragments of lymph nodal tissue displaying areas of lymphoid expansion by a population of small to medium size lymphocytes with round to irregular nuclei, scanty to moderately abundant cytoplasm, dense chromatin and inconspicuous nucleoli.  This is associated with scattering of  "naked" germinal centers with abundant tingible body macrophages. To further evaluate this process, flow cytometric analysis was performed and shows a minor population of monoclonal B cells expressing pan B-cell antigens including CD20 with associated kappa light chain restriction. In addition, immunohistochemical stains were performed including CD20, CD79a, CD3, CD5, CD10, BCL-2, CD21 with appropriate controls. The stains show a mixture of T and B cells with slight predominance of B cells. CD21 highlights numerous areas with dendritic networks, correlating with the presence of germinal centers. CD10 shows focal positivity likely in germinal centers. BCL-2 is diffusely positive except in areas of apparent germinal centers. The overall features are atypical and suspicious for involvement by a B-cell lymphoproliferative process, particularly given the B-cell monoclonality by flow cytometry. However, morphologic evaluation is extremely limited due to small biopsy fragments and additional material (excisional biopsy) is strongly recommended for further evaluation. (BNS:ecj 02/21/2016)    03/04/2016 Procedure   Placement of single lumen port a cath via right internal jugular vein. The catheter tip lies at the cavo-atrial junction. A power injectable port a cath was placed and is ready for immediate use.   03/07/2016 - 05/31/2016 Chemotherapy   She received treatment with bendamustine and Gazyva   05/27/2016 PET scan   1. No evidence of active lymphoma on whole-body scan. 2. Resolution of metabolic activity previously identified in the LEFT axilla. 3. Small amount free fluid the pelvis.   03/07/2017 PET scan   1. No findings of active lymphoma in the chest, abdomen, or pelvis. 2. Other imaging findings of potential clinical significance: Aortic Atherosclerosis (ICD10-I70.0). Coronary atherosclerosis. Chronic paranasal sinusitis. Moderately distended gallbladder, chronic     REVIEW OF SYSTEMS:   Constitutional:  Denies fevers, chills or abnormal weight loss Eyes: Denies blurriness of vision Ears, nose, mouth, throat, and face: Denies mucositis or sore throat Respiratory: Denies cough, dyspnea or wheezes Cardiovascular: Denies palpitation, chest discomfort or lower extremity swelling Gastrointestinal:  Denies nausea, heartburn or change in bowel habits Skin: Denies abnormal skin rashes Lymphatics: Denies new lymphadenopathy or easy bruising Neurological:Denies numbness, tingling or new weaknesses Behavioral/Psych: Mood is stable, no new changes  All other systems were reviewed with the patient and are negative.  I have reviewed the past medical history, past surgical history, social history and family history with the patient and they are unchanged from previous note.  ALLERGIES:  is allergic to aspirin; capoten [captopril]; levemir [insulin detemir]; micronase [glyburide]; onglyza [saxagliptin]; statins; and zocor [simvastatin].  MEDICATIONS:  Current Outpatient Medications  Medication Sig Dispense Refill  . acyclovir (ZOVIRAX) 400 MG tablet Take 1 tablet by mouth once daily 30 tablet 9  . amLODipine (NORVASC) 10 MG tablet TAKE 1 TABLET BY MOUTH AT BEDTIME 90 tablet 0  . aspirin 81 MG tablet Take 40.5 mg by mouth 3 (three) times a week.    . Calcium Carbonate-Vit D-Min (CALCIUM 1200 PO) Take 1 tablet by mouth daily. Per patient takes in am and pm    . cholecalciferol (VITAMIN D) 1000 UNITS tablet Take 1,000 Units by mouth daily.     . enalapril (VASOTEC) 20 MG tablet Take 20 mg by mouth every morning.    . fish oil-omega-3 fatty acids 1000 MG capsule Take 1 g by mouth daily.     Marland Kitchen glimepiride (AMARYL) 4 MG tablet Take 4 mg by mouth daily with breakfast. Per patient takes in am and pm.    . glucose blood test strip Use as instructed 100  each 12  . lidocaine-prilocaine (EMLA) cream Apply 1 application topically as needed. 30 g 3  . loperamide (IMODIUM) 2 MG capsule Take by mouth as needed for  diarrhea or loose stools.    . Magnesium 500 MG CAPS Take 500 mg by mouth daily.     . metFORMIN (GLUCOPHAGE) 1000 MG tablet Take 1,000 mg by mouth 2 (two) times daily with a meal.     . Misc Natural Products (LUTEIN 20 PO) Take by mouth daily.    . Multiple Vitamins-Minerals (CENTRUM CARDIO) TABS Take 1 tablet by mouth daily.    . Multiple Vitamins-Minerals (ICAPS) CAPS Take by mouth daily.     No current facility-administered medications for this visit.     PHYSICAL EXAMINATION: ECOG PERFORMANCE STATUS: 0 - Asymptomatic  Vitals:   12/22/18 1115  BP: 139/70  Pulse: 81  Resp: 16  Temp: 98.5 F (36.9 C)  SpO2: 100%   Filed Weights   12/22/18 1115  Weight: 162 lb 9.6 oz (73.8 kg)    GENERAL:alert, no distress and comfortable SKIN: skin color, texture, turgor are normal, no rashes or significant lesions EYES: normal, Conjunctiva are pink and non-injected, sclera clear OROPHARYNX:no exudate, no erythema and lips, buccal mucosa, and tongue normal  NECK: supple, thyroid normal size, non-tender, without nodularity LYMPH:  no palpable lymphadenopathy in the cervical, axillary or inguinal LUNGS: clear to auscultation and percussion with normal breathing effort HEART: regular rate & rhythm and no murmurs and no lower extremity edema ABDOMEN:abdomen soft, non-tender and normal bowel sounds Musculoskeletal:no cyanosis of digits and no clubbing  NEURO: alert & oriented x 3 with fluent speech, no focal motor/sensory deficits  LABORATORY DATA:  I have reviewed the data as listed    Component Value Date/Time   NA 138 12/22/2018 1104   NA 136 01/07/2017 1004   K 4.4 12/22/2018 1104   K 4.5 01/07/2017 1004   CL 102 12/22/2018 1104   CO2 25 12/22/2018 1104   CO2 23 01/07/2017 1004   GLUCOSE 314 (H) 12/22/2018 1104   GLUCOSE 217 (H) 01/07/2017 1004   BUN 32 (H) 12/22/2018 1104   BUN 23.3 01/07/2017 1004   CREATININE 1.48 (H) 12/22/2018 1104   CREATININE 1.37 (H) 07/06/2018 0959    CREATININE 1.2 (H) 01/07/2017 1004   CALCIUM 9.7 12/22/2018 1104   CALCIUM 9.4 01/07/2017 1004   PROT 6.6 12/22/2018 1104   PROT 6.7 01/07/2017 1004   ALBUMIN 3.8 12/22/2018 1104   ALBUMIN 3.9 01/07/2017 1004   AST 18 12/22/2018 1104   AST 15 07/06/2018 0959   AST 20 01/07/2017 1004   ALT 14 12/22/2018 1104   ALT 10 07/06/2018 0959   ALT 14 01/07/2017 1004   ALKPHOS 89 12/22/2018 1104   ALKPHOS 70 01/07/2017 1004   BILITOT 0.6 12/22/2018 1104   BILITOT 0.4 07/06/2018 0959   BILITOT 0.49 01/07/2017 1004   GFRNONAA 32 (L) 12/22/2018 1104   GFRNONAA 36 (L) 07/06/2018 0959   GFRNONAA 47 (L) 05/18/2013 1256   GFRAA 38 (L) 12/22/2018 1104   GFRAA 41 (L) 07/06/2018 0959   GFRAA 55 (L) 05/18/2013 1256    No results found for: SPEP, UPEP  Lab Results  Component Value Date   WBC 7.3 12/22/2018   NEUTROABS 5.1 12/22/2018   HGB 11.2 (L) 12/22/2018   HCT 33.7 (L) 12/22/2018   MCV 89.6 12/22/2018   PLT 267 12/22/2018      Chemistry      Component Value Date/Time  NA 138 12/22/2018 1104   NA 136 01/07/2017 1004   K 4.4 12/22/2018 1104   K 4.5 01/07/2017 1004   CL 102 12/22/2018 1104   CO2 25 12/22/2018 1104   CO2 23 01/07/2017 1004   BUN 32 (H) 12/22/2018 1104   BUN 23.3 01/07/2017 1004   CREATININE 1.48 (H) 12/22/2018 1104   CREATININE 1.37 (H) 07/06/2018 0959   CREATININE 1.2 (H) 01/07/2017 1004   GLU 202 (H) 08/09/2014 1000      Component Value Date/Time   CALCIUM 9.7 12/22/2018 1104   CALCIUM 9.4 01/07/2017 1004   ALKPHOS 89 12/22/2018 1104   ALKPHOS 70 01/07/2017 1004   AST 18 12/22/2018 1104   AST 15 07/06/2018 0959   AST 20 01/07/2017 1004   ALT 14 12/22/2018 1104   ALT 10 07/06/2018 0959   ALT 14 01/07/2017 1004   BILITOT 0.6 12/22/2018 1104   BILITOT 0.4 07/06/2018 0959   BILITOT 0.49 01/07/2017 1004       All questions were answered. The patient knows to call the clinic with any problems, questions or concerns. No barriers to learning was  detected.  I spent 10 minutes counseling the patient face to face. The total time spent in the appointment was 15 minutes and more than 50% was on counseling and review of test results  Heath Lark, MD 12/22/2018 1:10 PM

## 2018-12-22 NOTE — Telephone Encounter (Signed)
Scheduled appt per 11/24 sch message - pt is aware of appt date and time   

## 2018-12-22 NOTE — Assessment & Plan Note (Signed)
She has completed maintenance obinutuzumab except for the last treatment that was omitted due to pandemic concerns Her insurance will not pay for screening/surveillance imaging study Currently, she is not symptomatic I recommend observation only with blood work, history and physical examination and she agreed I will see her back in 8 weeks for further follow-up

## 2018-12-22 NOTE — Assessment & Plan Note (Signed)
She has multifactorial anemia, likely anemia of chronic illness She is not symptomatic Observe only. 

## 2019-01-05 ENCOUNTER — Ambulatory Visit: Payer: Medicare Other | Admitting: Podiatry

## 2019-01-05 ENCOUNTER — Encounter: Payer: Self-pay | Admitting: Podiatry

## 2019-01-05 ENCOUNTER — Other Ambulatory Visit: Payer: Self-pay

## 2019-01-05 DIAGNOSIS — B351 Tinea unguium: Secondary | ICD-10-CM | POA: Diagnosis not present

## 2019-01-05 DIAGNOSIS — M79675 Pain in left toe(s): Secondary | ICD-10-CM

## 2019-01-05 DIAGNOSIS — M79674 Pain in right toe(s): Secondary | ICD-10-CM | POA: Diagnosis not present

## 2019-01-05 DIAGNOSIS — E1121 Type 2 diabetes mellitus with diabetic nephropathy: Secondary | ICD-10-CM

## 2019-01-05 NOTE — Progress Notes (Signed)
Complaint:  Visit Type: Patient returns to my office for continued preventative foot care services. Complaint: Patient states" my nails have grown long and thick and become painful to walk and wear shoes" Patient has been diagnosed with DM with no foot complications. The patient presents for preventative foot care services. No changes to ROS  Podiatric Exam: Vascular: dorsalis pedis and posterior tibial pulses are palpable bilateral. Capillary return is immediate. Temperature gradient is WNL. Skin turgor WNL  Sensorium: Normal Semmes Weinstein monofilament test. Normal tactile sensation bilaterally. Nail Exam: Pt has thick disfigured discolored nails with subungual debris noted bilateral entire nail hallux through fifth toenails Ulcer Exam: There is no evidence of ulcer or pre-ulcerative changes or infection. Orthopedic Exam: Muscle tone and strength are WNL. No limitations in general ROM. No crepitus or effusions noted. Foot type and digits show no abnormalities. HAV  B/L Skin: No Porokeratosis. No infection or ulcers  Diagnosis:  Onychomycosis, , Pain in right toe, pain in left toes  Treatment & Plan Procedures and Treatment: Consent by patient was obtained for treatment procedures.   Debridement of mycotic and hypertrophic toenails, 1 through 5 bilateral and clearing of subungual debris. No ulceration, no infection noted.  Return Visit-Office Procedure: Patient instructed to return to the office for a follow up visit 10 weeks  for continued evaluation and treatment.    Gardiner Barefoot DPM

## 2019-02-16 ENCOUNTER — Inpatient Hospital Stay: Payer: Medicare Other

## 2019-02-16 ENCOUNTER — Other Ambulatory Visit: Payer: Self-pay

## 2019-02-16 ENCOUNTER — Inpatient Hospital Stay: Payer: Medicare Other | Attending: Hematology and Oncology | Admitting: Hematology and Oncology

## 2019-02-16 DIAGNOSIS — I129 Hypertensive chronic kidney disease with stage 1 through stage 4 chronic kidney disease, or unspecified chronic kidney disease: Secondary | ICD-10-CM | POA: Diagnosis not present

## 2019-02-16 DIAGNOSIS — C8580 Other specified types of non-Hodgkin lymphoma, unspecified site: Secondary | ICD-10-CM

## 2019-02-16 DIAGNOSIS — E1122 Type 2 diabetes mellitus with diabetic chronic kidney disease: Secondary | ICD-10-CM | POA: Diagnosis not present

## 2019-02-16 DIAGNOSIS — Z8572 Personal history of non-Hodgkin lymphomas: Secondary | ICD-10-CM | POA: Diagnosis not present

## 2019-02-16 DIAGNOSIS — Z95828 Presence of other vascular implants and grafts: Secondary | ICD-10-CM

## 2019-02-16 DIAGNOSIS — I10 Essential (primary) hypertension: Secondary | ICD-10-CM

## 2019-02-16 DIAGNOSIS — N183 Chronic kidney disease, stage 3 unspecified: Secondary | ICD-10-CM | POA: Diagnosis not present

## 2019-02-16 DIAGNOSIS — E1121 Type 2 diabetes mellitus with diabetic nephropathy: Secondary | ICD-10-CM

## 2019-02-16 LAB — COMPREHENSIVE METABOLIC PANEL
ALT: 12 U/L (ref 0–44)
AST: 15 U/L (ref 15–41)
Albumin: 3.8 g/dL (ref 3.5–5.0)
Alkaline Phosphatase: 92 U/L (ref 38–126)
Anion gap: 9 (ref 5–15)
BUN: 25 mg/dL — ABNORMAL HIGH (ref 8–23)
CO2: 26 mmol/L (ref 22–32)
Calcium: 9 mg/dL (ref 8.9–10.3)
Chloride: 102 mmol/L (ref 98–111)
Creatinine, Ser: 1.41 mg/dL — ABNORMAL HIGH (ref 0.44–1.00)
GFR calc Af Amer: 40 mL/min — ABNORMAL LOW (ref >60)
GFR calc non Af Amer: 34 mL/min — ABNORMAL LOW (ref >60)
Glucose, Bld: 261 mg/dL — ABNORMAL HIGH (ref 70–99)
Potassium: 4.3 mmol/L (ref 3.5–5.1)
Sodium: 137 mmol/L (ref 135–145)
Total Bilirubin: 0.5 mg/dL (ref 0.3–1.2)
Total Protein: 6.6 g/dL (ref 6.5–8.1)

## 2019-02-16 LAB — CBC WITH DIFFERENTIAL/PLATELET
Abs Immature Granulocytes: 0.5 10*3/uL — ABNORMAL HIGH (ref 0.00–0.07)
Basophils Absolute: 0.1 10*3/uL (ref 0.0–0.1)
Basophils Relative: 1 %
Eosinophils Absolute: 0.1 10*3/uL (ref 0.0–0.5)
Eosinophils Relative: 2 %
HCT: 35.2 % — ABNORMAL LOW (ref 36.0–46.0)
Hemoglobin: 11.6 g/dL — ABNORMAL LOW (ref 12.0–15.0)
Immature Granulocytes: 6 %
Lymphocytes Relative: 16 %
Lymphs Abs: 1.3 10*3/uL (ref 0.7–4.0)
MCH: 29.6 pg (ref 26.0–34.0)
MCHC: 33 g/dL (ref 30.0–36.0)
MCV: 89.8 fL (ref 80.0–100.0)
Monocytes Absolute: 0.7 10*3/uL (ref 0.1–1.0)
Monocytes Relative: 9 %
Neutro Abs: 5.5 10*3/uL (ref 1.7–7.7)
Neutrophils Relative %: 66 %
Platelets: 261 10*3/uL (ref 150–400)
RBC: 3.92 MIL/uL (ref 3.87–5.11)
RDW: 12.8 % (ref 11.5–15.5)
WBC: 8.2 10*3/uL (ref 4.0–10.5)
nRBC: 0 % (ref 0.0–0.2)

## 2019-02-16 MED ORDER — HEPARIN SOD (PORK) LOCK FLUSH 100 UNIT/ML IV SOLN
500.0000 [IU] | Freq: Once | INTRAVENOUS | Status: AC
  Administered 2019-02-16: 500 [IU]
  Filled 2019-02-16: qty 5

## 2019-02-16 MED ORDER — SODIUM CHLORIDE 0.9% FLUSH
10.0000 mL | Freq: Once | INTRAVENOUS | Status: AC
  Administered 2019-02-16: 10 mL
  Filled 2019-02-16: qty 10

## 2019-02-17 ENCOUNTER — Other Ambulatory Visit: Payer: Self-pay | Admitting: Hematology and Oncology

## 2019-02-17 ENCOUNTER — Encounter: Payer: Self-pay | Admitting: Hematology and Oncology

## 2019-02-17 DIAGNOSIS — I10 Essential (primary) hypertension: Secondary | ICD-10-CM

## 2019-02-17 NOTE — Assessment & Plan Note (Signed)
Her kidney function tests is stable. Continue close monitoring and risk factor modification 

## 2019-02-17 NOTE — Assessment & Plan Note (Signed)
she will continue current medical management. I recommend close follow-up with primary care doctor for medication adjustment.  

## 2019-02-17 NOTE — Assessment & Plan Note (Signed)
She has poorly controlled diabetes I recommend close follow-up with primary care doctor for medical management

## 2019-02-17 NOTE — Progress Notes (Signed)
[facility] OFFICE PROGRESS NOTE  Patient Care Team: [name], Bill Salinas, MD as PCP - General (Family Medicine) Sharyne Peach, MD as Consulting Physician (Ophthalmology) Inda Castle, MD (Inactive) as Consulting Physician (Gastroenterology) Sypher, Herbie Baltimore, MD (Inactive) as Consulting Physician (Orthopedic Surgery) Jannette Spanner, MD as Referring Physician (Dermatology) Heath Lark, MD as Consulting Physician (Hematology and Oncology) Alphonsa Overall, MD as Consulting Physician (General Surgery)  ASSESSMENT & PLAN:  Marginal zone lymphoma She has completed maintenance obinutuzumab except for the last treatment that was omitted due to pandemic concerns Her insurance will not pay for screening/surveillance imaging study Currently, she is not symptomatic and clinically, has no signs or symptoms to suggest cancer recurrence I recommend observation only with blood work, history and physical examination and she agreed I will see her back in 8 weeks for further follow-up  We discussed the risk and benefits of Covid vaccination There is no contraindication for her to proceed  Chronic kidney disease (CKD), stage III (moderate) Her kidney function tests is stable. Continue close monitoring and risk factor modification  Type 2 diabetes mellitus with diabetic nephropathy She has poorly controlled diabetes I recommend close follow-up with primary care doctor for medical management  Essential hypertension she will continue current medical management. I recommend close follow-up with primary care doctor for medication adjustment.    No orders of the defined types were placed in this encounter.   All questions were answered. The patient knows to call the clinic with any problems, questions or concerns. The total time spent in the appointment was 20 minutes encounter with patients including review of chart and various tests results, discussions about plan of care and  coordination of care plan   Heath Lark, MD 02/17/2019 9:43 AM  INTERVAL HISTORY: Please see below for problem oriented charting. She returns for further follow-up on history of recurrent lymphoma She is doing well No recent infection, fever or chills No new lymphadenopathy No recent abdominal pain or discomfort Her weight has been stable  SUMMARY OF ONCOLOGIC HISTORY: Oncology History Overview Note  Marginal zone lymphoma, with lymph node and bone marrow involvement along with splenomegaly   Primary site: Lymphoid Neoplasms   Staging method: AJCC 6th Edition   Clinical: Stage IV signed by Heath Lark, MD on 09/02/2013  7:38 PM   Summary: Stage IV     Marginal zone lymphoma ([place])  07/29/2013 Imaging   CT scan shows splenomegaly and abnormal lymphadenopathy.   08/13/2013 Imaging   PET CT scan confirmed lymphadenopathy and splenomegaly, those areas are PET avid   08/23/2013 Surgery   [id number] left axillary lymph node biopsy confirmed a low-grade lymphoma.   08/27/2013 Bone Marrow Biopsy   Bone marrow biopsy confirmed lymphoma.[id number]   09/08/2013 - 09/29/2013 Chemotherapy   She completed 4 cycles of rituximab with resolution of splenomegaly.   12/29/2013 Imaging   PET scan show complete response.   07/11/2014 Imaging   PET scan showed recurrent disease   07/19/2014 - 08/09/2014 Chemotherapy   She completed 4 cycles of rituximab with resolution of splenomegaly   10/12/2014 Imaging   Repeat PET/CT scan showed near complete response to treatment.   10/13/2014 - 02/09/2015 Chemotherapy   She is placed on rituximab maintenance therapy.   03/29/2015 Imaging   PET CT showed possible mild progression of splenomegaly   04/26/2015 - 02/22/2016 Chemotherapy   She started taking Ibrutinib   04/28/2015 Adverse Reaction   Treatment was placed temporarily on hold due to severe diarrhea and  resumed at reduced dose   07/03/2015 PET scan   No hypermetabolic adenopathy in the neck, chest,  abdomen or pelvis.2. Spleen is not hypermetabolic and appears slightly less prominent size.   02/06/2016 PET scan   Hypermetabolic LEFT axillary lymph nodes. Concern for HIGH-GRADE LYMPHOMA LOCALIZED RECURRENCE. 2. Prominent spleen without hypermetabolic activity. No change in size from comparison. 3. No additional hypermetabolic adenopathy on scan.   02/19/2016 Procedure   Technically successful ultrasound-guided core biopsy, left axillary adenopathy.   02/19/2016 Pathology Results   Lymph node, needle/core biopsy, left axilla - ATYPICAL LYMPHOID PROLIFERATION SUSPICIOUS FOR LYMPHOMA. - SEE COMMENT. Microscopic Comment The sections show multiple very small and skinny fragments of lymph nodal tissue displaying areas of lymphoid expansion by a population of small to medium size lymphocytes with round to irregular nuclei, scanty to moderately abundant cytoplasm, dense chromatin and inconspicuous nucleoli. This is associated with scattering of "naked" germinal centers with abundant tingible body macrophages. To further evaluate this process, flow cytometric analysis was performed and shows a minor population of monoclonal B cells expressing pan B-cell antigens including CD20 with associated kappa light chain restriction. In addition, immunohistochemical stains were performed including CD20, CD79a, CD3, CD5, CD10, BCL-2, CD21 with appropriate controls. The stains show a mixture of T and B cells with slight predominance of B cells. CD21 highlights numerous areas with dendritic networks, correlating with the presence of germinal centers. CD10 shows focal positivity likely in germinal centers. BCL-2 is diffusely positive except in areas of apparent germinal centers. The overall features are atypical and suspicious for involvement by a B-cell lymphoproliferative process, particularly given the B-cell monoclonality by flow cytometry. However, morphologic evaluation is extremely limited due to small biopsy  fragments and additional material (excisional biopsy) is strongly recommended for further evaluation. (BNS:ecj 02/21/2016)    03/04/2016 Procedure   Placement of single lumen port a cath via right internal jugular vein. The catheter tip lies at the cavo-atrial junction. A power injectable port a cath was placed and is ready for immediate use.   03/07/2016 - 05/31/2016 Chemotherapy   She received treatment with bendamustine and Gazyva   05/27/2016 PET scan   1. No evidence of active lymphoma on whole-body scan. 2. Resolution of metabolic activity previously identified in the LEFT axilla. 3. Small amount free fluid the pelvis.   03/07/2017 PET scan   1. No findings of active lymphoma in the chest, abdomen, or pelvis. 2. Other imaging findings of potential clinical significance: Aortic Atherosclerosis (ICD10-I70.0). Coronary atherosclerosis. Chronic paranasal sinusitis. Moderately distended gallbladder, chronic     REVIEW OF SYSTEMS:   Constitutional: Denies fevers, chills or abnormal weight loss Eyes: Denies blurriness of vision Ears, nose, mouth, throat, and face: Denies mucositis or sore throat Respiratory: Denies cough, dyspnea or wheezes Cardiovascular: Denies palpitation, chest discomfort or lower extremity swelling Gastrointestinal:  Denies nausea, heartburn or change in bowel habits Skin: Denies abnormal skin rashes Lymphatics: Denies new lymphadenopathy or easy bruising Neurological:Denies numbness, tingling or new weaknesses Behavioral/Psych: Mood is stable, no new changes  All other systems were reviewed with the patient and are negative.  I have reviewed the past medical history, past surgical history, social history and family history with the patient and they are unchanged from previous note.  ALLERGIES:  is allergic to aspirin; capoten [captopril]; levemir [insulin detemir]; micronase [glyburide]; onglyza [saxagliptin]; statins; and zocor [simvastatin].  MEDICATIONS:  Current  Outpatient Medications  Medication Sig Dispense Refill  . Accu-Chek FastClix Lancets MISC USE 1 TO CHECK  GLUCOSE ONCE DAILY    . acyclovir (ZOVIRAX) 400 MG tablet Take 1 tablet by mouth once daily 30 tablet 9  . amLODipine (NORVASC) 10 MG tablet TAKE 1 TABLET BY MOUTH AT BEDTIME 90 tablet 0  . aspirin 81 MG tablet Take 40.5 mg by mouth 3 (three) times a week.    . Calcium Carbonate-Vit D-Min (CALCIUM 1200 PO) Take 1 tablet by mouth daily. Per patient takes in am and pm    . cholecalciferol (VITAMIN D) 1000 UNITS tablet Take 1,000 Units by mouth daily.     . enalapril (VASOTEC) 20 MG tablet Take 20 mg by mouth every morning.    . fish oil-omega-3 fatty acids 1000 MG capsule Take 1 g by mouth daily.     Marland Kitchen glimepiride (AMARYL) 4 MG tablet Take 4 mg by mouth daily with breakfast. Per patient takes in am and pm.    . glucose blood test strip Use as instructed 100 each 12  . lidocaine-prilocaine (EMLA) cream Apply 1 application topically as needed. 30 g 3  . loperamide (IMODIUM) 2 MG capsule Take by mouth as needed for diarrhea or loose stools.    . Magnesium 500 MG CAPS Take 500 mg by mouth daily.     . metFORMIN (GLUCOPHAGE) 1000 MG tablet Take 1,000 mg by mouth 2 (two) times daily with a meal.     . Misc Natural Products (LUTEIN 20 PO) Take by mouth daily.    . Multiple Vitamins-Minerals (CENTRUM CARDIO) TABS Take 1 tablet by mouth daily.    . Multiple Vitamins-Minerals (ICAPS) CAPS Take by mouth daily.     No current facility-administered medications for this visit.    PHYSICAL EXAMINATION: ECOG PERFORMANCE STATUS: 0 - Asymptomatic  Vitals:   02/16/19 1117  BP: (!) 154/56  Pulse: 79  Resp: 18  Temp: 98 F (36.7 C)  SpO2: 99%   Filed Weights   02/16/19 1117  Weight: 163 lb 14.4 oz (74.3 kg)    GENERAL:alert, no distress and comfortable SKIN: skin color, texture, turgor are normal, no rashes or significant lesions EYES: normal, Conjunctiva are pink and non-injected, sclera  clear OROPHARYNX:no exudate, no erythema and lips, buccal mucosa, and tongue normal  NECK: supple, thyroid normal size, non-tender, without nodularity LYMPH:  no palpable lymphadenopathy in the cervical, axillary or inguinal LUNGS: clear to auscultation and percussion with normal breathing effort HEART: regular rate & rhythm and no murmurs and no lower extremity edema ABDOMEN:abdomen soft, non-tender and normal bowel sounds Musculoskeletal:no cyanosis of digits and no clubbing  NEURO: alert & oriented x 3 with fluent speech, no focal motor/sensory deficits  LABORATORY DATA:  I have reviewed the data as listed    Component Value Date/Time   NA 137 02/16/2019 1028   NA 136 01/07/2017 1004   K 4.3 02/16/2019 1028   K 4.5 01/07/2017 1004   CL 102 02/16/2019 1028   CO2 26 02/16/2019 1028   CO2 23 01/07/2017 1004   GLUCOSE 261 (H) 02/16/2019 1028   GLUCOSE 217 (H) 01/07/2017 1004   BUN 25 (H) 02/16/2019 1028   BUN 23.3 01/07/2017 1004   CREATININE 1.41 (H) 02/16/2019 1028   CREATININE 1.37 (H) 07/06/2018 0959   CREATININE 1.2 (H) 01/07/2017 1004   CALCIUM 9.0 02/16/2019 1028   CALCIUM 9.4 01/07/2017 1004   PROT 6.6 02/16/2019 1028   PROT 6.7 01/07/2017 1004   ALBUMIN 3.8 02/16/2019 1028   ALBUMIN 3.9 01/07/2017 1004   AST 15 02/16/2019 1028  AST 15 07/06/2018 0959   AST 20 01/07/2017 1004   ALT 12 02/16/2019 1028   ALT 10 07/06/2018 0959   ALT 14 01/07/2017 1004   ALKPHOS 92 02/16/2019 1028   ALKPHOS 70 01/07/2017 1004   BILITOT 0.5 02/16/2019 1028   BILITOT 0.4 07/06/2018 0959   BILITOT 0.49 01/07/2017 1004   GFRNONAA 34 (L) 02/16/2019 1028   GFRNONAA 36 (L) 07/06/2018 0959   GFRNONAA 47 (L) 05/18/2013 1256   GFRAA 40 (L) 02/16/2019 1028   GFRAA 41 (L) 07/06/2018 0959   GFRAA 55 (L) 05/18/2013 1256    No results found for: SPEP, UPEP  Lab Results  Component Value Date   WBC 8.2 02/16/2019   NEUTROABS 5.5 02/16/2019   HGB 11.6 (L) 02/16/2019   HCT 35.2 (L)  02/16/2019   MCV 89.8 02/16/2019   PLT 261 02/16/2019      Chemistry      Component Value Date/Time   NA 137 02/16/2019 1028   NA 136 01/07/2017 1004   K 4.3 02/16/2019 1028   K 4.5 01/07/2017 1004   CL 102 02/16/2019 1028   CO2 26 02/16/2019 1028   CO2 23 01/07/2017 1004   BUN 25 (H) 02/16/2019 1028   BUN 23.3 01/07/2017 1004   CREATININE 1.41 (H) 02/16/2019 1028   CREATININE 1.37 (H) 07/06/2018 0959   CREATININE 1.2 (H) 01/07/2017 1004   GLU 202 (H) 08/09/2014 1000      Component Value Date/Time   CALCIUM 9.0 02/16/2019 1028   CALCIUM 9.4 01/07/2017 1004   ALKPHOS 92 02/16/2019 1028   ALKPHOS 70 01/07/2017 1004   AST 15 02/16/2019 1028   AST 15 07/06/2018 0959   AST 20 01/07/2017 1004   ALT 12 02/16/2019 1028   ALT 10 07/06/2018 0959   ALT 14 01/07/2017 1004   BILITOT 0.5 02/16/2019 1028   BILITOT 0.4 07/06/2018 0959   BILITOT 0.49 01/07/2017 1004

## 2019-02-17 NOTE — Assessment & Plan Note (Addendum)
She has completed maintenance obinutuzumab except for the last treatment that was omitted due to pandemic concerns Her insurance will not pay for screening/surveillance imaging study Currently, she is not symptomatic and clinically, has no signs or symptoms to suggest cancer recurrence I recommend observation only with blood work, history and physical examination and she agreed I will see her back in 8 weeks for further follow-up  We discussed the risk and benefits of Covid vaccination There is no contraindication for her to proceed

## 2019-02-18 ENCOUNTER — Telehealth: Payer: Self-pay | Admitting: Hematology and Oncology

## 2019-02-18 NOTE — Telephone Encounter (Signed)
Scheduled per 1/19 sch msg. Called and spoke with pt, confirmed 3/16 appt

## 2019-03-13 ENCOUNTER — Ambulatory Visit: Payer: Medicare Other | Attending: Internal Medicine

## 2019-03-13 DIAGNOSIS — Z23 Encounter for immunization: Secondary | ICD-10-CM | POA: Insufficient documentation

## 2019-03-13 NOTE — Progress Notes (Signed)
   Covid-19 Vaccination Clinic  Name:  Jamie Mendez    MRN: [id number] DOB: 12-16-1933  03/13/2019  [name] was observed post Covid-19 immunization for 15 minutes without incidence. She was provided with Vaccine Information Sheet and instruction to access the V-Safe system.   [name] was instructed to call 911 with any severe reactions post vaccine: Marland Kitchen Difficulty breathing  . Swelling of your face and throat  . A fast heartbeat  . A bad rash all over your body  . Dizziness and weakness    Immunizations Administered    Name Date Dose VIS Date Route   Pfizer COVID-19 Vaccine 03/13/2019  3:14 PM 0.3 mL 01/08/2019 Intramuscular   Manufacturer: [place]   Lot: [id number]   [place]: [id number]

## 2019-03-16 ENCOUNTER — Ambulatory Visit: Payer: Medicare Other | Admitting: Podiatry

## 2019-03-16 ENCOUNTER — Other Ambulatory Visit: Payer: Self-pay

## 2019-03-16 ENCOUNTER — Encounter: Payer: Self-pay | Admitting: Podiatry

## 2019-03-16 VITALS — Temp 97.1°F

## 2019-03-16 DIAGNOSIS — B351 Tinea unguium: Secondary | ICD-10-CM

## 2019-03-16 DIAGNOSIS — M79674 Pain in right toe(s): Secondary | ICD-10-CM | POA: Diagnosis not present

## 2019-03-16 DIAGNOSIS — E1121 Type 2 diabetes mellitus with diabetic nephropathy: Secondary | ICD-10-CM

## 2019-03-16 DIAGNOSIS — M79675 Pain in left toe(s): Secondary | ICD-10-CM

## 2019-03-16 NOTE — Progress Notes (Signed)
Complaint:  Visit Type: Patient returns to my office for continued preventative foot care services. Complaint: Patient states" my nails have grown long and thick and become painful to walk and wear shoes" Patient has been diagnosed with DM with no foot complications. The patient presents for preventative foot care services. No changes to ROS  Podiatric Exam: Vascular: dorsalis pedis and posterior tibial pulses are palpable bilateral. Capillary return is immediate. Temperature gradient is WNL. Skin turgor WNL  Sensorium: Normal Semmes Weinstein monofilament test. Normal tactile sensation bilaterally. Nail Exam: Pt has thick disfigured discolored nails with subungual debris noted bilateral entire nail hallux through fifth toenails Ulcer Exam: There is no evidence of ulcer or pre-ulcerative changes or infection. Orthopedic Exam: Muscle tone and strength are WNL. No limitations in general ROM. No crepitus or effusions noted. Foot type and digits show no abnormalities. HAV  B/L Skin: No Porokeratosis. No infection or ulcers  Diagnosis:  Onychomycosis, , Pain in right toe, pain in left toes  Treatment & Plan Procedures and Treatment: Consent by patient was obtained for treatment procedures.   Debridement of mycotic and hypertrophic toenails, 1 through 5 bilateral and clearing of subungual debris. No ulceration, no infection noted.  Return Visit-Office Procedure: Patient instructed to return to the office for a follow up visit 10 weeks  for continued evaluation and treatment.    Gardiner Barefoot DPM

## 2019-04-05 ENCOUNTER — Ambulatory Visit: Payer: Medicare Other | Attending: Internal Medicine

## 2019-04-05 DIAGNOSIS — Z23 Encounter for immunization: Secondary | ICD-10-CM | POA: Insufficient documentation

## 2019-04-05 NOTE — Progress Notes (Signed)
   Covid-19 Vaccination Clinic  Name:  Jamie Mendez    MRN: [id number] DOB: [birth year]  04/05/2019  [name] was observed post Covid-19 immunization for 15 minutes without incident. She was provided with Vaccine Information Sheet and instruction to access the V-Safe system.   [name] was instructed to call 911 with any severe reactions post vaccine: Marland Kitchen Difficulty breathing  . Swelling of face and throat  . A fast heartbeat  . A bad rash all over body  . Dizziness and weakness   Immunizations Administered    Name Date Dose VIS Date Route   Pfizer COVID-19 Vaccine 04/05/2019  3:40 PM 0.3 mL 01/08/2019 Intramuscular   Manufacturer: [place]   Lot: [id number]   [place]: [id number]

## 2019-04-13 ENCOUNTER — Other Ambulatory Visit: Payer: Self-pay

## 2019-04-13 ENCOUNTER — Inpatient Hospital Stay: Payer: Medicare Other

## 2019-04-13 ENCOUNTER — Encounter: Payer: Self-pay | Admitting: Hematology and Oncology

## 2019-04-13 ENCOUNTER — Telehealth: Payer: Self-pay | Admitting: Hematology and Oncology

## 2019-04-13 ENCOUNTER — Inpatient Hospital Stay: Payer: Medicare Other | Attending: Hematology and Oncology | Admitting: Hematology and Oncology

## 2019-04-13 DIAGNOSIS — Z9221 Personal history of antineoplastic chemotherapy: Secondary | ICD-10-CM | POA: Diagnosis not present

## 2019-04-13 DIAGNOSIS — C8580 Other specified types of non-Hodgkin lymphoma, unspecified site: Secondary | ICD-10-CM

## 2019-04-13 DIAGNOSIS — Z95828 Presence of other vascular implants and grafts: Secondary | ICD-10-CM

## 2019-04-13 DIAGNOSIS — E1121 Type 2 diabetes mellitus with diabetic nephropathy: Secondary | ICD-10-CM | POA: Diagnosis not present

## 2019-04-13 DIAGNOSIS — I10 Essential (primary) hypertension: Secondary | ICD-10-CM | POA: Diagnosis not present

## 2019-04-13 DIAGNOSIS — Z8572 Personal history of non-Hodgkin lymphomas: Secondary | ICD-10-CM | POA: Diagnosis not present

## 2019-04-13 LAB — CBC WITH DIFFERENTIAL/PLATELET
Abs Immature Granulocytes: 0.46 10*3/uL — ABNORMAL HIGH (ref 0.00–0.07)
Basophils Absolute: 0.1 10*3/uL (ref 0.0–0.1)
Basophils Relative: 1 %
Eosinophils Absolute: 0.2 10*3/uL (ref 0.0–0.5)
Eosinophils Relative: 3 %
HCT: 33.6 % — ABNORMAL LOW (ref 36.0–46.0)
Hemoglobin: 11.3 g/dL — ABNORMAL LOW (ref 12.0–15.0)
Immature Granulocytes: 7 %
Lymphocytes Relative: 18 %
Lymphs Abs: 1.2 10*3/uL (ref 0.7–4.0)
MCH: 30.1 pg (ref 26.0–34.0)
MCHC: 33.6 g/dL (ref 30.0–36.0)
MCV: 89.4 fL (ref 80.0–100.0)
Monocytes Absolute: 0.6 10*3/uL (ref 0.1–1.0)
Monocytes Relative: 9 %
Neutro Abs: 4.1 10*3/uL (ref 1.7–7.7)
Neutrophils Relative %: 62 %
Platelets: 273 10*3/uL (ref 150–400)
RBC: 3.76 MIL/uL — ABNORMAL LOW (ref 3.87–5.11)
RDW: 12.7 % (ref 11.5–15.5)
WBC: 6.6 10*3/uL (ref 4.0–10.5)
nRBC: 0 % (ref 0.0–0.2)

## 2019-04-13 LAB — COMPREHENSIVE METABOLIC PANEL
ALT: 13 U/L (ref 0–44)
AST: 19 U/L (ref 15–41)
Albumin: 3.6 g/dL (ref 3.5–5.0)
Alkaline Phosphatase: 84 U/L (ref 38–126)
Anion gap: 10 (ref 5–15)
BUN: 27 mg/dL — ABNORMAL HIGH (ref 8–23)
CO2: 26 mmol/L (ref 22–32)
Calcium: 9.7 mg/dL (ref 8.9–10.3)
Chloride: 102 mmol/L (ref 98–111)
Creatinine, Ser: 1.42 mg/dL — ABNORMAL HIGH (ref 0.44–1.00)
GFR calc Af Amer: 39 mL/min — ABNORMAL LOW (ref >60)
GFR calc non Af Amer: 34 mL/min — ABNORMAL LOW (ref >60)
Glucose, Bld: 286 mg/dL — ABNORMAL HIGH (ref 70–99)
Potassium: 4.5 mmol/L (ref 3.5–5.1)
Sodium: 138 mmol/L (ref 135–145)
Total Bilirubin: 0.5 mg/dL (ref 0.3–1.2)
Total Protein: 6.3 g/dL — ABNORMAL LOW (ref 6.5–8.1)

## 2019-04-13 MED ORDER — SODIUM CHLORIDE 0.9% FLUSH
10.0000 mL | Freq: Once | INTRAVENOUS | Status: AC
  Administered 2019-04-13: 10 mL
  Filled 2019-04-13: qty 10

## 2019-04-13 MED ORDER — HEPARIN SOD (PORK) LOCK FLUSH 100 UNIT/ML IV SOLN
500.0000 [IU] | Freq: Once | INTRAVENOUS | Status: AC
  Administered 2019-04-13: 500 [IU]
  Filled 2019-04-13: qty 5

## 2019-04-13 NOTE — Progress Notes (Signed)
[facility] PROGRESS NOTE  Patient Care Team: [name], Bill Salinas, MD as PCP - General (Family Medicine) Sharyne Peach, MD as Consulting Physician (Ophthalmology) Inda Castle, MD (Inactive) as Consulting Physician (Gastroenterology) Sypher, Herbie Baltimore, MD (Inactive) as Consulting Physician (Orthopedic Surgery) Jannette Spanner, MD as Referring Physician (Dermatology) Heath Lark, MD as Consulting Physician (Hematology and Oncology) Alphonsa Overall, MD as Consulting Physician (General Surgery)  ASSESSMENT & PLAN:  Marginal zone lymphoma She has completed maintenance obinutuzumab except for the last treatment that was omitted due to pandemic concerns Her insurance will not pay for screening/surveillance imaging study Currently, she is not symptomatic and clinically, has no signs or symptoms to suggest cancer recurrence I recommend observation only with blood work, history and physical examination and she agreed I will see her back in 8 weeks for further follow-up   Type 2 diabetes mellitus with diabetic nephropathy She has poorly controlled diabetes I recommend close follow-up with primary care doctor for medical management  Essential hypertension she will continue current medical management. I recommend close follow-up with primary care doctor for medication adjustment.    No orders of the defined types were placed in this encounter.   All questions were answered. The patient knows to call the clinic with any problems, questions or concerns. The total time spent in the appointment was 15 minutes encounter with patients including review of chart and various tests results, discussions about plan of care and coordination of care plan   Heath Lark, MD 04/13/2019 4:37 PM  INTERVAL HISTORY: Please see below for problem oriented charting. She returns for lymphoma follow-up No new lymphadenopathy No recent infection, fever or chills  SUMMARY OF ONCOLOGIC  HISTORY: Oncology History Overview Note  Marginal zone lymphoma, with lymph node and bone marrow involvement along with splenomegaly   Primary site: Lymphoid Neoplasms   Staging method: AJCC 6th Edition   Clinical: Stage IV signed by Heath Lark, MD on 09/02/2013  7:38 PM   Summary: Stage IV     Marginal zone lymphoma ([place])  07/29/2013 Imaging   CT scan shows splenomegaly and abnormal lymphadenopathy.   08/13/2013 Imaging   PET CT scan confirmed lymphadenopathy and splenomegaly, those areas are PET avid   08/23/2013 Surgery   [id number] left axillary lymph node biopsy confirmed a low-grade lymphoma.   08/27/2013 Bone Marrow Biopsy   Bone marrow biopsy confirmed lymphoma.[id number]   09/08/2013 - 09/29/2013 Chemotherapy   She completed 4 cycles of rituximab with resolution of splenomegaly.   12/29/2013 Imaging   PET scan show complete response.   07/11/2014 Imaging   PET scan showed recurrent disease   07/19/2014 - 08/09/2014 Chemotherapy   She completed 4 cycles of rituximab with resolution of splenomegaly   10/12/2014 Imaging   Repeat PET/CT scan showed near complete response to treatment.   10/13/2014 - 02/09/2015 Chemotherapy   She is placed on rituximab maintenance therapy.   03/29/2015 Imaging   PET CT showed possible mild progression of splenomegaly   04/26/2015 - 02/22/2016 Chemotherapy   She started taking Ibrutinib   04/28/2015 Adverse Reaction   Treatment was placed temporarily on hold due to severe diarrhea and resumed at reduced dose   07/03/2015 PET scan   No hypermetabolic adenopathy in the neck, chest, abdomen or pelvis.2. Spleen is not hypermetabolic and appears slightly less prominent size.   02/06/2016 PET scan   Hypermetabolic LEFT axillary lymph nodes. Concern for HIGH-GRADE LYMPHOMA LOCALIZED RECURRENCE. 2. Prominent spleen without hypermetabolic activity. No change in  size from comparison. 3. No additional hypermetabolic adenopathy on scan.   02/19/2016 Procedure    Technically successful ultrasound-guided core biopsy, left axillary adenopathy.   02/19/2016 Pathology Results   Lymph node, needle/core biopsy, left axilla - ATYPICAL LYMPHOID PROLIFERATION SUSPICIOUS FOR LYMPHOMA. - SEE COMMENT. Microscopic Comment The sections show multiple very small and skinny fragments of lymph nodal tissue displaying areas of lymphoid expansion by a population of small to medium size lymphocytes with round to irregular nuclei, scanty to moderately abundant cytoplasm, dense chromatin and inconspicuous nucleoli. This is associated with scattering of "naked" germinal centers with abundant tingible body macrophages. To further evaluate this process, flow cytometric analysis was performed and shows a minor population of monoclonal B cells expressing pan B-cell antigens including CD20 with associated kappa light chain restriction. In addition, immunohistochemical stains were performed including CD20, CD79a, CD3, CD5, CD10, BCL-2, CD21 with appropriate controls. The stains show a mixture of T and B cells with slight predominance of B cells. CD21 highlights numerous areas with dendritic networks, correlating with the presence of germinal centers. CD10 shows focal positivity likely in germinal centers. BCL-2 is diffusely positive except in areas of apparent germinal centers. The overall features are atypical and suspicious for involvement by a B-cell lymphoproliferative process, particularly given the B-cell monoclonality by flow cytometry. However, morphologic evaluation is extremely limited due to small biopsy fragments and additional material (excisional biopsy) is strongly recommended for further evaluation. (BNS:ecj 02/21/2016)    03/04/2016 Procedure   Placement of single lumen port a cath via right internal jugular vein. The catheter tip lies at the cavo-atrial junction. A power injectable port a cath was placed and is ready for immediate use.   03/07/2016 - 05/31/2016 Chemotherapy    She received treatment with bendamustine and Gazyva   05/27/2016 PET scan   1. No evidence of active lymphoma on whole-body scan. 2. Resolution of metabolic activity previously identified in the LEFT axilla. 3. Small amount free fluid the pelvis.   03/07/2017 PET scan   1. No findings of active lymphoma in the chest, abdomen, or pelvis. 2. Other imaging findings of potential clinical significance: Aortic Atherosclerosis (ICD10-I70.0). Coronary atherosclerosis. Chronic paranasal sinusitis. Moderately distended gallbladder, chronic     REVIEW OF SYSTEMS:   Constitutional: Denies fevers, chills or abnormal weight loss Eyes: Denies blurriness of vision Ears, nose, mouth, throat, and face: Denies mucositis or sore throat Respiratory: Denies cough, dyspnea or wheezes Cardiovascular: Denies palpitation, chest discomfort or lower extremity swelling Gastrointestinal:  Denies nausea, heartburn or change in bowel habits Skin: Denies abnormal skin rashes Lymphatics: Denies new lymphadenopathy or easy bruising Neurological:Denies numbness, tingling or new weaknesses Behavioral/Psych: Mood is stable, no new changes  All other systems were reviewed with the patient and are negative.  I have reviewed the past medical history, past surgical history, social history and family history with the patient and they are unchanged from previous note.  ALLERGIES:  is allergic to aspirin; capoten [captopril]; levemir [insulin detemir]; micronase [glyburide]; onglyza [saxagliptin]; statins; and zocor [simvastatin].  MEDICATIONS:  Current Outpatient Medications  Medication Sig Dispense Refill  . Accu-Chek FastClix Lancets MISC USE 1 TO CHECK GLUCOSE ONCE DAILY    . acyclovir (ZOVIRAX) 400 MG tablet Take 1 tablet by mouth once daily 30 tablet 9  . amLODipine (NORVASC) 10 MG tablet TAKE 1 TABLET BY MOUTH AT BEDTIME 90 tablet 0  . aspirin 81 MG tablet Take 40.5 mg by mouth 3 (three) times a week.    Marland Kitchen  Calcium  Carbonate-Vit D-Min (CALCIUM 1200 PO) Take 1 tablet by mouth daily. Per patient takes in am and pm    . cholecalciferol (VITAMIN D) 1000 UNITS tablet Take 1,000 Units by mouth daily.     . enalapril (VASOTEC) 20 MG tablet Take 20 mg by mouth every morning.    . fish oil-omega-3 fatty acids 1000 MG capsule Take 1 g by mouth daily.     Marland Kitchen glimepiride (AMARYL) 4 MG tablet Take 4 mg by mouth daily with breakfast. Per patient takes in am and pm.    . glucose blood test strip Use as instructed 100 each 12  . lidocaine-prilocaine (EMLA) cream Apply 1 application topically as needed. 30 g 3  . loperamide (IMODIUM) 2 MG capsule Take by mouth as needed for diarrhea or loose stools.    . Magnesium 500 MG CAPS Take 500 mg by mouth daily.     . metFORMIN (GLUCOPHAGE) 1000 MG tablet Take 1,000 mg by mouth 2 (two) times daily with a meal.     . Misc Natural Products (LUTEIN 20 PO) Take by mouth daily.    . Multiple Vitamins-Minerals (CENTRUM CARDIO) TABS Take 1 tablet by mouth daily.    . Multiple Vitamins-Minerals (ICAPS) CAPS Take by mouth daily.     No current facility-administered medications for this visit.    PHYSICAL EXAMINATION: ECOG PERFORMANCE STATUS: 0 - Asymptomatic  Vitals:   04/13/19 1132  BP: (!) 172/66  Pulse: 87  Resp: 18  Temp: 98.2 F (36.8 C)  SpO2: 100%   Filed Weights   04/13/19 1132  Weight: 161 lb 3.2 oz (73.1 kg)    GENERAL:alert, no distress and comfortable SKIN: skin color, texture, turgor are normal, no rashes or significant lesions EYES: normal, Conjunctiva are pink and non-injected, sclera clear OROPHARYNX:no exudate, no erythema and lips, buccal mucosa, and tongue normal  NECK: supple, thyroid normal size, non-tender, without nodularity LYMPH:  no palpable lymphadenopathy in the cervical, axillary or inguinal LUNGS: clear to auscultation and percussion with normal breathing effort HEART: regular rate & rhythm and no murmurs and no lower extremity  edema ABDOMEN:abdomen soft, non-tender and normal bowel sounds Musculoskeletal:no cyanosis of digits and no clubbing  NEURO: alert & oriented x 3 with fluent speech, no focal motor/sensory deficits  LABORATORY DATA:  I have reviewed the data as listed    Component Value Date/Time   NA 138 04/13/2019 1029   NA 136 01/07/2017 1004   K 4.5 04/13/2019 1029   K 4.5 01/07/2017 1004   CL 102 04/13/2019 1029   CO2 26 04/13/2019 1029   CO2 23 01/07/2017 1004   GLUCOSE 286 (H) 04/13/2019 1029   GLUCOSE 217 (H) 01/07/2017 1004   BUN 27 (H) 04/13/2019 1029   BUN 23.3 01/07/2017 1004   CREATININE 1.42 (H) 04/13/2019 1029   CREATININE 1.37 (H) 07/06/2018 0959   CREATININE 1.2 (H) 01/07/2017 1004   CALCIUM 9.7 04/13/2019 1029   CALCIUM 9.4 01/07/2017 1004   PROT 6.3 (L) 04/13/2019 1029   PROT 6.7 01/07/2017 1004   ALBUMIN 3.6 04/13/2019 1029   ALBUMIN 3.9 01/07/2017 1004   AST 19 04/13/2019 1029   AST 15 07/06/2018 0959   AST 20 01/07/2017 1004   ALT 13 04/13/2019 1029   ALT 10 07/06/2018 0959   ALT 14 01/07/2017 1004   ALKPHOS 84 04/13/2019 1029   ALKPHOS 70 01/07/2017 1004   BILITOT 0.5 04/13/2019 1029   BILITOT 0.4 07/06/2018 0959   BILITOT  0.49 01/07/2017 1004   GFRNONAA 34 (L) 04/13/2019 1029   GFRNONAA 36 (L) 07/06/2018 0959   GFRNONAA 47 (L) 05/18/2013 1256   GFRAA 39 (L) 04/13/2019 1029   GFRAA 41 (L) 07/06/2018 0959   GFRAA 55 (L) 05/18/2013 1256    No results found for: SPEP, UPEP  Lab Results  Component Value Date   WBC 6.6 04/13/2019   NEUTROABS 4.1 04/13/2019   HGB 11.3 (L) 04/13/2019   HCT 33.6 (L) 04/13/2019   MCV 89.4 04/13/2019   PLT 273 04/13/2019      Chemistry      Component Value Date/Time   NA 138 04/13/2019 1029   NA 136 01/07/2017 1004   K 4.5 04/13/2019 1029   K 4.5 01/07/2017 1004   CL 102 04/13/2019 1029   CO2 26 04/13/2019 1029   CO2 23 01/07/2017 1004   BUN 27 (H) 04/13/2019 1029   BUN 23.3 01/07/2017 1004   CREATININE 1.42 (H)  04/13/2019 1029   CREATININE 1.37 (H) 07/06/2018 0959   CREATININE 1.2 (H) 01/07/2017 1004   GLU 202 (H) 08/09/2014 1000      Component Value Date/Time   CALCIUM 9.7 04/13/2019 1029   CALCIUM 9.4 01/07/2017 1004   ALKPHOS 84 04/13/2019 1029   ALKPHOS 70 01/07/2017 1004   AST 19 04/13/2019 1029   AST 15 07/06/2018 0959   AST 20 01/07/2017 1004   ALT 13 04/13/2019 1029   ALT 10 07/06/2018 0959   ALT 14 01/07/2017 1004   BILITOT 0.5 04/13/2019 1029   BILITOT 0.4 07/06/2018 0959   BILITOT 0.49 01/07/2017 1004       RADIOGRAPHIC STUDIES: I have personally reviewed the radiological images as listed and agreed with the findings in the report. No results found.

## 2019-04-13 NOTE — Assessment & Plan Note (Signed)
She has poorly controlled diabetes I recommend close follow-up with primary care doctor for medical management

## 2019-04-13 NOTE — Assessment & Plan Note (Signed)
She has completed maintenance obinutuzumab except for the last treatment that was omitted due to pandemic concerns Her insurance will not pay for screening/surveillance imaging study Currently, she is not symptomatic and clinically, has no signs or symptoms to suggest cancer recurrence I recommend observation only with blood work, history and physical examination and she agreed I will see her back in 8 weeks for further follow-up  

## 2019-04-13 NOTE — Telephone Encounter (Signed)
Scheduled per 3/16 sch msg. Called and spoke with pt, confirmed 5/11 appt

## 2019-04-13 NOTE — Assessment & Plan Note (Signed)
she will continue current medical management. I recommend close follow-up with primary care doctor for medication adjustment.  

## 2019-04-23 ENCOUNTER — Other Ambulatory Visit: Payer: Self-pay | Admitting: Hematology and Oncology

## 2019-04-23 DIAGNOSIS — C8580 Other specified types of non-Hodgkin lymphoma, unspecified site: Secondary | ICD-10-CM

## 2019-05-24 ENCOUNTER — Other Ambulatory Visit: Payer: Self-pay | Admitting: Hematology and Oncology

## 2019-05-24 ENCOUNTER — Telehealth: Payer: Self-pay

## 2019-05-24 DIAGNOSIS — I10 Essential (primary) hypertension: Secondary | ICD-10-CM

## 2019-05-24 NOTE — Telephone Encounter (Signed)
-----   Message from Heath Lark, MD sent at 05/24/2019  9:46 AM EDT ----- Regarding: amlodipine refill I think I refilled her medication once and now the pharmacy keeps sending refills to me Whenever I see her, her BP is high I am going to decline the amlodipine refill this time and I recommend she follows with PCP to medication review and changes

## 2019-05-24 NOTE — Telephone Encounter (Signed)
Called and left below message. Ask her to call the office if needed. 

## 2019-06-02 ENCOUNTER — Other Ambulatory Visit: Payer: Self-pay

## 2019-06-02 ENCOUNTER — Ambulatory Visit: Payer: Medicare Other | Admitting: Podiatry

## 2019-06-02 ENCOUNTER — Encounter: Payer: Self-pay | Admitting: Podiatry

## 2019-06-02 VITALS — Temp 98.6°F

## 2019-06-02 DIAGNOSIS — E1121 Type 2 diabetes mellitus with diabetic nephropathy: Secondary | ICD-10-CM

## 2019-06-02 DIAGNOSIS — M79674 Pain in right toe(s): Secondary | ICD-10-CM | POA: Diagnosis not present

## 2019-06-02 DIAGNOSIS — B351 Tinea unguium: Secondary | ICD-10-CM

## 2019-06-02 DIAGNOSIS — M79675 Pain in left toe(s): Secondary | ICD-10-CM | POA: Diagnosis not present

## 2019-06-02 DIAGNOSIS — N183 Chronic kidney disease, stage 3 unspecified: Secondary | ICD-10-CM

## 2019-06-02 NOTE — Progress Notes (Signed)
This patient returns to my office for at risk foot care.  This patient requires this care by a professional since this patient will be at risk due to having chronic kidney disease and type 2 diabetes.  This patient is unable to cut nails herself since the patient cannot reach her nails.These nails are painful walking and wearing shoes.  This patient presents for at risk foot care today.  General Appearance  Alert, conversant and in no acute stress.  Vascular  Dorsalis pedis and posterior tibial  pulses are palpable  bilaterally.  Capillary return is within normal limits  bilaterally. Temperature is within normal limits  bilaterally.  Neurologic  Senn-Weinstein monofilament wire test within normal limits  bilaterally. Muscle power within normal limits bilaterally.  Nails Thick disfigured discolored nails with subungual debris  from hallux to fifth toes bilaterally. No evidence of bacterial infection or drainage bilaterally.  Orthopedic  No limitations of motion  feet .  No crepitus or effusions noted.  No bony pathology or digital deformities noted.  HAV  B/L.  Skin  normotropic skin with no porokeratosis noted bilaterally.  No signs of infections or ulcers noted.     Onychomycosis  Pain in right toes  Pain in left toes  Consent was obtained for treatment procedures.   Mechanical debridement of nails 1-5  bilaterally performed with a nail nipper.  Filed with dremel without incident.    Return office visit   10 weeks                   Told patient to return for periodic foot care and evaluation due to potential at risk complications.   Alannah Averhart DPM  

## 2019-06-08 ENCOUNTER — Inpatient Hospital Stay: Payer: Medicare Other | Attending: Hematology and Oncology | Admitting: Hematology and Oncology

## 2019-06-08 ENCOUNTER — Encounter: Payer: Self-pay | Admitting: Hematology and Oncology

## 2019-06-08 ENCOUNTER — Inpatient Hospital Stay: Payer: Medicare Other

## 2019-06-08 ENCOUNTER — Telehealth: Payer: Self-pay | Admitting: Hematology and Oncology

## 2019-06-08 ENCOUNTER — Other Ambulatory Visit: Payer: Self-pay

## 2019-06-08 DIAGNOSIS — Z95828 Presence of other vascular implants and grafts: Secondary | ICD-10-CM

## 2019-06-08 DIAGNOSIS — N183 Chronic kidney disease, stage 3 unspecified: Secondary | ICD-10-CM | POA: Diagnosis not present

## 2019-06-08 DIAGNOSIS — I129 Hypertensive chronic kidney disease with stage 1 through stage 4 chronic kidney disease, or unspecified chronic kidney disease: Secondary | ICD-10-CM | POA: Insufficient documentation

## 2019-06-08 DIAGNOSIS — Z9221 Personal history of antineoplastic chemotherapy: Secondary | ICD-10-CM | POA: Insufficient documentation

## 2019-06-08 DIAGNOSIS — I10 Essential (primary) hypertension: Secondary | ICD-10-CM

## 2019-06-08 DIAGNOSIS — C8580 Other specified types of non-Hodgkin lymphoma, unspecified site: Secondary | ICD-10-CM | POA: Diagnosis not present

## 2019-06-08 DIAGNOSIS — Z8572 Personal history of non-Hodgkin lymphomas: Secondary | ICD-10-CM | POA: Diagnosis present

## 2019-06-08 LAB — COMPREHENSIVE METABOLIC PANEL
ALT: 14 U/L (ref 0–44)
AST: 20 U/L (ref 15–41)
Albumin: 3.6 g/dL (ref 3.5–5.0)
Alkaline Phosphatase: 71 U/L (ref 38–126)
Anion gap: 8 (ref 5–15)
BUN: 17 mg/dL (ref 8–23)
CO2: 26 mmol/L (ref 22–32)
Calcium: 9.6 mg/dL (ref 8.9–10.3)
Chloride: 105 mmol/L (ref 98–111)
Creatinine, Ser: 1.24 mg/dL — ABNORMAL HIGH (ref 0.44–1.00)
GFR calc Af Amer: 47 mL/min — ABNORMAL LOW (ref >60)
GFR calc non Af Amer: 40 mL/min — ABNORMAL LOW (ref >60)
Glucose, Bld: 125 mg/dL — ABNORMAL HIGH (ref 70–99)
Potassium: 4.2 mmol/L (ref 3.5–5.1)
Sodium: 139 mmol/L (ref 135–145)
Total Bilirubin: 0.6 mg/dL (ref 0.3–1.2)
Total Protein: 6.4 g/dL — ABNORMAL LOW (ref 6.5–8.1)

## 2019-06-08 LAB — CBC WITH DIFFERENTIAL/PLATELET
Abs Immature Granulocytes: 0.21 10*3/uL — ABNORMAL HIGH (ref 0.00–0.07)
Basophils Absolute: 0.1 10*3/uL (ref 0.0–0.1)
Basophils Relative: 1 %
Eosinophils Absolute: 0.1 10*3/uL (ref 0.0–0.5)
Eosinophils Relative: 2 %
HCT: 32.4 % — ABNORMAL LOW (ref 36.0–46.0)
Hemoglobin: 10.9 g/dL — ABNORMAL LOW (ref 12.0–15.0)
Immature Granulocytes: 4 %
Lymphocytes Relative: 19 %
Lymphs Abs: 1.1 10*3/uL (ref 0.7–4.0)
MCH: 30.4 pg (ref 26.0–34.0)
MCHC: 33.6 g/dL (ref 30.0–36.0)
MCV: 90.3 fL (ref 80.0–100.0)
Monocytes Absolute: 0.5 10*3/uL (ref 0.1–1.0)
Monocytes Relative: 8 %
Neutro Abs: 3.7 10*3/uL (ref 1.7–7.7)
Neutrophils Relative %: 66 %
Platelets: 255 10*3/uL (ref 150–400)
RBC: 3.59 MIL/uL — ABNORMAL LOW (ref 3.87–5.11)
RDW: 13.6 % (ref 11.5–15.5)
WBC: 5.6 10*3/uL (ref 4.0–10.5)
nRBC: 0 % (ref 0.0–0.2)

## 2019-06-08 MED ORDER — SODIUM CHLORIDE 0.9% FLUSH
10.0000 mL | Freq: Once | INTRAVENOUS | Status: AC
  Administered 2019-06-08: 10 mL
  Filled 2019-06-08: qty 10

## 2019-06-08 NOTE — Telephone Encounter (Signed)
Unable to reach pt. Left voicemail- Scheduled appts on 7/6. Per 5/11 sch msg.

## 2019-06-08 NOTE — Assessment & Plan Note (Signed)
she will continue current medical management. I recommend close follow-up with primary care doctor for medication adjustment.  

## 2019-06-08 NOTE — Assessment & Plan Note (Signed)
She has completed maintenance obinutuzumab except for the last treatment that was omitted due to pandemic concerns Her insurance will not pay for screening/surveillance imaging study Currently, she is not symptomatic and clinically, has no signs or symptoms to suggest cancer recurrence I recommend observation only with blood work, history and physical examination and she agreed I will see her back in 8 weeks for further follow-up  

## 2019-06-08 NOTE — Assessment & Plan Note (Signed)
Her kidney function tests is stable. Continue close monitoring and risk factor modification 

## 2019-06-08 NOTE — Progress Notes (Signed)
[facility] OFF[facility] PROGRESS NOTE  Patient Care Team: [name], Bill Salinas, MD as PCP - General (Family Medicine) Sharyne Peach, MD as Consulting Physician (Ophthalmology) Inda Castle, MD (Inactive) as Consulting Physician (Gastroenterology) Sypher, Herbie Baltimore, MD (Inactive) as Consulting Physician (Orthopedic Surgery) Jannette Spanner, MD as Referring Physician (Dermatology) Heath Lark, MD as Consulting Physician (Hematology and Oncology) Alphonsa Overall, MD as Consulting Physician (General Surgery)  ASSESSMENT & PLAN:  Marginal zone lymphoma She has completed maintenance obinutuzumab except for the last treatment that was omitted due to pandemic concerns Her insurance will not pay for screening/surveillance imaging study Currently, she is not symptomatic and clinically, has no signs or symptoms to suggest cancer recurrence I recommend observation only with blood work, history and physical examination and she agreed I will see her back in 8 weeks for further follow-up   Chronic kidney disease (CKD), stage III (moderate) Her kidney function tests is stable. Continue close monitoring and risk factor modification  Essential hypertension she will continue current medical management. I recommend close follow-up with primary care doctor for medication adjustment.    No orders of the defined types were placed in this encounter.   All questions were answered. The patient knows to call the clinic with any problems, questions or concerns. The total time spent in the appointment was 15 minutes encounter with patients including review of chart and various tests results, discussions about plan of care and coordination of care plan   Heath Lark, MD 06/08/2019 4:18 PM  INTERVAL HISTORY: Please see below for problem oriented charting. She returns for further follow-up She is doing well No recent infection, fever or chills No new lymphadenopathy  SUMMARY OF ONCOLOGIC  HISTORY: Oncology History Overview Note  Marginal zone lymphoma, with lymph node and bone marrow involvement along with splenomegaly   Primary site: Lymphoid Neoplasms   Staging method: AJCC 6th Edition   Clinical: Stage IV signed by Heath Lark, MD on 09/02/2013  7:38 PM   Summary: Stage IV     Marginal zone lymphoma ([place])  07/29/2013 Imaging   CT scan shows splenomegaly and abnormal lymphadenopathy.   08/13/2013 Imaging   PET CT scan confirmed lymphadenopathy and splenomegaly, those areas are PET avid   08/23/2013 Surgery   [id number] left axillary lymph node biopsy confirmed a low-grade lymphoma.   08/27/2013 Bone Marrow Biopsy   Bone marrow biopsy confirmed lymphoma.[id number]   09/08/2013 - 09/29/2013 Chemotherapy   She completed 4 cycles of rituximab with resolution of splenomegaly.   12/29/2013 Imaging   PET scan show complete response.   07/11/2014 Imaging   PET scan showed recurrent disease   07/19/2014 - 08/09/2014 Chemotherapy   She completed 4 cycles of rituximab with resolution of splenomegaly   10/12/2014 Imaging   Repeat PET/CT scan showed near complete response to treatment.   10/13/2014 - 02/09/2015 Chemotherapy   She is placed on rituximab maintenance therapy.   03/29/2015 Imaging   PET CT showed possible mild progression of splenomegaly   04/26/2015 - 02/22/2016 Chemotherapy   She started taking Ibrutinib   04/28/2015 Adverse Reaction   Treatment was placed temporarily on hold due to severe diarrhea and resumed at reduced dose   07/03/2015 PET scan   No hypermetabolic adenopathy in the neck, chest, abdomen or pelvis.2. Spleen is not hypermetabolic and appears slightly less prominent size.   02/06/2016 PET scan   Hypermetabolic LEFT axillary lymph nodes. Concern for HIGH-GRADE LYMPHOMA LOCALIZED RECURRENCE. 2. Prominent spleen without hypermetabolic activity. No change  in size from comparison. 3. No additional hypermetabolic adenopathy on scan.   02/19/2016 Procedure    Technically successful ultrasound-guided core biopsy, left axillary adenopathy.   02/19/2016 Pathology Results   Lymph node, needle/core biopsy, left axilla - ATYPICAL LYMPHOID PROLIFERATION SUSPICIOUS FOR LYMPHOMA. - SEE COMMENT. Microscopic Comment The sections show multiple very small and skinny fragments of lymph nodal tissue displaying areas of lymphoid expansion by a population of small to medium size lymphocytes with round to irregular nuclei, scanty to moderately abundant cytoplasm, dense chromatin and inconspicuous nucleoli. This is associated with scattering of "naked" germinal centers with abundant tingible body macrophages. To further evaluate this process, flow cytometric analysis was performed and shows a minor population of monoclonal B cells expressing pan B-cell antigens including CD20 with associated kappa light chain restriction. In addition, immunohistochemical stains were performed including CD20, CD79a, CD3, CD5, CD10, BCL-2, CD21 with appropriate controls. The stains show a mixture of T and B cells with slight predominance of B cells. CD21 highlights numerous areas with dendritic networks, correlating with the presence of germinal centers. CD10 shows focal positivity likely in germinal centers. BCL-2 is diffusely positive except in areas of apparent germinal centers. The overall features are atypical and suspicious for involvement by a B-cell lymphoproliferative process, particularly given the B-cell monoclonality by flow cytometry. However, morphologic evaluation is extremely limited due to small biopsy fragments and additional material (excisional biopsy) is strongly recommended for further evaluation. (BNS:ecj 02/21/2016)    03/04/2016 Procedure   Placement of single lumen port a cath via right internal jugular vein. The catheter tip lies at the cavo-atrial junction. A power injectable port a cath was placed and is ready for immediate use.   03/07/2016 - 05/31/2016 Chemotherapy    She received treatment with bendamustine and Gazyva   05/27/2016 PET scan   1. No evidence of active lymphoma on whole-body scan. 2. Resolution of metabolic activity previously identified in the LEFT axilla. 3. Small amount free fluid the pelvis.   03/07/2017 PET scan   1. No findings of active lymphoma in the chest, abdomen, or pelvis. 2. Other imaging findings of potential clinical significance: Aortic Atherosclerosis (ICD10-I70.0). Coronary atherosclerosis. Chronic paranasal sinusitis. Moderately distended gallbladder, chronic     REVIEW OF SYSTEMS:   Constitutional: Denies fevers, chills or abnormal weight loss Eyes: Denies blurriness of vision Ears, nose, mouth, throat, and face: Denies mucositis or sore throat Respiratory: Denies cough, dyspnea or wheezes Cardiovascular: Denies palpitation, chest discomfort or lower extremity swelling Gastrointestinal:  Denies nausea, heartburn or change in bowel habits Skin: Denies abnormal skin rashes Lymphatics: Denies new lymphadenopathy or easy bruising Neurological:Denies numbness, tingling or new weaknesses Behavioral/Psych: Mood is stable, no new changes  All other systems were reviewed with the patient and are negative.  I have reviewed the past medical history, past surgical history, social history and family history with the patient and they are unchanged from previous note.  ALLERGIES:  is allergic to aspirin; capoten [captopril]; levemir [insulin detemir]; micronase [glyburide]; onglyza [saxagliptin]; statins; and zocor [simvastatin].  MEDICATIONS:  Current Outpatient Medications  Medication Sig Dispense Refill  . Accu-Chek FastClix Lancets MISC USE 1 TO CHECK GLUCOSE ONCE DAILY    . acyclovir (ZOVIRAX) 400 MG tablet Take 1 tablet by mouth once daily 30 tablet 11  . amLODipine (NORVASC) 10 MG tablet TAKE 1 TABLET BY MOUTH AT BEDTIME 90 tablet 0  . aspirin 81 MG tablet Take 40.5 mg by mouth 3 (three) times a week.    Marland Kitchen  Blood Glucose  Monitoring Suppl (ACCU-CHEK GUIDE) w/Device KIT See admin instructions.    . Calcium Carbonate-Vit D-Min (CALCIUM 1200 PO) Take 1 tablet by mouth daily. Per patient takes in am and pm    . cholecalciferol (VITAMIN D) 1000 UNITS tablet Take 1,000 Units by mouth daily.     . enalapril (VASOTEC) 20 MG tablet Take 20 mg by mouth every morning.    . fish oil-omega-3 fatty acids 1000 MG capsule Take 1 g by mouth daily.     Marland Kitchen glimepiride (AMARYL) 4 MG tablet Take 4 mg by mouth daily with breakfast. Per patient takes in am and pm.    . glucose blood test strip Use as instructed 100 each 12  . lidocaine-prilocaine (EMLA) cream Apply 1 application topically as needed. 30 g 3  . loperamide (IMODIUM) 2 MG capsule Take by mouth as needed for diarrhea or loose stools.    . Magnesium 500 MG CAPS Take 500 mg by mouth daily.     . metFORMIN (GLUCOPHAGE) 1000 MG tablet Take 1,000 mg by mouth 2 (two) times daily with a meal.     . Misc Natural Products (LUTEIN 20 PO) Take by mouth daily.    . Multiple Vitamins-Minerals (CENTRUM CARDIO) TABS Take 1 tablet by mouth daily.    . Multiple Vitamins-Minerals (ICAPS) CAPS Take by mouth daily.     No current facility-administered medications for this visit.    PHYSICAL EXAMINATION: ECOG PERFORMANCE STATUS: 0 - Asymptomatic  Vitals:   06/08/19 1106  BP: (!) 170/62  Pulse: 77  Resp: 18  Temp: 98.9 F (37.2 C)  SpO2: 100%   Filed Weights   06/08/19 1106  Weight: 157 lb (71.2 kg)    GENERAL:alert, no distress and comfortable SKIN: skin color, texture, turgor are normal, no rashes or significant lesions EYES: normal, Conjunctiva are pink and non-injected, sclera clear OROPHARYNX:no exudate, no erythema and lips, buccal mucosa, and tongue normal  NECK: supple, thyroid normal size, non-tender, without nodularity LYMPH:  no palpable lymphadenopathy in the cervical, axillary or inguinal LUNGS: clear to auscultation and percussion with normal breathing  effort HEART: regular rate & rhythm and no murmurs and no lower extremity edema ABDOMEN:abdomen soft, non-tender and normal bowel sounds Musculoskeletal:no cyanosis of digits and no clubbing  NEURO: alert & oriented x 3 with fluent speech, no focal motor/sensory deficits  LABORATORY DATA:  I have reviewed the data as listed    Component Value Date/Time   NA 139 06/08/2019 1045   NA 136 01/07/2017 1004   K 4.2 06/08/2019 1045   K 4.5 01/07/2017 1004   CL 105 06/08/2019 1045   CO2 26 06/08/2019 1045   CO2 23 01/07/2017 1004   GLUCOSE 125 (H) 06/08/2019 1045   GLUCOSE 217 (H) 01/07/2017 1004   BUN 17 06/08/2019 1045   BUN 23.3 01/07/2017 1004   CREATININE 1.24 (H) 06/08/2019 1045   CREATININE 1.37 (H) 07/06/2018 0959   CREATININE 1.2 (H) 01/07/2017 1004   CALCIUM 9.6 06/08/2019 1045   CALCIUM 9.4 01/07/2017 1004   PROT 6.4 (L) 06/08/2019 1045   PROT 6.7 01/07/2017 1004   ALBUMIN 3.6 06/08/2019 1045   ALBUMIN 3.9 01/07/2017 1004   AST 20 06/08/2019 1045   AST 15 07/06/2018 0959   AST 20 01/07/2017 1004   ALT 14 06/08/2019 1045   ALT 10 07/06/2018 0959   ALT 14 01/07/2017 1004   ALKPHOS 71 06/08/2019 1045   ALKPHOS 70 01/07/2017 1004   BILITOT  0.6 06/08/2019 1045   BILITOT 0.4 07/06/2018 0959   BILITOT 0.49 01/07/2017 1004   GFRNONAA 40 (L) 06/08/2019 1045   GFRNONAA 36 (L) 07/06/2018 0959   GFRNONAA 47 (L) 05/18/2013 1256   GFRAA 47 (L) 06/08/2019 1045   GFRAA 41 (L) 07/06/2018 0959   GFRAA 55 (L) 05/18/2013 1256    No results found for: SPEP, UPEP  Lab Results  Component Value Date   WBC 5.6 06/08/2019   NEUTROABS 3.7 06/08/2019   HGB 10.9 (L) 06/08/2019   HCT 32.4 (L) 06/08/2019   MCV 90.3 06/08/2019   PLT 255 06/08/2019      Chemistry      Component Value Date/Time   NA 139 06/08/2019 1045   NA 136 01/07/2017 1004   K 4.2 06/08/2019 1045   K 4.5 01/07/2017 1004   CL 105 06/08/2019 1045   CO2 26 06/08/2019 1045   CO2 23 01/07/2017 1004   BUN 17  06/08/2019 1045   BUN 23.3 01/07/2017 1004   CREATININE 1.24 (H) 06/08/2019 1045   CREATININE 1.37 (H) 07/06/2018 0959   CREATININE 1.2 (H) 01/07/2017 1004   GLU 202 (H) 08/09/2014 1000      Component Value Date/Time   CALCIUM 9.6 06/08/2019 1045   CALCIUM 9.4 01/07/2017 1004   ALKPHOS 71 06/08/2019 1045   ALKPHOS 70 01/07/2017 1004   AST 20 06/08/2019 1045   AST 15 07/06/2018 0959   AST 20 01/07/2017 1004   ALT 14 06/08/2019 1045   ALT 10 07/06/2018 0959   ALT 14 01/07/2017 1004   BILITOT 0.6 06/08/2019 1045   BILITOT 0.4 07/06/2018 0959   BILITOT 0.49 01/07/2017 1004

## 2019-07-01 ENCOUNTER — Other Ambulatory Visit: Payer: Self-pay | Admitting: Family Medicine

## 2019-07-01 DIAGNOSIS — M810 Age-related osteoporosis without current pathological fracture: Secondary | ICD-10-CM

## 2019-07-01 DIAGNOSIS — Z1231 Encounter for screening mammogram for malignant neoplasm of breast: Secondary | ICD-10-CM

## 2019-07-29 ENCOUNTER — Other Ambulatory Visit: Payer: Self-pay

## 2019-07-29 ENCOUNTER — Ambulatory Visit (HOSPITAL_BASED_OUTPATIENT_CLINIC_OR_DEPARTMENT_OTHER)
Admission: RE | Admit: 2019-07-29 | Discharge: 2019-07-29 | Disposition: A | Discharge status: Home / Self Care | Payer: Medicare Other | Source: Ambulatory Visit | Attending: Family Medicine | Admitting: Family Medicine

## 2019-07-29 DIAGNOSIS — M810 Age-related osteoporosis without current pathological fracture: Secondary | ICD-10-CM

## 2019-07-29 DIAGNOSIS — Z1231 Encounter for screening mammogram for malignant neoplasm of breast: Secondary | ICD-10-CM

## 2019-08-03 ENCOUNTER — Other Ambulatory Visit: Payer: Medicare Other

## 2019-08-03 ENCOUNTER — Inpatient Hospital Stay: Payer: Medicare Other

## 2019-08-03 ENCOUNTER — Telehealth: Payer: Self-pay | Admitting: Hematology and Oncology

## 2019-08-03 ENCOUNTER — Inpatient Hospital Stay: Payer: Medicare Other | Attending: Hematology and Oncology | Admitting: Hematology and Oncology

## 2019-08-03 ENCOUNTER — Other Ambulatory Visit: Payer: Self-pay

## 2019-08-03 ENCOUNTER — Encounter: Payer: Self-pay | Admitting: Hematology and Oncology

## 2019-08-03 DIAGNOSIS — N183 Chronic kidney disease, stage 3 unspecified: Secondary | ICD-10-CM

## 2019-08-03 DIAGNOSIS — C4499 Other specified malignant neoplasm of skin, unspecified: Secondary | ICD-10-CM

## 2019-08-03 DIAGNOSIS — Z85828 Personal history of other malignant neoplasm of skin: Secondary | ICD-10-CM | POA: Diagnosis not present

## 2019-08-03 DIAGNOSIS — I129 Hypertensive chronic kidney disease with stage 1 through stage 4 chronic kidney disease, or unspecified chronic kidney disease: Secondary | ICD-10-CM | POA: Diagnosis not present

## 2019-08-03 DIAGNOSIS — I10 Essential (primary) hypertension: Secondary | ICD-10-CM | POA: Diagnosis not present

## 2019-08-03 DIAGNOSIS — C8580 Other specified types of non-Hodgkin lymphoma, unspecified site: Secondary | ICD-10-CM | POA: Diagnosis not present

## 2019-08-03 DIAGNOSIS — Z8572 Personal history of non-Hodgkin lymphomas: Secondary | ICD-10-CM | POA: Diagnosis present

## 2019-08-03 DIAGNOSIS — Z9221 Personal history of antineoplastic chemotherapy: Secondary | ICD-10-CM | POA: Diagnosis not present

## 2019-08-03 DIAGNOSIS — Z95828 Presence of other vascular implants and grafts: Secondary | ICD-10-CM

## 2019-08-03 LAB — COMPREHENSIVE METABOLIC PANEL
ALT: 13 U/L (ref 0–44)
AST: 19 U/L (ref 15–41)
Albumin: 3.7 g/dL (ref 3.5–5.0)
Alkaline Phosphatase: 80 U/L (ref 38–126)
Anion gap: 9 (ref 5–15)
BUN: 20 mg/dL (ref 8–23)
CO2: 25 mmol/L (ref 22–32)
Calcium: 9.8 mg/dL (ref 8.9–10.3)
Chloride: 104 mmol/L (ref 98–111)
Creatinine, Ser: 1.17 mg/dL — ABNORMAL HIGH (ref 0.44–1.00)
GFR calc Af Amer: 50 mL/min — ABNORMAL LOW (ref >60)
GFR calc non Af Amer: 43 mL/min — ABNORMAL LOW (ref >60)
Glucose, Bld: 78 mg/dL (ref 70–99)
Potassium: 4.2 mmol/L (ref 3.5–5.1)
Sodium: 138 mmol/L (ref 135–145)
Total Bilirubin: 0.6 mg/dL (ref 0.3–1.2)
Total Protein: 6.5 g/dL (ref 6.5–8.1)

## 2019-08-03 LAB — CBC WITH DIFFERENTIAL/PLATELET
Abs Immature Granulocytes: 0.35 10*3/uL — ABNORMAL HIGH (ref 0.00–0.07)
Basophils Absolute: 0.1 10*3/uL (ref 0.0–0.1)
Basophils Relative: 1 %
Eosinophils Absolute: 0.1 10*3/uL (ref 0.0–0.5)
Eosinophils Relative: 2 %
HCT: 32.8 % — ABNORMAL LOW (ref 36.0–46.0)
Hemoglobin: 11 g/dL — ABNORMAL LOW (ref 12.0–15.0)
Immature Granulocytes: 6 %
Lymphocytes Relative: 23 %
Lymphs Abs: 1.3 10*3/uL (ref 0.7–4.0)
MCH: 30.2 pg (ref 26.0–34.0)
MCHC: 33.5 g/dL (ref 30.0–36.0)
MCV: 90.1 fL (ref 80.0–100.0)
Monocytes Absolute: 0.5 10*3/uL (ref 0.1–1.0)
Monocytes Relative: 9 %
Neutro Abs: 3.4 10*3/uL (ref 1.7–7.7)
Neutrophils Relative %: 59 %
Platelets: 242 10*3/uL (ref 150–400)
RBC: 3.64 MIL/uL — ABNORMAL LOW (ref 3.87–5.11)
RDW: 13.2 % (ref 11.5–15.5)
WBC: 5.7 10*3/uL (ref 4.0–10.5)
nRBC: 0 % (ref 0.0–0.2)

## 2019-08-03 MED ORDER — SODIUM CHLORIDE 0.9% FLUSH
10.0000 mL | Freq: Once | INTRAVENOUS | Status: AC
  Administered 2019-08-03: 10 mL
  Filled 2019-08-03: qty 10

## 2019-08-03 MED ORDER — HEPARIN SOD (PORK) LOCK FLUSH 100 UNIT/ML IV SOLN
500.0000 [IU] | Freq: Once | INTRAVENOUS | Status: AC
  Administered 2019-08-03: 500 [IU]
  Filled 2019-08-03: qty 5

## 2019-08-03 NOTE — Assessment & Plan Note (Signed)
She has appointment to see dermatologist We discussed the importance of skin protection

## 2019-08-03 NOTE — Assessment & Plan Note (Signed)
She has completed maintenance obinutuzumab except for the last treatment that was omitted due to pandemic concerns Her insurance will not pay for screening/surveillance imaging study Currently, she is not symptomatic and clinically, has no signs or symptoms to suggest cancer recurrence I recommend observation only with blood work, history and physical examination and she agreed I will see her back in 8 weeks for further follow-up  

## 2019-08-03 NOTE — Progress Notes (Signed)
[facility] OFFICE PROGRESS NOTE  Patient Care Team: [name], Bill Salinas, MD as PCP - General (Family Medicine) Sharyne Peach, MD as Consulting Physician (Ophthalmology) Inda Castle, MD (Inactive) as Consulting Physician (Gastroenterology) Sypher, Herbie Baltimore, MD (Inactive) as Consulting Physician (Orthopedic Surgery) Jannette Spanner, MD as Referring Physician (Dermatology) Heath Lark, MD as Consulting Physician (Hematology and Oncology) Alphonsa Overall, MD as Consulting Physician (General Surgery)  ASSESSMENT & PLAN:  Marginal zone lymphoma She has completed maintenance obinutuzumab except for the last treatment that was omitted due to pandemic concerns Her insurance will not pay for screening/surveillance imaging study Currently, she is not symptomatic and clinically, has no signs or symptoms to suggest cancer recurrence I recommend observation only with blood work, history and physical examination and she agreed I will see her back in 8 weeks for further follow-up   Chronic kidney disease (CKD), stage III (moderate) Her kidney function tests is stable. Continue close monitoring and risk factor modification  Recurrent skin cancer She has appointment to see dermatologist We discussed the importance of skin protection  Essential hypertension Her blood pressure is elevated but according to the patient, her blood pressure control at home is satisfactory Observe closely for now   No orders of the defined types were placed in this encounter.   All questions were answered. The patient knows to call the clinic with any problems, questions or concerns. The total time spent in the appointment was 20 minutes encounter with patients including review of chart and various tests results, discussions about plan of care and coordination of care plan   Heath Lark, MD 08/03/2019 11:57 AM  INTERVAL HISTORY: Please see below for problem oriented charting. She returns for  further follow-up She had new skin lesion on her right forearm with appointment to see dermatologist pending She feels well otherwise No recent infection, fever or chills No new lymphadenopathy She remains active with activities of daily living and continues to go to the [facility] 3 times a week  SUMMARY OF ONCOLOGIC HISTORY: Oncology History Overview Note  Marginal zone lymphoma, with lymph node and bone marrow involvement along with splenomegaly   Primary site: Lymphoid Neoplasms   Staging method: AJCC 6th Edition   Clinical: Stage IV signed by Heath Lark, MD on 09/02/2013  7:38 PM   Summary: Stage IV     Marginal zone lymphoma ([place])  07/29/2013 Imaging   CT scan shows splenomegaly and abnormal lymphadenopathy.   08/13/2013 Imaging   PET CT scan confirmed lymphadenopathy and splenomegaly, those areas are PET avid   08/23/2013 Surgery   [id number] left axillary lymph node biopsy confirmed a low-grade lymphoma.   08/27/2013 Bone Marrow Biopsy   Bone marrow biopsy confirmed lymphoma.[id number]   09/08/2013 - 09/29/2013 Chemotherapy   She completed 4 cycles of rituximab with resolution of splenomegaly.   12/29/2013 Imaging   PET scan show complete response.   07/11/2014 Imaging   PET scan showed recurrent disease   07/19/2014 - 08/09/2014 Chemotherapy   She completed 4 cycles of rituximab with resolution of splenomegaly   10/12/2014 Imaging   Repeat PET/CT scan showed near complete response to treatment.   10/13/2014 - 02/09/2015 Chemotherapy   She is placed on rituximab maintenance therapy.   03/29/2015 Imaging   PET CT showed possible mild progression of splenomegaly   04/26/2015 - 02/22/2016 Chemotherapy   She started taking Ibrutinib   04/28/2015 Adverse Reaction   Treatment was placed temporarily on hold due to severe diarrhea and resumed at  reduced dose   07/03/2015 PET scan   No hypermetabolic adenopathy in the neck, chest, abdomen or pelvis.2. Spleen is not hypermetabolic and  appears slightly less prominent size.   02/06/2016 PET scan   Hypermetabolic LEFT axillary lymph nodes. Concern for HIGH-GRADE LYMPHOMA LOCALIZED RECURRENCE. 2. Prominent spleen without hypermetabolic activity. No change in size from comparison. 3. No additional hypermetabolic adenopathy on scan.   02/19/2016 Procedure   Technically successful ultrasound-guided core biopsy, left axillary adenopathy.   02/19/2016 Pathology Results   Lymph node, needle/core biopsy, left axilla - ATYPICAL LYMPHOID PROLIFERATION SUSPICIOUS FOR LYMPHOMA. - SEE COMMENT. Microscopic Comment The sections show multiple very small and skinny fragments of lymph nodal tissue displaying areas of lymphoid expansion by a population of small to medium size lymphocytes with round to irregular nuclei, scanty to moderately abundant cytoplasm, dense chromatin and inconspicuous nucleoli. This is associated with scattering of "naked" germinal centers with abundant tingible body macrophages. To further evaluate this process, flow cytometric analysis was performed and shows a minor population of monoclonal B cells expressing pan B-cell antigens including CD20 with associated kappa light chain restriction. In addition, immunohistochemical stains were performed including CD20, CD79a, CD3, CD5, CD10, BCL-2, CD21 with appropriate controls. The stains show a mixture of T and B cells with slight predominance of B cells. CD21 highlights numerous areas with dendritic networks, correlating with the presence of germinal centers. CD10 shows focal positivity likely in germinal centers. BCL-2 is diffusely positive except in areas of apparent germinal centers. The overall features are atypical and suspicious for involvement by a B-cell lymphoproliferative process, particularly given the B-cell monoclonality by flow cytometry. However, morphologic evaluation is extremely limited due to small biopsy fragments and additional material (excisional biopsy) is  strongly recommended for further evaluation. (BNS:ecj 02/21/2016)    03/04/2016 Procedure   Placement of single lumen port a cath via right internal jugular vein. The catheter tip lies at the cavo-atrial junction. A power injectable port a cath was placed and is ready for immediate use.   03/07/2016 - 05/31/2016 Chemotherapy   She received treatment with bendamustine and Gazyva   05/27/2016 PET scan   1. No evidence of active lymphoma on whole-body scan. 2. Resolution of metabolic activity previously identified in the LEFT axilla. 3. Small amount free fluid the pelvis.   03/07/2017 PET scan   1. No findings of active lymphoma in the chest, abdomen, or pelvis. 2. Other imaging findings of potential clinical significance: Aortic Atherosclerosis (ICD10-I70.0). Coronary atherosclerosis. Chronic paranasal sinusitis. Moderately distended gallbladder, chronic     REVIEW OF SYSTEMS:   Constitutional: Denies fevers, chills or abnormal weight loss Eyes: Denies blurriness of vision Ears, nose, mouth, throat, and face: Denies mucositis or sore throat Respiratory: Denies cough, dyspnea or wheezes Cardiovascular: Denies palpitation, chest discomfort or lower extremity swelling Gastrointestinal:  Denies nausea, heartburn or change in bowel habits Lymphatics: Denies new lymphadenopathy or easy bruising Neurological:Denies numbness, tingling or new weaknesses Behavioral/Psych: Mood is stable, no new changes  All other systems were reviewed with the patient and are negative.  I have reviewed the past medical history, past surgical history, social history and family history with the patient and they are unchanged from previous note.  ALLERGIES:  is allergic to aspirin, capoten [captopril], levemir [insulin detemir], micronase [glyburide], onglyza [saxagliptin], statins, and zocor [simvastatin].  MEDICATIONS:  Current Outpatient Medications  Medication Sig Dispense Refill  . Accu-Chek FastClix Lancets MISC  USE 1 TO CHECK GLUCOSE ONCE DAILY    .  acyclovir (ZOVIRAX) 400 MG tablet Take 1 tablet by mouth once daily 30 tablet 11  . amLODipine (NORVASC) 10 MG tablet TAKE 1 TABLET BY MOUTH AT BEDTIME 90 tablet 0  . aspirin 81 MG tablet Take 40.5 mg by mouth 3 (three) times a week.    . Blood Glucose Monitoring Suppl (ACCU-CHEK GUIDE) w/Device KIT See admin instructions.    . Calcium Carbonate-Vit D-Min (CALCIUM 1200 PO) Take 1 tablet by mouth daily. Per patient takes in am and pm    . cholecalciferol (VITAMIN D) 1000 UNITS tablet Take 1,000 Units by mouth daily.     . enalapril (VASOTEC) 20 MG tablet Take 20 mg by mouth every morning.    . fish oil-omega-3 fatty acids 1000 MG capsule Take 1 g by mouth daily.     Marland Kitchen glimepiride (AMARYL) 4 MG tablet Take 4 mg by mouth daily with breakfast. Per patient takes in am and pm.    . glucose blood test strip Use as instructed 100 each 12  . lidocaine-prilocaine (EMLA) cream Apply 1 application topically as needed. 30 g 3  . loperamide (IMODIUM) 2 MG capsule Take by mouth as needed for diarrhea or loose stools.    . Magnesium 500 MG CAPS Take 500 mg by mouth daily.     . metFORMIN (GLUCOPHAGE) 1000 MG tablet Take 1,000 mg by mouth 2 (two) times daily with a meal.     . Misc Natural Products (LUTEIN 20 PO) Take by mouth daily.    . Multiple Vitamins-Minerals (CENTRUM CARDIO) TABS Take 1 tablet by mouth daily.    . Multiple Vitamins-Minerals (ICAPS) CAPS Take by mouth daily.     No current facility-administered medications for this visit.    PHYSICAL EXAMINATION: ECOG PERFORMANCE STATUS: 1 - Symptomatic but completely ambulatory  Vitals:   08/03/19 1120  BP: (!) 182/78  Pulse: 74  Resp: 18  Temp: 98.3 F (36.8 C)  SpO2: 100%   Filed Weights   08/03/19 1120  Weight: 158 lb 9.6 oz (71.9 kg)    GENERAL:alert, no distress and comfortable SKIN noted multiple support keratosis EYES: normal, Conjunctiva are pink and non-injected, sclera  clear OROPHARYNX:no exudate, no erythema and lips, buccal mucosa, and tongue normal  NECK: supple, thyroid normal size, non-tender, without nodularity LYMPH:  no palpable lymphadenopathy in the cervical, axillary or inguinal LUNGS: clear to auscultation and percussion with normal breathing effort HEART: regular rate & rhythm and no murmurs and no lower extremity edema ABDOMEN:abdomen soft, non-tender and normal bowel sounds Musculoskeletal:no cyanosis of digits and no clubbing  NEURO: alert & oriented x 3 with fluent speech, no focal motor/sensory deficits  LABORATORY DATA:  I have reviewed the data as listed    Component Value Date/Time   NA 138 08/03/2019 1105   NA 136 01/07/2017 1004   K 4.2 08/03/2019 1105   K 4.5 01/07/2017 1004   CL 104 08/03/2019 1105   CO2 25 08/03/2019 1105   CO2 23 01/07/2017 1004   GLUCOSE 78 08/03/2019 1105   GLUCOSE 217 (H) 01/07/2017 1004   BUN 20 08/03/2019 1105   BUN 23.3 01/07/2017 1004   CREATININE 1.17 (H) 08/03/2019 1105   CREATININE 1.37 (H) 07/06/2018 0959   CREATININE 1.2 (H) 01/07/2017 1004   CALCIUM 9.8 08/03/2019 1105   CALCIUM 9.4 01/07/2017 1004   PROT 6.5 08/03/2019 1105   PROT 6.7 01/07/2017 1004   ALBUMIN 3.7 08/03/2019 1105   ALBUMIN 3.9 01/07/2017 1004   AST 19  08/03/2019 1105   AST 15 07/06/2018 0959   AST 20 01/07/2017 1004   ALT 13 08/03/2019 1105   ALT 10 07/06/2018 0959   ALT 14 01/07/2017 1004   ALKPHOS 80 08/03/2019 1105   ALKPHOS 70 01/07/2017 1004   BILITOT 0.6 08/03/2019 1105   BILITOT 0.4 07/06/2018 0959   BILITOT 0.49 01/07/2017 1004   GFRNONAA 43 (L) 08/03/2019 1105   GFRNONAA 36 (L) 07/06/2018 0959   GFRNONAA 47 (L) 05/18/2013 1256   GFRAA 50 (L) 08/03/2019 1105   GFRAA 41 (L) 07/06/2018 0959   GFRAA 55 (L) 05/18/2013 1256    No results found for: SPEP, UPEP  Lab Results  Component Value Date   WBC 5.7 08/03/2019   NEUTROABS 3.4 08/03/2019   HGB 11.0 (L) 08/03/2019   HCT 32.8 (L) 08/03/2019    MCV 90.1 08/03/2019   PLT 242 08/03/2019      Chemistry      Component Value Date/Time   NA 138 08/03/2019 1105   NA 136 01/07/2017 1004   K 4.2 08/03/2019 1105   K 4.5 01/07/2017 1004   CL 104 08/03/2019 1105   CO2 25 08/03/2019 1105   CO2 23 01/07/2017 1004   BUN 20 08/03/2019 1105   BUN 23.3 01/07/2017 1004   CREATININE 1.17 (H) 08/03/2019 1105   CREATININE 1.37 (H) 07/06/2018 0959   CREATININE 1.2 (H) 01/07/2017 1004   GLU 202 (H) 08/09/2014 1000      Component Value Date/Time   CALCIUM 9.8 08/03/2019 1105   CALCIUM 9.4 01/07/2017 1004   ALKPHOS 80 08/03/2019 1105   ALKPHOS 70 01/07/2017 1004   AST 19 08/03/2019 1105   AST 15 07/06/2018 0959   AST 20 01/07/2017 1004   ALT 13 08/03/2019 1105   ALT 10 07/06/2018 0959   ALT 14 01/07/2017 1004   BILITOT 0.6 08/03/2019 1105   BILITOT 0.4 07/06/2018 0959   BILITOT 0.49 01/07/2017 1004

## 2019-08-03 NOTE — Assessment & Plan Note (Signed)
Her kidney function tests is stable. Continue close monitoring and risk factor modification 

## 2019-08-03 NOTE — Telephone Encounter (Signed)
Scheduled appts per 7/6 sch msg. Gave pt a print out of AVS.  

## 2019-08-03 NOTE — Assessment & Plan Note (Signed)
Her blood pressure is elevated but according to the patient, her blood pressure control at home is satisfactory Observe closely for now 

## 2019-08-11 ENCOUNTER — Encounter: Payer: Self-pay | Admitting: Podiatry

## 2019-08-11 ENCOUNTER — Other Ambulatory Visit: Payer: Self-pay

## 2019-08-11 ENCOUNTER — Ambulatory Visit: Payer: Medicare Other | Admitting: Podiatry

## 2019-08-11 DIAGNOSIS — M79675 Pain in left toe(s): Secondary | ICD-10-CM | POA: Diagnosis not present

## 2019-08-11 DIAGNOSIS — B351 Tinea unguium: Secondary | ICD-10-CM

## 2019-08-11 DIAGNOSIS — E1121 Type 2 diabetes mellitus with diabetic nephropathy: Secondary | ICD-10-CM

## 2019-08-11 DIAGNOSIS — N183 Chronic kidney disease, stage 3 unspecified: Secondary | ICD-10-CM

## 2019-08-11 DIAGNOSIS — M79674 Pain in right toe(s): Secondary | ICD-10-CM

## 2019-08-11 NOTE — Progress Notes (Signed)
This patient returns to my office for at risk foot care.  This patient requires this care by a professional since this patient will be at risk due to having chronic kidney disease and type 2 diabetes.  This patient is unable to cut nails herself since the patient cannot reach her nails.These nails are painful walking and wearing shoes.  This patient presents for at risk foot care today.  General Appearance  Alert, conversant and in no acute stress.  Vascular  Dorsalis pedis and posterior tibial  pulses are palpable  bilaterally.  Capillary return is within normal limits  bilaterally. Temperature is within normal limits  bilaterally.  Neurologic  Senn-Weinstein monofilament wire test within normal limits  bilaterally. Muscle power within normal limits bilaterally.  Nails Thick disfigured discolored nails with subungual debris  from hallux to fifth toes bilaterally. No evidence of bacterial infection or drainage bilaterally.  Orthopedic  No limitations of motion  feet .  No crepitus or effusions noted.  No bony pathology or digital deformities noted.  HAV  B/L.  Skin  normotropic skin with no porokeratosis noted bilaterally.  No signs of infections or ulcers noted.     Onychomycosis  Pain in right toes  Pain in left toes  Consent was obtained for treatment procedures.   Mechanical debridement of nails 1-5  bilaterally performed with a nail nipper.  Filed with dremel without incident.    Return office visit   10 weeks                   Told patient to return for periodic foot care and evaluation due to potential at risk complications.   Yannet Rincon DPM  

## 2019-09-28 ENCOUNTER — Other Ambulatory Visit: Payer: Self-pay

## 2019-09-28 ENCOUNTER — Inpatient Hospital Stay: Payer: Medicare Other | Attending: Hematology and Oncology | Admitting: Hematology and Oncology

## 2019-09-28 ENCOUNTER — Inpatient Hospital Stay: Payer: Medicare Other

## 2019-09-28 ENCOUNTER — Encounter: Payer: Self-pay | Admitting: Hematology and Oncology

## 2019-09-28 DIAGNOSIS — E1121 Type 2 diabetes mellitus with diabetic nephropathy: Secondary | ICD-10-CM

## 2019-09-28 DIAGNOSIS — D630 Anemia in neoplastic disease: Secondary | ICD-10-CM | POA: Diagnosis not present

## 2019-09-28 DIAGNOSIS — C8580 Other specified types of non-Hodgkin lymphoma, unspecified site: Secondary | ICD-10-CM

## 2019-09-28 DIAGNOSIS — Z8572 Personal history of non-Hodgkin lymphomas: Secondary | ICD-10-CM | POA: Insufficient documentation

## 2019-09-28 DIAGNOSIS — Z95828 Presence of other vascular implants and grafts: Secondary | ICD-10-CM

## 2019-09-28 DIAGNOSIS — E1165 Type 2 diabetes mellitus with hyperglycemia: Secondary | ICD-10-CM | POA: Insufficient documentation

## 2019-09-28 DIAGNOSIS — E1122 Type 2 diabetes mellitus with diabetic chronic kidney disease: Secondary | ICD-10-CM | POA: Diagnosis not present

## 2019-09-28 DIAGNOSIS — N183 Chronic kidney disease, stage 3 unspecified: Secondary | ICD-10-CM | POA: Insufficient documentation

## 2019-09-28 LAB — COMPREHENSIVE METABOLIC PANEL
ALT: 9 U/L (ref 0–44)
AST: 16 U/L (ref 15–41)
Albumin: 3.4 g/dL — ABNORMAL LOW (ref 3.5–5.0)
Alkaline Phosphatase: 87 U/L (ref 38–126)
Anion gap: 7 (ref 5–15)
BUN: 23 mg/dL (ref 8–23)
CO2: 28 mmol/L (ref 22–32)
Calcium: 9.8 mg/dL (ref 8.9–10.3)
Chloride: 102 mmol/L (ref 98–111)
Creatinine, Ser: 1.41 mg/dL — ABNORMAL HIGH (ref 0.44–1.00)
GFR calc Af Amer: 40 mL/min — ABNORMAL LOW (ref >60)
GFR calc non Af Amer: 34 mL/min — ABNORMAL LOW (ref >60)
Glucose, Bld: 359 mg/dL — ABNORMAL HIGH (ref 70–99)
Potassium: 4.4 mmol/L (ref 3.5–5.1)
Sodium: 137 mmol/L (ref 135–145)
Total Bilirubin: 0.5 mg/dL (ref 0.3–1.2)
Total Protein: 6.2 g/dL — ABNORMAL LOW (ref 6.5–8.1)

## 2019-09-28 LAB — CBC WITH DIFFERENTIAL/PLATELET
Abs Immature Granulocytes: 0.37 10*3/uL — ABNORMAL HIGH (ref 0.00–0.07)
Basophils Absolute: 0.1 10*3/uL (ref 0.0–0.1)
Basophils Relative: 1 %
Eosinophils Absolute: 0.1 10*3/uL (ref 0.0–0.5)
Eosinophils Relative: 2 %
HCT: 31.7 % — ABNORMAL LOW (ref 36.0–46.0)
Hemoglobin: 10.6 g/dL — ABNORMAL LOW (ref 12.0–15.0)
Immature Granulocytes: 6 %
Lymphocytes Relative: 18 %
Lymphs Abs: 1.1 10*3/uL (ref 0.7–4.0)
MCH: 30.3 pg (ref 26.0–34.0)
MCHC: 33.4 g/dL (ref 30.0–36.0)
MCV: 90.6 fL (ref 80.0–100.0)
Monocytes Absolute: 0.5 10*3/uL (ref 0.1–1.0)
Monocytes Relative: 9 %
Neutro Abs: 3.7 10*3/uL (ref 1.7–7.7)
Neutrophils Relative %: 64 %
Platelets: 252 10*3/uL (ref 150–400)
RBC: 3.5 MIL/uL — ABNORMAL LOW (ref 3.87–5.11)
RDW: 12.9 % (ref 11.5–15.5)
WBC: 5.8 10*3/uL (ref 4.0–10.5)
nRBC: 0 % (ref 0.0–0.2)

## 2019-09-28 MED ORDER — SODIUM CHLORIDE 0.9% FLUSH
10.0000 mL | Freq: Once | INTRAVENOUS | Status: AC
  Administered 2019-09-28: 10 mL
  Filled 2019-09-28: qty 10

## 2019-09-28 MED ORDER — HEPARIN SOD (PORK) LOCK FLUSH 100 UNIT/ML IV SOLN
250.0000 [IU] | Freq: Once | INTRAVENOUS | Status: AC
  Administered 2019-09-28: 500 [IU]
  Filled 2019-09-28: qty 5

## 2019-09-28 NOTE — Assessment & Plan Note (Signed)
She has multifactorial anemia, likely anemia of chronic illness She is not symptomatic Observe only.

## 2019-09-28 NOTE — Progress Notes (Signed)
[facility] [facility] OFFICE PROGRESS NOTE  Patient Care Team: [name], Bill Salinas, MD as PCP - General (Family Medicine) Sharyne Peach, MD as Consulting Physician (Ophthalmology) Inda Castle, MD (Inactive) as Consulting Physician (Gastroenterology) Sypher, Herbie Baltimore, MD (Inactive) as Consulting Physician (Orthopedic Surgery) Jannette Spanner, MD as Referring Physician (Dermatology) Heath Lark, MD as Consulting Physician (Hematology and Oncology) Alphonsa Overall, MD as Consulting Physician (General Surgery)  ASSESSMENT & PLAN:  Marginal zone lymphoma She has completed maintenance obinutuzumab except for the last treatment that was omitted due to pandemic concerns Her insurance will not pay for screening/surveillance imaging study Currently, she is not symptomatic and clinically, has no signs or symptoms to suggest cancer recurrence I recommend observation only with blood work, history and physical examination and she agreed I will see her back in 8 weeks for further follow-up   Chronic kidney disease (CKD), stage III (moderate) Her kidney function tests is stable. Continue close monitoring and risk factor modification  Anemia in neoplastic disease She has multifactorial anemia, likely anemia of chronic illness She is not symptomatic Observe only.  Type 2 diabetes mellitus with diabetic nephropathy She has poorly controlled diabetes I recommend close follow-up with primary care doctor for medical management We discussed the importance of dietary modification   No orders of the defined types were placed in this encounter.   All questions were answered. The patient knows to call the clinic with any problems, questions or concerns. The total time spent in the appointment was 20 minutes encounter with patients including review of chart and various tests results, discussions about plan of care and coordination of care plan   Heath Lark, MD 09/28/2019 2:48 PM  INTERVAL  HISTORY: Please see below for problem oriented charting. She returns for further follow-up Denies recent lymphadenopathy No recent infection, fever or chills Her appetite is fair She is undergoing a lot of stress because her son is sick  SUMMARY OF ONCOLOGIC HISTORY: Oncology History Overview Note  Marginal zone lymphoma, with lymph node and bone marrow involvement along with splenomegaly   Primary site: Lymphoid Neoplasms   Staging method: AJCC 6th Edition   Clinical: Stage IV signed by Heath Lark, MD on 09/02/2013  7:38 PM   Summary: Stage IV     Marginal zone lymphoma ([place])  07/29/2013 Imaging   CT scan shows splenomegaly and abnormal lymphadenopathy.   08/13/2013 Imaging   PET CT scan confirmed lymphadenopathy and splenomegaly, those areas are PET avid   08/23/2013 Surgery   [id number] left axillary lymph node biopsy confirmed a low-grade lymphoma.   08/27/2013 Bone Marrow Biopsy   Bone marrow biopsy confirmed lymphoma.[id number]   09/08/2013 - 09/29/2013 Chemotherapy   She completed 4 cycles of rituximab with resolution of splenomegaly.   12/29/2013 Imaging   PET scan show complete response.   07/11/2014 Imaging   PET scan showed recurrent disease   07/19/2014 - 08/09/2014 Chemotherapy   She completed 4 cycles of rituximab with resolution of splenomegaly   10/12/2014 Imaging   Repeat PET/CT scan showed near complete response to treatment.   10/13/2014 - 02/09/2015 Chemotherapy   She is placed on rituximab maintenance therapy.   03/29/2015 Imaging   PET CT showed possible mild progression of splenomegaly   04/26/2015 - 02/22/2016 Chemotherapy   She started taking Ibrutinib   04/28/2015 Adverse Reaction   Treatment was placed temporarily on hold due to severe diarrhea and resumed at reduced dose   07/03/2015 PET scan   No hypermetabolic adenopathy in  the neck, chest, abdomen or pelvis.2. Spleen is not hypermetabolic and appears slightly less prominent size.   02/06/2016 PET scan    Hypermetabolic LEFT axillary lymph nodes. Concern for HIGH-GRADE LYMPHOMA LOCALIZED RECURRENCE. 2. Prominent spleen without hypermetabolic activity. No change in size from comparison. 3. No additional hypermetabolic adenopathy on scan.   02/19/2016 Procedure   Technically successful ultrasound-guided core biopsy, left axillary adenopathy.   02/19/2016 Pathology Results   Lymph node, needle/core biopsy, left axilla - ATYPICAL LYMPHOID PROLIFERATION SUSPICIOUS FOR LYMPHOMA. - SEE COMMENT. Microscopic Comment The sections show multiple very small and skinny fragments of lymph nodal tissue displaying areas of lymphoid expansion by a population of small to medium size lymphocytes with round to irregular nuclei, scanty to moderately abundant cytoplasm, dense chromatin and inconspicuous nucleoli. This is associated with scattering of "naked" germinal centers with abundant tingible body macrophages. To further evaluate this process, flow cytometric analysis was performed and shows a minor population of monoclonal B cells expressing pan B-cell antigens including CD20 with associated kappa light chain restriction. In addition, immunohistochemical stains were performed including CD20, CD79a, CD3, CD5, CD10, BCL-2, CD21 with appropriate controls. The stains show a mixture of T and B cells with slight predominance of B cells. CD21 highlights numerous areas with dendritic networks, correlating with the presence of germinal centers. CD10 shows focal positivity likely in germinal centers. BCL-2 is diffusely positive except in areas of apparent germinal centers. The overall features are atypical and suspicious for involvement by a B-cell lymphoproliferative process, particularly given the B-cell monoclonality by flow cytometry. However, morphologic evaluation is extremely limited due to small biopsy fragments and additional material (excisional biopsy) is strongly recommended for further evaluation. (BNS:ecj  02/21/2016)    03/04/2016 Procedure   Placement of single lumen port a cath via right internal jugular vein. The catheter tip lies at the cavo-atrial junction. A power injectable port a cath was placed and is ready for immediate use.   03/07/2016 - 05/31/2016 Chemotherapy   She received treatment with bendamustine and Gazyva   05/27/2016 PET scan   1. No evidence of active lymphoma on whole-body scan. 2. Resolution of metabolic activity previously identified in the LEFT axilla. 3. Small amount free fluid the pelvis.   03/07/2017 PET scan   1. No findings of active lymphoma in the chest, abdomen, or pelvis. 2. Other imaging findings of potential clinical significance: Aortic Atherosclerosis (ICD10-I70.0). Coronary atherosclerosis. Chronic paranasal sinusitis. Moderately distended gallbladder, chronic     REVIEW OF SYSTEMS:   Constitutional: Denies fevers, chills or abnormal weight loss Eyes: Denies blurriness of vision Ears, nose, mouth, throat, and face: Denies mucositis or sore throat Respiratory: Denies cough, dyspnea or wheezes Cardiovascular: Denies palpitation, chest discomfort or lower extremity swelling Gastrointestinal:  Denies nausea, heartburn or change in bowel habits Skin: Denies abnormal skin rashes Lymphatics: Denies new lymphadenopathy or easy bruising Neurological:Denies numbness, tingling or new weaknesses Behavioral/Psych: Mood is stable, no new changes  All other systems were reviewed with the patient and are negative.  I have reviewed the past medical history, past surgical history, social history and family history with the patient and they are unchanged from previous note.  ALLERGIES:  is allergic to aspirin, capoten [captopril], levemir [insulin detemir], micronase [glyburide], onglyza [saxagliptin], statins, and zocor [simvastatin].  MEDICATIONS:  Current Outpatient Medications  Medication Sig Dispense Refill  . Accu-Chek FastClix Lancets MISC USE 1 TO CHECK  GLUCOSE ONCE DAILY    . acyclovir (ZOVIRAX) 400 MG tablet Take 1 tablet  by mouth once daily 30 tablet 11  . amLODipine (NORVASC) 10 MG tablet TAKE 1 TABLET BY MOUTH AT BEDTIME 90 tablet 0  . aspirin 81 MG tablet Take 40.5 mg by mouth 3 (three) times a week.    . Blood Glucose Monitoring Suppl (ACCU-CHEK GUIDE) w/Device KIT See admin instructions.    . Calcium Carbonate-Vit D-Min (CALCIUM 1200 PO) Take 1 tablet by mouth daily. Per patient takes in am and pm    . cholecalciferol (VITAMIN D) 1000 UNITS tablet Take 1,000 Units by mouth daily.     . enalapril (VASOTEC) 20 MG tablet Take 20 mg by mouth every morning.    . fish oil-omega-3 fatty acids 1000 MG capsule Take 1 g by mouth daily.     Marland Kitchen glimepiride (AMARYL) 4 MG tablet Take 4 mg by mouth daily with breakfast. Per patient takes in am and pm.    . glucose blood test strip Use as instructed 100 each 12  . lidocaine-prilocaine (EMLA) cream Apply 1 application topically as needed. 30 g 3  . loperamide (IMODIUM) 2 MG capsule Take by mouth as needed for diarrhea or loose stools.    . Magnesium 500 MG CAPS Take 500 mg by mouth daily.     . metFORMIN (GLUCOPHAGE) 1000 MG tablet Take 1,000 mg by mouth 2 (two) times daily with a meal.     . Misc Natural Products (LUTEIN 20 PO) Take by mouth daily.    . Multiple Vitamins-Minerals (CENTRUM CARDIO) TABS Take 1 tablet by mouth daily.    . Multiple Vitamins-Minerals (ICAPS) CAPS Take by mouth daily.     No current facility-administered medications for this visit.    PHYSICAL EXAMINATION: ECOG PERFORMANCE STATUS: 1 - Symptomatic but completely ambulatory  Vitals:   09/28/19 1104  BP: (!) 152/74  Pulse: 75  Resp: 18  Temp: (!) 97.2 F (36.2 C)  SpO2: 100%   Filed Weights   09/28/19 1104  Weight: 162 lb (73.5 kg)    GENERAL:alert, no distress and comfortable SKIN: skin color, texture, turgor are normal, no rashes or significant lesions EYES: normal, Conjunctiva are pink and non-injected,  sclera clear OROPHARYNX:no exudate, no erythema and lips, buccal mucosa, and tongue normal  NECK: supple, thyroid normal size, non-tender, without nodularity LYMPH:  no palpable lymphadenopathy in the cervical, axillary or inguinal LUNGS: clear to auscultation and percussion with normal breathing effort HEART: regular rate & rhythm and no murmurs and no lower extremity edema ABDOMEN:abdomen soft, non-tender and normal bowel sounds Musculoskeletal:no cyanosis of digits and no clubbing  NEURO: alert & oriented x 3 with fluent speech, no focal motor/sensory deficits  LABORATORY DATA:  I have reviewed the data as listed    Component Value Date/Time   NA 137 09/28/2019 1053   NA 136 01/07/2017 1004   K 4.4 09/28/2019 1053   K 4.5 01/07/2017 1004   CL 102 09/28/2019 1053   CO2 28 09/28/2019 1053   CO2 23 01/07/2017 1004   GLUCOSE 359 (H) 09/28/2019 1053   GLUCOSE 217 (H) 01/07/2017 1004   BUN 23 09/28/2019 1053   BUN 23.3 01/07/2017 1004   CREATININE 1.41 (H) 09/28/2019 1053   CREATININE 1.37 (H) 07/06/2018 0959   CREATININE 1.2 (H) 01/07/2017 1004   CALCIUM 9.8 09/28/2019 1053   CALCIUM 9.4 01/07/2017 1004   PROT 6.2 (L) 09/28/2019 1053   PROT 6.7 01/07/2017 1004   ALBUMIN 3.4 (L) 09/28/2019 1053   ALBUMIN 3.9 01/07/2017 1004   AST  16 09/28/2019 1053   AST 15 07/06/2018 0959   AST 20 01/07/2017 1004   ALT 9 09/28/2019 1053   ALT 10 07/06/2018 0959   ALT 14 01/07/2017 1004   ALKPHOS 87 09/28/2019 1053   ALKPHOS 70 01/07/2017 1004   BILITOT 0.5 09/28/2019 1053   BILITOT 0.4 07/06/2018 0959   BILITOT 0.49 01/07/2017 1004   GFRNONAA 34 (L) 09/28/2019 1053   GFRNONAA 36 (L) 07/06/2018 0959   GFRNONAA 47 (L) 05/18/2013 1256   GFRAA 40 (L) 09/28/2019 1053   GFRAA 41 (L) 07/06/2018 0959   GFRAA 55 (L) 05/18/2013 1256    No results found for: SPEP, UPEP  Lab Results  Component Value Date   WBC 5.8 09/28/2019   NEUTROABS 3.7 09/28/2019   HGB 10.6 (L) 09/28/2019   HCT 31.7  (L) 09/28/2019   MCV 90.6 09/28/2019   PLT 252 09/28/2019      Chemistry      Component Value Date/Time   NA 137 09/28/2019 1053   NA 136 01/07/2017 1004   K 4.4 09/28/2019 1053   K 4.5 01/07/2017 1004   CL 102 09/28/2019 1053   CO2 28 09/28/2019 1053   CO2 23 01/07/2017 1004   BUN 23 09/28/2019 1053   BUN 23.3 01/07/2017 1004   CREATININE 1.41 (H) 09/28/2019 1053   CREATININE 1.37 (H) 07/06/2018 0959   CREATININE 1.2 (H) 01/07/2017 1004   GLU 202 (H) 08/09/2014 1000      Component Value Date/Time   CALCIUM 9.8 09/28/2019 1053   CALCIUM 9.4 01/07/2017 1004   ALKPHOS 87 09/28/2019 1053   ALKPHOS 70 01/07/2017 1004   AST 16 09/28/2019 1053   AST 15 07/06/2018 0959   AST 20 01/07/2017 1004   ALT 9 09/28/2019 1053   ALT 10 07/06/2018 0959   ALT 14 01/07/2017 1004   BILITOT 0.5 09/28/2019 1053   BILITOT 0.4 07/06/2018 0959   BILITOT 0.49 01/07/2017 1004

## 2019-09-28 NOTE — Assessment & Plan Note (Signed)
She has completed maintenance obinutuzumab except for the last treatment that was omitted due to pandemic concerns Her insurance will not pay for screening/surveillance imaging study Currently, she is not symptomatic and clinically, has no signs or symptoms to suggest cancer recurrence I recommend observation only with blood work, history and physical examination and she agreed I will see her back in 8 weeks for further follow-up  

## 2019-09-28 NOTE — Assessment & Plan Note (Signed)
She has poorly controlled diabetes I recommend close follow-up with primary care doctor for medical management We discussed the importance of dietary modification

## 2019-09-28 NOTE — Patient Instructions (Signed)

## 2019-09-28 NOTE — Assessment & Plan Note (Signed)
Her kidney function tests is stable. Continue close monitoring and risk factor modification 

## 2019-11-03 ENCOUNTER — Ambulatory Visit: Payer: Medicare Other | Admitting: Podiatry

## 2019-11-03 ENCOUNTER — Encounter: Payer: Self-pay | Admitting: Podiatry

## 2019-11-03 ENCOUNTER — Other Ambulatory Visit: Payer: Self-pay

## 2019-11-03 DIAGNOSIS — M79674 Pain in right toe(s): Secondary | ICD-10-CM

## 2019-11-03 DIAGNOSIS — N183 Chronic kidney disease, stage 3 unspecified: Secondary | ICD-10-CM

## 2019-11-03 DIAGNOSIS — B351 Tinea unguium: Secondary | ICD-10-CM | POA: Diagnosis not present

## 2019-11-03 DIAGNOSIS — E1121 Type 2 diabetes mellitus with diabetic nephropathy: Secondary | ICD-10-CM

## 2019-11-03 DIAGNOSIS — M79675 Pain in left toe(s): Secondary | ICD-10-CM

## 2019-11-03 NOTE — Progress Notes (Signed)
This patient returns to my office for at risk foot care.  This patient requires this care by a professional since this patient will be at risk due to having chronic kidney disease and type 2 diabetes.  This patient is unable to cut nails herself since the patient cannot reach her nails.These nails are painful walking and wearing shoes.  This patient presents for at risk foot care today.  General Appearance  Alert, conversant and in no acute stress.  Vascular  Dorsalis pedis and posterior tibial  pulses are palpable  bilaterally.  Capillary return is within normal limits  bilaterally. Temperature is within normal limits  bilaterally.  Neurologic  Senn-Weinstein monofilament wire test within normal limits  bilaterally. Muscle power within normal limits bilaterally.  Nails Thick disfigured discolored nails with subungual debris  from hallux to fifth toes bilaterally. No evidence of bacterial infection or drainage bilaterally.  Orthopedic  No limitations of motion  feet .  No crepitus or effusions noted.  No bony pathology or digital deformities noted.  HAV  B/L.  Skin  normotropic skin with no porokeratosis noted bilaterally.  No signs of infections or ulcers noted.     Onychomycosis  Pain in right toes  Pain in left toes  Consent was obtained for treatment procedures.   Mechanical debridement of nails 1-5  bilaterally performed with a nail nipper.  Filed with dremel without incident.    Return office visit   10 weeks                   Told patient to return for periodic foot care and evaluation due to potential at risk complications.   Annora Guderian DPM  

## 2019-11-23 ENCOUNTER — Inpatient Hospital Stay: Payer: Medicare Other | Attending: Hematology and Oncology | Admitting: Hematology and Oncology

## 2019-11-23 ENCOUNTER — Inpatient Hospital Stay: Payer: Medicare Other

## 2019-11-23 ENCOUNTER — Encounter: Payer: Self-pay | Admitting: Hematology and Oncology

## 2019-11-23 ENCOUNTER — Other Ambulatory Visit: Payer: Self-pay

## 2019-11-23 VITALS — BP 174/66 | HR 76 | Temp 97.7°F | Resp 18 | Ht 64.0 in

## 2019-11-23 DIAGNOSIS — Z8572 Personal history of non-Hodgkin lymphomas: Secondary | ICD-10-CM | POA: Insufficient documentation

## 2019-11-23 DIAGNOSIS — D539 Nutritional anemia, unspecified: Secondary | ICD-10-CM | POA: Diagnosis not present

## 2019-11-23 DIAGNOSIS — C8580 Other specified types of non-Hodgkin lymphoma, unspecified site: Secondary | ICD-10-CM | POA: Diagnosis not present

## 2019-11-23 DIAGNOSIS — D61818 Other pancytopenia: Secondary | ICD-10-CM | POA: Diagnosis not present

## 2019-11-23 DIAGNOSIS — I10 Essential (primary) hypertension: Secondary | ICD-10-CM

## 2019-11-23 DIAGNOSIS — Z95828 Presence of other vascular implants and grafts: Secondary | ICD-10-CM

## 2019-11-23 DIAGNOSIS — Z9221 Personal history of antineoplastic chemotherapy: Secondary | ICD-10-CM | POA: Diagnosis not present

## 2019-11-23 DIAGNOSIS — I129 Hypertensive chronic kidney disease with stage 1 through stage 4 chronic kidney disease, or unspecified chronic kidney disease: Secondary | ICD-10-CM | POA: Diagnosis not present

## 2019-11-23 DIAGNOSIS — N183 Chronic kidney disease, stage 3 unspecified: Secondary | ICD-10-CM | POA: Diagnosis not present

## 2019-11-23 LAB — CBC WITH DIFFERENTIAL/PLATELET
Abs Immature Granulocytes: 0.21 10*3/uL — ABNORMAL HIGH (ref 0.00–0.07)
Basophils Absolute: 0.1 10*3/uL (ref 0.0–0.1)
Basophils Relative: 1 %
Eosinophils Absolute: 0.3 10*3/uL (ref 0.0–0.5)
Eosinophils Relative: 5 %
HCT: 32.2 % — ABNORMAL LOW (ref 36.0–46.0)
Hemoglobin: 10.7 g/dL — ABNORMAL LOW (ref 12.0–15.0)
Immature Granulocytes: 4 %
Lymphocytes Relative: 21 %
Lymphs Abs: 1.3 10*3/uL (ref 0.7–4.0)
MCH: 29.3 pg (ref 26.0–34.0)
MCHC: 33.2 g/dL (ref 30.0–36.0)
MCV: 88.2 fL (ref 80.0–100.0)
Monocytes Absolute: 0.5 10*3/uL (ref 0.1–1.0)
Monocytes Relative: 8 %
Neutro Abs: 3.6 10*3/uL (ref 1.7–7.7)
Neutrophils Relative %: 61 %
Platelets: 290 10*3/uL (ref 150–400)
RBC: 3.65 MIL/uL — ABNORMAL LOW (ref 3.87–5.11)
RDW: 12.6 % (ref 11.5–15.5)
WBC: 6 10*3/uL (ref 4.0–10.5)
nRBC: 0 % (ref 0.0–0.2)

## 2019-11-23 LAB — COMPREHENSIVE METABOLIC PANEL
ALT: 16 U/L (ref 0–44)
AST: 17 U/L (ref 15–41)
Albumin: 3.5 g/dL (ref 3.5–5.0)
Alkaline Phosphatase: 79 U/L (ref 38–126)
Anion gap: 8 (ref 5–15)
BUN: 20 mg/dL (ref 8–23)
CO2: 23 mmol/L (ref 22–32)
Calcium: 8.8 mg/dL — ABNORMAL LOW (ref 8.9–10.3)
Chloride: 107 mmol/L (ref 98–111)
Creatinine, Ser: 1.05 mg/dL — ABNORMAL HIGH (ref 0.44–1.00)
GFR, Estimated: 52 mL/min — ABNORMAL LOW (ref >60)
Glucose, Bld: 155 mg/dL — ABNORMAL HIGH (ref 70–99)
Potassium: 4.2 mmol/L (ref 3.5–5.1)
Sodium: 138 mmol/L (ref 135–145)
Total Bilirubin: 0.5 mg/dL (ref 0.3–1.2)
Total Protein: 6.2 g/dL — ABNORMAL LOW (ref 6.5–8.1)

## 2019-11-23 MED ORDER — HEPARIN SOD (PORK) LOCK FLUSH 100 UNIT/ML IV SOLN
500.0000 [IU] | Freq: Once | INTRAVENOUS | Status: AC
  Administered 2019-11-23: 500 [IU]
  Filled 2019-11-23: qty 5

## 2019-11-23 MED ORDER — SODIUM CHLORIDE 0.9% FLUSH
10.0000 mL | Freq: Once | INTRAVENOUS | Status: AC
  Administered 2019-11-23: 10 mL
  Filled 2019-11-23: qty 10

## 2019-11-23 NOTE — Assessment & Plan Note (Signed)
She has completed maintenance obinutuzumab except for the last treatment that was omitted due to pandemic concerns Her insurance will not pay for screening/surveillance imaging study Currently, she is not symptomatic and clinically, has no signs or symptoms to suggest cancer recurrence I recommend observation only with blood work, history and physical examination and she agreed I will see her back in 8 weeks for further follow-up

## 2019-11-23 NOTE — Assessment & Plan Note (Signed)
The most likely cause of her anemia is anemia chronic kidney disease I will check iron and vitamin B12 study in her next visit to exclude mineral deficiencies

## 2019-11-23 NOTE — Assessment & Plan Note (Signed)
Her blood pressure is elevated but according to the patient, her blood pressure control at home is satisfactory Observe closely for now

## 2019-11-23 NOTE — Progress Notes (Signed)
[facility] OFFICE PROGRESS NOTE  Patient Care Team: [name], Bill Salinas, MD as PCP - General (Family Medicine) Sharyne Peach, MD as Consulting Physician (Ophthalmology) Inda Castle, MD (Inactive) as Consulting Physician (Gastroenterology) Sypher, Herbie Baltimore, MD (Inactive) as Consulting Physician (Orthopedic Surgery) Jannette Spanner, MD as Referring Physician (Dermatology) Heath Lark, MD as Consulting Physician (Hematology and Oncology) Alphonsa Overall, MD as Consulting Physician (General Surgery)  ASSESSMENT & PLAN:  Marginal zone lymphoma She has completed maintenance obinutuzumab except for the last treatment that was omitted due to pandemic concerns Her insurance will not pay for screening/surveillance imaging study Currently, she is not symptomatic and clinically, has no signs or symptoms to suggest cancer recurrence I recommend observation only with blood work, history and physical examination and she agreed I will see her back in 8 weeks for further follow-up   Deficiency anemia The most likely cause of her anemia is anemia chronic kidney disease I will check iron and vitamin B12 study in her next visit to exclude mineral deficiencies  Chronic kidney disease (CKD), stage III (moderate) Her kidney function tests is stable. Continue close monitoring and risk factor modification  Essential hypertension Her blood pressure is elevated but according to the patient, her blood pressure control at home is satisfactory Observe closely for now   Orders Placed This Encounter  Procedures  . Iron and TIBC    Standing Status:   Future    Standing Expiration Date:   11/22/2020  . Vitamin B12    Standing Status:   Future    Standing Expiration Date:   11/22/2020  . Ferritin    Standing Status:   Future    Standing Expiration Date:   11/22/2020    All questions were answered. The patient knows to call the clinic with any problems, questions or concerns. The total  time spent in the appointment was 20 minutes encounter with patients including review of chart and various tests results, discussions about plan of care and coordination of care plan   Heath Lark, MD 11/23/2019 11:10 AM  INTERVAL HISTORY: Please see below for problem oriented charting. She returns for further follow-up No recent infection, fever or chills No new lymphadenopathy She has lost some weight due to concerns over her son who was diagnosed with endocarditis  SUMMARY OF ONCOLOGIC HISTORY: Oncology History Overview Note  Marginal zone lymphoma, with lymph node and bone marrow involvement along with splenomegaly   Primary site: Lymphoid Neoplasms   Staging method: AJCC 6th Edition   Clinical: Stage IV signed by Heath Lark, MD on 09/02/2013  7:38 PM   Summary: Stage IV     Marginal zone lymphoma ([place])  07/29/2013 Imaging   CT scan shows splenomegaly and abnormal lymphadenopathy.   08/13/2013 Imaging   PET CT scan confirmed lymphadenopathy and splenomegaly, those areas are PET avid   08/23/2013 Surgery   [id number] left axillary lymph node biopsy confirmed a low-grade lymphoma.   08/27/2013 Bone Marrow Biopsy   Bone marrow biopsy confirmed lymphoma.[id number]   09/08/2013 - 09/29/2013 Chemotherapy   She completed 4 cycles of rituximab with resolution of splenomegaly.   12/29/2013 Imaging   PET scan show complete response.   07/11/2014 Imaging   PET scan showed recurrent disease   07/19/2014 - 08/09/2014 Chemotherapy   She completed 4 cycles of rituximab with resolution of splenomegaly   10/12/2014 Imaging   Repeat PET/CT scan showed near complete response to treatment.   10/13/2014 - 02/09/2015 Chemotherapy   She is  placed on rituximab maintenance therapy.   03/29/2015 Imaging   PET CT showed possible mild progression of splenomegaly   04/26/2015 - 02/22/2016 Chemotherapy   She started taking Ibrutinib   04/28/2015 Adverse Reaction   Treatment was placed temporarily on hold  due to severe diarrhea and resumed at reduced dose   07/03/2015 PET scan   No hypermetabolic adenopathy in the neck, chest, abdomen or pelvis.2. Spleen is not hypermetabolic and appears slightly less prominent size.   02/06/2016 PET scan   Hypermetabolic LEFT axillary lymph nodes. Concern for HIGH-GRADE LYMPHOMA LOCALIZED RECURRENCE. 2. Prominent spleen without hypermetabolic activity. No change in size from comparison. 3. No additional hypermetabolic adenopathy on scan.   02/19/2016 Procedure   Technically successful ultrasound-guided core biopsy, left axillary adenopathy.   02/19/2016 Pathology Results   Lymph node, needle/core biopsy, left axilla - ATYPICAL LYMPHOID PROLIFERATION SUSPICIOUS FOR LYMPHOMA. - SEE COMMENT. Microscopic Comment The sections show multiple very small and skinny fragments of lymph nodal tissue displaying areas of lymphoid expansion by a population of small to medium size lymphocytes with round to irregular nuclei, scanty to moderately abundant cytoplasm, dense chromatin and inconspicuous nucleoli. This is associated with scattering of "naked" germinal centers with abundant tingible body macrophages. To further evaluate this process, flow cytometric analysis was performed and shows a minor population of monoclonal B cells expressing pan B-cell antigens including CD20 with associated kappa light chain restriction. In addition, immunohistochemical stains were performed including CD20, CD79a, CD3, CD5, CD10, BCL-2, CD21 with appropriate controls. The stains show a mixture of T and B cells with slight predominance of B cells. CD21 highlights numerous areas with dendritic networks, correlating with the presence of germinal centers. CD10 shows focal positivity likely in germinal centers. BCL-2 is diffusely positive except in areas of apparent germinal centers. The overall features are atypical and suspicious for involvement by a B-cell lymphoproliferative process, particularly given  the B-cell monoclonality by flow cytometry. However, morphologic evaluation is extremely limited due to small biopsy fragments and additional material (excisional biopsy) is strongly recommended for further evaluation. (BNS:ecj 02/21/2016)    03/04/2016 Procedure   Placement of single lumen port a cath via right internal jugular vein. The catheter tip lies at the cavo-atrial junction. A power injectable port a cath was placed and is ready for immediate use.   03/07/2016 - 05/31/2016 Chemotherapy   She received treatment with bendamustine and Gazyva   05/27/2016 PET scan   1. No evidence of active lymphoma on whole-body scan. 2. Resolution of metabolic activity previously identified in the LEFT axilla. 3. Small amount free fluid the pelvis.   03/07/2017 PET scan   1. No findings of active lymphoma in the chest, abdomen, or pelvis. 2. Other imaging findings of potential clinical significance: Aortic Atherosclerosis (ICD10-I70.0). Coronary atherosclerosis. Chronic paranasal sinusitis. Moderately distended gallbladder, chronic     REVIEW OF SYSTEMS:   Constitutional: Denies fevers, chills or abnormal weight loss Eyes: Denies blurriness of vision Ears, nose, mouth, throat, and face: Denies mucositis or sore throat Respiratory: Denies cough, dyspnea or wheezes Cardiovascular: Denies palpitation, chest discomfort or lower extremity swelling Gastrointestinal:  Denies nausea, heartburn or change in bowel habits Skin: Denies abnormal skin rashes Lymphatics: Denies new lymphadenopathy or easy bruising Neurological:Denies numbness, tingling or new weaknesses Behavioral/Psych: Mood is stable, no new changes  All other systems were reviewed with the patient and are negative.  I have reviewed the past medical history, past surgical history, social history and family history with the patient  and they are unchanged from previous note.  ALLERGIES:  is allergic to aspirin, capoten [captopril], levemir [insulin  detemir], micronase [glyburide], onglyza [saxagliptin], statins, and zocor [simvastatin].  MEDICATIONS:  Current Outpatient Medications  Medication Sig Dispense Refill  . Accu-Chek FastClix Lancets MISC USE 1 TO CHECK GLUCOSE ONCE DAILY    . acyclovir (ZOVIRAX) 400 MG tablet Take 1 tablet by mouth once daily 30 tablet 11  . amLODipine (NORVASC) 10 MG tablet TAKE 1 TABLET BY MOUTH AT BEDTIME 90 tablet 0  . aspirin 81 MG tablet Take 40.5 mg by mouth 3 (three) times a week.    . Blood Glucose Monitoring Suppl (ACCU-CHEK GUIDE) w/Device KIT See admin instructions.    . Calcium Carbonate-Vit D-Min (CALCIUM 1200 PO) Take 1 tablet by mouth daily. Per patient takes in am and pm    . cholecalciferol (VITAMIN D) 1000 UNITS tablet Take 1,000 Units by mouth daily.     . enalapril (VASOTEC) 20 MG tablet Take 20 mg by mouth every morning.    . fish oil-omega-3 fatty acids 1000 MG capsule Take 1 g by mouth daily.     Marland Kitchen glimepiride (AMARYL) 4 MG tablet Take 4 mg by mouth daily with breakfast. Per patient takes in am and pm.    . glucose blood test strip Use as instructed 100 each 12  . lidocaine-prilocaine (EMLA) cream Apply 1 application topically as needed. 30 g 3  . loperamide (IMODIUM) 2 MG capsule Take by mouth as needed for diarrhea or loose stools.    . Magnesium 500 MG CAPS Take 500 mg by mouth daily.     . metFORMIN (GLUCOPHAGE) 1000 MG tablet Take 1,000 mg by mouth 2 (two) times daily with a meal.     . Misc Natural Products (LUTEIN 20 PO) Take by mouth daily.    . Multiple Vitamins-Minerals (CENTRUM CARDIO) TABS Take 1 tablet by mouth daily.    . Multiple Vitamins-Minerals (ICAPS) CAPS Take by mouth daily.     No current facility-administered medications for this visit.    PHYSICAL EXAMINATION: ECOG PERFORMANCE STATUS: 1 - Symptomatic but completely ambulatory  Vitals:   11/23/19 1019  BP: (!) 174/66  Pulse: 76  Resp: 18  Temp: 97.7 F (36.5 C)  SpO2: 100%   Filed Weights     GENERAL:alert, no distress and comfortable SKIN: skin color, texture, turgor are normal, no rashes or significant lesions EYES: normal, Conjunctiva are pink and non-injected, sclera clear OROPHARYNX:no exudate, no erythema and lips, buccal mucosa, and tongue normal  NECK: supple, thyroid normal size, non-tender, without nodularity LYMPH:  no palpable lymphadenopathy in the cervical, axillary or inguinal LUNGS: clear to auscultation and percussion with normal breathing effort HEART: regular rate & rhythm and no murmurs and no lower extremity edema ABDOMEN:abdomen soft, non-tender and normal bowel sounds Musculoskeletal:no cyanosis of digits and no clubbing  NEURO: alert & oriented x 3 with fluent speech, no focal motor/sensory deficits  LABORATORY DATA:  I have reviewed the data as listed    Component Value Date/Time   NA 138 11/23/2019 0941   NA 136 01/07/2017 1004   K 4.2 11/23/2019 0941   K 4.5 01/07/2017 1004   CL 107 11/23/2019 0941   CO2 23 11/23/2019 0941   CO2 23 01/07/2017 1004   GLUCOSE 155 (H) 11/23/2019 0941   GLUCOSE 217 (H) 01/07/2017 1004   BUN 20 11/23/2019 0941   BUN 23.3 01/07/2017 1004   CREATININE 1.05 (H) 11/23/2019 9381  CREATININE 1.37 (H) 07/06/2018 0959   CREATININE 1.2 (H) 01/07/2017 1004   CALCIUM 8.8 (L) 11/23/2019 0941   CALCIUM 9.4 01/07/2017 1004   PROT 6.2 (L) 11/23/2019 0941   PROT 6.7 01/07/2017 1004   ALBUMIN 3.5 11/23/2019 0941   ALBUMIN 3.9 01/07/2017 1004   AST 17 11/23/2019 0941   AST 15 07/06/2018 0959   AST 20 01/07/2017 1004   ALT 16 11/23/2019 0941   ALT 10 07/06/2018 0959   ALT 14 01/07/2017 1004   ALKPHOS 79 11/23/2019 0941   ALKPHOS 70 01/07/2017 1004   BILITOT 0.5 11/23/2019 0941   BILITOT 0.4 07/06/2018 0959   BILITOT 0.49 01/07/2017 1004   GFRNONAA 52 (L) 11/23/2019 0941   GFRNONAA 36 (L) 07/06/2018 0959   GFRNONAA 47 (L) 05/18/2013 1256   GFRAA 40 (L) 09/28/2019 1053   GFRAA 41 (L) 07/06/2018 0959   GFRAA 55  (L) 05/18/2013 1256    No results found for: SPEP, UPEP  Lab Results  Component Value Date   WBC 6.0 11/23/2019   NEUTROABS 3.6 11/23/2019   HGB 10.7 (L) 11/23/2019   HCT 32.2 (L) 11/23/2019   MCV 88.2 11/23/2019   PLT 290 11/23/2019      Chemistry      Component Value Date/Time   NA 138 11/23/2019 0941   NA 136 01/07/2017 1004   K 4.2 11/23/2019 0941   K 4.5 01/07/2017 1004   CL 107 11/23/2019 0941   CO2 23 11/23/2019 0941   CO2 23 01/07/2017 1004   BUN 20 11/23/2019 0941   BUN 23.3 01/07/2017 1004   CREATININE 1.05 (H) 11/23/2019 0941   CREATININE 1.37 (H) 07/06/2018 0959   CREATININE 1.2 (H) 01/07/2017 1004   GLU 202 (H) 08/09/2014 1000      Component Value Date/Time   CALCIUM 8.8 (L) 11/23/2019 0941   CALCIUM 9.4 01/07/2017 1004   ALKPHOS 79 11/23/2019 0941   ALKPHOS 70 01/07/2017 1004   AST 17 11/23/2019 0941   AST 15 07/06/2018 0959   AST 20 01/07/2017 1004   ALT 16 11/23/2019 0941   ALT 10 07/06/2018 0959   ALT 14 01/07/2017 1004   BILITOT 0.5 11/23/2019 0941   BILITOT 0.4 07/06/2018 0959   BILITOT 0.49 01/07/2017 1004

## 2019-11-23 NOTE — Assessment & Plan Note (Signed)
Her kidney function tests is stable. Continue close monitoring and risk factor modification 

## 2020-01-18 ENCOUNTER — Inpatient Hospital Stay: Payer: Medicare Other

## 2020-01-18 ENCOUNTER — Inpatient Hospital Stay: Payer: Medicare Other | Attending: Hematology and Oncology | Admitting: Hematology and Oncology

## 2020-01-18 ENCOUNTER — Other Ambulatory Visit: Payer: Self-pay

## 2020-01-18 ENCOUNTER — Encounter: Payer: Self-pay | Admitting: Hematology and Oncology

## 2020-01-18 DIAGNOSIS — Z9221 Personal history of antineoplastic chemotherapy: Secondary | ICD-10-CM | POA: Diagnosis not present

## 2020-01-18 DIAGNOSIS — D630 Anemia in neoplastic disease: Secondary | ICD-10-CM | POA: Diagnosis not present

## 2020-01-18 DIAGNOSIS — Z79899 Other long term (current) drug therapy: Secondary | ICD-10-CM | POA: Insufficient documentation

## 2020-01-18 DIAGNOSIS — Z8572 Personal history of non-Hodgkin lymphomas: Secondary | ICD-10-CM | POA: Insufficient documentation

## 2020-01-18 DIAGNOSIS — E1165 Type 2 diabetes mellitus with hyperglycemia: Secondary | ICD-10-CM | POA: Diagnosis not present

## 2020-01-18 DIAGNOSIS — D539 Nutritional anemia, unspecified: Secondary | ICD-10-CM

## 2020-01-18 DIAGNOSIS — E1121 Type 2 diabetes mellitus with diabetic nephropathy: Secondary | ICD-10-CM | POA: Diagnosis not present

## 2020-01-18 DIAGNOSIS — D61818 Other pancytopenia: Secondary | ICD-10-CM

## 2020-01-18 DIAGNOSIS — Z95828 Presence of other vascular implants and grafts: Secondary | ICD-10-CM

## 2020-01-18 DIAGNOSIS — N183 Chronic kidney disease, stage 3 unspecified: Secondary | ICD-10-CM | POA: Diagnosis not present

## 2020-01-18 DIAGNOSIS — C8580 Other specified types of non-Hodgkin lymphoma, unspecified site: Secondary | ICD-10-CM

## 2020-01-18 LAB — FERRITIN: Ferritin: 57 ng/mL (ref 11–307)

## 2020-01-18 LAB — COMPREHENSIVE METABOLIC PANEL
ALT: 17 U/L (ref 0–44)
AST: 19 U/L (ref 15–41)
Albumin: 3.7 g/dL (ref 3.5–5.0)
Alkaline Phosphatase: 89 U/L (ref 38–126)
Anion gap: 10 (ref 5–15)
BUN: 26 mg/dL — ABNORMAL HIGH (ref 8–23)
CO2: 23 mmol/L (ref 22–32)
Calcium: 9 mg/dL (ref 8.9–10.3)
Chloride: 105 mmol/L (ref 98–111)
Creatinine, Ser: 1.32 mg/dL — ABNORMAL HIGH (ref 0.44–1.00)
GFR, Estimated: 40 mL/min — ABNORMAL LOW (ref >60)
Glucose, Bld: 254 mg/dL — ABNORMAL HIGH (ref 70–99)
Potassium: 4.3 mmol/L (ref 3.5–5.1)
Sodium: 138 mmol/L (ref 135–145)
Total Bilirubin: 0.7 mg/dL (ref 0.3–1.2)
Total Protein: 6.6 g/dL (ref 6.5–8.1)

## 2020-01-18 LAB — CBC WITH DIFFERENTIAL/PLATELET
Abs Immature Granulocytes: 0.28 10*3/uL — ABNORMAL HIGH (ref 0.00–0.07)
Basophils Absolute: 0.1 10*3/uL (ref 0.0–0.1)
Basophils Relative: 1 %
Eosinophils Absolute: 0.1 10*3/uL (ref 0.0–0.5)
Eosinophils Relative: 2 %
HCT: 32.9 % — ABNORMAL LOW (ref 36.0–46.0)
Hemoglobin: 11.1 g/dL — ABNORMAL LOW (ref 12.0–15.0)
Immature Granulocytes: 5 %
Lymphocytes Relative: 17 %
Lymphs Abs: 1 10*3/uL (ref 0.7–4.0)
MCH: 30.1 pg (ref 26.0–34.0)
MCHC: 33.7 g/dL (ref 30.0–36.0)
MCV: 89.2 fL (ref 80.0–100.0)
Monocytes Absolute: 0.5 10*3/uL (ref 0.1–1.0)
Monocytes Relative: 8 %
Neutro Abs: 4.2 10*3/uL (ref 1.7–7.7)
Neutrophils Relative %: 67 %
Platelets: 282 10*3/uL (ref 150–400)
RBC: 3.69 MIL/uL — ABNORMAL LOW (ref 3.87–5.11)
RDW: 13.2 % (ref 11.5–15.5)
WBC: 6.2 10*3/uL (ref 4.0–10.5)
nRBC: 0 % (ref 0.0–0.2)

## 2020-01-18 LAB — IRON AND TIBC
Iron: 68 ug/dL (ref 41–142)
Saturation Ratios: 18 % — ABNORMAL LOW (ref 21–57)
TIBC: 370 ug/dL (ref 236–444)
UIBC: 302 ug/dL (ref 120–384)

## 2020-01-18 LAB — VITAMIN B12: Vitamin B-12: 2399 pg/mL — ABNORMAL HIGH (ref 180–914)

## 2020-01-18 MED ORDER — SODIUM CHLORIDE 0.9% FLUSH
10.0000 mL | Freq: Once | INTRAVENOUS | Status: AC
  Administered 2020-01-18: 09:00:00 10 mL
  Filled 2020-01-18: qty 10

## 2020-01-18 MED ORDER — HEPARIN SOD (PORK) LOCK FLUSH 100 UNIT/ML IV SOLN
500.0000 [IU] | Freq: Once | INTRAVENOUS | Status: AC
Start: 2020-01-18 — End: 2020-01-18
  Administered 2020-01-18: 09:00:00 500 [IU]
  Filled 2020-01-18: qty 5

## 2020-01-18 NOTE — Assessment & Plan Note (Addendum)
The most likely cause of her anemia is anemia chronic kidney disease. I will check iron studies and vitamin B12 and will call her with test results

## 2020-01-18 NOTE — Assessment & Plan Note (Signed)
She has completed maintenance obinutuzumab except for the last treatment that was omitted due to pandemic concerns Her insurance will not pay for screening/surveillance imaging study Currently, she is not symptomatic and clinically, has no signs or symptoms to suggest cancer recurrence I recommend observation only with blood work, history and physical examination and she agreed I will see her back in 8 weeks for further follow-up We discussed the risk and benefits of port removal; for now she would like to keep

## 2020-01-18 NOTE — Assessment & Plan Note (Signed)
Her kidney function tests is stable. Continue close monitoring and risk factor modification 

## 2020-01-18 NOTE — Assessment & Plan Note (Signed)
She has poorly controlled diabetes Her blood sugar control is worse recently due to the death of her son We discussed the importance of dietary modification

## 2020-01-18 NOTE — Progress Notes (Signed)
[facility] OFFICE PROGRESS NOTE  Patient Care Team: [name], Bill Salinas, MD as PCP - General (Family Medicine) Sharyne Peach, MD as Consulting Physician (Ophthalmology) Inda Castle, MD (Inactive) as Consulting Physician (Gastroenterology) Sypher, Herbie Baltimore, MD (Inactive) as Consulting Physician (Orthopedic Surgery) Jannette Spanner, MD as Referring Physician (Dermatology) Heath Lark, MD as Consulting Physician (Hematology and Oncology) Alphonsa Overall, MD as Consulting Physician (General Surgery)  ASSESSMENT & PLAN:  Marginal zone lymphoma She has completed maintenance obinutuzumab except for the last treatment that was omitted due to pandemic concerns Her insurance will not pay for screening/surveillance imaging study Currently, she is not symptomatic and clinically, has no signs or symptoms to suggest cancer recurrence I recommend observation only with blood work, history and physical examination and she agreed I will see her back in 8 weeks for further follow-up We discussed the risk and benefits of port removal; for now she would like to keep  Anemia in neoplastic disease The most likely cause of her anemia is anemia chronic kidney disease. I will check iron studies and vitamin B12 and will call her with test results   Chronic kidney disease (CKD), stage III (moderate) Her kidney function tests is stable. Continue close monitoring and risk factor modification  Type 2 diabetes mellitus with diabetic nephropathy She has poorly controlled diabetes Her blood sugar control is worse recently due to the death of her son We discussed the importance of dietary modification   No orders of the defined types were placed in this encounter.   All questions were answered. The patient knows to call the clinic with any problems, questions or concerns. The total time spent in the appointment was 20 minutes encounter with patients including review of chart and various tests  results, discussions about plan of care and coordination of care plan   Heath Lark, MD 01/18/2020 9:32 AM  INTERVAL HISTORY: Please see below for problem oriented charting. She returns for further follow-up She has no new lymphadenopathy No recent infection, fever or chills She has lost some weight due to grieving One of her sons recently passed away  SUMMARY OF ONCOLOGIC HISTORY: Oncology History Overview Note  Marginal zone lymphoma, with lymph node and bone marrow involvement along with splenomegaly   Primary site: Lymphoid Neoplasms   Staging method: AJCC 6th Edition   Clinical: Stage IV signed by Heath Lark, MD on 09/02/2013  7:38 PM   Summary: Stage IV     Marginal zone lymphoma ([place])  07/29/2013 Imaging   CT scan shows splenomegaly and abnormal lymphadenopathy.   08/13/2013 Imaging   PET CT scan confirmed lymphadenopathy and splenomegaly, those areas are PET avid   08/23/2013 Surgery   [id number] left axillary lymph node biopsy confirmed a low-grade lymphoma.   08/27/2013 Bone Marrow Biopsy   Bone marrow biopsy confirmed lymphoma.[id number]   09/08/2013 - 09/29/2013 Chemotherapy   She completed 4 cycles of rituximab with resolution of splenomegaly.   12/29/2013 Imaging   PET scan show complete response.   07/11/2014 Imaging   PET scan showed recurrent disease   07/19/2014 - 08/09/2014 Chemotherapy   She completed 4 cycles of rituximab with resolution of splenomegaly   10/12/2014 Imaging   Repeat PET/CT scan showed near complete response to treatment.   10/13/2014 - 02/09/2015 Chemotherapy   She is placed on rituximab maintenance therapy.   03/29/2015 Imaging   PET CT showed possible mild progression of splenomegaly   04/26/2015 - 02/22/2016 Chemotherapy   She started taking Ibrutinib  04/28/2015 Adverse Reaction   Treatment was placed temporarily on hold due to severe diarrhea and resumed at reduced dose   07/03/2015 PET scan   No hypermetabolic adenopathy in the neck,  chest, abdomen or pelvis.2. Spleen is not hypermetabolic and appears slightly less prominent size.   02/06/2016 PET scan   Hypermetabolic LEFT axillary lymph nodes. Concern for HIGH-GRADE LYMPHOMA LOCALIZED RECURRENCE. 2. Prominent spleen without hypermetabolic activity. No change in size from comparison. 3. No additional hypermetabolic adenopathy on scan.   02/19/2016 Procedure   Technically successful ultrasound-guided core biopsy, left axillary adenopathy.   02/19/2016 Pathology Results   Lymph node, needle/core biopsy, left axilla - ATYPICAL LYMPHOID PROLIFERATION SUSPICIOUS FOR LYMPHOMA. - SEE COMMENT. Microscopic Comment The sections show multiple very small and skinny fragments of lymph nodal tissue displaying areas of lymphoid expansion by a population of small to medium size lymphocytes with round to irregular nuclei, scanty to moderately abundant cytoplasm, dense chromatin and inconspicuous nucleoli. This is associated with scattering of "naked" germinal centers with abundant tingible body macrophages. To further evaluate this process, flow cytometric analysis was performed and shows a minor population of monoclonal B cells expressing pan B-cell antigens including CD20 with associated kappa light chain restriction. In addition, immunohistochemical stains were performed including CD20, CD79a, CD3, CD5, CD10, BCL-2, CD21 with appropriate controls. The stains show a mixture of T and B cells with slight predominance of B cells. CD21 highlights numerous areas with dendritic networks, correlating with the presence of germinal centers. CD10 shows focal positivity likely in germinal centers. BCL-2 is diffusely positive except in areas of apparent germinal centers. The overall features are atypical and suspicious for involvement by a B-cell lymphoproliferative process, particularly given the B-cell monoclonality by flow cytometry. However, morphologic evaluation is extremely limited due to small biopsy  fragments and additional material (excisional biopsy) is strongly recommended for further evaluation. (BNS:ecj 02/21/2016)    03/04/2016 Procedure   Placement of single lumen port a cath via right internal jugular vein. The catheter tip lies at the cavo-atrial junction. A power injectable port a cath was placed and is ready for immediate use.   03/07/2016 - 05/31/2016 Chemotherapy   She received treatment with bendamustine and Gazyva   05/27/2016 PET scan   1. No evidence of active lymphoma on whole-body scan. 2. Resolution of metabolic activity previously identified in the LEFT axilla. 3. Small amount free fluid the pelvis.   03/07/2017 PET scan   1. No findings of active lymphoma in the chest, abdomen, or pelvis. 2. Other imaging findings of potential clinical significance: Aortic Atherosclerosis (ICD10-I70.0). Coronary atherosclerosis. Chronic paranasal sinusitis. Moderately distended gallbladder, chronic     REVIEW OF SYSTEMS:   Constitutional: Denies fevers, chills or abnormal weight loss Eyes: Denies blurriness of vision Ears, nose, mouth, throat, and face: Denies mucositis or sore throat Respiratory: Denies cough, dyspnea or wheezes Cardiovascular: Denies palpitation, chest discomfort or lower extremity swelling Gastrointestinal:  Denies nausea, heartburn or change in bowel habits Skin: Denies abnormal skin rashes Lymphatics: Denies new lymphadenopathy or easy bruising Neurological:Denies numbness, tingling or new weaknesses Behavioral/Psych: Mood is stable, no new changes  All other systems were reviewed with the patient and are negative.  I have reviewed the past medical history, past surgical history, social history and family history with the patient and they are unchanged from previous note.  ALLERGIES:  is allergic to aspirin, capoten [captopril], levemir [insulin detemir], micronase [glyburide], onglyza [saxagliptin], statins, and zocor [simvastatin].  MEDICATIONS:  Current  Outpatient  Medications  Medication Sig Dispense Refill  . Accu-Chek FastClix Lancets MISC USE 1 TO CHECK GLUCOSE ONCE DAILY    . acyclovir (ZOVIRAX) 400 MG tablet Take 1 tablet by mouth once daily 30 tablet 11  . amLODipine (NORVASC) 10 MG tablet TAKE 1 TABLET BY MOUTH AT BEDTIME 90 tablet 0  . aspirin 81 MG tablet Take 40.5 mg by mouth 3 (three) times a week.    . Blood Glucose Monitoring Suppl (ACCU-CHEK GUIDE) w/Device KIT See admin instructions.    . Calcium Carbonate-Vit D-Min (CALCIUM 1200 PO) Take 1 tablet by mouth daily. Per patient takes in am and pm    . cholecalciferol (VITAMIN D) 1000 UNITS tablet Take 1,000 Units by mouth daily.     . enalapril (VASOTEC) 20 MG tablet Take 20 mg by mouth every morning.    . fish oil-omega-3 fatty acids 1000 MG capsule Take 1 g by mouth daily.     Marland Kitchen glimepiride (AMARYL) 4 MG tablet Take 4 mg by mouth daily with breakfast. Per patient takes in am and pm.    . glucose blood test strip Use as instructed 100 each 12  . lidocaine-prilocaine (EMLA) cream Apply 1 application topically as needed. 30 g 3  . loperamide (IMODIUM) 2 MG capsule Take by mouth as needed for diarrhea or loose stools.    . Magnesium 500 MG CAPS Take 500 mg by mouth daily.     . metFORMIN (GLUCOPHAGE) 1000 MG tablet Take 1,000 mg by mouth 2 (two) times daily with a meal.     . Misc Natural Products (LUTEIN 20 PO) Take by mouth daily.    . Multiple Vitamins-Minerals (CENTRUM CARDIO) TABS Take 1 tablet by mouth daily.    . Multiple Vitamins-Minerals (ICAPS) CAPS Take by mouth daily.     No current facility-administered medications for this visit.    PHYSICAL EXAMINATION: ECOG PERFORMANCE STATUS: 1 - Symptomatic but completely ambulatory  Vitals:   01/18/20 0843  BP: (!) 179/78  Pulse: 96  Resp: 17  Temp: (!) 97 F (36.1 C)  SpO2: 99%   Filed Weights   01/18/20 0843  Weight: 162 lb (73.5 kg)    GENERAL:alert, no distress and comfortable SKIN: skin color, texture,  turgor are normal, no rashes or significant lesions EYES: normal, Conjunctiva are pink and non-injected, sclera clear OROPHARYNX:no exudate, no erythema and lips, buccal mucosa, and tongue normal  NECK: supple, thyroid normal size, non-tender, without nodularity LYMPH:  no palpable lymphadenopathy in the cervical, axillary or inguinal LUNGS: clear to auscultation and percussion with normal breathing effort HEART: regular rate & rhythm and no murmurs and no lower extremity edema ABDOMEN:abdomen soft, non-tender and normal bowel sounds Musculoskeletal:no cyanosis of digits and no clubbing  NEURO: alert & oriented x 3 with fluent speech, no focal motor/sensory deficits  LABORATORY DATA:  I have reviewed the data as listed    Component Value Date/Time   NA 138 01/18/2020 0800   NA 136 01/07/2017 1004   K 4.3 01/18/2020 0800   K 4.5 01/07/2017 1004   CL 105 01/18/2020 0800   CO2 23 01/18/2020 0800   CO2 23 01/07/2017 1004   GLUCOSE 254 (H) 01/18/2020 0800   GLUCOSE 217 (H) 01/07/2017 1004   BUN 26 (H) 01/18/2020 0800   BUN 23.3 01/07/2017 1004   CREATININE 1.32 (H) 01/18/2020 0800   CREATININE 1.37 (H) 07/06/2018 0959   CREATININE 1.2 (H) 01/07/2017 1004   CALCIUM 9.0 01/18/2020 0800   CALCIUM  9.4 01/07/2017 1004   PROT 6.6 01/18/2020 0800   PROT 6.7 01/07/2017 1004   ALBUMIN 3.7 01/18/2020 0800   ALBUMIN 3.9 01/07/2017 1004   AST 19 01/18/2020 0800   AST 15 07/06/2018 0959   AST 20 01/07/2017 1004   ALT 17 01/18/2020 0800   ALT 10 07/06/2018 0959   ALT 14 01/07/2017 1004   ALKPHOS 89 01/18/2020 0800   ALKPHOS 70 01/07/2017 1004   BILITOT 0.7 01/18/2020 0800   BILITOT 0.4 07/06/2018 0959   BILITOT 0.49 01/07/2017 1004   GFRNONAA 40 (L) 01/18/2020 0800   GFRNONAA 36 (L) 07/06/2018 0959   GFRNONAA 47 (L) 05/18/2013 1256   GFRAA 40 (L) 09/28/2019 1053   GFRAA 41 (L) 07/06/2018 0959   GFRAA 55 (L) 05/18/2013 1256    No results found for: SPEP, UPEP  Lab Results   Component Value Date   WBC 6.2 01/18/2020   NEUTROABS 4.2 01/18/2020   HGB 11.1 (L) 01/18/2020   HCT 32.9 (L) 01/18/2020   MCV 89.2 01/18/2020   PLT 282 01/18/2020      Chemistry      Component Value Date/Time   NA 138 01/18/2020 0800   NA 136 01/07/2017 1004   K 4.3 01/18/2020 0800   K 4.5 01/07/2017 1004   CL 105 01/18/2020 0800   CO2 23 01/18/2020 0800   CO2 23 01/07/2017 1004   BUN 26 (H) 01/18/2020 0800   BUN 23.3 01/07/2017 1004   CREATININE 1.32 (H) 01/18/2020 0800   CREATININE 1.37 (H) 07/06/2018 0959   CREATININE 1.2 (H) 01/07/2017 1004   GLU 202 (H) 08/09/2014 1000      Component Value Date/Time   CALCIUM 9.0 01/18/2020 0800   CALCIUM 9.4 01/07/2017 1004   ALKPHOS 89 01/18/2020 0800   ALKPHOS 70 01/07/2017 1004   AST 19 01/18/2020 0800   AST 15 07/06/2018 0959   AST 20 01/07/2017 1004   ALT 17 01/18/2020 0800   ALT 10 07/06/2018 0959   ALT 14 01/07/2017 1004   BILITOT 0.7 01/18/2020 0800   BILITOT 0.4 07/06/2018 0959   BILITOT 0.49 01/07/2017 1004

## 2020-01-19 ENCOUNTER — Telehealth: Payer: Self-pay | Admitting: Hematology and Oncology

## 2020-01-19 NOTE — Telephone Encounter (Signed)
Called pt per 12/21 sch msg - no answer. Left message for patient with appt date and time

## 2020-01-25 ENCOUNTER — Ambulatory Visit: Payer: Medicare Other | Admitting: Podiatry

## 2020-01-25 ENCOUNTER — Other Ambulatory Visit: Payer: Self-pay

## 2020-01-25 ENCOUNTER — Encounter: Payer: Self-pay | Admitting: Podiatry

## 2020-01-25 DIAGNOSIS — B351 Tinea unguium: Secondary | ICD-10-CM

## 2020-01-25 DIAGNOSIS — N183 Chronic kidney disease, stage 3 unspecified: Secondary | ICD-10-CM

## 2020-01-25 DIAGNOSIS — E1121 Type 2 diabetes mellitus with diabetic nephropathy: Secondary | ICD-10-CM | POA: Diagnosis not present

## 2020-01-25 DIAGNOSIS — M79675 Pain in left toe(s): Secondary | ICD-10-CM

## 2020-01-25 DIAGNOSIS — M79674 Pain in right toe(s): Secondary | ICD-10-CM

## 2020-01-25 NOTE — Progress Notes (Signed)
This patient returns to my office for at risk foot care.  This patient requires this care by a professional since this patient will be at risk due to having chronic kidney disease and type 2 diabetes.  This patient is unable to cut nails herself since the patient cannot reach her nails.These nails are painful walking and wearing shoes.  This patient presents for at risk foot care today.  General Appearance  Alert, conversant and in no acute stress.  Vascular  Dorsalis pedis and posterior tibial  pulses are palpable  bilaterally.  Capillary return is within normal limits  bilaterally. Temperature is within normal limits  bilaterally.  Neurologic  Senn-Weinstein monofilament wire test within normal limits  bilaterally. Muscle power within normal limits bilaterally.  Nails Thick disfigured discolored nails with subungual debris  from hallux to fifth toes bilaterally. No evidence of bacterial infection or drainage bilaterally.  Orthopedic  No limitations of motion  feet .  No crepitus or effusions noted.  No bony pathology or digital deformities noted.  HAV  B/L.  Skin  normotropic skin with no porokeratosis noted bilaterally.  No signs of infections or ulcers noted.     Onychomycosis  Pain in right toes  Pain in left toes  Consent was obtained for treatment procedures.   Mechanical debridement of nails 1-5  bilaterally performed with a nail nipper.  Filed with dremel without incident.    Return office visit   10 weeks                   Told patient to return for periodic foot care and evaluation due to potential at risk complications.   Xaiver Roskelley DPM  

## 2020-02-09 DIAGNOSIS — Z85828 Personal history of other malignant neoplasm of skin: Secondary | ICD-10-CM | POA: Diagnosis not present

## 2020-02-09 DIAGNOSIS — D485 Neoplasm of uncertain behavior of skin: Secondary | ICD-10-CM | POA: Diagnosis not present

## 2020-02-09 DIAGNOSIS — C44729 Squamous cell carcinoma of skin of left lower limb, including hip: Secondary | ICD-10-CM | POA: Diagnosis not present

## 2020-02-09 DIAGNOSIS — L570 Actinic keratosis: Secondary | ICD-10-CM | POA: Diagnosis not present

## 2020-02-09 DIAGNOSIS — L905 Scar conditions and fibrosis of skin: Secondary | ICD-10-CM | POA: Diagnosis not present

## 2020-02-09 DIAGNOSIS — L820 Inflamed seborrheic keratosis: Secondary | ICD-10-CM | POA: Diagnosis not present

## 2020-03-06 DIAGNOSIS — C44729 Squamous cell carcinoma of skin of left lower limb, including hip: Secondary | ICD-10-CM | POA: Diagnosis not present

## 2020-03-14 ENCOUNTER — Inpatient Hospital Stay: Payer: Medicare Other | Attending: Hematology and Oncology | Admitting: Hematology and Oncology

## 2020-03-14 ENCOUNTER — Inpatient Hospital Stay: Payer: Medicare Other

## 2020-03-14 ENCOUNTER — Telehealth: Payer: Self-pay | Admitting: Hematology and Oncology

## 2020-03-14 ENCOUNTER — Telehealth: Payer: Self-pay

## 2020-03-14 ENCOUNTER — Other Ambulatory Visit: Payer: Self-pay

## 2020-03-14 ENCOUNTER — Encounter: Payer: Self-pay | Admitting: Hematology and Oncology

## 2020-03-14 VITALS — BP 168/59 | HR 84 | Temp 97.6°F | Resp 17 | Ht 64.0 in | Wt 169.2 lb

## 2020-03-14 DIAGNOSIS — N183 Chronic kidney disease, stage 3 unspecified: Secondary | ICD-10-CM | POA: Diagnosis not present

## 2020-03-14 DIAGNOSIS — C4499 Other specified malignant neoplasm of skin, unspecified: Secondary | ICD-10-CM | POA: Diagnosis not present

## 2020-03-14 DIAGNOSIS — I129 Hypertensive chronic kidney disease with stage 1 through stage 4 chronic kidney disease, or unspecified chronic kidney disease: Secondary | ICD-10-CM | POA: Diagnosis not present

## 2020-03-14 DIAGNOSIS — Z85828 Personal history of other malignant neoplasm of skin: Secondary | ICD-10-CM | POA: Insufficient documentation

## 2020-03-14 DIAGNOSIS — Z95828 Presence of other vascular implants and grafts: Secondary | ICD-10-CM

## 2020-03-14 DIAGNOSIS — C8580 Other specified types of non-Hodgkin lymphoma, unspecified site: Secondary | ICD-10-CM | POA: Diagnosis not present

## 2020-03-14 DIAGNOSIS — E1121 Type 2 diabetes mellitus with diabetic nephropathy: Secondary | ICD-10-CM | POA: Insufficient documentation

## 2020-03-14 DIAGNOSIS — Z8572 Personal history of non-Hodgkin lymphomas: Secondary | ICD-10-CM | POA: Diagnosis not present

## 2020-03-14 DIAGNOSIS — E1165 Type 2 diabetes mellitus with hyperglycemia: Secondary | ICD-10-CM | POA: Insufficient documentation

## 2020-03-14 DIAGNOSIS — I10 Essential (primary) hypertension: Secondary | ICD-10-CM | POA: Diagnosis not present

## 2020-03-14 LAB — CBC WITH DIFFERENTIAL/PLATELET
Abs Immature Granulocytes: 0.39 10*3/uL — ABNORMAL HIGH (ref 0.00–0.07)
Basophils Absolute: 0.1 10*3/uL (ref 0.0–0.1)
Basophils Relative: 1 %
Eosinophils Absolute: 0.2 10*3/uL (ref 0.0–0.5)
Eosinophils Relative: 3 %
HCT: 32.6 % — ABNORMAL LOW (ref 36.0–46.0)
Hemoglobin: 11 g/dL — ABNORMAL LOW (ref 12.0–15.0)
Immature Granulocytes: 7 %
Lymphocytes Relative: 19 %
Lymphs Abs: 1.1 10*3/uL (ref 0.7–4.0)
MCH: 29.9 pg (ref 26.0–34.0)
MCHC: 33.7 g/dL (ref 30.0–36.0)
MCV: 88.6 fL (ref 80.0–100.0)
Monocytes Absolute: 0.5 10*3/uL (ref 0.1–1.0)
Monocytes Relative: 9 %
Neutro Abs: 3.4 10*3/uL (ref 1.7–7.7)
Neutrophils Relative %: 61 %
Platelets: 250 10*3/uL (ref 150–400)
RBC: 3.68 MIL/uL — ABNORMAL LOW (ref 3.87–5.11)
RDW: 12.9 % (ref 11.5–15.5)
WBC: 5.7 10*3/uL (ref 4.0–10.5)
nRBC: 0 % (ref 0.0–0.2)

## 2020-03-14 LAB — COMPREHENSIVE METABOLIC PANEL
ALT: 14 U/L (ref 0–44)
AST: 18 U/L (ref 15–41)
Albumin: 3.6 g/dL (ref 3.5–5.0)
Alkaline Phosphatase: 91 U/L (ref 38–126)
Anion gap: 7 (ref 5–15)
BUN: 25 mg/dL — ABNORMAL HIGH (ref 8–23)
CO2: 26 mmol/L (ref 22–32)
Calcium: 8.9 mg/dL (ref 8.9–10.3)
Chloride: 104 mmol/L (ref 98–111)
Creatinine, Ser: 1.36 mg/dL — ABNORMAL HIGH (ref 0.44–1.00)
GFR, Estimated: 38 mL/min — ABNORMAL LOW (ref >60)
Glucose, Bld: 362 mg/dL — ABNORMAL HIGH (ref 70–99)
Potassium: 4.2 mmol/L (ref 3.5–5.1)
Sodium: 137 mmol/L (ref 135–145)
Total Bilirubin: 0.6 mg/dL (ref 0.3–1.2)
Total Protein: 6.3 g/dL — ABNORMAL LOW (ref 6.5–8.1)

## 2020-03-14 MED ORDER — HEPARIN SOD (PORK) LOCK FLUSH 100 UNIT/ML IV SOLN
500.0000 [IU] | Freq: Once | INTRAVENOUS | Status: AC
  Administered 2020-03-14: 500 [IU]
  Filled 2020-03-14: qty 5

## 2020-03-14 MED ORDER — SODIUM CHLORIDE 0.9% FLUSH
10.0000 mL | Freq: Once | INTRAVENOUS | Status: AC
  Administered 2020-03-14: 10 mL
  Filled 2020-03-14: qty 10

## 2020-03-14 NOTE — Assessment & Plan Note (Signed)
Clinically, she has no signs or symptoms to suggest cancer recurrence She is interested to get her port removed, which is reasonable given the fact that she has not received treatment for 2 years The patient is educated to watch her for signs and symptoms of cancer recurrence I will see her in 6 months for further follow-up

## 2020-03-14 NOTE — Assessment & Plan Note (Signed)
Her kidney function tests is stable. Continue close monitoring and risk factor modification 

## 2020-03-14 NOTE — Telephone Encounter (Signed)
Called per Dr. Alvy Bimler, instructed to follow up with PCP regarding glucose of 362 today and elevated bp. She verbalized understanding and will call PCP.

## 2020-03-14 NOTE — Progress Notes (Signed)
[place] OFFICE PROGRESS NOTE  Patient Care Team: [name], Bill Salinas, MD as PCP - General (Family Medicine) Sharyne Peach, MD as Consulting Physician (Ophthalmology) Inda Castle, MD (Inactive) as Consulting Physician (Gastroenterology) Sypher, Herbie Baltimore, MD (Inactive) as Consulting Physician (Orthopedic Surgery) Jannette Spanner, MD as Referring Physician (Dermatology) Heath Lark, MD as Consulting Physician (Hematology and Oncology) Alphonsa Overall, MD as Consulting Physician (General Surgery)  ASSESSMENT & PLAN:  Marginal zone lymphoma Clinically, she has no signs or symptoms to suggest cancer recurrence She is interested to get her port removed, which is reasonable given the fact that she has not received treatment for 2 years The patient is educated to watch her for signs and symptoms of cancer recurrence I will see her in 6 months for further follow-up  Recurrent skin cancer She has received appropriate treatment recently from dermatologist for recurrent skin cancer We discussed the importance of skin protection  Chronic kidney disease (CKD), stage III (moderate) Her kidney function tests is stable. Continue close monitoring and risk factor modification  Type 2 diabetes mellitus with diabetic nephropathy She has poorly controlled diabetes Her blood sugar control is worse recently since the death of her son We discussed the importance of dietary modification; she is advised to see her primary care doctor soon for medication adjustment  Essential hypertension Her blood pressure is elevated, worse from recent stress Observe closely for now We discussed the importance of aggressive blood pressure management   Orders Placed This Encounter  Procedures  . IR REMOVAL TUN ACCESS W/ PORT W/O FL MOD SED    Standing Status:   Future    Standing Expiration Date:   03/14/2021    Order Specific Question:   Reason for exam:    Answer:   chemo is completed    Order  Specific Question:   Preferred Imaging Location?    Answer:   [facility]    All questions were answered. The patient knows to call the clinic with any problems, questions or concerns. The total time spent in the appointment was 20 minutes encounter with patients including review of chart and various tests results, discussions about plan of care and coordination of care plan   Heath Lark, MD 03/14/2020 10:21 AM  INTERVAL HISTORY: Please see below for problem oriented charting. She returns for further follow-up She is doing well No new lymphadenopathy She did notice her blood pressure has been running high since the death of her son No recent infection, fever or chills She is interested to get her port removed  SUMMARY OF ONCOLOGIC HISTORY: Oncology History Overview Note  Marginal zone lymphoma, with lymph node and bone marrow involvement along with splenomegaly   Primary site: Lymphoid Neoplasms   Staging method: AJCC 6th Edition   Clinical: Stage IV signed by Heath Lark, MD on 09/02/2013  7:38 PM   Summary: Stage IV     Marginal zone lymphoma ([place])  07/29/2013 Imaging   CT scan shows splenomegaly and abnormal lymphadenopathy.   08/13/2013 Imaging   PET CT scan confirmed lymphadenopathy and splenomegaly, those areas are PET avid   08/23/2013 Surgery   [id number] left axillary lymph node biopsy confirmed a low-grade lymphoma.   08/27/2013 Bone Marrow Biopsy   Bone marrow biopsy confirmed lymphoma.[id number]   09/08/2013 - 09/29/2013 Chemotherapy   She completed 4 cycles of rituximab with resolution of splenomegaly.   12/29/2013 Imaging   PET scan show complete response.   07/11/2014 Imaging   PET scan  showed recurrent disease   07/19/2014 - 08/09/2014 Chemotherapy   She completed 4 cycles of rituximab with resolution of splenomegaly   10/12/2014 Imaging   Repeat PET/CT scan showed near complete response to treatment.   10/13/2014 - 02/09/2015 Chemotherapy   She is  placed on rituximab maintenance therapy.   03/29/2015 Imaging   PET CT showed possible mild progression of splenomegaly   04/26/2015 - 02/22/2016 Chemotherapy   She started taking Ibrutinib   04/28/2015 Adverse Reaction   Treatment was placed temporarily on hold due to severe diarrhea and resumed at reduced dose   07/03/2015 PET scan   No hypermetabolic adenopathy in the neck, chest, abdomen or pelvis.2. Spleen is not hypermetabolic and appears slightly less prominent size.   02/06/2016 PET scan   Hypermetabolic LEFT axillary lymph nodes. Concern for HIGH-GRADE LYMPHOMA LOCALIZED RECURRENCE. 2. Prominent spleen without hypermetabolic activity. No change in size from comparison. 3. No additional hypermetabolic adenopathy on scan.   02/19/2016 Procedure   Technically successful ultrasound-guided core biopsy, left axillary adenopathy.   02/19/2016 Pathology Results   Lymph node, needle/core biopsy, left axilla - ATYPICAL LYMPHOID PROLIFERATION SUSPICIOUS FOR LYMPHOMA. - SEE COMMENT. Microscopic Comment The sections show multiple very small and skinny fragments of lymph nodal tissue displaying areas of lymphoid expansion by a population of small to medium size lymphocytes with round to irregular nuclei, scanty to moderately abundant cytoplasm, dense chromatin and inconspicuous nucleoli. This is associated with scattering of "naked" germinal centers with abundant tingible body macrophages. To further evaluate this process, flow cytometric analysis was performed and shows a minor population of monoclonal B cells expressing pan B-cell antigens including CD20 with associated kappa light chain restriction. In addition, immunohistochemical stains were performed including CD20, CD79a, CD3, CD5, CD10, BCL-2, CD21 with appropriate controls. The stains show a mixture of T and B cells with slight predominance of B cells. CD21 highlights numerous areas with dendritic networks, correlating with the presence of  germinal centers. CD10 shows focal positivity likely in germinal centers. BCL-2 is diffusely positive except in areas of apparent germinal centers. The overall features are atypical and suspicious for involvement by a B-cell lymphoproliferative process, particularly given the B-cell monoclonality by flow cytometry. However, morphologic evaluation is extremely limited due to small biopsy fragments and additional material (excisional biopsy) is strongly recommended for further evaluation. (BNS:ecj 02/21/2016)    03/04/2016 Procedure   Placement of single lumen port a cath via right internal jugular vein. The catheter tip lies at the cavo-atrial junction. A power injectable port a cath was placed and is ready for immediate use.   03/07/2016 - 05/31/2016 Chemotherapy   She received treatment with bendamustine and Gazyva   05/27/2016 PET scan   1. No evidence of active lymphoma on whole-body scan. 2. Resolution of metabolic activity previously identified in the LEFT axilla. 3. Small amount free fluid the pelvis.   03/07/2017 PET scan   1. No findings of active lymphoma in the chest, abdomen, or pelvis. 2. Other imaging findings of potential clinical significance: Aortic Atherosclerosis (ICD10-I70.0). Coronary atherosclerosis. Chronic paranasal sinusitis. Moderately distended gallbladder, chronic     REVIEW OF SYSTEMS:   Constitutional: Denies fevers, chills or abnormal weight loss Eyes: Denies blurriness of vision Ears, nose, mouth, throat, and face: Denies mucositis or sore throat Respiratory: Denies cough, dyspnea or wheezes Cardiovascular: Denies palpitation, chest discomfort or lower extremity swelling Gastrointestinal:  Denies nausea, heartburn or change in bowel habits Skin: Denies abnormal skin rashes Lymphatics: Denies new lymphadenopathy or   easy bruising Neurological:Denies numbness, tingling or new weaknesses Behavioral/Psych: Mood is stable, no new changes  All other systems were reviewed  with the patient and are negative.  I have reviewed the past medical history, past surgical history, social history and family history with the patient and they are unchanged from previous note.  ALLERGIES:  is allergic to aspirin, capoten [captopril], levemir [insulin detemir], micronase [glyburide], onglyza [saxagliptin], statins, and zocor [simvastatin].  MEDICATIONS:  Current Outpatient Medications  Medication Sig Dispense Refill  . Accu-Chek FastClix Lancets MISC USE 1 TO CHECK GLUCOSE ONCE DAILY    . acyclovir (ZOVIRAX) 400 MG tablet Take 1 tablet by mouth once daily 30 tablet 11  . amLODipine (NORVASC) 10 MG tablet TAKE 1 TABLET BY MOUTH AT BEDTIME 90 tablet 0  . aspirin 81 MG tablet Take 40.5 mg by mouth 3 (three) times a week.    . Blood Glucose Monitoring Suppl (ACCU-CHEK GUIDE) w/Device KIT See admin instructions.    . Calcium Carbonate-Vit D-Min (CALCIUM 1200 PO) Take 1 tablet by mouth daily. Per patient takes in am and pm    . cholecalciferol (VITAMIN D) 1000 UNITS tablet Take 1,000 Units by mouth daily.     . enalapril (VASOTEC) 20 MG tablet Take 20 mg by mouth every morning.    . fish oil-omega-3 fatty acids 1000 MG capsule Take 1 g by mouth daily.     . glimepiride (AMARYL) 4 MG tablet Take 4 mg by mouth daily with breakfast. Per patient takes in am and pm.    . glucose blood test strip Use as instructed 100 each 12  . lidocaine-prilocaine (EMLA) cream Apply 1 application topically as needed. 30 g 3  . loperamide (IMODIUM) 2 MG capsule Take by mouth as needed for diarrhea or loose stools.    . Magnesium 500 MG CAPS Take 500 mg by mouth daily.     . metFORMIN (GLUCOPHAGE) 1000 MG tablet Take 1,000 mg by mouth 2 (two) times daily with a meal.     . Misc Natural Products (LUTEIN 20 PO) Take by mouth daily.    . Multiple Vitamins-Minerals (CENTRUM CARDIO) TABS Take 1 tablet by mouth daily.    . Multiple Vitamins-Minerals (ICAPS) CAPS Take by mouth daily.     No current  facility-administered medications for this visit.    PHYSICAL EXAMINATION: ECOG PERFORMANCE STATUS: 0 - Asymptomatic  Vitals:   03/14/20 0957  BP: (!) 168/59  Pulse: 84  Resp: 17  Temp: 97.6 F (36.4 C)  SpO2: 100%   Filed Weights   03/14/20 0957  Weight: 169 lb 3.2 oz (76.7 kg)    GENERAL:alert, no distress and comfortable SKIN: skin color, texture, turgor are normal, no rashes or significant lesions EYES: normal, Conjunctiva are pink and non-injected, sclera clear OROPHARYNX:no exudate, no erythema and lips, buccal mucosa, and tongue normal  NECK: supple, thyroid normal size, non-tender, without nodularity LYMPH:  no palpable lymphadenopathy in the cervical, axillary or inguinal LUNGS: clear to auscultation and percussion with normal breathing effort HEART: regular rate & rhythm and no murmurs and no lower extremity edema ABDOMEN:abdomen soft, non-tender and normal bowel sounds Musculoskeletal:no cyanosis of digits and no clubbing  NEURO: alert & oriented x 3 with fluent speech, no focal motor/sensory deficits  LABORATORY DATA:  I have reviewed the data as listed    Component Value Date/Time   NA 137 03/14/2020 0941   NA 136 01/07/2017 1004   K 4.2 03/14/2020 0941   K 4.5   01/07/2017 1004   CL 104 03/14/2020 0941   CO2 26 03/14/2020 0941   CO2 23 01/07/2017 1004   GLUCOSE 362 (H) 03/14/2020 0941   GLUCOSE 217 (H) 01/07/2017 1004   BUN 25 (H) 03/14/2020 0941   BUN 23.3 01/07/2017 1004   CREATININE 1.36 (H) 03/14/2020 0941   CREATININE 1.37 (H) 07/06/2018 0959   CREATININE 1.2 (H) 01/07/2017 1004   CALCIUM 8.9 03/14/2020 0941   CALCIUM 9.4 01/07/2017 1004   PROT 6.3 (L) 03/14/2020 0941   PROT 6.7 01/07/2017 1004   ALBUMIN 3.6 03/14/2020 0941   ALBUMIN 3.9 01/07/2017 1004   AST 18 03/14/2020 0941   AST 15 07/06/2018 0959   AST 20 01/07/2017 1004   ALT 14 03/14/2020 0941   ALT 10 07/06/2018 0959   ALT 14 01/07/2017 1004   ALKPHOS 91 03/14/2020 0941    ALKPHOS 70 01/07/2017 1004   BILITOT 0.6 03/14/2020 0941   BILITOT 0.4 07/06/2018 0959   BILITOT 0.49 01/07/2017 1004   GFRNONAA 38 (L) 03/14/2020 0941   GFRNONAA 36 (L) 07/06/2018 0959   GFRNONAA 47 (L) 05/18/2013 1256   GFRAA 40 (L) 09/28/2019 1053   GFRAA 41 (L) 07/06/2018 0959   GFRAA 55 (L) 05/18/2013 1256    No results found for: SPEP, UPEP  Lab Results  Component Value Date   WBC 5.7 03/14/2020   NEUTROABS 3.4 03/14/2020   HGB 11.0 (L) 03/14/2020   HCT 32.6 (L) 03/14/2020   MCV 88.6 03/14/2020   PLT 250 03/14/2020      Chemistry      Component Value Date/Time   NA 137 03/14/2020 0941   NA 136 01/07/2017 1004   K 4.2 03/14/2020 0941   K 4.5 01/07/2017 1004   CL 104 03/14/2020 0941   CO2 26 03/14/2020 0941   CO2 23 01/07/2017 1004   BUN 25 (H) 03/14/2020 0941   BUN 23.3 01/07/2017 1004   CREATININE 1.36 (H) 03/14/2020 0941   CREATININE 1.37 (H) 07/06/2018 0959   CREATININE 1.2 (H) 01/07/2017 1004   GLU 202 (H) 08/09/2014 1000      Component Value Date/Time   CALCIUM 8.9 03/14/2020 0941   CALCIUM 9.4 01/07/2017 1004   ALKPHOS 91 03/14/2020 0941   ALKPHOS 70 01/07/2017 1004   AST 18 03/14/2020 0941   AST 15 07/06/2018 0959   AST 20 01/07/2017 1004   ALT 14 03/14/2020 0941   ALT 10 07/06/2018 0959   ALT 14 01/07/2017 1004   BILITOT 0.6 03/14/2020 0941   BILITOT 0.4 07/06/2018 0959   BILITOT 0.49 01/07/2017 1004

## 2020-03-14 NOTE — Assessment & Plan Note (Signed)
She has poorly controlled diabetes Her blood sugar control is worse recently since the death of her son We discussed the importance of dietary modification; she is advised to see her primary care doctor soon for medication adjustment

## 2020-03-14 NOTE — Telephone Encounter (Signed)
Scheduled appts per 2/15 los. Gave pt a print out of AVS.

## 2020-03-14 NOTE — Assessment & Plan Note (Signed)
She has received appropriate treatment recently from dermatologist for recurrent skin cancer We discussed the importance of skin protection

## 2020-03-14 NOTE — Assessment & Plan Note (Signed)
Her blood pressure is elevated, worse from recent stress Observe closely for now We discussed the importance of aggressive blood pressure management

## 2020-03-20 DIAGNOSIS — D0472 Carcinoma in situ of skin of left lower limb, including hip: Secondary | ICD-10-CM | POA: Diagnosis not present

## 2020-03-21 DIAGNOSIS — N1831 Chronic kidney disease, stage 3a: Secondary | ICD-10-CM | POA: Diagnosis not present

## 2020-03-21 DIAGNOSIS — M810 Age-related osteoporosis without current pathological fracture: Secondary | ICD-10-CM | POA: Diagnosis not present

## 2020-03-21 DIAGNOSIS — E119 Type 2 diabetes mellitus without complications: Secondary | ICD-10-CM | POA: Diagnosis not present

## 2020-03-21 DIAGNOSIS — E1169 Type 2 diabetes mellitus with other specified complication: Secondary | ICD-10-CM | POA: Diagnosis not present

## 2020-03-21 DIAGNOSIS — I10 Essential (primary) hypertension: Secondary | ICD-10-CM | POA: Diagnosis not present

## 2020-03-21 DIAGNOSIS — E785 Hyperlipidemia, unspecified: Secondary | ICD-10-CM | POA: Diagnosis not present

## 2020-04-04 ENCOUNTER — Ambulatory Visit: Payer: Medicare Other | Admitting: Podiatry

## 2020-04-04 ENCOUNTER — Encounter: Payer: Self-pay | Admitting: Podiatry

## 2020-04-04 ENCOUNTER — Other Ambulatory Visit: Payer: Self-pay

## 2020-04-04 DIAGNOSIS — E1121 Type 2 diabetes mellitus with diabetic nephropathy: Secondary | ICD-10-CM | POA: Diagnosis not present

## 2020-04-04 DIAGNOSIS — B351 Tinea unguium: Secondary | ICD-10-CM

## 2020-04-04 DIAGNOSIS — N183 Chronic kidney disease, stage 3 unspecified: Secondary | ICD-10-CM

## 2020-04-04 DIAGNOSIS — M79674 Pain in right toe(s): Secondary | ICD-10-CM | POA: Diagnosis not present

## 2020-04-04 DIAGNOSIS — M79675 Pain in left toe(s): Secondary | ICD-10-CM | POA: Diagnosis not present

## 2020-04-04 NOTE — Progress Notes (Signed)
This patient returns to my office for at risk foot care.  This patient requires this care by a professional since this patient will be at risk due to having chronic kidney disease and type 2 diabetes.  This patient is unable to cut nails herself since the patient cannot reach her nails.These nails are painful walking and wearing shoes.  This patient presents for at risk foot care today.  General Appearance  Alert, conversant and in no acute stress.  Vascular  Dorsalis pedis and posterior tibial  pulses are palpable  bilaterally.  Capillary return is within normal limits  bilaterally. Temperature is within normal limits  bilaterally.  Neurologic  Senn-Weinstein monofilament wire test within normal limits  bilaterally. Muscle power within normal limits bilaterally.  Nails Thick disfigured discolored nails with subungual debris  from hallux to fifth toes bilaterally. No evidence of bacterial infection or drainage bilaterally.  Orthopedic  No limitations of motion  feet .  No crepitus or effusions noted.  No bony pathology or digital deformities noted.  HAV  B/L.  Skin  normotropic skin with no porokeratosis noted bilaterally.  No signs of infections or ulcers noted.     Onychomycosis  Pain in right toes  Pain in left toes  Consent was obtained for treatment procedures.   Mechanical debridement of nails 1-5  bilaterally performed with a nail nipper.  Filed with dremel without incident.    Return office visit   10 weeks                   Told patient to return for periodic foot care and evaluation due to potential at risk complications.   Gregory Mayer DPM  

## 2020-04-17 ENCOUNTER — Telehealth: Payer: Self-pay

## 2020-04-17 NOTE — Telephone Encounter (Signed)
RN spoke with patient to inform that port removal has been scheduled for 4/5 @ 12pm noon.  NPO 4 hours prior, and must have driver present.  Pt verbalized understanding, no further needs at this time.

## 2020-04-18 DIAGNOSIS — I10 Essential (primary) hypertension: Secondary | ICD-10-CM | POA: Diagnosis not present

## 2020-04-18 DIAGNOSIS — E1169 Type 2 diabetes mellitus with other specified complication: Secondary | ICD-10-CM | POA: Diagnosis not present

## 2020-04-27 DIAGNOSIS — E1169 Type 2 diabetes mellitus with other specified complication: Secondary | ICD-10-CM | POA: Diagnosis not present

## 2020-04-27 DIAGNOSIS — M810 Age-related osteoporosis without current pathological fracture: Secondary | ICD-10-CM | POA: Diagnosis not present

## 2020-04-27 DIAGNOSIS — N1831 Chronic kidney disease, stage 3a: Secondary | ICD-10-CM | POA: Diagnosis not present

## 2020-04-27 DIAGNOSIS — E785 Hyperlipidemia, unspecified: Secondary | ICD-10-CM | POA: Diagnosis not present

## 2020-04-27 DIAGNOSIS — I10 Essential (primary) hypertension: Secondary | ICD-10-CM | POA: Diagnosis not present

## 2020-04-27 DIAGNOSIS — E119 Type 2 diabetes mellitus without complications: Secondary | ICD-10-CM | POA: Diagnosis not present

## 2020-05-01 ENCOUNTER — Other Ambulatory Visit: Payer: Self-pay | Admitting: Radiology

## 2020-05-02 ENCOUNTER — Other Ambulatory Visit: Payer: Self-pay

## 2020-05-02 ENCOUNTER — Other Ambulatory Visit: Payer: Self-pay | Admitting: Radiology

## 2020-05-02 ENCOUNTER — Ambulatory Visit (HOSPITAL_COMMUNITY)
Admission: RE | Admit: 2020-05-02 | Discharge: 2020-05-02 | Disposition: A | Discharge status: Home / Self Care | Payer: Medicare Other | Source: Ambulatory Visit | Attending: Hematology and Oncology | Admitting: Hematology and Oncology

## 2020-05-02 ENCOUNTER — Encounter (HOSPITAL_COMMUNITY): Payer: Self-pay

## 2020-05-02 DIAGNOSIS — Z886 Allergy status to analgesic agent status: Secondary | ICD-10-CM | POA: Insufficient documentation

## 2020-05-02 DIAGNOSIS — Z7982 Long term (current) use of aspirin: Secondary | ICD-10-CM | POA: Diagnosis not present

## 2020-05-02 DIAGNOSIS — Z888 Allergy status to other drugs, medicaments and biological substances status: Secondary | ICD-10-CM | POA: Diagnosis not present

## 2020-05-02 DIAGNOSIS — Z9221 Personal history of antineoplastic chemotherapy: Secondary | ICD-10-CM | POA: Insufficient documentation

## 2020-05-02 DIAGNOSIS — Z79899 Other long term (current) drug therapy: Secondary | ICD-10-CM | POA: Diagnosis not present

## 2020-05-02 DIAGNOSIS — Z7984 Long term (current) use of oral hypoglycemic drugs: Secondary | ICD-10-CM | POA: Insufficient documentation

## 2020-05-02 DIAGNOSIS — C8590 Non-Hodgkin lymphoma, unspecified, unspecified site: Secondary | ICD-10-CM | POA: Insufficient documentation

## 2020-05-02 DIAGNOSIS — Z8572 Personal history of non-Hodgkin lymphomas: Secondary | ICD-10-CM | POA: Diagnosis not present

## 2020-05-02 DIAGNOSIS — C8580 Other specified types of non-Hodgkin lymphoma, unspecified site: Secondary | ICD-10-CM

## 2020-05-02 DIAGNOSIS — Z452 Encounter for adjustment and management of vascular access device: Secondary | ICD-10-CM | POA: Diagnosis not present

## 2020-05-02 HISTORY — PX: IR REMOVAL TUN ACCESS W/ PORT W/O FL MOD SED: IMG2290

## 2020-05-02 LAB — GLUCOSE, CAPILLARY: Glucose-Capillary: 180 mg/dL — ABNORMAL HIGH (ref 70–99)

## 2020-05-02 MED ORDER — MIDAZOLAM HCL 2 MG/2ML IJ SOLN
INTRAMUSCULAR | Status: AC | PRN
  Administered 2020-05-02 (×2): 1 mg via INTRAVENOUS

## 2020-05-02 MED ORDER — FENTANYL CITRATE (PF) 100 MCG/2ML IJ SOLN
INTRAMUSCULAR | Status: AC
  Filled 2020-05-02: qty 2

## 2020-05-02 MED ORDER — CEFAZOLIN SODIUM-DEXTROSE 2-4 GM/100ML-% IV SOLN: 2.0000 g | INTRAVENOUS | Status: DC

## 2020-05-02 MED ORDER — FENTANYL CITRATE (PF) 100 MCG/2ML IJ SOLN
INTRAMUSCULAR | Status: AC | PRN
  Administered 2020-05-02 (×2): 50 ug via INTRAVENOUS

## 2020-05-02 MED ORDER — LIDOCAINE-EPINEPHRINE 1 %-1:100000 IJ SOLN
INTRAMUSCULAR | Status: AC | PRN
  Administered 2020-05-02: 10 mL

## 2020-05-02 MED ORDER — MIDAZOLAM HCL 2 MG/2ML IJ SOLN
INTRAMUSCULAR | Status: AC
  Filled 2020-05-02: qty 2

## 2020-05-02 MED ORDER — LIDOCAINE HCL 1 % IJ SOLN
INTRAMUSCULAR | Status: AC
  Filled 2020-05-02: qty 20

## 2020-05-02 NOTE — Discharge Instructions (Signed)
Please call Interventional Radiology clinic [phone] with any questions or concerns.  You may remove your dressing and shower tomorrow.   Implanted Port Removal, Care After This sheet gives you information about how to care for yourself after your procedure. Your health care provider may also give you more specific instructions. If you have problems or questions, contact your health care provider. What can I expect after the procedure? After the procedure, it is common to have: Soreness or pain near your incision. Some swelling or bruising near your incision. Follow these instructions at home: Medicines Take over-the-counter and prescription medicines only as told by your health care provider. If you were prescribed an antibiotic medicine, take it as told by your health care provider. Do not stop taking the antibiotic even if you start to feel better. Bathing Do not take baths, swim, or use a hot tub until your health care provider approves. Ask your health care provider if you can take showers. You may only be allowed to take sponge baths. Incision care Follow instructions from your health care provider about how to take care of your incision. Make sure you: Wash your hands with soap and water before you change your bandage (dressing). If soap and water are not available, use hand sanitizer. Change your dressing as told by your health care provider. Keep your dressing dry. Leave stitches (sutures), skin glue, or adhesive strips in place. These skin closures may need to stay in place for 2 weeks or longer. If adhesive strip edges start to loosen and curl up, you may trim the loose edges. Do not remove adhesive strips completely unless your health care provider tells you to do that. Check your incision area every day for signs of infection. Check for: More redness, swelling, or pain. More fluid or blood. Warmth. Pus or a bad smell.    Driving Do not drive for 24 hours if you were  given a medicine to help you relax (sedative) during your procedure. If you did not receive a sedative, ask your health care provider when it is safe to drive.    Activity Return to your normal activities as told by your health care provider. Ask your health care provider what activities are safe for you. Do not lift anything that is heavier than 10 lb (4.5 kg), or the limit that you are told, until your health care provider says that it is safe. Do not do activities that involve lifting your arms over your head. General instructions Do not use any products that contain nicotine or tobacco, such as cigarettes and e-cigarettes. These can delay healing. If you need help quitting, ask your health care provider. Keep all follow-up visits as told by your health care provider. This is important. Contact a health care provider if: You have more redness, swelling, or pain around your incision. You have more fluid or blood coming from your incision. Your incision feels warm to the touch. You have pus or a bad smell coming from your incision. You have pain that is not relieved by your pain medicine. Get help right away if you have: A fever or chills. Chest pain. Difficulty breathing. Summary After the procedure, it is common to have pain, soreness, swelling, or bruising near your incision. If you were prescribed an antibiotic medicine, take it as told by your health care provider. Do not stop taking the antibiotic even if you start to feel better. Do not drive for 24 hours if you were given a sedative during   your procedure. Return to your normal activities as told by your health care provider. Ask your health care provider what activities are safe for you. This information is not intended to replace advice given to you by your health care provider. Make sure you discuss any questions you have with your health care provider. Document Revised: 02/27/2017 Document Reviewed: 02/27/2017 Elsevier Patient  Education  2021 [employer] Inc[employer]   Moderate Conscious Sedation, Adult, Care After This sheet gives you information about how to care for yourself after your procedure. Your health care provider may also give you more specific instructions. If you have problems or questions, contact your health care provider. What can I expect after the procedure? After the procedure, it is common to have: Sleepiness for several hours. Impaired judgment for several hours. Difficulty with balance. Vomiting if you eat too soon. Follow these instructions at home: For the time period you were told by your health care provider: Rest. Do not participate in activities where you could fall or become injured. Do not drive or use machinery. Do not drink alcohol. Do not take sleeping pills or medicines that cause drowsiness. Do not make important decisions or sign legal documents. Do not take care of children on your own.        Eating and drinking Follow the diet recommended by your health care provider. Drink enough fluid to keep your urine pale yellow. If you vomit: Drink water, juice, or soup when you can drink without vomiting. Make sure you have little or no nausea before eating solid foods.    General instructions Take over-the-counter and prescription medicines only as told by your health care provider. Have a responsible adult stay with you for the time you are told. It is important to have someone help care for you until you are awake and alert. Do not smoke. Keep all follow-up visits as told by your health care provider. This is important. Contact a health care provider if: You are still sleepy or having trouble with balance after 24 hours. You feel light-headed. You keep feeling nauseous or you keep vomiting. You develop a rash. You have a fever. You have redness or swelling around the IV site. Get help right away if: You have trouble breathing. You have new-onset confusion at  home. Summary After the procedure, it is common to feel sleepy, have impaired judgment, or feel nauseous if you eat too soon. Rest after you get home. Know the things you should not do after the procedure. Follow the diet recommended by your health care provider and drink enough fluid to keep your urine pale yellow. Get help right away if you have trouble breathing or new-onset confusion at home. This information is not intended to replace advice given to you by your health care provider. Make sure you discuss any questions you have with your health care provider. Document Revised: 05/14/2019 Document Reviewed: 12/10/2018 Elsevier Patient Education  2021 Elsevier Inc.   

## 2020-05-02 NOTE — Procedures (Signed)
Interventional Radiology Procedure Note  Procedure: Port removal  Complications: None  Estimated Blood Loss: < 10 mL  Findings: Right chest port removed utilizing sharp and blunt dissection. Wound closed.  Venetia Night. Kathlene Cote, M.D Pager:  [phone]

## 2020-05-02 NOTE — H&P (Signed)
Referring Physician(s): Heath Lark  Supervising Physician: Aletta Edouard  Patient Status:  [facility]  Chief Complaint: "I'm getting my port out"   Subjective: Patient familiar to IR service from left axillary lymph node biopsy in 2018 and Port-A-Cath placement in 2018.  She has a history of marginal zone lymphoma, initially diagnosed in 2015, status post chemotherapy.  Patient has had no treatment in approximately 2 years, has no clinical evidence of recurrence and presents today for Port-A-Cath removal.  She currently denies fever, headache, chest pain, dyspnea, cough, abdominal/back pain, nausea, vomiting or bleeding.  Additional medical history as below.  Past Medical History:  Diagnosis Date  . Anemia   . Diabetes ([place])   . High blood pressure   . Hyperlipidemia   . Marginal zone lymphoma ([place]) 09/02/2013  . Neuropathy   . Splenomegaly 07/23/2013  . Wears glasses   . Weight loss 07/23/2013   Past Surgical History:  Procedure Laterality Date  . AXILLARY LYMPH NODE BIOPSY Left 08/23/2013   Procedure: LEFT AXILLARY LYMPH NODE BIOPSY;  Surgeon: Shann Medal, MD;  Location: [place];  Service: General;  Laterality: Left;  . BREAST BIOPSY Left    30 years ago, benign   . BREAST SURGERY Right 1989   biopsy-benign  . COLONOSCOPY    . ESOPHAGOGASTRODUODENOSCOPY    . IR GENERIC HISTORICAL  03/04/2016   IR FLUORO GUIDE PORT INSERTION RIGHT 03/04/2016 Aletta Edouard, MD [facility]-INTER[facility] RAD  . IR GENERIC HISTORICAL  03/04/2016   IR US GUIDE VASC ACCESS RIGHT 03/04/2016 Aletta Edouard, MD [facility]  . POLYPECTOMY  1989  . TONSILLECTOMY        Allergies: Aspirin, Capoten [captopril], Glyburide, Insulin detemir, Other, Saxagliptin, Statins, and Zocor [simvastatin]  Medications: Prior to Admission medications   Medication Sig Start Date End Date Taking? Authorizing Provider  Accu-Chek FastClix Lancets MISC USE 1 TO CHECK GLUCOSE ONCE DAILY 10/28/18  Yes [provider]  acyclovir (ZOVIRAX) 400 MG tablet Take 1 tablet by mouth once daily 04/26/19  Yes Gorsuch, Ni, MD  amLODipine (NORVASC) 10 MG tablet TAKE 1 TABLET BY MOUTH AT BEDTIME 02/17/19  Yes Gorsuch, Ni, MD  aspirin 81 MG tablet Take 40.5 mg by mouth 3 (three) times a week.   Yes [provider]  Blood Glucose Monitoring Suppl (ACCU-CHEK GUIDE) w/Device KIT See admin instructions. 04/08/19  Yes [provider]  Calcium Carbonate-Vit D-Min (CALCIUM 1200 PO) Take 1 tablet by mouth daily. Per patient takes in am and pm   Yes [provider]  cholecalciferol (VITAMIN D) 1000 UNITS tablet Take 1,000 Units by mouth daily.    Yes [provider]  enalapril (VASOTEC) 20 MG tablet Take 20 mg by mouth every morning.   Yes [provider]  fish oil-omega-3 fatty acids 1000 MG capsule Take 1 g by mouth daily.    Yes [provider]  glimepiride (AMARYL) 4 MG tablet Take 4 mg by mouth daily with breakfast. Per patient takes in am and pm.   Yes [provider]  glucose blood test strip Use as instructed 04/15/13  Yes Smith, Melissa, PA-C  lidocaine-prilocaine (EMLA) cream Apply 1 application topically as needed. 03/07/16  Yes Gorsuch, Ni, MD  loperamide (IMODIUM) 2 MG capsule Take by mouth as needed for diarrhea or loose stools.   Yes [provider]  Magnesium 500 MG CAPS Take 500 mg by mouth daily.    Yes [provider]  metFORMIN (GLUCOPHAGE)  1000 MG tablet Take 1,000 mg by mouth 2 (two) times daily with a meal.    Yes [provider]  Misc Natural Products (LUTEIN 20 PO) Take by mouth daily.   Yes [provider]  Multiple Vitamins-Minerals (CENTRUM CARDIO) TABS Take 1 tablet by mouth daily.   Yes [provider]  Multiple Vitamins-Minerals (ICAPS) CAPS Take by mouth daily.   Yes [provider]  allopurinol (ZYLOPRIM) 300 MG tablet Take 1 tablet (300 mg total) by mouth daily. 02/22/16 05/31/16   Heath Lark, MD  prochlorperazine (COMPAZINE) 10 MG tablet Take 1 tablet (10 mg total) by mouth every 6 (six) hours as needed (Nausea or vomiting). Patient not taking: Reported on 05/30/2016 02/22/16 05/31/16  Heath Lark, MD     Vital Signs: BP (!) 148/75   Pulse 100   Temp 98.9 F (37.2 C) (Oral)   Resp 18   SpO2 98%   Physical Exam awake, alert.  Chest clear to auscultation bilaterally.  Heart with regular rate and rhythm.  Positive murmur.  Abdomen soft, positive bowel sounds, nontender.  Trace pretibial edema bilaterally.  Imaging: No results found.  Labs:  CBC: Recent Labs    09/28/19 1053 11/23/19 0941 01/18/20 0800 03/14/20 0941  WBC 5.8 6.0 6.2 5.7  HGB 10.6* 10.7* 11.1* 11.0*  HCT 31.7* 32.2* 32.9* 32.6*  PLT 252 290 282 250    COAGS: No results for input(s): INR, APTT in the last 8760 hours.  BMP: Recent Labs    06/08/19 1045 08/03/19 1105 09/28/19 1053 11/23/19 0941 01/18/20 0800 03/14/20 0941  NA 139 138 137 138 138 137  K 4.2 4.2 4.4 4.2 4.3 4.2  CL 105 104 102 107 105 104  CO2 _0 GLUCOSE 125* 78 359* 155* 254* 362*  BUN _1 26* 25*  CALCIUM 9.6 9.8 9.8 8.8* 9.0 8.9  CREATININE 1.24* 1.17* 1.41* 1.05* 1.32* 1.36*  GFRNONAA 40* 43* 34* 52* 40* 38*  GFRAA 47* 50* 40*  --   --   --     LIVER FUNCTION TESTS: Recent Labs    09/28/19 1053 11/23/19 0941 01/18/20 0800 03/14/20 0941  BILITOT 0.5 0.5 0.7 0.6  AST _2 ALT _3 ALKPHOS 87 79 89 91  PROT 6.2* 6.2* 6.6 6.3*  ALBUMIN 3.4* 3.5 3.7 3.6    Assessment and Plan: Patient familiar to IR service from left axillary lymph node biopsy in 2018 and Port-A-Cath placement in 2018.  She has a history of marginal zone lymphoma, initially diagnosed in 2015, status post chemotherapy.  Patient has had no treatment in approximately 2 years, has no clinical evidence of recurrence and presents today for Port-A-Cath removal.  Details/risks of procedure, including but  not limited to, internal bleeding, infection, injury to adjacent structures discussed with patient with her understanding and consent.   Electronically Signed: D. Rowe Robert, PA-C 05/02/2020, 12:55 PM   I spent a total of 20 minutes at the the patient's bedside AND on the patient's hospital floor or unit, greater than 50% of which was counseling/coordinating care for Port-A-Cath removal

## 2020-05-23 DIAGNOSIS — E1169 Type 2 diabetes mellitus with other specified complication: Secondary | ICD-10-CM | POA: Diagnosis not present

## 2020-05-23 DIAGNOSIS — I10 Essential (primary) hypertension: Secondary | ICD-10-CM | POA: Diagnosis not present

## 2020-05-23 DIAGNOSIS — E119 Type 2 diabetes mellitus without complications: Secondary | ICD-10-CM | POA: Diagnosis not present

## 2020-05-23 DIAGNOSIS — N1831 Chronic kidney disease, stage 3a: Secondary | ICD-10-CM | POA: Diagnosis not present

## 2020-05-23 DIAGNOSIS — M810 Age-related osteoporosis without current pathological fracture: Secondary | ICD-10-CM | POA: Diagnosis not present

## 2020-05-23 DIAGNOSIS — E785 Hyperlipidemia, unspecified: Secondary | ICD-10-CM | POA: Diagnosis not present

## 2020-06-14 ENCOUNTER — Other Ambulatory Visit: Payer: Self-pay

## 2020-06-14 ENCOUNTER — Encounter: Payer: Self-pay | Admitting: Podiatry

## 2020-06-14 ENCOUNTER — Ambulatory Visit: Payer: Medicare Other | Admitting: Podiatry

## 2020-06-14 DIAGNOSIS — M810 Age-related osteoporosis without current pathological fracture: Secondary | ICD-10-CM | POA: Insufficient documentation

## 2020-06-14 DIAGNOSIS — F4321 Adjustment disorder with depressed mood: Secondary | ICD-10-CM | POA: Insufficient documentation

## 2020-06-14 DIAGNOSIS — M79675 Pain in left toe(s): Secondary | ICD-10-CM | POA: Diagnosis not present

## 2020-06-14 DIAGNOSIS — Z8579 Personal history of other malignant neoplasms of lymphoid, hematopoietic and related tissues: Secondary | ICD-10-CM | POA: Insufficient documentation

## 2020-06-14 DIAGNOSIS — E1121 Type 2 diabetes mellitus with diabetic nephropathy: Secondary | ICD-10-CM

## 2020-06-14 DIAGNOSIS — N183 Chronic kidney disease, stage 3 unspecified: Secondary | ICD-10-CM

## 2020-06-14 DIAGNOSIS — B351 Tinea unguium: Secondary | ICD-10-CM

## 2020-06-14 DIAGNOSIS — M79674 Pain in right toe(s): Secondary | ICD-10-CM | POA: Diagnosis not present

## 2020-06-14 DIAGNOSIS — C8387 Other non-follicular lymphoma, spleen: Secondary | ICD-10-CM | POA: Insufficient documentation

## 2020-06-14 DIAGNOSIS — Z789 Other specified health status: Secondary | ICD-10-CM | POA: Insufficient documentation

## 2020-06-14 NOTE — Progress Notes (Signed)
This patient returns to my office for at risk foot care.  This patient requires this care by a professional since this patient will be at risk due to having chronic kidney disease and type 2 diabetes.  This patient is unable to cut nails herself since the patient cannot reach her nails.These nails are painful walking and wearing shoes.  This patient presents for at risk foot care today.  General Appearance  Alert, conversant and in no acute stress.  Vascular  Dorsalis pedis and posterior tibial  pulses are palpable  bilaterally.  Capillary return is within normal limits  bilaterally. Temperature is within normal limits  bilaterally.  Neurologic  Senn-Weinstein monofilament wire test within normal limits  bilaterally. Muscle power within normal limits bilaterally.  Nails Thick disfigured discolored nails with subungual debris  from hallux to fifth toes bilaterally. No evidence of bacterial infection or drainage bilaterally.  Orthopedic  No limitations of motion  feet .  No crepitus or effusions noted.  No bony pathology or digital deformities noted.  HAV  B/L.  Skin  normotropic skin with no porokeratosis noted bilaterally.  No signs of infections or ulcers noted.     Onychomycosis  Pain in right toes  Pain in left toes  Consent was obtained for treatment procedures.   Mechanical debridement of nails 1-5  bilaterally performed with a nail nipper.  Filed with dremel without incident.    Return office visit   10 weeks                   Told patient to return for periodic foot care and evaluation due to potential at risk complications.   Destenee Guerry DPM  

## 2020-06-19 DIAGNOSIS — M810 Age-related osteoporosis without current pathological fracture: Secondary | ICD-10-CM | POA: Diagnosis not present

## 2020-06-19 DIAGNOSIS — E119 Type 2 diabetes mellitus without complications: Secondary | ICD-10-CM | POA: Diagnosis not present

## 2020-06-19 DIAGNOSIS — N1831 Chronic kidney disease, stage 3a: Secondary | ICD-10-CM | POA: Diagnosis not present

## 2020-06-19 DIAGNOSIS — E785 Hyperlipidemia, unspecified: Secondary | ICD-10-CM | POA: Diagnosis not present

## 2020-06-19 DIAGNOSIS — E1169 Type 2 diabetes mellitus with other specified complication: Secondary | ICD-10-CM | POA: Diagnosis not present

## 2020-06-19 DIAGNOSIS — I10 Essential (primary) hypertension: Secondary | ICD-10-CM | POA: Diagnosis not present

## 2020-07-28 ENCOUNTER — Encounter: Payer: Self-pay | Admitting: Hematology and Oncology

## 2020-08-08 DIAGNOSIS — C44629 Squamous cell carcinoma of skin of left upper limb, including shoulder: Secondary | ICD-10-CM | POA: Diagnosis not present

## 2020-08-08 DIAGNOSIS — Z85828 Personal history of other malignant neoplasm of skin: Secondary | ICD-10-CM | POA: Diagnosis not present

## 2020-08-08 DIAGNOSIS — D485 Neoplasm of uncertain behavior of skin: Secondary | ICD-10-CM | POA: Diagnosis not present

## 2020-08-08 DIAGNOSIS — L905 Scar conditions and fibrosis of skin: Secondary | ICD-10-CM | POA: Diagnosis not present

## 2020-08-09 ENCOUNTER — Other Ambulatory Visit (HOSPITAL_BASED_OUTPATIENT_CLINIC_OR_DEPARTMENT_OTHER): Payer: Self-pay | Admitting: Family Medicine

## 2020-08-09 DIAGNOSIS — Z1231 Encounter for screening mammogram for malignant neoplasm of breast: Secondary | ICD-10-CM

## 2020-08-14 DIAGNOSIS — Z0000 Encounter for general adult medical examination without abnormal findings: Secondary | ICD-10-CM | POA: Diagnosis not present

## 2020-08-14 DIAGNOSIS — M810 Age-related osteoporosis without current pathological fracture: Secondary | ICD-10-CM | POA: Diagnosis not present

## 2020-08-14 DIAGNOSIS — Z8579 Personal history of other malignant neoplasms of lymphoid, hematopoietic and related tissues: Secondary | ICD-10-CM | POA: Diagnosis not present

## 2020-08-14 DIAGNOSIS — E1169 Type 2 diabetes mellitus with other specified complication: Secondary | ICD-10-CM | POA: Diagnosis not present

## 2020-08-14 DIAGNOSIS — I10 Essential (primary) hypertension: Secondary | ICD-10-CM | POA: Diagnosis not present

## 2020-08-16 DIAGNOSIS — H35033 Hypertensive retinopathy, bilateral: Secondary | ICD-10-CM | POA: Diagnosis not present

## 2020-08-16 DIAGNOSIS — E119 Type 2 diabetes mellitus without complications: Secondary | ICD-10-CM | POA: Diagnosis not present

## 2020-08-16 DIAGNOSIS — H353131 Nonexudative age-related macular degeneration, bilateral, early dry stage: Secondary | ICD-10-CM | POA: Diagnosis not present

## 2020-08-16 DIAGNOSIS — H26493 Other secondary cataract, bilateral: Secondary | ICD-10-CM | POA: Diagnosis not present

## 2020-08-24 ENCOUNTER — Telehealth: Payer: Self-pay | Admitting: Hematology and Oncology

## 2020-08-24 NOTE — Telephone Encounter (Signed)
R/s from 8/16. Called and spoke with pt confirmed 8/18 appt

## 2020-08-28 ENCOUNTER — Ambulatory Visit: Payer: Medicare Other | Admitting: Podiatry

## 2020-08-28 ENCOUNTER — Other Ambulatory Visit: Payer: Self-pay

## 2020-08-28 ENCOUNTER — Encounter: Payer: Self-pay | Admitting: Podiatry

## 2020-08-28 DIAGNOSIS — B351 Tinea unguium: Secondary | ICD-10-CM | POA: Diagnosis not present

## 2020-08-28 DIAGNOSIS — N183 Chronic kidney disease, stage 3 unspecified: Secondary | ICD-10-CM | POA: Diagnosis not present

## 2020-08-28 DIAGNOSIS — M79675 Pain in left toe(s): Secondary | ICD-10-CM

## 2020-08-28 DIAGNOSIS — M79674 Pain in right toe(s): Secondary | ICD-10-CM

## 2020-08-28 DIAGNOSIS — E1121 Type 2 diabetes mellitus with diabetic nephropathy: Secondary | ICD-10-CM | POA: Diagnosis not present

## 2020-08-28 NOTE — Progress Notes (Signed)
This patient returns to my office for at risk foot care.  This patient requires this care by a professional since this patient will be at risk due to having chronic kidney disease and type 2 diabetes.  This patient is unable to cut nails herself since the patient cannot reach her nails.These nails are painful walking and wearing shoes.  This patient presents for at risk foot care today.  General Appearance  Alert, conversant and in no acute stress.  Vascular  Dorsalis pedis and posterior tibial  pulses are palpable  bilaterally.  Capillary return is within normal limits  bilaterally. Temperature is within normal limits  bilaterally.  Neurologic  Senn-Weinstein monofilament wire test within normal limits  bilaterally. Muscle power within normal limits bilaterally.  Nails Thick disfigured discolored nails with subungual debris  from hallux to fifth toes bilaterally. No evidence of bacterial infection or drainage bilaterally.  Orthopedic  No limitations of motion  feet .  No crepitus or effusions noted.  No bony pathology or digital deformities noted.  HAV  B/L.  Skin  normotropic skin with no porokeratosis noted bilaterally.  No signs of infections or ulcers noted.     Onychomycosis  Pain in right toes  Pain in left toes  Consent was obtained for treatment procedures.   Mechanical debridement of nails 1-5  bilaterally performed with a nail nipper.  Filed with dremel without incident.    Return office visit   10 weeks                   Told patient to return for periodic foot care and evaluation due to potential at risk complications.   Amana Bouska DPM  

## 2020-09-04 ENCOUNTER — Ambulatory Visit (HOSPITAL_BASED_OUTPATIENT_CLINIC_OR_DEPARTMENT_OTHER)
Admission: RE | Admit: 2020-09-04 | Discharge: 2020-09-04 | Disposition: A | Discharge status: Home / Self Care | Payer: Medicare Other | Source: Ambulatory Visit | Attending: Family Medicine | Admitting: Family Medicine

## 2020-09-04 ENCOUNTER — Encounter (HOSPITAL_BASED_OUTPATIENT_CLINIC_OR_DEPARTMENT_OTHER): Payer: Self-pay

## 2020-09-04 ENCOUNTER — Other Ambulatory Visit: Payer: Self-pay

## 2020-09-04 DIAGNOSIS — Z1231 Encounter for screening mammogram for malignant neoplasm of breast: Secondary | ICD-10-CM | POA: Insufficient documentation

## 2020-09-12 ENCOUNTER — Ambulatory Visit: Payer: Medicare Other | Admitting: Hematology and Oncology

## 2020-09-12 ENCOUNTER — Other Ambulatory Visit: Payer: Medicare Other

## 2020-09-13 DIAGNOSIS — D0462 Carcinoma in situ of skin of left upper limb, including shoulder: Secondary | ICD-10-CM | POA: Diagnosis not present

## 2020-09-14 ENCOUNTER — Inpatient Hospital Stay: Payer: Medicare Other | Admitting: Hematology and Oncology

## 2020-09-14 ENCOUNTER — Encounter: Payer: Self-pay | Admitting: Hematology and Oncology

## 2020-09-14 ENCOUNTER — Other Ambulatory Visit: Payer: Self-pay

## 2020-09-14 ENCOUNTER — Inpatient Hospital Stay: Payer: Medicare Other | Attending: Hematology and Oncology

## 2020-09-14 DIAGNOSIS — N183 Chronic kidney disease, stage 3 unspecified: Secondary | ICD-10-CM | POA: Insufficient documentation

## 2020-09-14 DIAGNOSIS — C8580 Other specified types of non-Hodgkin lymphoma, unspecified site: Secondary | ICD-10-CM | POA: Diagnosis not present

## 2020-09-14 DIAGNOSIS — I10 Essential (primary) hypertension: Secondary | ICD-10-CM | POA: Diagnosis not present

## 2020-09-14 DIAGNOSIS — D631 Anemia in chronic kidney disease: Secondary | ICD-10-CM | POA: Insufficient documentation

## 2020-09-14 DIAGNOSIS — D630 Anemia in neoplastic disease: Secondary | ICD-10-CM | POA: Diagnosis not present

## 2020-09-14 DIAGNOSIS — Z79899 Other long term (current) drug therapy: Secondary | ICD-10-CM | POA: Insufficient documentation

## 2020-09-14 LAB — CBC WITH DIFFERENTIAL/PLATELET
Abs Immature Granulocytes: 0.02 10*3/uL (ref 0.00–0.07)
Basophils Absolute: 0 10*3/uL (ref 0.0–0.1)
Basophils Relative: 1 %
Eosinophils Absolute: 0.1 10*3/uL (ref 0.0–0.5)
Eosinophils Relative: 3 %
HCT: 32.2 % — ABNORMAL LOW (ref 36.0–46.0)
Hemoglobin: 10.8 g/dL — ABNORMAL LOW (ref 12.0–15.0)
Immature Granulocytes: 0 %
Lymphocytes Relative: 20 %
Lymphs Abs: 1 10*3/uL (ref 0.7–4.0)
MCH: 28.8 pg (ref 26.0–34.0)
MCHC: 33.5 g/dL (ref 30.0–36.0)
MCV: 85.9 fL (ref 80.0–100.0)
Monocytes Absolute: 0.4 10*3/uL (ref 0.1–1.0)
Monocytes Relative: 9 %
Neutro Abs: 3.1 10*3/uL (ref 1.7–7.7)
Neutrophils Relative %: 67 %
Platelets: 221 10*3/uL (ref 150–400)
RBC: 3.75 MIL/uL — ABNORMAL LOW (ref 3.87–5.11)
RDW: 13.6 % (ref 11.5–15.5)
WBC: 4.7 10*3/uL (ref 4.0–10.5)
nRBC: 0 % (ref 0.0–0.2)

## 2020-09-14 LAB — COMPREHENSIVE METABOLIC PANEL
ALT: 15 U/L (ref 0–44)
AST: 17 U/L (ref 15–41)
Albumin: 3.7 g/dL (ref 3.5–5.0)
Alkaline Phosphatase: 76 U/L (ref 38–126)
Anion gap: 10 (ref 5–15)
BUN: 22 mg/dL (ref 8–23)
CO2: 25 mmol/L (ref 22–32)
Calcium: 9.6 mg/dL (ref 8.9–10.3)
Chloride: 105 mmol/L (ref 98–111)
Creatinine, Ser: 1.3 mg/dL — ABNORMAL HIGH (ref 0.44–1.00)
GFR, Estimated: 40 mL/min — ABNORMAL LOW (ref >60)
Glucose, Bld: 115 mg/dL — ABNORMAL HIGH (ref 70–99)
Potassium: 4.2 mmol/L (ref 3.5–5.1)
Sodium: 140 mmol/L (ref 135–145)
Total Bilirubin: 0.5 mg/dL (ref 0.3–1.2)
Total Protein: 6.5 g/dL (ref 6.5–8.1)

## 2020-09-14 NOTE — Assessment & Plan Note (Signed)
Her kidney function tests is stable. Continue close monitoring and risk factor modification 

## 2020-09-14 NOTE — Progress Notes (Signed)
[facility] OFFICE PROGRESS NOTE  Patient Care Team: [name], Bill Salinas, MD as PCP - General (Family Medicine) Sharyne Peach, MD as Consulting Physician (Ophthalmology) Inda Castle, MD (Inactive) as Consulting Physician (Gastroenterology) Sypher, Herbie Baltimore, MD (Inactive) as Consulting Physician (Orthopedic Surgery) Jannette Spanner, MD as Referring Physician (Dermatology) Heath Lark, MD as Consulting Physician (Hematology and Oncology) Alphonsa Overall, MD as Consulting Physician (General Surgery)  ASSESSMENT & PLAN:  Marginal zone lymphoma Clinically, she has no signs or symptoms to suggest cancer recurrence I will see her again in a year for further follow-up The patient is educated to watch for signs and symptoms of cancer recurrence  Chronic kidney disease (CKD), stage III (moderate) Her kidney function tests is stable. Continue close monitoring and risk factor modification  Essential hypertension Her blood pressure intermittently elevated, likely due to stress and whitecoat hypertension She will continue her current blood pressure medications as directed by her primary care doctor  Anemia in neoplastic disease The most likely cause of her anemia is anemia chronic kidney disease.  Orders Placed This Encounter  Procedures   CBC with Differential/Platelet    Standing Status:   Standing    Number of Occurrences:   22    Standing Expiration Date:   09/14/2021   Comprehensive metabolic panel    Standing Status:   Standing    Number of Occurrences:   33    Standing Expiration Date:   09/14/2021    All questions were answered. The patient knows to call the clinic with any problems, questions or concerns. The total time spent in the appointment was 20 minutes encounter with patients including review of chart and various tests results, discussions about plan of care and coordination of care plan   Heath Lark, MD 09/14/2020 1:14 PM  INTERVAL HISTORY: Please see  below for problem oriented charting. She returns for further follow-up No recent infection Her energy level is fair No recent lymphadenopathy  SUMMARY OF ONCOLOGIC HISTORY: Oncology History Overview Note  Marginal zone lymphoma, with lymph node and bone marrow involvement along with splenomegaly   Primary site: Lymphoid Neoplasms   Staging method: AJCC 6th Edition   Clinical: Stage IV signed by Heath Lark, MD on 09/02/2013  7:38 PM   Summary: Stage IV     Marginal zone lymphoma ([place])  07/29/2013 Imaging   CT scan shows splenomegaly and abnormal lymphadenopathy.   08/13/2013 Imaging   PET CT scan confirmed lymphadenopathy and splenomegaly, those areas are PET avid   08/23/2013 Surgery   [id number] left axillary lymph node biopsy confirmed a low-grade lymphoma.   08/27/2013 Bone Marrow Biopsy   Bone marrow biopsy confirmed lymphoma.[id number]   09/08/2013 - 09/29/2013 Chemotherapy   She completed 4 cycles of rituximab with resolution of splenomegaly.   12/29/2013 Imaging   PET scan show complete response.   07/11/2014 Imaging   PET scan showed recurrent disease   07/19/2014 - 08/09/2014 Chemotherapy   She completed 4 cycles of rituximab with resolution of splenomegaly   10/12/2014 Imaging   Repeat PET/CT scan showed near complete response to treatment.   10/13/2014 - 02/09/2015 Chemotherapy   She is placed on rituximab maintenance therapy.   03/29/2015 Imaging   PET CT showed possible mild progression of splenomegaly   04/26/2015 - 02/22/2016 Chemotherapy   She started taking Ibrutinib   04/28/2015 Adverse Reaction   Treatment was placed temporarily on hold due to severe diarrhea and resumed at reduced dose   07/03/2015 PET scan  No hypermetabolic adenopathy in the neck, chest, abdomen or pelvis.2. Spleen is not hypermetabolic and appears slightly less prominent size.   02/06/2016 PET scan   Hypermetabolic LEFT axillary lymph nodes. Concern for HIGH-GRADE LYMPHOMA LOCALIZED  RECURRENCE. 2. Prominent spleen without hypermetabolic activity. No change in size from comparison. 3. No additional hypermetabolic adenopathy on scan.   02/19/2016 Procedure   Technically successful ultrasound-guided core biopsy, left axillary adenopathy.   02/19/2016 Pathology Results   Lymph node, needle/core biopsy, left axilla - ATYPICAL LYMPHOID PROLIFERATION SUSPICIOUS FOR LYMPHOMA. - SEE COMMENT. Microscopic Comment The sections show multiple very small and skinny fragments of lymph nodal tissue displaying areas of lymphoid expansion by a population of small to medium size lymphocytes with round to irregular nuclei, scanty to moderately abundant cytoplasm, dense chromatin and inconspicuous nucleoli. This is associated with scattering of "naked" germinal centers with abundant tingible body macrophages. To further evaluate this process, flow cytometric analysis was performed and shows a minor population of monoclonal B cells expressing pan B-cell antigens including CD20 with associated kappa light chain restriction. In addition, immunohistochemical stains were performed including CD20, CD79a, CD3, CD5, CD10, BCL-2, CD21 with appropriate controls. The stains show a mixture of T and B cells with slight predominance of B cells. CD21 highlights numerous areas with dendritic networks, correlating with the presence of germinal centers. CD10 shows focal positivity likely in germinal centers. BCL-2 is diffusely positive except in areas of apparent germinal centers. The overall features are atypical and suspicious for involvement by a B-cell lymphoproliferative process, particularly given the B-cell monoclonality by flow cytometry. However, morphologic evaluation is extremely limited due to small biopsy fragments and additional material (excisional biopsy) is strongly recommended for further evaluation. (BNS:ecj 02/21/2016)    03/04/2016 Procedure   Placement of single lumen port a cath via right internal  jugular vein. The catheter tip lies at the cavo-atrial junction. A power injectable port a cath was placed and is ready for immediate use.   03/07/2016 - 05/31/2016 Chemotherapy   She received treatment with bendamustine and Gazyva   05/27/2016 PET scan   1. No evidence of active lymphoma on whole-body scan. 2. Resolution of metabolic activity previously identified in the LEFT axilla. 3. Small amount free fluid the pelvis.   03/07/2017 PET scan   1. No findings of active lymphoma in the chest, abdomen, or pelvis. 2. Other imaging findings of potential clinical significance: Aortic Atherosclerosis (ICD10-I70.0). Coronary atherosclerosis. Chronic paranasal sinusitis. Moderately distended gallbladder, chronic   05/02/2020 Procedure   Removal of implanted Port-A-Cath utilizing sharp and blunt dissection. The procedure was uncomplicated.     REVIEW OF SYSTEMS:   Constitutional: Denies fevers, chills or abnormal weight loss Eyes: Denies blurriness of vision Ears, nose, mouth, throat, and face: Denies mucositis or sore throat Respiratory: Denies cough, dyspnea or wheezes Cardiovascular: Denies palpitation, chest discomfort or lower extremity swelling Gastrointestinal:  Denies nausea, heartburn or change in bowel habits Skin: Denies abnormal skin rashes Lymphatics: Denies new lymphadenopathy or easy bruising Neurological:Denies numbness, tingling or new weaknesses Behavioral/Psych: Mood is stable, no new changes  All other systems were reviewed with the patient and are negative.  I have reviewed the past medical history, past surgical history, social history and family history with the patient and they are unchanged from previous note.  ALLERGIES:  is allergic to aspirin, capoten [captopril], glyburide, insulin detemir, other, saxagliptin, statins, and zocor [simvastatin].  MEDICATIONS:  Current Outpatient Medications  Medication Sig Dispense Refill   Accu-Chek FastClix Lancets  MISC USE 1 TO  CHECK GLUCOSE ONCE DAILY     acyclovir (ZOVIRAX) 400 MG tablet Take 1 tablet by mouth once daily 30 tablet 11   alendronate (FOSAMAX) 70 MG tablet Take 70 mg by mouth once a week.     amLODipine (NORVASC) 10 MG tablet TAKE 1 TABLET BY MOUTH AT BEDTIME 90 tablet 0   aspirin 81 MG tablet Take 40.5 mg by mouth 3 (three) times a week.     Blood Glucose Monitoring Suppl (ACCU-CHEK GUIDE) w/Device KIT See admin instructions.     calcium carbonate (OSCAL) 1500 (600 Ca) MG TABS tablet 1 tablet with meals     Calcium Carbonate-Vit D-Min (CALCIUM 1200 PO) Take 1 tablet by mouth daily. Per patient takes in am and pm     cholecalciferol (VITAMIN D) 1000 UNITS tablet Take 1,000 Units by mouth daily.      enalapril (VASOTEC) 20 MG tablet Take 20 mg by mouth every morning.     fish oil-omega-3 fatty acids 1000 MG capsule Take 1 g by mouth daily.      fluorouracil (EFUDEX) 5 % cream APPLY CREAM TOPICALLY TO AFFECTED AREA TWICE DAILY FOR 14 DAYS     glimepiride (AMARYL) 4 MG tablet Take 4 mg by mouth daily with breakfast. Per patient takes in am and pm.     glucose blood test strip Use as instructed 100 each 12   lidocaine-prilocaine (EMLA) cream Apply 1 application topically as needed. 30 g 3   loperamide (IMODIUM) 2 MG capsule Take by mouth as needed for diarrhea or loose stools.     Magnesium 250 MG TABS 1 tablet with a meal     Magnesium 500 MG CAPS Take 500 mg by mouth daily.      metFORMIN (GLUCOPHAGE) 1000 MG tablet Take 1,000 mg by mouth 2 (two) times daily with a meal.      Misc Natural Products (LUTEIN 20 PO) Take by mouth daily.     Multiple Vitamins-Minerals (CENTRUM CARDIO) TABS Take 1 tablet by mouth daily.     Multiple Vitamins-Minerals (ICAPS) CAPS Take by mouth daily.     No current facility-administered medications for this visit.    PHYSICAL EXAMINATION: ECOG PERFORMANCE STATUS: 0 - Asymptomatic  Vitals:   09/14/20 1015  BP: (!) 167/60  Pulse: 72  Resp: 18  Temp: (!) 97.4 F (36.3  C)  SpO2: 99%   Filed Weights   09/14/20 1015  Weight: 162 lb 6.4 oz (73.7 kg)    GENERAL:alert, no distress and comfortable SKIN: skin color, texture, turgor are normal, no rashes or significant lesions EYES: normal, Conjunctiva are pink and non-injected, sclera clear OROPHARYNX:no exudate, no erythema and lips, buccal mucosa, and tongue normal  NECK: supple, thyroid normal size, non-tender, without nodularity LYMPH:  no palpable lymphadenopathy in the cervical, axillary or inguinal LUNGS: clear to auscultation and percussion with normal breathing effort HEART: regular rate & rhythm and no murmurs and no lower extremity edema ABDOMEN:abdomen soft, non-tender and normal bowel sounds Musculoskeletal:no cyanosis of digits and no clubbing  NEURO: alert & oriented x 3 with fluent speech, no focal motor/sensory deficits  LABORATORY DATA:  I have reviewed the data as listed    Component Value Date/Time   NA 140 09/14/2020 1004   NA 136 01/07/2017 1004   K 4.2 09/14/2020 1004   K 4.5 01/07/2017 1004   CL 105 09/14/2020 1004   CO2 25 09/14/2020 1004   CO2 23 01/07/2017 1004  GLUCOSE 115 (H) 09/14/2020 1004   GLUCOSE 217 (H) 01/07/2017 1004   BUN 22 09/14/2020 1004   BUN 23.3 01/07/2017 1004   CREATININE 1.30 (H) 09/14/2020 1004   CREATININE 1.37 (H) 07/06/2018 0959   CREATININE 1.2 (H) 01/07/2017 1004   CALCIUM 9.6 09/14/2020 1004   CALCIUM 9.4 01/07/2017 1004   PROT 6.5 09/14/2020 1004   PROT 6.7 01/07/2017 1004   ALBUMIN 3.7 09/14/2020 1004   ALBUMIN 3.9 01/07/2017 1004   AST 17 09/14/2020 1004   AST 15 07/06/2018 0959   AST 20 01/07/2017 1004   ALT 15 09/14/2020 1004   ALT 10 07/06/2018 0959   ALT 14 01/07/2017 1004   ALKPHOS 76 09/14/2020 1004   ALKPHOS 70 01/07/2017 1004   BILITOT 0.5 09/14/2020 1004   BILITOT 0.4 07/06/2018 0959   BILITOT 0.49 01/07/2017 1004   GFRNONAA 40 (L) 09/14/2020 1004   GFRNONAA 36 (L) 07/06/2018 0959   GFRNONAA 47 (L) 05/18/2013 1256    GFRAA 40 (L) 09/28/2019 1053   GFRAA 41 (L) 07/06/2018 0959   GFRAA 55 (L) 05/18/2013 1256    No results found for: SPEP, UPEP  Lab Results  Component Value Date   WBC 4.7 09/14/2020   NEUTROABS 3.1 09/14/2020   HGB 10.8 (L) 09/14/2020   HCT 32.2 (L) 09/14/2020   MCV 85.9 09/14/2020   PLT 221 09/14/2020      Chemistry      Component Value Date/Time   NA 140 09/14/2020 1004   NA 136 01/07/2017 1004   K 4.2 09/14/2020 1004   K 4.5 01/07/2017 1004   CL 105 09/14/2020 1004   CO2 25 09/14/2020 1004   CO2 23 01/07/2017 1004   BUN 22 09/14/2020 1004   BUN 23.3 01/07/2017 1004   CREATININE 1.30 (H) 09/14/2020 1004   CREATININE 1.37 (H) 07/06/2018 0959   CREATININE 1.2 (H) 01/07/2017 1004   GLU 202 (H) 08/09/2014 1000      Component Value Date/Time   CALCIUM 9.6 09/14/2020 1004   CALCIUM 9.4 01/07/2017 1004   ALKPHOS 76 09/14/2020 1004   ALKPHOS 70 01/07/2017 1004   AST 17 09/14/2020 1004   AST 15 07/06/2018 0959   AST 20 01/07/2017 1004   ALT 15 09/14/2020 1004   ALT 10 07/06/2018 0959   ALT 14 01/07/2017 1004   BILITOT 0.5 09/14/2020 1004   BILITOT 0.4 07/06/2018 0959   BILITOT 0.49 01/07/2017 1004       RADIOGRAPHIC STUDIES: I have personally reviewed the radiological images as listed and agreed with the findings in the report. MM 3D SCREEN BREAST BILATERAL  Result Date: 09/06/2020 CLINICAL DATA:  Screening. EXAM: DIGITAL SCREENING BILATERAL MAMMOGRAM WITH TOMOSYNTHESIS AND CAD TECHNIQUE: Bilateral screening digital craniocaudal and mediolateral oblique mammograms were obtained. Bilateral screening digital breast tomosynthesis was performed. The images were evaluated with computer-aided detection. COMPARISON:  Previous exam(s). ACR Breast Density Category b: There are scattered areas of fibroglandular density. FINDINGS: There are no findings suspicious for malignancy. IMPRESSION: No mammographic evidence of malignancy. A result letter of this screening mammogram  will be mailed directly to the patient. RECOMMENDATION: Screening mammogram in one year. (Code:[id number]-B-01Y) BI-RADS CATEGORY  1: Negative. Electronically Signed   By: Claudie Revering M.D.   On: 09/06/2020 15:49

## 2020-09-14 NOTE — Assessment & Plan Note (Signed)
Clinically, she has no signs or symptoms to suggest cancer recurrence I will see her again in a year for further follow-up The patient is educated to watch for signs and symptoms of cancer recurrence

## 2020-09-14 NOTE — Assessment & Plan Note (Signed)
The most likely cause of her anemia is anemia chronic kidney disease.

## 2020-09-14 NOTE — Assessment & Plan Note (Signed)
Her blood pressure intermittently elevated, likely due to stress and whitecoat hypertension She will continue her current blood pressure medications as directed by her primary care doctor

## 2020-09-26 DIAGNOSIS — D0462 Carcinoma in situ of skin of left upper limb, including shoulder: Secondary | ICD-10-CM | POA: Diagnosis not present

## 2020-10-31 ENCOUNTER — Other Ambulatory Visit: Payer: Self-pay

## 2020-10-31 ENCOUNTER — Ambulatory Visit: Payer: Medicare Other | Admitting: Podiatry

## 2020-10-31 ENCOUNTER — Encounter: Payer: Self-pay | Admitting: Podiatry

## 2020-10-31 DIAGNOSIS — M79674 Pain in right toe(s): Secondary | ICD-10-CM | POA: Diagnosis not present

## 2020-10-31 DIAGNOSIS — M79675 Pain in left toe(s): Secondary | ICD-10-CM

## 2020-10-31 DIAGNOSIS — E1121 Type 2 diabetes mellitus with diabetic nephropathy: Secondary | ICD-10-CM

## 2020-10-31 DIAGNOSIS — B351 Tinea unguium: Secondary | ICD-10-CM | POA: Diagnosis not present

## 2020-10-31 DIAGNOSIS — N183 Chronic kidney disease, stage 3 unspecified: Secondary | ICD-10-CM

## 2020-10-31 NOTE — Progress Notes (Signed)
This patient returns to my office for at risk foot care.  This patient requires this care by a professional since this patient will be at risk due to having chronic kidney disease and type 2 diabetes.  This patient is unable to cut nails herself since the patient cannot reach her nails.These nails are painful walking and wearing shoes.  This patient presents for at risk foot care today.  General Appearance  Alert, conversant and in no acute stress.  Vascular  Dorsalis pedis and posterior tibial  pulses are palpable  bilaterally.  Capillary return is within normal limits  bilaterally. Temperature is within normal limits  bilaterally.  Neurologic  Senn-Weinstein monofilament wire test within normal limits  bilaterally. Muscle power within normal limits bilaterally.  Nails Thick disfigured discolored nails with subungual debris  from hallux to fifth toes bilaterally. No evidence of bacterial infection or drainage bilaterally.  Orthopedic  No limitations of motion  feet .  No crepitus or effusions noted.  No bony pathology or digital deformities noted.  HAV  B/L.  Skin  normotropic skin with no porokeratosis noted bilaterally.  No signs of infections or ulcers noted.     Onychomycosis  Pain in right toes  Pain in left toes  Consent was obtained for treatment procedures.   Mechanical debridement of nails 1-5  bilaterally performed with a nail nipper.  Filed with dremel without incident.    Return office visit   10 weeks                   Told patient to return for periodic foot care and evaluation due to potential at risk complications.   Vinay Ertl DPM  

## 2020-12-20 DIAGNOSIS — E785 Hyperlipidemia, unspecified: Secondary | ICD-10-CM | POA: Diagnosis not present

## 2020-12-20 DIAGNOSIS — I10 Essential (primary) hypertension: Secondary | ICD-10-CM | POA: Diagnosis not present

## 2020-12-20 DIAGNOSIS — M810 Age-related osteoporosis without current pathological fracture: Secondary | ICD-10-CM | POA: Diagnosis not present

## 2020-12-20 DIAGNOSIS — E119 Type 2 diabetes mellitus without complications: Secondary | ICD-10-CM | POA: Diagnosis not present

## 2020-12-20 DIAGNOSIS — N1831 Chronic kidney disease, stage 3a: Secondary | ICD-10-CM | POA: Diagnosis not present

## 2020-12-20 DIAGNOSIS — E1169 Type 2 diabetes mellitus with other specified complication: Secondary | ICD-10-CM | POA: Diagnosis not present

## 2021-01-09 ENCOUNTER — Ambulatory Visit: Payer: Medicare Other | Admitting: Podiatry

## 2021-01-10 ENCOUNTER — Ambulatory Visit: Payer: Medicare Other | Admitting: Podiatry

## 2021-01-10 ENCOUNTER — Encounter: Payer: Self-pay | Admitting: Podiatry

## 2021-01-10 ENCOUNTER — Other Ambulatory Visit: Payer: Self-pay

## 2021-01-10 DIAGNOSIS — M79675 Pain in left toe(s): Secondary | ICD-10-CM

## 2021-01-10 DIAGNOSIS — B351 Tinea unguium: Secondary | ICD-10-CM | POA: Diagnosis not present

## 2021-01-10 DIAGNOSIS — N183 Chronic kidney disease, stage 3 unspecified: Secondary | ICD-10-CM | POA: Diagnosis not present

## 2021-01-10 DIAGNOSIS — E1121 Type 2 diabetes mellitus with diabetic nephropathy: Secondary | ICD-10-CM | POA: Diagnosis not present

## 2021-01-10 DIAGNOSIS — M79674 Pain in right toe(s): Secondary | ICD-10-CM | POA: Diagnosis not present

## 2021-01-10 NOTE — Progress Notes (Signed)
This patient returns to my office for at risk foot care.  This patient requires this care by a professional since this patient will be at risk due to having chronic kidney disease and type 2 diabetes.  This patient is unable to cut nails herself since the patient cannot reach her nails.These nails are painful walking and wearing shoes.  This patient presents for at risk foot care today.  General Appearance  Alert, conversant and in no acute stress.  Vascular  Dorsalis pedis and posterior tibial  pulses are palpable  bilaterally.  Capillary return is within normal limits  bilaterally. Temperature is within normal limits  bilaterally.  Neurologic  Senn-Weinstein monofilament wire test within normal limits  bilaterally. Muscle power within normal limits bilaterally.  Nails Thick disfigured discolored nails with subungual debris  from hallux to fifth toes bilaterally. No evidence of bacterial infection or drainage bilaterally.  Orthopedic  No limitations of motion  feet .  No crepitus or effusions noted.  No bony pathology or digital deformities noted.  HAV  B/L.  Skin  normotropic skin with no porokeratosis noted bilaterally.  No signs of infections or ulcers noted.     Onychomycosis  Pain in right toes  Pain in left toes  Consent was obtained for treatment procedures.   Mechanical debridement of nails 1-5  bilaterally performed with a nail nipper.  Filed with dremel without incident.    Return office visit   10 weeks                   Told patient to return for periodic foot care and evaluation due to potential at risk complications.   Lorenda Peck DPM

## 2021-02-08 DIAGNOSIS — R229 Localized swelling, mass and lump, unspecified: Secondary | ICD-10-CM | POA: Diagnosis not present

## 2021-02-08 DIAGNOSIS — L570 Actinic keratosis: Secondary | ICD-10-CM | POA: Diagnosis not present

## 2021-02-08 DIAGNOSIS — D485 Neoplasm of uncertain behavior of skin: Secondary | ICD-10-CM | POA: Diagnosis not present

## 2021-02-08 DIAGNOSIS — C44629 Squamous cell carcinoma of skin of left upper limb, including shoulder: Secondary | ICD-10-CM | POA: Diagnosis not present

## 2021-02-08 DIAGNOSIS — Z08 Encounter for follow-up examination after completed treatment for malignant neoplasm: Secondary | ICD-10-CM | POA: Diagnosis not present

## 2021-02-08 DIAGNOSIS — D1801 Hemangioma of skin and subcutaneous tissue: Secondary | ICD-10-CM | POA: Diagnosis not present

## 2021-02-08 DIAGNOSIS — Z85828 Personal history of other malignant neoplasm of skin: Secondary | ICD-10-CM | POA: Diagnosis not present

## 2021-02-08 DIAGNOSIS — C44729 Squamous cell carcinoma of skin of left lower limb, including hip: Secondary | ICD-10-CM | POA: Diagnosis not present

## 2021-02-08 DIAGNOSIS — L578 Other skin changes due to chronic exposure to nonionizing radiation: Secondary | ICD-10-CM | POA: Diagnosis not present

## 2021-02-08 DIAGNOSIS — C44622 Squamous cell carcinoma of skin of right upper limb, including shoulder: Secondary | ICD-10-CM | POA: Diagnosis not present

## 2021-02-13 DIAGNOSIS — Z8579 Personal history of other malignant neoplasms of lymphoid, hematopoietic and related tissues: Secondary | ICD-10-CM | POA: Diagnosis not present

## 2021-02-13 DIAGNOSIS — I10 Essential (primary) hypertension: Secondary | ICD-10-CM | POA: Diagnosis not present

## 2021-02-13 DIAGNOSIS — E1169 Type 2 diabetes mellitus with other specified complication: Secondary | ICD-10-CM | POA: Diagnosis not present

## 2021-02-13 DIAGNOSIS — M810 Age-related osteoporosis without current pathological fracture: Secondary | ICD-10-CM | POA: Diagnosis not present

## 2021-02-16 DIAGNOSIS — Z23 Encounter for immunization: Secondary | ICD-10-CM | POA: Diagnosis not present

## 2021-03-15 DIAGNOSIS — D0462 Carcinoma in situ of skin of left upper limb, including shoulder: Secondary | ICD-10-CM | POA: Diagnosis not present

## 2021-03-15 DIAGNOSIS — D0461 Carcinoma in situ of skin of right upper limb, including shoulder: Secondary | ICD-10-CM | POA: Diagnosis not present

## 2021-03-15 DIAGNOSIS — L905 Scar conditions and fibrosis of skin: Secondary | ICD-10-CM | POA: Diagnosis not present

## 2021-03-29 DIAGNOSIS — L905 Scar conditions and fibrosis of skin: Secondary | ICD-10-CM | POA: Diagnosis not present

## 2021-03-29 DIAGNOSIS — D0462 Carcinoma in situ of skin of left upper limb, including shoulder: Secondary | ICD-10-CM | POA: Diagnosis not present

## 2021-03-29 DIAGNOSIS — D0461 Carcinoma in situ of skin of right upper limb, including shoulder: Secondary | ICD-10-CM | POA: Diagnosis not present

## 2021-03-29 DIAGNOSIS — D485 Neoplasm of uncertain behavior of skin: Secondary | ICD-10-CM | POA: Diagnosis not present

## 2021-04-10 ENCOUNTER — Ambulatory Visit: Payer: Medicare Other | Admitting: Podiatry

## 2021-04-10 ENCOUNTER — Other Ambulatory Visit: Payer: Self-pay

## 2021-04-10 ENCOUNTER — Encounter: Payer: Self-pay | Admitting: Podiatry

## 2021-04-10 DIAGNOSIS — E1121 Type 2 diabetes mellitus with diabetic nephropathy: Secondary | ICD-10-CM | POA: Diagnosis not present

## 2021-04-10 DIAGNOSIS — M79674 Pain in right toe(s): Secondary | ICD-10-CM

## 2021-04-10 DIAGNOSIS — M79675 Pain in left toe(s): Secondary | ICD-10-CM | POA: Diagnosis not present

## 2021-04-10 DIAGNOSIS — B351 Tinea unguium: Secondary | ICD-10-CM

## 2021-04-10 DIAGNOSIS — N183 Chronic kidney disease, stage 3 unspecified: Secondary | ICD-10-CM | POA: Diagnosis not present

## 2021-04-10 NOTE — Progress Notes (Signed)
This patient returns to my office for at risk foot care.  This patient requires this care by a professional since this patient will be at risk due to having chronic kidney disease and type 2 diabetes.  This patient is unable to cut nails herself since the patient cannot reach her nails.These nails are painful walking and wearing shoes.  This patient presents for at risk foot care today.  General Appearance  Alert, conversant and in no acute stress.  Vascular  Dorsalis pedis and posterior tibial  pulses are palpable  bilaterally.  Capillary return is within normal limits  bilaterally. Temperature is within normal limits  bilaterally.  Neurologic  Senn-Weinstein monofilament wire test within normal limits  bilaterally. Muscle power within normal limits bilaterally.  Nails Thick disfigured discolored nails with subungual debris  from hallux to fifth toes bilaterally. No evidence of bacterial infection or drainage bilaterally.  Orthopedic  No limitations of motion  feet .  No crepitus or effusions noted.  No bony pathology or digital deformities noted.  HAV  B/L.  Skin  normotropic skin with no porokeratosis noted bilaterally.  No signs of infections or ulcers noted.     Onychomycosis  Pain in right toes  Pain in left toes  Consent was obtained for treatment procedures.   Mechanical debridement of nails 1-5  bilaterally performed with a nail nipper.  Filed with dremel without incident.    Return office visit   10 weeks                   Told patient to return for periodic foot care and evaluation due to potential at risk complications.   Sharra Cayabyab DPM  

## 2021-04-12 DIAGNOSIS — D0461 Carcinoma in situ of skin of right upper limb, including shoulder: Secondary | ICD-10-CM | POA: Diagnosis not present

## 2021-04-12 DIAGNOSIS — D485 Neoplasm of uncertain behavior of skin: Secondary | ICD-10-CM | POA: Diagnosis not present

## 2021-04-12 DIAGNOSIS — C44622 Squamous cell carcinoma of skin of right upper limb, including shoulder: Secondary | ICD-10-CM | POA: Diagnosis not present

## 2021-04-12 DIAGNOSIS — C44629 Squamous cell carcinoma of skin of left upper limb, including shoulder: Secondary | ICD-10-CM | POA: Diagnosis not present

## 2021-04-12 DIAGNOSIS — L905 Scar conditions and fibrosis of skin: Secondary | ICD-10-CM | POA: Diagnosis not present

## 2021-04-25 DIAGNOSIS — E1169 Type 2 diabetes mellitus with other specified complication: Secondary | ICD-10-CM | POA: Diagnosis not present

## 2021-04-25 DIAGNOSIS — M810 Age-related osteoporosis without current pathological fracture: Secondary | ICD-10-CM | POA: Diagnosis not present

## 2021-04-25 DIAGNOSIS — I10 Essential (primary) hypertension: Secondary | ICD-10-CM | POA: Diagnosis not present

## 2021-05-22 DIAGNOSIS — M810 Age-related osteoporosis without current pathological fracture: Secondary | ICD-10-CM | POA: Diagnosis not present

## 2021-05-22 DIAGNOSIS — N1831 Chronic kidney disease, stage 3a: Secondary | ICD-10-CM | POA: Diagnosis not present

## 2021-05-22 DIAGNOSIS — I10 Essential (primary) hypertension: Secondary | ICD-10-CM | POA: Diagnosis not present

## 2021-05-22 DIAGNOSIS — E785 Hyperlipidemia, unspecified: Secondary | ICD-10-CM | POA: Diagnosis not present

## 2021-05-22 DIAGNOSIS — E1169 Type 2 diabetes mellitus with other specified complication: Secondary | ICD-10-CM | POA: Diagnosis not present

## 2021-05-30 DIAGNOSIS — C44629 Squamous cell carcinoma of skin of left upper limb, including shoulder: Secondary | ICD-10-CM | POA: Diagnosis not present

## 2021-05-30 DIAGNOSIS — C44622 Squamous cell carcinoma of skin of right upper limb, including shoulder: Secondary | ICD-10-CM | POA: Diagnosis not present

## 2021-06-11 DIAGNOSIS — C44629 Squamous cell carcinoma of skin of left upper limb, including shoulder: Secondary | ICD-10-CM | POA: Diagnosis not present

## 2021-07-11 ENCOUNTER — Encounter: Payer: Self-pay | Admitting: Podiatry

## 2021-07-11 ENCOUNTER — Ambulatory Visit: Payer: Medicare Other | Admitting: Podiatry

## 2021-07-11 DIAGNOSIS — E1121 Type 2 diabetes mellitus with diabetic nephropathy: Secondary | ICD-10-CM

## 2021-07-11 DIAGNOSIS — M79674 Pain in right toe(s): Secondary | ICD-10-CM

## 2021-07-11 DIAGNOSIS — N183 Chronic kidney disease, stage 3 unspecified: Secondary | ICD-10-CM | POA: Diagnosis not present

## 2021-07-11 DIAGNOSIS — M79675 Pain in left toe(s): Secondary | ICD-10-CM

## 2021-07-11 DIAGNOSIS — B351 Tinea unguium: Secondary | ICD-10-CM | POA: Diagnosis not present

## 2021-07-11 NOTE — Progress Notes (Signed)
This patient returns to my office for at risk foot care.  This patient requires this care by a professional since this patient will be at risk due to having chronic kidney disease and type 2 diabetes.  This patient is unable to cut nails herself since the patient cannot reach her nails.These nails are painful walking and wearing shoes.  This patient presents for at risk foot care today.  General Appearance  Alert, conversant and in no acute stress.  Vascular  Dorsalis pedis and posterior tibial  pulses are palpable  bilaterally.  Capillary return is within normal limits  bilaterally. Temperature is within normal limits  bilaterally.  Neurologic  Senn-Weinstein monofilament wire test within normal limits  bilaterally. Muscle power within normal limits bilaterally.  Nails Thick disfigured discolored nails with subungual debris  from hallux to fifth toes bilaterally. No evidence of bacterial infection or drainage bilaterally.  Orthopedic  No limitations of motion  feet .  No crepitus or effusions noted.  No bony pathology or digital deformities noted.  HAV  B/L.  Skin  normotropic skin with no porokeratosis noted bilaterally.  No signs of infections or ulcers noted.     Onychomycosis  Pain in right toes  Pain in left toes  Consent was obtained for treatment procedures.   Mechanical debridement of nails 1-5  bilaterally performed with a nail nipper.  Filed with dremel without incident.    Return office visit   10 weeks                   Told patient to return for periodic foot care and evaluation due to potential at risk complications.   [name] DPM  

## 2021-08-20 DIAGNOSIS — Z760 Encounter for issue of repeat prescription: Secondary | ICD-10-CM | POA: Diagnosis not present

## 2021-08-22 DIAGNOSIS — H353131 Nonexudative age-related macular degeneration, bilateral, early dry stage: Secondary | ICD-10-CM | POA: Diagnosis not present

## 2021-08-22 DIAGNOSIS — E119 Type 2 diabetes mellitus without complications: Secondary | ICD-10-CM | POA: Diagnosis not present

## 2021-08-22 DIAGNOSIS — H26493 Other secondary cataract, bilateral: Secondary | ICD-10-CM | POA: Diagnosis not present

## 2021-08-22 DIAGNOSIS — Z961 Presence of intraocular lens: Secondary | ICD-10-CM | POA: Diagnosis not present

## 2021-09-14 ENCOUNTER — Other Ambulatory Visit: Payer: Self-pay

## 2021-09-14 ENCOUNTER — Inpatient Hospital Stay: Payer: Medicare Other | Admitting: Hematology and Oncology

## 2021-09-14 ENCOUNTER — Encounter: Payer: Self-pay | Admitting: Hematology and Oncology

## 2021-09-14 ENCOUNTER — Inpatient Hospital Stay: Payer: Medicare Other | Attending: Hematology and Oncology

## 2021-09-14 VITALS — BP 159/69 | HR 83 | Temp 99.3°F | Resp 18 | Ht 64.0 in | Wt 150.6 lb

## 2021-09-14 DIAGNOSIS — Z7984 Long term (current) use of oral hypoglycemic drugs: Secondary | ICD-10-CM | POA: Insufficient documentation

## 2021-09-14 DIAGNOSIS — R61 Generalized hyperhidrosis: Secondary | ICD-10-CM | POA: Diagnosis not present

## 2021-09-14 DIAGNOSIS — E1122 Type 2 diabetes mellitus with diabetic chronic kidney disease: Secondary | ICD-10-CM | POA: Insufficient documentation

## 2021-09-14 DIAGNOSIS — C8580 Other specified types of non-Hodgkin lymphoma, unspecified site: Secondary | ICD-10-CM | POA: Diagnosis not present

## 2021-09-14 DIAGNOSIS — D630 Anemia in neoplastic disease: Secondary | ICD-10-CM

## 2021-09-14 DIAGNOSIS — Z79899 Other long term (current) drug therapy: Secondary | ICD-10-CM | POA: Insufficient documentation

## 2021-09-14 DIAGNOSIS — Z79620 Long term (current) use of immunosuppressive biologic: Secondary | ICD-10-CM | POA: Diagnosis not present

## 2021-09-14 DIAGNOSIS — E1121 Type 2 diabetes mellitus with diabetic nephropathy: Secondary | ICD-10-CM | POA: Diagnosis not present

## 2021-09-14 DIAGNOSIS — D649 Anemia, unspecified: Secondary | ICD-10-CM | POA: Insufficient documentation

## 2021-09-14 DIAGNOSIS — E1165 Type 2 diabetes mellitus with hyperglycemia: Secondary | ICD-10-CM | POA: Diagnosis not present

## 2021-09-14 DIAGNOSIS — Z8572 Personal history of non-Hodgkin lymphomas: Secondary | ICD-10-CM | POA: Insufficient documentation

## 2021-09-14 LAB — CBC WITH DIFFERENTIAL/PLATELET
Abs Immature Granulocytes: 0.04 10*3/uL (ref 0.00–0.07)
Basophils Absolute: 0.1 10*3/uL (ref 0.0–0.1)
Basophils Relative: 1 %
Eosinophils Absolute: 0.2 10*3/uL (ref 0.0–0.5)
Eosinophils Relative: 3 %
HCT: 29.1 % — ABNORMAL LOW (ref 36.0–46.0)
Hemoglobin: 9.5 g/dL — ABNORMAL LOW (ref 12.0–15.0)
Immature Granulocytes: 1 %
Lymphocytes Relative: 18 %
Lymphs Abs: 1.3 10*3/uL (ref 0.7–4.0)
MCH: 26 pg (ref 26.0–34.0)
MCHC: 32.6 g/dL (ref 30.0–36.0)
MCV: 79.5 fL — ABNORMAL LOW (ref 80.0–100.0)
Monocytes Absolute: 0.5 10*3/uL (ref 0.1–1.0)
Monocytes Relative: 7 %
Neutro Abs: 5.1 10*3/uL (ref 1.7–7.7)
Neutrophils Relative %: 70 %
Platelets: 275 10*3/uL (ref 150–400)
RBC: 3.66 MIL/uL — ABNORMAL LOW (ref 3.87–5.11)
RDW: 15.9 % — ABNORMAL HIGH (ref 11.5–15.5)
WBC: 7.2 10*3/uL (ref 4.0–10.5)
nRBC: 0 % (ref 0.0–0.2)

## 2021-09-14 LAB — COMPREHENSIVE METABOLIC PANEL
ALT: 15 U/L (ref 0–44)
AST: 13 U/L — ABNORMAL LOW (ref 15–41)
Albumin: 3.7 g/dL (ref 3.5–5.0)
Alkaline Phosphatase: 119 U/L (ref 38–126)
Anion gap: 7 (ref 5–15)
BUN: 17 mg/dL (ref 8–23)
CO2: 28 mmol/L (ref 22–32)
Calcium: 9.1 mg/dL (ref 8.9–10.3)
Chloride: 101 mmol/L (ref 98–111)
Creatinine, Ser: 1.09 mg/dL — ABNORMAL HIGH (ref 0.44–1.00)
GFR, Estimated: 49 mL/min — ABNORMAL LOW (ref >60)
Glucose, Bld: 359 mg/dL — ABNORMAL HIGH (ref 70–99)
Potassium: 3.8 mmol/L (ref 3.5–5.1)
Sodium: 136 mmol/L (ref 135–145)
Total Bilirubin: 0.5 mg/dL (ref 0.3–1.2)
Total Protein: 6.2 g/dL — ABNORMAL LOW (ref 6.5–8.1)

## 2021-09-14 NOTE — Assessment & Plan Note (Signed)
She has history of multifactorial anemia but now I am concerned about anemia due to malignancy Observe closely for now

## 2021-09-14 NOTE — Assessment & Plan Note (Signed)
She has intermittent uncontrolled diabetes She will continue to monitor her blood sugar carefully

## 2021-09-14 NOTE — Progress Notes (Signed)
[facility] OFFICE PROGRESS NOTE  Patient Care Team: [name], Bill Salinas, MD as PCP - General (Family Medicine) Sharyne Peach, MD as Consulting Physician (Ophthalmology) Inda Castle, MD (Inactive) as Consulting Physician (Gastroenterology) Sypher, Herbie Baltimore, MD (Inactive) as Consulting Physician (Orthopedic Surgery) Jannette Spanner, MD as Referring Physician (Dermatology) Heath Lark, MD as Consulting Physician (Hematology and Oncology) Alphonsa Overall, MD as Consulting Physician (General Surgery)  ASSESSMENT & PLAN:  Marginal zone lymphoma I am concerned about cancer relapse She has lost a lot of weight, with drenching night sweats and anemia She had history of recurrent lymphoma intermittently in the past I recommend CT imaging to be done next week I will see her back the following week to review test results  Anemia in neoplastic disease She has history of multifactorial anemia but now I am concerned about anemia due to malignancy Observe closely for now  Type 2 diabetes mellitus with diabetic nephropathy She has intermittent uncontrolled diabetes She will continue to monitor her blood sugar carefully  Night sweats She has recent onset of drenching night sweats With her recent weight loss and history of stage IV lymphoma, I recommend CT imaging and she is in agreement We discussed importance of increasing oral fluid intake as tolerated  Orders Placed This Encounter  Procedures   CT CHEST ABDOMEN PELVIS W CONTRAST    Standing Status:   Future    Standing Expiration Date:   09/15/2022    Order Specific Question:   Preferred imaging location?    Answer:   [facility]    Order Specific Question:   Radiology Contrast Protocol - do NOT remove file path    Answer:   [email or web address]    All questions were answered. The patient knows to call the clinic with any problems, questions or concerns. The total time  spent in the appointment was 30 minutes encounter with patients including review of chart and various tests results, discussions about plan of care and coordination of care plan   Heath Lark, MD 09/14/2021 5:15 PM  INTERVAL HISTORY: Please see below for problem oriented charting. she returns for surveillance follow-up for history of lymphoma She has lost a lot of weight since last time I saw her Her appetite is fair She has poor energy She denies recent bleeding She has frequent drenching night sweats at least 2-3 times a week which is new since her last visit No new lymphadenopathy  REVIEW OF SYSTEMS:   Eyes: Denies blurriness of vision Ears, nose, mouth, throat, and face: Denies mucositis or sore throat Respiratory: Denies cough, dyspnea or wheezes Cardiovascular: Denies palpitation, chest discomfort or lower extremity swelling Gastrointestinal:  Denies nausea, heartburn or change in bowel habits Skin: Denies abnormal skin rashes Lymphatics: Denies new lymphadenopathy or easy bruising Neurological:Denies numbness, tingling or new weaknesses Behavioral/Psych: Mood is stable, no new changes  All other systems were reviewed with the patient and are negative.  I have reviewed the past medical history, past surgical history, social history and family history with the patient and they are unchanged from previous note.  ALLERGIES:  is allergic to aspirin, capoten [captopril], glyburide, insulin detemir, other, saxagliptin, statins, and zocor [simvastatin].  MEDICATIONS:  Current Outpatient Medications  Medication Sig Dispense Refill   Accu-Chek FastClix Lancets MISC USE 1 TO CHECK GLUCOSE ONCE DAILY     acyclovir (ZOVIRAX) 400 MG tablet Take 1 tablet by mouth once daily 30 tablet 11   alendronate (FOSAMAX) 70 MG tablet Take 70 mg by mouth  once a week.     amLODipine (NORVASC) 10 MG tablet TAKE 1 TABLET BY MOUTH AT BEDTIME 90 tablet 0   amLODipine (NORVASC) 5 MG tablet Take 5 mg by  mouth daily.     aspirin 81 MG tablet Take 40.5 mg by mouth 3 (three) times a week.     Blood Glucose Monitoring Suppl (ACCU-CHEK GUIDE) w/Device KIT See admin instructions.     calcium carbonate (OSCAL) 1500 (600 Ca) MG TABS tablet 1 tablet with meals     Calcium Carbonate-Vit D-Min (CALCIUM 1200 PO) Take 1 tablet by mouth daily. Per patient takes in am and pm     cholecalciferol (VITAMIN D) 1000 UNITS tablet Take 1,000 Units by mouth daily.      enalapril (VASOTEC) 20 MG tablet Take 20 mg by mouth every morning.     ezetimibe (ZETIA) 10 MG tablet Take 1 tablet by mouth daily.     fish oil-omega-3 fatty acids 1000 MG capsule Take 1 g by mouth daily.      fluorouracil (EFUDEX) 5 % cream APPLY CREAM TOPICALLY TO AFFECTED AREA TWICE DAILY FOR 14 DAYS     glimepiride (AMARYL) 4 MG tablet Take 4 mg by mouth daily with breakfast. Per patient takes in am and pm.     glucose blood test strip Use as instructed 100 each 12   lidocaine-prilocaine (EMLA) cream Apply 1 application topically as needed. 30 g 3   loperamide (IMODIUM) 2 MG capsule Take by mouth as needed for diarrhea or loose stools.     Magnesium 250 MG TABS 1 tablet with a meal     Magnesium 500 MG CAPS Take 500 mg by mouth daily.      metFORMIN (GLUCOPHAGE) 1000 MG tablet Take 1,000 mg by mouth 2 (two) times daily with a meal.      metFORMIN (GLUCOPHAGE-XR) 500 MG 24 hr tablet SMARTSIG:1 Tablet(s) By Mouth Morning-Evening     Misc Natural Products (LUTEIN 20 PO) Take by mouth daily.     Multiple Vitamins-Minerals (CENTRUM CARDIO) TABS Take 1 tablet by mouth daily.     Multiple Vitamins-Minerals (ICAPS) CAPS Take by mouth daily.     No current facility-administered medications for this visit.    SUMMARY OF ONCOLOGIC HISTORY: Oncology History Overview Note  Marginal zone lymphoma, with lymph node and bone marrow involvement along with splenomegaly   Primary site: Lymphoid Neoplasms   Staging method: AJCC 6th Edition   Clinical: Stage  IV signed by Heath Lark, MD on 09/02/2013  7:38 PM   Summary: Stage IV     Marginal zone lymphoma ([place])  07/29/2013 Imaging   CT scan shows splenomegaly and abnormal lymphadenopathy.   08/13/2013 Imaging   PET CT scan confirmed lymphadenopathy and splenomegaly, those areas are PET avid   08/23/2013 Surgery   [id number] left axillary lymph node biopsy confirmed a low-grade lymphoma.   08/27/2013 Bone Marrow Biopsy   Bone marrow biopsy confirmed lymphoma.[id number]   09/08/2013 - 09/29/2013 Chemotherapy   She completed 4 cycles of rituximab with resolution of splenomegaly.   12/29/2013 Imaging   PET scan show complete response.   07/11/2014 Imaging   PET scan showed recurrent disease   07/19/2014 - 08/09/2014 Chemotherapy   She completed 4 cycles of rituximab with resolution of splenomegaly   10/12/2014 Imaging   Repeat PET/CT scan showed near complete response to treatment.   10/13/2014 - 02/09/2015 Chemotherapy   She is placed on rituximab maintenance therapy.   03/29/2015 Imaging  PET CT showed possible mild progression of splenomegaly   04/26/2015 - 02/22/2016 Chemotherapy   She started taking Ibrutinib   04/28/2015 Adverse Reaction   Treatment was placed temporarily on hold due to severe diarrhea and resumed at reduced dose   07/03/2015 PET scan   No hypermetabolic adenopathy in the neck, chest, abdomen or pelvis.2. Spleen is not hypermetabolic and appears slightly less prominent size.   02/06/2016 PET scan   Hypermetabolic LEFT axillary lymph nodes. Concern for HIGH-GRADE LYMPHOMA LOCALIZED RECURRENCE. 2. Prominent spleen without hypermetabolic activity. No change in size from comparison. 3. No additional hypermetabolic adenopathy on scan.   02/19/2016 Procedure   Technically successful ultrasound-guided core biopsy, left axillary adenopathy.   02/19/2016 Pathology Results   Lymph node, needle/core biopsy, left axilla - ATYPICAL LYMPHOID PROLIFERATION SUSPICIOUS FOR LYMPHOMA. - SEE  COMMENT. Microscopic Comment The sections show multiple very small and skinny fragments of lymph nodal tissue displaying areas of lymphoid expansion by a population of small to medium size lymphocytes with round to irregular nuclei, scanty to moderately abundant cytoplasm, dense chromatin and inconspicuous nucleoli. This is associated with scattering of "naked" germinal centers with abundant tingible body macrophages. To further evaluate this process, flow cytometric analysis was performed and shows a minor population of monoclonal B cells expressing pan B-cell antigens including CD20 with associated kappa light chain restriction. In addition, immunohistochemical stains were performed including CD20, CD79a, CD3, CD5, CD10, BCL-2, CD21 with appropriate controls. The stains show a mixture of T and B cells with slight predominance of B cells. CD21 highlights numerous areas with dendritic networks, correlating with the presence of germinal centers. CD10 shows focal positivity likely in germinal centers. BCL-2 is diffusely positive except in areas of apparent germinal centers. The overall features are atypical and suspicious for involvement by a B-cell lymphoproliferative process, particularly given the B-cell monoclonality by flow cytometry. However, morphologic evaluation is extremely limited due to small biopsy fragments and additional material (excisional biopsy) is strongly recommended for further evaluation. (BNS:ecj 02/21/2016)    03/04/2016 Procedure   Placement of single lumen port a cath via right internal jugular vein. The catheter tip lies at the cavo-atrial junction. A power injectable port a cath was placed and is ready for immediate use.   03/07/2016 - 05/31/2016 Chemotherapy   She received treatment with bendamustine and Gazyva   05/27/2016 PET scan   1. No evidence of active lymphoma on whole-body scan. 2. Resolution of metabolic activity previously identified in the LEFT axilla. 3. Small amount  free fluid the pelvis.   03/07/2017 PET scan   1. No findings of active lymphoma in the chest, abdomen, or pelvis. 2. Other imaging findings of potential clinical significance: Aortic Atherosclerosis (ICD10-I70.0). Coronary atherosclerosis. Chronic paranasal sinusitis. Moderately distended gallbladder, chronic   05/02/2020 Procedure   Removal of implanted Port-A-Cath utilizing sharp and blunt dissection. The procedure was uncomplicated.     PHYSICAL EXAMINATION: ECOG PERFORMANCE STATUS: 1 - Symptomatic but completely ambulatory  Vitals:   09/14/21 1212  BP: (!) 159/69  Pulse: 83  Resp: 18  Temp: 99.3 F (37.4 C)  SpO2: 100%   Filed Weights   09/14/21 1212  Weight: 150 lb 9.6 oz (68.3 kg)    GENERAL:alert, no distress and comfortable SKIN: skin color, texture, turgor are normal, no rashes or significant lesions EYES: normal, Conjunctiva are pink and non-injected, sclera clear OROPHARYNX:no exudate, no erythema and lips, buccal mucosa, and tongue normal  NECK: supple, thyroid normal size, non-tender, without nodularity LYMPH:  no palpable lymphadenopathy in the cervical, axillary or inguinal LUNGS: clear to auscultation and percussion with normal breathing effort HEART: regular rate & rhythm and no murmurs and no lower extremity edema ABDOMEN:abdomen soft, non-tender and normal bowel sounds Musculoskeletal:no cyanosis of digits and no clubbing  NEURO: alert & oriented x 3 with fluent speech, no focal motor/sensory deficits  LABORATORY DATA:  I have reviewed the data as listed    Component Value Date/Time   NA 136 09/14/2021 1159   NA 136 01/07/2017 1004   K 3.8 09/14/2021 1159   K 4.5 01/07/2017 1004   CL 101 09/14/2021 1159   CO2 28 09/14/2021 1159   CO2 23 01/07/2017 1004   GLUCOSE 359 (H) 09/14/2021 1159   GLUCOSE 217 (H) 01/07/2017 1004   BUN 17 09/14/2021 1159   BUN 23.3 01/07/2017 1004   CREATININE 1.09 (H) 09/14/2021 1159   CREATININE 1.37 (H) 07/06/2018 0959    CREATININE 1.2 (H) 01/07/2017 1004   CALCIUM 9.1 09/14/2021 1159   CALCIUM 9.4 01/07/2017 1004   PROT 6.2 (L) 09/14/2021 1159   PROT 6.7 01/07/2017 1004   ALBUMIN 3.7 09/14/2021 1159   ALBUMIN 3.9 01/07/2017 1004   AST 13 (L) 09/14/2021 1159   AST 15 07/06/2018 0959   AST 20 01/07/2017 1004   ALT 15 09/14/2021 1159   ALT 10 07/06/2018 0959   ALT 14 01/07/2017 1004   ALKPHOS 119 09/14/2021 1159   ALKPHOS 70 01/07/2017 1004   BILITOT 0.5 09/14/2021 1159   BILITOT 0.4 07/06/2018 0959   BILITOT 0.49 01/07/2017 1004   GFRNONAA 49 (L) 09/14/2021 1159   GFRNONAA 36 (L) 07/06/2018 0959   GFRNONAA 47 (L) 05/18/2013 1256   GFRAA 40 (L) 09/28/2019 1053   GFRAA 41 (L) 07/06/2018 0959   GFRAA 55 (L) 05/18/2013 1256    No results found for: "SPEP", "UPEP"  Lab Results  Component Value Date   WBC 7.2 09/14/2021   NEUTROABS 5.1 09/14/2021   HGB 9.5 (L) 09/14/2021   HCT 29.1 (L) 09/14/2021   MCV 79.5 (L) 09/14/2021   PLT 275 09/14/2021      Chemistry      Component Value Date/Time   NA 136 09/14/2021 1159   NA 136 01/07/2017 1004   K 3.8 09/14/2021 1159   K 4.5 01/07/2017 1004   CL 101 09/14/2021 1159   CO2 28 09/14/2021 1159   CO2 23 01/07/2017 1004   BUN 17 09/14/2021 1159   BUN 23.3 01/07/2017 1004   CREATININE 1.09 (H) 09/14/2021 1159   CREATININE 1.37 (H) 07/06/2018 0959   CREATININE 1.2 (H) 01/07/2017 1004   GLU 202 (H) 08/09/2014 1000      Component Value Date/Time   CALCIUM 9.1 09/14/2021 1159   CALCIUM 9.4 01/07/2017 1004   ALKPHOS 119 09/14/2021 1159   ALKPHOS 70 01/07/2017 1004   AST 13 (L) 09/14/2021 1159   AST 15 07/06/2018 0959   AST 20 01/07/2017 1004   ALT 15 09/14/2021 1159   ALT 10 07/06/2018 0959   ALT 14 01/07/2017 1004   BILITOT 0.5 09/14/2021 1159   BILITOT 0.4 07/06/2018 0959   BILITOT 0.49 01/07/2017 1004

## 2021-09-14 NOTE — Assessment & Plan Note (Signed)
I am concerned about cancer relapse She has lost a lot of weight, with drenching night sweats and anemia She had history of recurrent lymphoma intermittently in the past I recommend CT imaging to be done next week I will see her back the following week to review test results

## 2021-09-14 NOTE — Assessment & Plan Note (Signed)
She has recent onset of drenching night sweats With her recent weight loss and history of stage IV lymphoma, I recommend CT imaging and she is in agreement We discussed importance of increasing oral fluid intake as tolerated

## 2021-09-21 ENCOUNTER — Ambulatory Visit (HOSPITAL_BASED_OUTPATIENT_CLINIC_OR_DEPARTMENT_OTHER)
Admission: RE | Admit: 2021-09-21 | Discharge: 2021-09-21 | Disposition: A | Discharge status: Home / Self Care | Payer: Medicare Other | Source: Ambulatory Visit | Attending: Hematology and Oncology | Admitting: Hematology and Oncology

## 2021-09-21 DIAGNOSIS — C8580 Other specified types of non-Hodgkin lymphoma, unspecified site: Secondary | ICD-10-CM | POA: Insufficient documentation

## 2021-09-21 MED ORDER — IOHEXOL 300 MG/ML  SOLN
100.0000 mL | Freq: Once | INTRAMUSCULAR | Status: AC | PRN
  Administered 2021-09-21: 85 mL via INTRAVENOUS

## 2021-09-24 ENCOUNTER — Telehealth: Payer: Self-pay

## 2021-09-24 NOTE — Telephone Encounter (Signed)
Called and left a message asking her to call the office back regarding appt.

## 2021-09-24 NOTE — Telephone Encounter (Signed)
-----   Message from Heath Lark, MD sent at 09/24/2021  8:35 AM EDT ----- Can you she come in at 2 pm today instead of tomorrow? CT suggested possible gallbladder issue

## 2021-09-25 ENCOUNTER — Other Ambulatory Visit: Payer: Self-pay

## 2021-09-25 ENCOUNTER — Inpatient Hospital Stay: Payer: Medicare Other | Admitting: Hematology and Oncology

## 2021-09-25 DIAGNOSIS — Z8572 Personal history of non-Hodgkin lymphomas: Secondary | ICD-10-CM | POA: Diagnosis not present

## 2021-09-25 DIAGNOSIS — C8580 Other specified types of non-Hodgkin lymphoma, unspecified site: Secondary | ICD-10-CM | POA: Diagnosis not present

## 2021-09-25 DIAGNOSIS — N183 Chronic kidney disease, stage 3 unspecified: Secondary | ICD-10-CM | POA: Diagnosis not present

## 2021-09-25 DIAGNOSIS — K5909 Other constipation: Secondary | ICD-10-CM

## 2021-09-25 DIAGNOSIS — K2100 Gastro-esophageal reflux disease with esophagitis, without bleeding: Secondary | ICD-10-CM

## 2021-09-26 ENCOUNTER — Ambulatory Visit: Payer: Medicare Other | Admitting: Podiatry

## 2021-09-26 ENCOUNTER — Encounter: Payer: Self-pay | Admitting: Podiatry

## 2021-09-26 DIAGNOSIS — M79674 Pain in right toe(s): Secondary | ICD-10-CM

## 2021-09-26 DIAGNOSIS — M79675 Pain in left toe(s): Secondary | ICD-10-CM

## 2021-09-26 DIAGNOSIS — B351 Tinea unguium: Secondary | ICD-10-CM

## 2021-09-26 DIAGNOSIS — E1121 Type 2 diabetes mellitus with diabetic nephropathy: Secondary | ICD-10-CM

## 2021-09-26 DIAGNOSIS — N183 Chronic kidney disease, stage 3 unspecified: Secondary | ICD-10-CM

## 2021-09-26 NOTE — Progress Notes (Signed)
This patient returns to my office for at risk foot care.  This patient requires this care by a professional since this patient will be at risk due to having chronic kidney disease and type 2 diabetes.  This patient is unable to cut nails herself since the patient cannot reach her nails.These nails are painful walking and wearing shoes.  This patient presents for at risk foot care today.  General Appearance  Alert, conversant and in no acute stress.  Vascular  Dorsalis pedis and posterior tibial  pulses are palpable  bilaterally.  Capillary return is within normal limits  bilaterally. Temperature is within normal limits  bilaterally.  Neurologic  Senn-Weinstein monofilament wire test within normal limits  bilaterally. Muscle power within normal limits bilaterally.  Nails Thick disfigured discolored nails with subungual debris  from hallux to fifth toes bilaterally. No evidence of bacterial infection or drainage bilaterally.  Orthopedic  No limitations of motion  feet .  No crepitus or effusions noted.  No bony pathology or digital deformities noted.  HAV  B/L.  Skin  normotropic skin with no porokeratosis noted bilaterally.  No signs of infections or ulcers noted.     Onychomycosis  Pain in right toes  Pain in left toes  Consent was obtained for treatment procedures.   Mechanical debridement of nails 1-5  bilaterally performed with a nail nipper.  Filed with dremel without incident.    Return office visit   10 weeks                   Told patient to return for periodic foot care and evaluation due to potential at risk complications.   Bergen Magner DPM  

## 2021-09-27 ENCOUNTER — Encounter: Payer: Self-pay | Admitting: Hematology and Oncology

## 2021-09-27 DIAGNOSIS — K5909 Other constipation: Secondary | ICD-10-CM | POA: Insufficient documentation

## 2021-09-27 NOTE — Progress Notes (Signed)
[facility] OFFICE PROGRESS NOTE  Patient Care Team: [name], Bill Salinas, MD as PCP - General (Family Medicine) Sharyne Peach, MD as Consulting Physician (Ophthalmology) Inda Castle, MD (Inactive) as Consulting Physician (Gastroenterology) Sypher, Herbie Baltimore, MD (Inactive) as Consulting Physician (Orthopedic Surgery) Jannette Spanner, MD as Referring Physician (Dermatology) Heath Lark, MD as Consulting Physician (Hematology and Oncology) Alphonsa Overall, MD as Consulting Physician (General Surgery)  ASSESSMENT & PLAN:  Marginal zone lymphoma We reviewed imaging studies together She has no signs of cancer recurrence and reassured I will see her back in a year for further follow-up Her recent weight loss and new onset of anemia is not due to cancer recurrence  Other constipation She has signs of severe constipation on CT imaging and had very large bowel movement several times afterwards We discussed importance of laxative management Suspect her chronic constipation might have caused reduced oral intake  Chronic kidney disease (CKD), stage III (moderate) Her kidney function tests is stable. Continue close monitoring and risk factor modification  Reflux esophagitis She had history of reflux esophagitis CT imaging show some changes that could explained perhaps her mild anemia The patient is not symptomatic Observe only  No orders of the defined types were placed in this encounter.   All questions were answered. The patient knows to call the clinic with any problems, questions or concerns. The total time spent in the appointment was 30 minutes encounter with patients including review of chart and various tests results, discussions about plan of care and coordination of care plan   Heath Lark, MD 09/27/2021 7:05 AM  INTERVAL HISTORY: Please see below for problem oriented charting. she returns for review of imaging studies She have no new symptoms She has  significant multiple bowel movements after CT imaging and felt better  REVIEW OF SYSTEMS:   Constitutional: Denies fevers, chills or abnormal weight loss Eyes: Denies blurriness of vision Ears, nose, mouth, throat, and face: Denies mucositis or sore throat Respiratory: Denies cough, dyspnea or wheezes Cardiovascular: Denies palpitation, chest discomfort or lower extremity swelling Gastrointestinal:  Denies nausea, heartburn or change in bowel habits Skin: Denies abnormal skin rashes Lymphatics: Denies new lymphadenopathy or easy bruising Neurological:Denies numbness, tingling or new weaknesses Behavioral/Psych: Mood is stable, no new changes  All other systems were reviewed with the patient and are negative.  I have reviewed the past medical history, past surgical history, social history and family history with the patient and they are unchanged from previous note.  ALLERGIES:  is allergic to aspirin, capoten [captopril], glyburide, insulin detemir, other, saxagliptin, statins, and zocor [simvastatin].  MEDICATIONS:  Current Outpatient Medications  Medication Sig Dispense Refill   Accu-Chek FastClix Lancets MISC USE 1 TO CHECK GLUCOSE ONCE DAILY     acyclovir (ZOVIRAX) 400 MG tablet Take 1 tablet by mouth once daily 30 tablet 11   alendronate (FOSAMAX) 70 MG tablet Take 70 mg by mouth once a week.     amLODipine (NORVASC) 10 MG tablet TAKE 1 TABLET BY MOUTH AT BEDTIME 90 tablet 0   amLODipine (NORVASC) 5 MG tablet Take 5 mg by mouth daily.     aspirin 81 MG tablet Take 40.5 mg by mouth 3 (three) times a week.     Blood Glucose Monitoring Suppl (ACCU-CHEK GUIDE) w/Device KIT See admin instructions.     calcium carbonate (OSCAL) 1500 (600 Ca) MG TABS tablet 1 tablet with meals     Calcium Carbonate-Vit D-Min (CALCIUM 1200 PO) Take 1 tablet by mouth  daily. Per patient takes in am and pm     cholecalciferol (VITAMIN D) 1000 UNITS tablet Take 1,000 Units by mouth daily.      enalapril  (VASOTEC) 20 MG tablet Take 20 mg by mouth every morning.     ezetimibe (ZETIA) 10 MG tablet Take 1 tablet by mouth daily.     fish oil-omega-3 fatty acids 1000 MG capsule Take 1 g by mouth daily.      fluorouracil (EFUDEX) 5 % cream APPLY CREAM TOPICALLY TO AFFECTED AREA TWICE DAILY FOR 14 DAYS     glimepiride (AMARYL) 4 MG tablet Take 4 mg by mouth daily with breakfast. Per patient takes in am and pm.     glucose blood test strip Use as instructed 100 each 12   lidocaine-prilocaine (EMLA) cream Apply 1 application topically as needed. 30 g 3   loperamide (IMODIUM) 2 MG capsule Take by mouth as needed for diarrhea or loose stools.     Magnesium 250 MG TABS 1 tablet with a meal     Magnesium 500 MG CAPS Take 500 mg by mouth daily.      metFORMIN (GLUCOPHAGE) 1000 MG tablet Take 1,000 mg by mouth 2 (two) times daily with a meal.      metFORMIN (GLUCOPHAGE-XR) 500 MG 24 hr tablet SMARTSIG:1 Tablet(s) By Mouth Morning-Evening     Misc Natural Products (LUTEIN 20 PO) Take by mouth daily.     Multiple Vitamins-Minerals (CENTRUM CARDIO) TABS Take 1 tablet by mouth daily.     Multiple Vitamins-Minerals (ICAPS) CAPS Take by mouth daily.     No current facility-administered medications for this visit.    SUMMARY OF ONCOLOGIC HISTORY: Oncology History Overview Note  Marginal zone lymphoma, with lymph node and bone marrow involvement along with splenomegaly   Primary site: Lymphoid Neoplasms   Staging method: AJCC 6th Edition   Clinical: Stage IV signed by Heath Lark, MD on 09/02/2013  7:38 PM   Summary: Stage IV     Marginal zone lymphoma ([place])  07/29/2013 Imaging   CT scan shows splenomegaly and abnormal lymphadenopathy.   08/13/2013 Imaging   PET CT scan confirmed lymphadenopathy and splenomegaly, those areas are PET avid   08/23/2013 Surgery   [id number] left axillary lymph node biopsy confirmed a low-grade lymphoma.   08/27/2013 Bone Marrow Biopsy   Bone marrow biopsy confirmed  lymphoma.[id number]   09/08/2013 - 09/29/2013 Chemotherapy   She completed 4 cycles of rituximab with resolution of splenomegaly.   12/29/2013 Imaging   PET scan show complete response.   07/11/2014 Imaging   PET scan showed recurrent disease   07/19/2014 - 08/09/2014 Chemotherapy   She completed 4 cycles of rituximab with resolution of splenomegaly   10/12/2014 Imaging   Repeat PET/CT scan showed near complete response to treatment.   10/13/2014 - 02/09/2015 Chemotherapy   She is placed on rituximab maintenance therapy.   03/29/2015 Imaging   PET CT showed possible mild progression of splenomegaly   04/26/2015 - 02/22/2016 Chemotherapy   She started taking Ibrutinib   04/28/2015 Adverse Reaction   Treatment was placed temporarily on hold due to severe diarrhea and resumed at reduced dose   07/03/2015 PET scan   No hypermetabolic adenopathy in the neck, chest, abdomen or pelvis.2. Spleen is not hypermetabolic and appears slightly less prominent size.   02/06/2016 PET scan   Hypermetabolic LEFT axillary lymph nodes. Concern for HIGH-GRADE LYMPHOMA LOCALIZED RECURRENCE. 2. Prominent spleen without hypermetabolic activity. No change in size from  comparison. 3. No additional hypermetabolic adenopathy on scan.   02/19/2016 Procedure   Technically successful ultrasound-guided core biopsy, left axillary adenopathy.   02/19/2016 Pathology Results   Lymph node, needle/core biopsy, left axilla - ATYPICAL LYMPHOID PROLIFERATION SUSPICIOUS FOR LYMPHOMA. - SEE COMMENT. Microscopic Comment The sections show multiple very small and skinny fragments of lymph nodal tissue displaying areas of lymphoid expansion by a population of small to medium size lymphocytes with round to irregular nuclei, scanty to moderately abundant cytoplasm, dense chromatin and inconspicuous nucleoli. This is associated with scattering of "naked" germinal centers with abundant tingible body macrophages. To further evaluate this process,  flow cytometric analysis was performed and shows a minor population of monoclonal B cells expressing pan B-cell antigens including CD20 with associated kappa light chain restriction. In addition, immunohistochemical stains were performed including CD20, CD79a, CD3, CD5, CD10, BCL-2, CD21 with appropriate controls. The stains show a mixture of T and B cells with slight predominance of B cells. CD21 highlights numerous areas with dendritic networks, correlating with the presence of germinal centers. CD10 shows focal positivity likely in germinal centers. BCL-2 is diffusely positive except in areas of apparent germinal centers. The overall features are atypical and suspicious for involvement by a B-cell lymphoproliferative process, particularly given the B-cell monoclonality by flow cytometry. However, morphologic evaluation is extremely limited due to small biopsy fragments and additional material (excisional biopsy) is strongly recommended for further evaluation. (BNS:ecj 02/21/2016)    03/04/2016 Procedure   Placement of single lumen port a cath via right internal jugular vein. The catheter tip lies at the cavo-atrial junction. A power injectable port a cath was placed and is ready for immediate use.   03/07/2016 - 05/31/2016 Chemotherapy   She received treatment with bendamustine and Gazyva   05/27/2016 PET scan   1. No evidence of active lymphoma on whole-body scan. 2. Resolution of metabolic activity previously identified in the LEFT axilla. 3. Small amount free fluid the pelvis.   03/07/2017 PET scan   1. No findings of active lymphoma in the chest, abdomen, or pelvis. 2. Other imaging findings of potential clinical significance: Aortic Atherosclerosis (ICD10-I70.0). Coronary atherosclerosis. Chronic paranasal sinusitis. Moderately distended gallbladder, chronic   05/02/2020 Procedure   Removal of implanted Port-A-Cath utilizing sharp and blunt dissection. The procedure was uncomplicated.   09/24/2021  Imaging   1. No adenopathy above or below the diaphragm and no splenomegaly and no suspicious soft tissue or osseous lesion identified to suggest lymphomatous disease recurrence. 2. Small hiatal hernia with symmetric distal esophageal wall thickening and reflux/retained contrast in the distal esophagus suggestive of reflux esophagitis consider further evaluation with endoscopy if clinically indicated. 3. Distended gallbladder without CT findings of acute cholecystitis, commonly reflects fasting state, suggest correlation with right upper quadrant tenderness to palpation. 4. Moderate volume of formed stool throughout the colon as can be seen with constipation. 5.  Aortic Atherosclerosis (ICD10-I70.0).     PHYSICAL EXAMINATION: ECOG PERFORMANCE STATUS: 1 - Symptomatic but completely ambulatory  Vitals:   09/25/21 1416  BP: (!) 173/72  Pulse: 85  Resp: 18  Temp: 98.4 F (36.9 C)  SpO2: 99%   Filed Weights   09/25/21 1416  Weight: 150 lb 12.8 oz (68.4 kg)    GENERAL:alert, no distress and comfortable NEURO: alert & oriented x 3 with fluent speech, no focal motor/sensory deficits  LABORATORY DATA:  I have reviewed the data as listed    Component Value Date/Time   NA 136 09/14/2021 1159  NA 136 01/07/2017 1004   K 3.8 09/14/2021 1159   K 4.5 01/07/2017 1004   CL 101 09/14/2021 1159   CO2 28 09/14/2021 1159   CO2 23 01/07/2017 1004   GLUCOSE 359 (H) 09/14/2021 1159   GLUCOSE 217 (H) 01/07/2017 1004   BUN 17 09/14/2021 1159   BUN 23.3 01/07/2017 1004   CREATININE 1.09 (H) 09/14/2021 1159   CREATININE 1.37 (H) 07/06/2018 0959   CREATININE 1.2 (H) 01/07/2017 1004   CALCIUM 9.1 09/14/2021 1159   CALCIUM 9.4 01/07/2017 1004   PROT 6.2 (L) 09/14/2021 1159   PROT 6.7 01/07/2017 1004   ALBUMIN 3.7 09/14/2021 1159   ALBUMIN 3.9 01/07/2017 1004   AST 13 (L) 09/14/2021 1159   AST 15 07/06/2018 0959   AST 20 01/07/2017 1004   ALT 15 09/14/2021 1159   ALT 10 07/06/2018 0959    ALT 14 01/07/2017 1004   ALKPHOS 119 09/14/2021 1159   ALKPHOS 70 01/07/2017 1004   BILITOT 0.5 09/14/2021 1159   BILITOT 0.4 07/06/2018 0959   BILITOT 0.49 01/07/2017 1004   GFRNONAA 49 (L) 09/14/2021 1159   GFRNONAA 36 (L) 07/06/2018 0959   GFRNONAA 47 (L) 05/18/2013 1256   GFRAA 40 (L) 09/28/2019 1053   GFRAA 41 (L) 07/06/2018 0959   GFRAA 55 (L) 05/18/2013 1256    No results found for: "SPEP", "UPEP"  Lab Results  Component Value Date   WBC 7.2 09/14/2021   NEUTROABS 5.1 09/14/2021   HGB 9.5 (L) 09/14/2021   HCT 29.1 (L) 09/14/2021   MCV 79.5 (L) 09/14/2021   PLT 275 09/14/2021      Chemistry      Component Value Date/Time   NA 136 09/14/2021 1159   NA 136 01/07/2017 1004   K 3.8 09/14/2021 1159   K 4.5 01/07/2017 1004   CL 101 09/14/2021 1159   CO2 28 09/14/2021 1159   CO2 23 01/07/2017 1004   BUN 17 09/14/2021 1159   BUN 23.3 01/07/2017 1004   CREATININE 1.09 (H) 09/14/2021 1159   CREATININE 1.37 (H) 07/06/2018 0959   CREATININE 1.2 (H) 01/07/2017 1004   GLU 202 (H) 08/09/2014 1000      Component Value Date/Time   CALCIUM 9.1 09/14/2021 1159   CALCIUM 9.4 01/07/2017 1004   ALKPHOS 119 09/14/2021 1159   ALKPHOS 70 01/07/2017 1004   AST 13 (L) 09/14/2021 1159   AST 15 07/06/2018 0959   AST 20 01/07/2017 1004   ALT 15 09/14/2021 1159   ALT 10 07/06/2018 0959   ALT 14 01/07/2017 1004   BILITOT 0.5 09/14/2021 1159   BILITOT 0.4 07/06/2018 0959   BILITOT 0.49 01/07/2017 1004       RADIOGRAPHIC STUDIES: I have reviewed CT imaging with the patient I have personally reviewed the radiological images as listed and agreed with the findings in the report. CT CHEST ABDOMEN PELVIS W CONTRAST  Result Date: 09/22/2021 CLINICAL DATA:  History of marginal zone lymphoma, staging/follow-up. * Tracking Code: BO * EXAM: CT CHEST, ABDOMEN, AND PELVIS WITH CONTRAST TECHNIQUE: Multidetector CT imaging of the chest, abdomen and pelvis was performed following the standard  protocol during bolus administration of intravenous contrast. RADIATION DOSE REDUCTION: This exam was performed according to the departmental dose-optimization program which includes automated exposure control, adjustment of the mA and/or kV according to patient size and/or use of iterative reconstruction technique. CONTRAST:  74m OMNIPAQUE IOHEXOL 300 MG/ML  SOLN COMPARISON:  Multiple priors including most recent PET-CT March 07, 2017. FINDINGS: CT CHEST FINDINGS Cardiovascular: Aortic atherosclerosis. No central pulmonary embolus on this nondedicated study. Calcifications of the mitral annulus. Coronary artery calcifications. Normal size heart. No significant pericardial effusion/thickening. Mediastinum/Nodes: No supraclavicular adenopathy. No suspicious thyroid nodule. No pathologically enlarged mediastinal, hilar or axillary lymph nodes. Small hiatal hernia with distal esophageal wall thickening and retained/reflux fluid in the esophagus. Lungs/Pleura: Biapical pleuroparenchymal scarring. Mild diffuse bronchial wall thickening. Linear band of scarring/atelectasis in the left lung base. No suspicious pulmonary nodules or masses. No pleural effusion. No pneumothorax. Musculoskeletal: No suspicious chest wall mass. No aggressive lytic or blastic lesion of bone. Multilevel degenerative changes spine. CT ABDOMEN PELVIS FINDINGS Hepatobiliary: Calcified hepatic granulomata. No suspicious hepatic lesion. Gallbladder is distended without wall thickening or pericholecystic fluid. Mild prominence of the biliary tree is similar prior and within normal limits for patient's age. Pancreas: No pancreatic ductal dilation or evidence of acute inflammation. Spleen: Calcified splenic granulomata. No splenomegaly or focal splenic lesion. Adrenals/Urinary Tract: Bilateral adrenal glands appear normal. No hydronephrosis. Bilateral hypodense renal lesions measure fluid density consistent with cysts and considered benign requiring  no independent imaging follow-up. Kidneys demonstrate symmetric enhancement and excretion of contrast material. Urinary bladder is unremarkable for degree of distension. Stomach/Bowel: Radiopaque enteric contrast material traverses the mid colon. Small hiatal hernia. Otherwise, the stomach is unremarkable for degree of distension. No pathologic dilation of small or large bowel. Moderate volume of formed stool throughout the colon as can be seen with constipation. Vascular/Lymphatic: Aortic atherosclerosis. No pathologically enlarged abdominal or pelvic lymph nodes. Reproductive: Uterus and bilateral adnexa are unremarkable. Other: No significant abdominopelvic free fluid. Musculoskeletal: No aggressive lytic or blastic lesion of bone. Multilevel degenerative changes spine. IMPRESSION: 1. No adenopathy above or below the diaphragm and no splenomegaly and no suspicious soft tissue or osseous lesion identified to suggest lymphomatous disease recurrence. 2. Small hiatal hernia with symmetric distal esophageal wall thickening and reflux/retained contrast in the distal esophagus suggestive of reflux esophagitis consider further evaluation with endoscopy if clinically indicated. 3. Distended gallbladder without CT findings of acute cholecystitis, commonly reflects fasting state, suggest correlation with right upper quadrant tenderness to palpation. 4. Moderate volume of formed stool throughout the colon as can be seen with constipation. 5.  Aortic Atherosclerosis (ICD10-I70.0). Electronically Signed   By: Dahlia Bailiff M.D.   On: 09/22/2021 08:54

## 2021-09-27 NOTE — Assessment & Plan Note (Signed)
She had history of reflux esophagitis CT imaging show some changes that could explained perhaps her mild anemia The patient is not symptomatic Observe only

## 2021-09-27 NOTE — Assessment & Plan Note (Signed)
Her kidney function tests is stable. Continue close monitoring and risk factor modification

## 2021-09-27 NOTE — Assessment & Plan Note (Signed)
We reviewed imaging studies together She has no signs of cancer recurrence and reassured I will see her back in a year for further follow-up Her recent weight loss and new onset of anemia is not due to cancer recurrence

## 2021-09-27 NOTE — Assessment & Plan Note (Signed)
She has signs of severe constipation on CT imaging and had very large bowel movement several times afterwards We discussed importance of laxative management Suspect her chronic constipation might have caused reduced oral intake

## 2021-09-28 ENCOUNTER — Ambulatory Visit: Payer: Self-pay

## 2021-09-28 NOTE — Patient Outreach (Signed)
  Care Coordination   09/28/2021 Name: Jamie Mendez MRN: [id number] DOB: 06-02-1933   Care Coordination Outreach Attempts:  An unsuccessful telephone outreach was attempted today to offer the patient information about available care coordination services as a benefit of their health plan.   Follow Up Plan:  Additional outreach attempts will be made to offer the patient care coordination information and services.   Encounter Outcome:  No Answer  Care Coordination Interventions Activated:  No   Care Coordination Interventions:  No, not indicated    [facility] [phone]

## 2021-10-04 ENCOUNTER — Other Ambulatory Visit (HOSPITAL_BASED_OUTPATIENT_CLINIC_OR_DEPARTMENT_OTHER): Payer: Self-pay | Admitting: Family Medicine

## 2021-10-04 DIAGNOSIS — M810 Age-related osteoporosis without current pathological fracture: Secondary | ICD-10-CM

## 2021-10-04 DIAGNOSIS — Z1239 Encounter for other screening for malignant neoplasm of breast: Secondary | ICD-10-CM

## 2021-10-19 ENCOUNTER — Telehealth: Payer: Self-pay

## 2021-10-19 NOTE — Patient Outreach (Signed)
  Care Coordination   Initial Visit Note   10/19/2021 Name: Jamie Mendez MRN: [id number] DOB: 12-01-1933  Jamie Mendez is a 88 y.o. year old female who sees [facility] for primary care. I spoke with  Jamie Mendez by phone today.  What matters to the patients health and wellness today?  Health Maintenance    Goals Addressed             This Visit's Progress    Health Maintenance       Care Coordination Interventions: Reviewed plan for disease management. Reports adhering to treatment plans. Reports monitoring blood sugar levels as advised. Reports readings have been within established range. Current AIC is 7.5%. Reports managing well. Declined need for additional educational resources.  Reviewed medications. Reports managing well. Denies concerns r/t medication management or prescription cost. Assessed social determinant of health barriers. Record updated to reflect primary care transition to [facility].        SDOH assessments and interventions completed:  Yes  SDOH Interventions Today    Flowsheet Row Most Recent Value  SDOH Interventions   Food Insecurity Interventions Intervention Not Indicated  Transportation Interventions Intervention Not Indicated        Care Coordination Interventions Activated:  Yes  Care Coordination Interventions:  Yes, provided   Follow up plan:  Jamie Mendez will call for care coordination assistance as needed.    Encounter Outcome:  Pt. Visit Completed   [facility] [phone]

## 2021-10-19 NOTE — Patient Outreach (Signed)
  Care Coordination   10/19/2021 Name: Jamie Mendez MRN: [id number] DOB: 1933-08-02   Care Coordination Outreach Attempts:  A second unsuccessful outreach was attempted today to offer the patient with information about available care coordination services as a benefit of their health plan.     Follow Up Plan:  Additional outreach attempts will be made to offer the patient care coordination information and services.   Encounter Outcome:  No Answer  Care Coordination Interventions Activated:  No   Care Coordination Interventions:  No, not indicated    [facility] [phone]

## 2021-10-19 NOTE — Patient Instructions (Signed)
Visit Information  Thank you for allowing the Care Management team to participate in your care. It was great speaking with you today!  Following are the goals we discussed today:   Goals Addressed             This Visit's Progress    Health Maintenance       Care Coordination Interventions: Reviewed plan for disease management. Reports adhering to treatment plans. Reports monitoring blood sugar levels as advised. Reports readings have been within established range. Current AIC is 7.5%. Reports managing well. Declined need for additional educational resources.  Reviewed medications. Reports managing well. Denies concerns r/t medication management or prescription cost. Assessed social determinant of health barriers. Record updated to reflect primary care transition to [facility].        Ms. Boardman verbalized understanding of the information discussed during the telephonic outreach. Declined need for mailed instructions or resources.  Ms. Joaquin will call for care coordination assistance as needed.  [facility] [phone]

## 2021-10-22 ENCOUNTER — Ambulatory Visit (HOSPITAL_BASED_OUTPATIENT_CLINIC_OR_DEPARTMENT_OTHER)
Admission: RE | Admit: 2021-10-22 | Discharge: 2021-10-22 | Disposition: A | Discharge status: Home / Self Care | Payer: Medicare Other | Source: Ambulatory Visit | Attending: Family Medicine | Admitting: Family Medicine

## 2021-10-22 ENCOUNTER — Encounter (HOSPITAL_BASED_OUTPATIENT_CLINIC_OR_DEPARTMENT_OTHER): Payer: Self-pay

## 2021-10-22 DIAGNOSIS — Z13820 Encounter for screening for osteoporosis: Secondary | ICD-10-CM | POA: Insufficient documentation

## 2021-10-22 DIAGNOSIS — M8589 Other specified disorders of bone density and structure, multiple sites: Secondary | ICD-10-CM | POA: Insufficient documentation

## 2021-10-22 DIAGNOSIS — R921 Mammographic calcification found on diagnostic imaging of breast: Secondary | ICD-10-CM | POA: Diagnosis not present

## 2021-10-22 DIAGNOSIS — M810 Age-related osteoporosis without current pathological fracture: Secondary | ICD-10-CM | POA: Insufficient documentation

## 2021-10-22 DIAGNOSIS — Z780 Asymptomatic menopausal state: Secondary | ICD-10-CM | POA: Diagnosis not present

## 2021-10-22 DIAGNOSIS — Z1239 Encounter for other screening for malignant neoplasm of breast: Secondary | ICD-10-CM | POA: Insufficient documentation

## 2021-10-22 DIAGNOSIS — Z1231 Encounter for screening mammogram for malignant neoplasm of breast: Secondary | ICD-10-CM | POA: Diagnosis not present

## 2021-10-22 DIAGNOSIS — E119 Type 2 diabetes mellitus without complications: Secondary | ICD-10-CM | POA: Insufficient documentation

## 2021-10-24 ENCOUNTER — Other Ambulatory Visit: Payer: Self-pay | Admitting: Family Medicine

## 2021-10-24 DIAGNOSIS — R928 Other abnormal and inconclusive findings on diagnostic imaging of breast: Secondary | ICD-10-CM

## 2021-11-14 ENCOUNTER — Ambulatory Visit
Admission: RE | Admit: 2021-11-14 | Discharge: 2021-11-14 | Disposition: A | Discharge status: Home / Self Care | Payer: Medicare Other | Source: Ambulatory Visit | Attending: Family Medicine | Admitting: Family Medicine

## 2021-11-14 DIAGNOSIS — R928 Other abnormal and inconclusive findings on diagnostic imaging of breast: Secondary | ICD-10-CM

## 2021-12-05 ENCOUNTER — Encounter: Payer: Self-pay | Admitting: Podiatry

## 2021-12-05 ENCOUNTER — Ambulatory Visit: Payer: Medicare Other | Admitting: Podiatry

## 2021-12-05 DIAGNOSIS — M79675 Pain in left toe(s): Secondary | ICD-10-CM

## 2021-12-05 DIAGNOSIS — B351 Tinea unguium: Secondary | ICD-10-CM

## 2021-12-05 DIAGNOSIS — E1121 Type 2 diabetes mellitus with diabetic nephropathy: Secondary | ICD-10-CM | POA: Diagnosis not present

## 2021-12-05 DIAGNOSIS — M79674 Pain in right toe(s): Secondary | ICD-10-CM

## 2021-12-05 DIAGNOSIS — N183 Chronic kidney disease, stage 3 unspecified: Secondary | ICD-10-CM | POA: Diagnosis not present

## 2021-12-05 NOTE — Progress Notes (Signed)
This patient returns to my office for at risk foot care.  This patient requires this care by a professional since this patient will be at risk due to having chronic kidney disease and type 2 diabetes.  This patient is unable to cut nails herself since the patient cannot reach her nails.These nails are painful walking and wearing shoes.  This patient presents for at risk foot care today.  General Appearance  Alert, conversant and in no acute stress.  Vascular  Dorsalis pedis and posterior tibial  pulses are palpable  bilaterally.  Capillary return is within normal limits  bilaterally. Temperature is within normal limits  bilaterally.  Neurologic  Senn-Weinstein monofilament wire test within normal limits  bilaterally. Muscle power within normal limits bilaterally.  Nails Thick disfigured discolored nails with subungual debris  from hallux to fifth toes bilaterally. No evidence of bacterial infection or drainage bilaterally.  Orthopedic  No limitations of motion  feet .  No crepitus or effusions noted.  No bony pathology or digital deformities noted.  HAV  B/L.  Skin  normotropic skin with no porokeratosis noted bilaterally.  No signs of infections or ulcers noted.     Onychomycosis  Pain in right toes  Pain in left toes  Consent was obtained for treatment procedures.   Mechanical debridement of nails 1-5  bilaterally performed with a nail nipper.  Filed with dremel without incident.    Return office visit   10 weeks                   Told patient to return for periodic foot care and evaluation due to potential at risk complications.   Gardiner Barefoot DPM

## 2022-02-18 ENCOUNTER — Ambulatory Visit: Payer: Medicare Other | Admitting: Podiatry

## 2022-02-18 ENCOUNTER — Encounter: Payer: Self-pay | Admitting: Podiatry

## 2022-02-18 DIAGNOSIS — M79675 Pain in left toe(s): Secondary | ICD-10-CM

## 2022-02-18 DIAGNOSIS — M79674 Pain in right toe(s): Secondary | ICD-10-CM | POA: Diagnosis not present

## 2022-02-18 DIAGNOSIS — E1121 Type 2 diabetes mellitus with diabetic nephropathy: Secondary | ICD-10-CM

## 2022-02-18 DIAGNOSIS — B351 Tinea unguium: Secondary | ICD-10-CM | POA: Diagnosis not present

## 2022-02-18 DIAGNOSIS — N183 Chronic kidney disease, stage 3 unspecified: Secondary | ICD-10-CM

## 2022-02-18 NOTE — Progress Notes (Signed)
This patient returns to my office for at risk foot care.  This patient requires this care by a professional since this patient will be at risk due to having chronic kidney disease and type 2 diabetes.  This patient is unable to cut nails herself since the patient cannot reach her nails.These nails are painful walking and wearing shoes.  This patient presents for at risk foot care today.  General Appearance  Alert, conversant and in no acute stress.  Vascular  Dorsalis pedis and posterior tibial  pulses are palpable  bilaterally.  Capillary return is within normal limits  bilaterally. Temperature is within normal limits  bilaterally.  Neurologic  Senn-Weinstein monofilament wire test within normal limits  bilaterally. Muscle power within normal limits bilaterally.  Nails Thick disfigured discolored nails with subungual debris  from hallux to fifth toes bilaterally. No evidence of bacterial infection or drainage bilaterally.  Orthopedic  No limitations of motion  feet .  No crepitus or effusions noted.  No bony pathology or digital deformities noted.  HAV  B/L.  Skin  normotropic skin with no porokeratosis noted bilaterally.  No signs of infections or ulcers noted.     Onychomycosis  Pain in right toes  Pain in left toes  Consent was obtained for treatment procedures.   Mechanical debridement of nails 1-5  bilaterally performed with a nail nipper.  Filed with dremel without incident.    Return office visit   10 weeks                   Told patient to return for periodic foot care and evaluation due to potential at risk complications.   Gardiner Barefoot DPM

## 2022-05-21 ENCOUNTER — Ambulatory Visit: Payer: Medicare Other | Admitting: Podiatry

## 2022-05-22 ENCOUNTER — Encounter: Payer: Self-pay | Admitting: Podiatry

## 2022-05-22 ENCOUNTER — Ambulatory Visit (INDEPENDENT_AMBULATORY_CARE_PROVIDER_SITE_OTHER): Payer: Medicare Other | Admitting: Podiatry

## 2022-05-22 DIAGNOSIS — M79675 Pain in left toe(s): Secondary | ICD-10-CM | POA: Diagnosis not present

## 2022-05-22 DIAGNOSIS — E1121 Type 2 diabetes mellitus with diabetic nephropathy: Secondary | ICD-10-CM

## 2022-05-22 DIAGNOSIS — B351 Tinea unguium: Secondary | ICD-10-CM | POA: Diagnosis not present

## 2022-05-22 DIAGNOSIS — M79674 Pain in right toe(s): Secondary | ICD-10-CM | POA: Diagnosis not present

## 2022-05-22 NOTE — Progress Notes (Signed)
This patient returns to my office for at risk foot care.  This patient requires this care by a professional since this patient will be at risk due to having chronic kidney disease and type 2 diabetes.  This patient is unable to cut nails herself since the patient cannot reach her nails.These nails are painful walking and wearing shoes.  This patient presents for at risk foot care today.  General Appearance  Alert, conversant and in no acute stress.  Vascular  Dorsalis pedis and posterior tibial  pulses are palpable  bilaterally.  Capillary return is within normal limits  bilaterally. Temperature is within normal limits  bilaterally.  Neurologic  Senn-Weinstein monofilament wire test within normal limits  bilaterally. Muscle power within normal limits bilaterally.  Nails Thick disfigured discolored nails with subungual debris  from hallux to fifth toes bilaterally. No evidence of bacterial infection or drainage bilaterally.  Orthopedic  No limitations of motion  feet .  No crepitus or effusions noted.  No bony pathology or digital deformities noted.  HAV  B/L.  Skin  normotropic skin with no porokeratosis noted bilaterally.  No signs of infections or ulcers noted.     Onychomycosis  Pain in right toes  Pain in left toes  Consent was obtained for treatment procedures.   Mechanical debridement of nails 1-5  bilaterally performed with a nail nipper.  Filed with dremel without incident.    Return office visit   9  weeks                   Told patient to return for periodic foot care and evaluation due to potential at risk complications.   Helane Gunther DPM

## 2022-07-31 ENCOUNTER — Ambulatory Visit: Payer: Medicare Other | Admitting: Podiatry

## 2022-07-31 ENCOUNTER — Encounter: Payer: Self-pay | Admitting: Podiatry

## 2022-07-31 DIAGNOSIS — M79674 Pain in right toe(s): Secondary | ICD-10-CM

## 2022-07-31 DIAGNOSIS — M79675 Pain in left toe(s): Secondary | ICD-10-CM | POA: Diagnosis not present

## 2022-07-31 DIAGNOSIS — B351 Tinea unguium: Secondary | ICD-10-CM | POA: Diagnosis not present

## 2022-07-31 DIAGNOSIS — E1121 Type 2 diabetes mellitus with diabetic nephropathy: Secondary | ICD-10-CM | POA: Diagnosis not present

## 2022-07-31 NOTE — Progress Notes (Signed)
This patient returns to my office for at risk foot care.  This patient requires this care by a professional since this patient will be at risk due to having chronic kidney disease and type 2 diabetes.  This patient is unable to cut nails herself since the patient cannot reach her nails.These nails are painful walking and wearing shoes.  This patient presents for at risk foot care today.  General Appearance  Alert, conversant and in no acute stress.  Vascular  Dorsalis pedis and posterior tibial  pulses are palpable  bilaterally.  Capillary return is within normal limits  bilaterally. Temperature is within normal limits  bilaterally.  Neurologic  Senn-Weinstein monofilament wire test within normal limits  bilaterally. Muscle power within normal limits bilaterally.  Nails Thick disfigured discolored nails with subungual debris  from hallux to fifth toes bilaterally. No evidence of bacterial infection or drainage bilaterally.  Orthopedic  No limitations of motion  feet .  No crepitus or effusions noted.  No bony pathology or digital deformities noted.  HAV  B/L.  Skin  normotropic skin with no porokeratosis noted bilaterally.  No signs of infections or ulcers noted.     Onychomycosis  Pain in right toes  Pain in left toes  Consent was obtained for treatment procedures.   Mechanical debridement of nails 1-5  bilaterally performed with a nail nipper.  Filed with dremel without incident.    Return office visit   9  weeks                   Told patient to return for periodic foot care and evaluation due to potential at risk complications.   Alonah Lineback DPM  

## 2022-09-26 ENCOUNTER — Inpatient Hospital Stay: Payer: Medicare Other | Attending: Hematology and Oncology

## 2022-09-26 ENCOUNTER — Encounter: Payer: Self-pay | Admitting: Hematology and Oncology

## 2022-09-26 ENCOUNTER — Inpatient Hospital Stay (HOSPITAL_BASED_OUTPATIENT_CLINIC_OR_DEPARTMENT_OTHER): Payer: Medicare Other | Admitting: Hematology and Oncology

## 2022-09-26 ENCOUNTER — Inpatient Hospital Stay: Payer: Medicare Other

## 2022-09-26 VITALS — BP 139/47 | HR 92 | Temp 97.7°F | Resp 18 | Ht 64.0 in | Wt 150.4 lb

## 2022-09-26 DIAGNOSIS — E1121 Type 2 diabetes mellitus with diabetic nephropathy: Secondary | ICD-10-CM

## 2022-09-26 DIAGNOSIS — D630 Anemia in neoplastic disease: Secondary | ICD-10-CM

## 2022-09-26 DIAGNOSIS — Z8572 Personal history of non-Hodgkin lymphomas: Secondary | ICD-10-CM | POA: Diagnosis present

## 2022-09-26 DIAGNOSIS — D539 Nutritional anemia, unspecified: Secondary | ICD-10-CM

## 2022-09-26 DIAGNOSIS — E1165 Type 2 diabetes mellitus with hyperglycemia: Secondary | ICD-10-CM | POA: Insufficient documentation

## 2022-09-26 DIAGNOSIS — C8580 Other specified types of non-Hodgkin lymphoma, unspecified site: Secondary | ICD-10-CM

## 2022-09-26 DIAGNOSIS — E1122 Type 2 diabetes mellitus with diabetic chronic kidney disease: Secondary | ICD-10-CM | POA: Diagnosis not present

## 2022-09-26 DIAGNOSIS — N183 Chronic kidney disease, stage 3 unspecified: Secondary | ICD-10-CM | POA: Diagnosis not present

## 2022-09-26 DIAGNOSIS — I129 Hypertensive chronic kidney disease with stage 1 through stage 4 chronic kidney disease, or unspecified chronic kidney disease: Secondary | ICD-10-CM | POA: Insufficient documentation

## 2022-09-26 DIAGNOSIS — N189 Chronic kidney disease, unspecified: Secondary | ICD-10-CM | POA: Diagnosis not present

## 2022-09-26 LAB — FERRITIN: Ferritin: 36 ng/mL (ref 11–307)

## 2022-09-26 LAB — CBC WITH DIFFERENTIAL/PLATELET
Abs Immature Granulocytes: 0.03 10*3/uL (ref 0.00–0.07)
Basophils Absolute: 0 10*3/uL (ref 0.0–0.1)
Basophils Relative: 1 %
Eosinophils Absolute: 0.1 10*3/uL (ref 0.0–0.5)
Eosinophils Relative: 2 %
HCT: 26.4 % — ABNORMAL LOW (ref 36.0–46.0)
Hemoglobin: 8.2 g/dL — ABNORMAL LOW (ref 12.0–15.0)
Immature Granulocytes: 1 %
Lymphocytes Relative: 20 %
Lymphs Abs: 1.3 10*3/uL (ref 0.7–4.0)
MCH: 22.6 pg — ABNORMAL LOW (ref 26.0–34.0)
MCHC: 31.1 g/dL (ref 30.0–36.0)
MCV: 72.7 fL — ABNORMAL LOW (ref 80.0–100.0)
Monocytes Absolute: 0.5 10*3/uL (ref 0.1–1.0)
Monocytes Relative: 8 %
Neutro Abs: 4.4 10*3/uL (ref 1.7–7.7)
Neutrophils Relative %: 68 %
Platelets: 248 10*3/uL (ref 150–400)
RBC: 3.63 MIL/uL — ABNORMAL LOW (ref 3.87–5.11)
RDW: 16.4 % — ABNORMAL HIGH (ref 11.5–15.5)
WBC: 6.4 10*3/uL (ref 4.0–10.5)
nRBC: 0 % (ref 0.0–0.2)

## 2022-09-26 LAB — COMPREHENSIVE METABOLIC PANEL
ALT: 12 U/L (ref 0–44)
AST: 11 U/L — ABNORMAL LOW (ref 15–41)
Albumin: 3.7 g/dL (ref 3.5–5.0)
Alkaline Phosphatase: 99 U/L (ref 38–126)
Anion gap: 8 (ref 5–15)
BUN: 27 mg/dL — ABNORMAL HIGH (ref 8–23)
CO2: 27 mmol/L (ref 22–32)
Calcium: 9.2 mg/dL (ref 8.9–10.3)
Chloride: 100 mmol/L (ref 98–111)
Creatinine, Ser: 1.54 mg/dL — ABNORMAL HIGH (ref 0.44–1.00)
GFR, Estimated: 32 mL/min — ABNORMAL LOW (ref >60)
Glucose, Bld: 287 mg/dL — ABNORMAL HIGH (ref 70–99)
Potassium: 4.2 mmol/L (ref 3.5–5.1)
Sodium: 135 mmol/L (ref 135–145)
Total Bilirubin: 0.4 mg/dL (ref 0.3–1.2)
Total Protein: 6.6 g/dL (ref 6.5–8.1)

## 2022-09-26 LAB — VITAMIN B12: Vitamin B-12: 672 pg/mL (ref 180–914)

## 2022-09-26 LAB — IRON AND IRON BINDING CAPACITY (CC-WL,HP ONLY)
Iron: 15 ug/dL — ABNORMAL LOW (ref 28–170)
Saturation Ratios: 4 % — ABNORMAL LOW (ref 10.4–31.8)
TIBC: 347 ug/dL (ref 250–450)
UIBC: 332 ug/dL

## 2022-09-26 LAB — RETICULOCYTES
Immature Retic Fract: 23.4 % — ABNORMAL HIGH (ref 2.3–15.9)
RBC.: 3.7 MIL/uL — ABNORMAL LOW (ref 3.87–5.11)
Retic Count, Absolute: 72.5 10*3/uL (ref 19.0–186.0)
Retic Ct Pct: 2 % (ref 0.4–3.1)

## 2022-09-26 LAB — SEDIMENTATION RATE: Sed Rate: 70 mm/hr — ABNORMAL HIGH (ref 0–22)

## 2022-09-26 LAB — LACTATE DEHYDROGENASE: LDH: 153 U/L (ref 98–192)

## 2022-09-26 NOTE — Assessment & Plan Note (Signed)
She has intermittent uncontrolled diabetes She will continue to monitor her blood sugar carefully  We discussed risk factor modification

## 2022-09-26 NOTE — Assessment & Plan Note (Signed)
I am concerned about cancer recurrence The last time she become anemic, it was associated with cancer recurrence I recommend additional labs to evaluate for her anemia If they are unrevealing, I would recommend CT imaging to assess Will call her with test results

## 2022-09-26 NOTE — Assessment & Plan Note (Signed)
Suspect her anemia is associated with cancer recurrence With the microscopic picture, I will order iron studies Due to her medications, I will actually order B12 and other additional labs to assess and I will call her with test results tomorrow

## 2022-09-26 NOTE — Progress Notes (Signed)
[facility] OFFICE PROGRESS NOTE  Patient Care Team: [facility] as PCP - [name], MD as Consulting Physician (Ophthalmology) Nils Flack, MD as Referring Physician (Dermatology) Artis Delay, MD as Consulting Physician (Hematology and Oncology) Juanell Fairly, RN as Case Manager  ASSESSMENT & PLAN:  Marginal zone lymphoma I am concerned about cancer recurrence The last time she become anemic, it was associated with cancer recurrence I recommend additional labs to evaluate for her anemia If they are unrevealing, I would recommend CT imaging to assess Will call her with test results  Anemia in neoplastic disease Suspect her anemia is associated with cancer recurrence With the microscopic picture, I will order iron studies Due to her medications, I will actually order B12 and other additional labs to assess and I will call her with test results tomorrow  Chronic kidney disease (CKD), stage III (moderate) He has worsening renal failure likely due to dehydration and uncontrolled diabetes Monitor closely  Type 2 diabetes mellitus with diabetic nephropathy She has intermittent uncontrolled diabetes She will continue to monitor her blood sugar carefully  We discussed risk factor modification  Orders Placed This Encounter  Procedures   Ferritin    Standing Status:   Future    Number of Occurrences:   1    Standing Expiration Date:   09/26/2023   Vitamin B12    Standing Status:   Future    Number of Occurrences:   1    Standing Expiration Date:   09/26/2023   Reticulocytes    Standing Status:   Future    Number of Occurrences:   1    Standing Expiration Date:   09/26/2023   Sedimentation rate    Standing Status:   Future    Number of Occurrences:   1    Standing Expiration Date:   09/26/2023   Lactate dehydrogenase    Standing Status:   Future    Number of Occurrences:   1    Standing Expiration Date:   09/26/2023   Iron  and Iron Binding Capacity ([facility] only)    Standing Status:   Future    Number of Occurrences:   1    Standing Expiration Date:   09/26/2023   Erythropoietin    Standing Status:   Future    Number of Occurrences:   1    Standing Expiration Date:   09/26/2023    All questions were answered. The patient knows to call the clinic with any problems, questions or concerns. The total time spent in the appointment was 30 minutes encounter with patients including review of chart and various tests results, discussions about plan of care and coordination of care plan   Artis Delay, MD 09/26/2022 1:31 PM  INTERVAL HISTORY: Please see below for problem oriented charting. she returns for surveillance follow-up for history of recurrent lymphoma Since last time I saw her, she feels well She denies dizziness, headache or shortness of breath I am concerned about abnormal test results today She denies B symptoms such as night sweats or weight loss The patient denies any recent signs or symptoms of bleeding such as spontaneous epistaxis, hematuria or hematochezia.   REVIEW OF SYSTEMS:   Constitutional: Denies fevers, chills or abnormal weight loss Eyes: Denies blurriness of vision Ears, nose, mouth, throat, and face: Denies mucositis or sore throat Respiratory: Denies cough, dyspnea or wheezes Cardiovascular: Denies palpitation, chest discomfort or lower extremity swelling Gastrointestinal:  Denies nausea, heartburn or  change in bowel habits Skin: Denies abnormal skin rashes Lymphatics: Denies new lymphadenopathy or easy bruising Neurological:Denies numbness, tingling or new weaknesses Behavioral/Psych: Mood is stable, no new changes  All other systems were reviewed with the patient and are negative.  I have reviewed the past medical history, past surgical history, social history and family history with the patient and they are unchanged from previous note.  ALLERGIES:  is allergic to aspirin,  capoten [captopril], glyburide, insulin detemir, other, saxagliptin, statins, and zocor [simvastatin].  MEDICATIONS:  Current Outpatient Medications  Medication Sig Dispense Refill   JARDIANCE 10 MG TABS tablet Take 10 mg by mouth daily.     Accu-Chek FastClix Lancets MISC USE 1 TO CHECK GLUCOSE ONCE DAILY     acyclovir (ZOVIRAX) 400 MG tablet Take 1 tablet by mouth once daily 30 tablet 11   alendronate (FOSAMAX) 70 MG tablet Take 70 mg by mouth once a week.     amLODipine (NORVASC) 10 MG tablet TAKE 1 TABLET BY MOUTH AT BEDTIME 90 tablet 0   amLODipine (NORVASC) 5 MG tablet Take 5 mg by mouth daily.     aspirin 81 MG tablet Take 40.5 mg by mouth 3 (three) times a week.     Blood Glucose Monitoring Suppl (ACCU-CHEK GUIDE) w/Device KIT See admin instructions.     calcium carbonate (OSCAL) 1500 (600 Ca) MG TABS tablet 1 tablet with meals     cholecalciferol (VITAMIN D) 1000 UNITS tablet Take 1,000 Units by mouth daily.      enalapril (VASOTEC) 20 MG tablet Take 20 mg by mouth every morning.     ezetimibe (ZETIA) 10 MG tablet Take 1 tablet by mouth daily.     fish oil-omega-3 fatty acids 1000 MG capsule Take 1 g by mouth daily.      glimepiride (AMARYL) 4 MG tablet Take 4 mg by mouth daily with breakfast. Per patient takes in am and pm.     glucose blood test strip Use as instructed 100 each 12   lidocaine-prilocaine (EMLA) cream Apply 1 application topically as needed. 30 g 3   Magnesium 500 MG CAPS Take 500 mg by mouth daily.      metFORMIN (GLUCOPHAGE-XR) 500 MG 24 hr tablet SMARTSIG:1 Tablet(s) By Mouth Morning-Evening     Misc Natural Products (LUTEIN 20 PO) Take by mouth daily.     Multiple Vitamins-Minerals (CENTRUM CARDIO) TABS Take 1 tablet by mouth daily.     Multiple Vitamins-Minerals (ICAPS) CAPS Take by mouth daily.     No current facility-administered medications for this visit.    SUMMARY OF ONCOLOGIC HISTORY: Oncology History Overview Note  Marginal zone lymphoma, with  lymph node and bone marrow involvement along with splenomegaly   Primary site: Lymphoid Neoplasms   Staging method: AJCC 6th Edition   Clinical: Stage IV signed by Artis Delay, MD on 09/02/2013  7:38 PM   Summary: Stage IV     Marginal zone lymphoma (HCC)  07/29/2013 Imaging   CT scan shows splenomegaly and abnormal lymphadenopathy.   08/13/2013 Imaging   PET CT scan confirmed lymphadenopathy and splenomegaly, those areas are PET avid   08/23/2013 Surgery   [id number] left axillary lymph node biopsy confirmed a low-grade lymphoma.   08/27/2013 Bone Marrow Biopsy   Bone marrow biopsy confirmed lymphoma.[id number]   09/08/2013 - 09/29/2013 Chemotherapy   She completed 4 cycles of rituximab with resolution of splenomegaly.   12/29/2013 Imaging   PET scan show complete response.   07/11/2014 Imaging  PET scan showed recurrent disease   07/19/2014 - 08/09/2014 Chemotherapy   She completed 4 cycles of rituximab with resolution of splenomegaly   10/12/2014 Imaging   Repeat PET/CT scan showed near complete response to treatment.   10/13/2014 - 02/09/2015 Chemotherapy   She is placed on rituximab maintenance therapy.   03/29/2015 Imaging   PET CT showed possible mild progression of splenomegaly   04/26/2015 - 02/22/2016 Chemotherapy   She started taking Ibrutinib   04/28/2015 Adverse Reaction   Treatment was placed temporarily on hold due to severe diarrhea and resumed at reduced dose   07/03/2015 PET scan   No hypermetabolic adenopathy in the neck, chest, abdomen or pelvis.2. Spleen is not hypermetabolic and appears slightly less prominent size.   02/06/2016 PET scan   Hypermetabolic LEFT axillary lymph nodes. Concern for HIGH-GRADE LYMPHOMA LOCALIZED RECURRENCE. 2. Prominent spleen without hypermetabolic activity. No change in size from comparison. 3. No additional hypermetabolic adenopathy on scan.   02/19/2016 Procedure   Technically successful ultrasound-guided core biopsy, left axillary  adenopathy.   02/19/2016 Pathology Results   Lymph node, needle/core biopsy, left axilla - ATYPICAL LYMPHOID PROLIFERATION SUSPICIOUS FOR LYMPHOMA. - SEE COMMENT. Microscopic Comment The sections show multiple very small and skinny fragments of lymph nodal tissue displaying areas of lymphoid expansion by a population of small to medium size lymphocytes with round to irregular nuclei, scanty to moderately abundant cytoplasm, dense chromatin and inconspicuous nucleoli. This is associated with scattering of "naked" germinal centers with abundant tingible body macrophages. To further evaluate this process, flow cytometric analysis was performed and shows a minor population of monoclonal B cells expressing pan B-cell antigens including CD20 with associated kappa light chain restriction. In addition, immunohistochemical stains were performed including CD20, CD79a, CD3, CD5, CD10, BCL-2, CD21 with appropriate controls. The stains show a mixture of T and B cells with slight predominance of B cells. CD21 highlights numerous areas with dendritic networks, correlating with the presence of germinal centers. CD10 shows focal positivity likely in germinal centers. BCL-2 is diffusely positive except in areas of apparent germinal centers. The overall features are atypical and suspicious for involvement by a B-cell lymphoproliferative process, particularly given the B-cell monoclonality by flow cytometry. However, morphologic evaluation is extremely limited due to small biopsy fragments and additional material (excisional biopsy) is strongly recommended for further evaluation. (BNS:ecj 02/21/2016)    03/04/2016 Procedure   Placement of single lumen port a cath via right internal jugular vein. The catheter tip lies at the cavo-atrial junction. A power injectable port a cath was placed and is ready for immediate use.   03/07/2016 - 05/31/2016 Chemotherapy   She received treatment with bendamustine and Gazyva   05/27/2016 PET  scan   1. No evidence of active lymphoma on whole-body scan. 2. Resolution of metabolic activity previously identified in the LEFT axilla. 3. Small amount free fluid the pelvis.   03/07/2017 PET scan   1. No findings of active lymphoma in the chest, abdomen, or pelvis. 2. Other imaging findings of potential clinical significance: Aortic Atherosclerosis (ICD10-I70.0). Coronary atherosclerosis. Chronic paranasal sinusitis. Moderately distended gallbladder, chronic   05/02/2020 Procedure   Removal of implanted Port-A-Cath utilizing sharp and blunt dissection. The procedure was uncomplicated.   09/24/2021 Imaging   1. No adenopathy above or below the diaphragm and no splenomegaly and no suspicious soft tissue or osseous lesion identified to suggest lymphomatous disease recurrence. 2. Small hiatal hernia with symmetric distal esophageal wall thickening and reflux/retained contrast in the distal  esophagus suggestive of reflux esophagitis consider further evaluation with endoscopy if clinically indicated. 3. Distended gallbladder without CT findings of acute cholecystitis, commonly reflects fasting state, suggest correlation with right upper quadrant tenderness to palpation. 4. Moderate volume of formed stool throughout the colon as can be seen with constipation. 5.  Aortic Atherosclerosis (ICD10-I70.0).     PHYSICAL EXAMINATION: ECOG PERFORMANCE STATUS: 0 - Asymptomatic  Vitals:   09/26/22 1255  BP: (!) 139/47  Pulse: 92  Resp: 18  Temp: 97.7 F (36.5 C)  SpO2: 100%   Filed Weights   09/26/22 1255  Weight: 150 lb 6.4 oz (68.2 kg)    GENERAL:alert, no distress and comfortable SKIN: skin color, texture, turgor are normal, no rashes or significant lesions EYES: normal, Conjunctiva are pink and non-injected, sclera clear OROPHARYNX:no exudate, no erythema and lips, buccal mucosa, and tongue normal  NECK: supple, thyroid normal size, non-tender, without nodularity LYMPH:  no palpable  lymphadenopathy in the cervical, axillary or inguinal LUNGS: clear to auscultation and percussion with normal breathing effort HEART: regular rate & rhythm and no murmurs and no lower extremity edema ABDOMEN:abdomen soft, non-tender and normal bowel sounds Musculoskeletal:no cyanosis of digits and no clubbing  NEURO: alert & oriented x 3 with fluent speech, no focal motor/sensory deficits  LABORATORY DATA:  I have reviewed the data as listed    Component Value Date/Time   NA 135 09/26/2022 1222   NA 136 01/07/2017 1004   K 4.2 09/26/2022 1222   K 4.5 01/07/2017 1004   CL 100 09/26/2022 1222   CO2 27 09/26/2022 1222   CO2 23 01/07/2017 1004   GLUCOSE 287 (H) 09/26/2022 1222   GLUCOSE 217 (H) 01/07/2017 1004   BUN 27 (H) 09/26/2022 1222   BUN 23.3 01/07/2017 1004   CREATININE 1.54 (H) 09/26/2022 1222   CREATININE 1.37 (H) 07/06/2018 0959   CREATININE 1.2 (H) 01/07/2017 1004   CALCIUM 9.2 09/26/2022 1222   CALCIUM 9.4 01/07/2017 1004   PROT 6.6 09/26/2022 1222   PROT 6.7 01/07/2017 1004   ALBUMIN 3.7 09/26/2022 1222   ALBUMIN 3.9 01/07/2017 1004   AST 11 (L) 09/26/2022 1222   AST 15 07/06/2018 0959   AST 20 01/07/2017 1004   ALT 12 09/26/2022 1222   ALT 10 07/06/2018 0959   ALT 14 01/07/2017 1004   ALKPHOS 99 09/26/2022 1222   ALKPHOS 70 01/07/2017 1004   BILITOT 0.4 09/26/2022 1222   BILITOT 0.4 07/06/2018 0959   BILITOT 0.49 01/07/2017 1004   GFRNONAA 32 (L) 09/26/2022 1222   GFRNONAA 36 (L) 07/06/2018 0959   GFRNONAA 47 (L) 05/18/2013 1256   GFRAA 40 (L) 09/28/2019 1053   GFRAA 41 (L) 07/06/2018 0959   GFRAA 55 (L) 05/18/2013 1256    No results found for: "SPEP", "UPEP"  Lab Results  Component Value Date   WBC 6.4 09/26/2022   NEUTROABS 4.4 09/26/2022   HGB 8.2 (L) 09/26/2022   HCT 26.4 (L) 09/26/2022   MCV 72.7 (L) 09/26/2022   PLT 248 09/26/2022      Chemistry      Component Value Date/Time   NA 135 09/26/2022 1222   NA 136 01/07/2017 1004   K 4.2  09/26/2022 1222   K 4.5 01/07/2017 1004   CL 100 09/26/2022 1222   CO2 27 09/26/2022 1222   CO2 23 01/07/2017 1004   BUN 27 (H) 09/26/2022 1222   BUN 23.3 01/07/2017 1004   CREATININE 1.54 (H) 09/26/2022 1222  CREATININE 1.37 (H) 07/06/2018 0959   CREATININE 1.2 (H) 01/07/2017 1004   GLU 202 (H) 08/09/2014 1000      Component Value Date/Time   CALCIUM 9.2 09/26/2022 1222   CALCIUM 9.4 01/07/2017 1004   ALKPHOS 99 09/26/2022 1222   ALKPHOS 70 01/07/2017 1004   AST 11 (L) 09/26/2022 1222   AST 15 07/06/2018 0959   AST 20 01/07/2017 1004   ALT 12 09/26/2022 1222   ALT 10 07/06/2018 0959   ALT 14 01/07/2017 1004   BILITOT 0.4 09/26/2022 1222   BILITOT 0.4 07/06/2018 0959   BILITOT 0.49 01/07/2017 1004       RADIOGRAPHIC STUDIES: I have personally reviewed the radiological images as listed and agreed with the findings in the report. No results found.

## 2022-09-26 NOTE — Assessment & Plan Note (Signed)
He has worsening renal failure likely due to dehydration and uncontrolled diabetes Monitor closely

## 2022-09-27 ENCOUNTER — Telehealth: Payer: Self-pay | Admitting: *Deleted

## 2022-09-27 NOTE — Telephone Encounter (Addendum)
-----   Message from Artis Delay sent at 09/27/2022 11:07 AM EDT ----- Pls let her know she has iron def anemia Since she is going out of town, I recommend she takes OTC iron daily 30 mins before dinner until she returns Ask when she is coming back so I can schedule rtn visit appt  Contacted patient with information in message above from Dr. Bertis Ruddy. \ Patient verbalized understanding. Encouraged her to take iron with source of Vit C to increase absorption. Advised her that iron may be constipating. Encouraged fluids and can take Miralax as directed if needed.  Ms. Wachtel stated she will contact Dr. 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 She said it will be in likely be in 2-4 weeks.

## 2022-09-28 LAB — ERYTHROPOIETIN: Erythropoietin: 54.9 m[IU]/mL — ABNORMAL HIGH (ref 2.6–18.5)

## 2022-10-02 ENCOUNTER — Encounter: Payer: Self-pay | Admitting: Podiatry

## 2022-10-02 ENCOUNTER — Ambulatory Visit (INDEPENDENT_AMBULATORY_CARE_PROVIDER_SITE_OTHER): Payer: Medicare Other | Admitting: Podiatry

## 2022-10-02 DIAGNOSIS — M79674 Pain in right toe(s): Secondary | ICD-10-CM

## 2022-10-02 DIAGNOSIS — E1121 Type 2 diabetes mellitus with diabetic nephropathy: Secondary | ICD-10-CM

## 2022-10-02 DIAGNOSIS — M79675 Pain in left toe(s): Secondary | ICD-10-CM

## 2022-10-02 DIAGNOSIS — B351 Tinea unguium: Secondary | ICD-10-CM | POA: Diagnosis not present

## 2022-10-02 NOTE — Progress Notes (Signed)
This patient returns to my office for at risk foot care.  This patient requires this care by a professional since this patient will be at risk due to having chronic kidney disease and type 2 diabetes.  This patient is unable to cut nails herself since the patient cannot reach her nails.These nails are painful walking and wearing shoes.  This patient presents for at risk foot care today.  General Appearance  Alert, conversant and in no acute stress.  Vascular  Dorsalis pedis and posterior tibial  pulses are palpable  bilaterally.  Capillary return is within normal limits  bilaterally. Temperature is within normal limits  bilaterally.  Neurologic  Senn-Weinstein monofilament wire test within normal limits  bilaterally. Muscle power within normal limits bilaterally.  Nails Thick disfigured discolored nails with subungual debris  from hallux to fifth toes bilaterally. No evidence of bacterial infection or drainage bilaterally.  Orthopedic  No limitations of motion  feet .  No crepitus or effusions noted.  No bony pathology or digital deformities noted.  HAV  B/L.  Skin  normotropic skin with no porokeratosis noted bilaterally.  No signs of infections or ulcers noted.     Onychomycosis  Pain in right toes  Pain in left toes  Consent was obtained for treatment procedures.   Mechanical debridement of nails 1-5  bilaterally performed with a nail nipper.  Filed with dremel without incident.    Return office visit   9  weeks                   Told patient to return for periodic foot care and evaluation due to potential at risk complications.   Gregory Mayer DPM  

## 2022-10-14 ENCOUNTER — Telehealth: Payer: Self-pay

## 2022-10-14 NOTE — Telephone Encounter (Signed)
Returned her call about coming home tomorrow from a trip. Scheduled appt on 9/20 1:15 pm for lab and see Dr. Bertis Ruddy at 2 pm. Ask her to call the office for questions.

## 2022-10-18 ENCOUNTER — Inpatient Hospital Stay: Payer: Medicare Other | Attending: Hematology and Oncology | Admitting: Hematology and Oncology

## 2022-10-18 ENCOUNTER — Inpatient Hospital Stay: Payer: Medicare Other

## 2022-10-18 ENCOUNTER — Other Ambulatory Visit: Payer: Self-pay | Admitting: Hematology and Oncology

## 2022-10-18 ENCOUNTER — Encounter: Payer: Self-pay | Admitting: Hematology and Oncology

## 2022-10-18 VITALS — BP 154/55 | HR 85 | Temp 98.0°F | Resp 18 | Ht 64.0 in | Wt 149.8 lb

## 2022-10-18 DIAGNOSIS — E1121 Type 2 diabetes mellitus with diabetic nephropathy: Secondary | ICD-10-CM | POA: Diagnosis not present

## 2022-10-18 DIAGNOSIS — K5900 Constipation, unspecified: Secondary | ICD-10-CM | POA: Diagnosis not present

## 2022-10-18 DIAGNOSIS — E1122 Type 2 diabetes mellitus with diabetic chronic kidney disease: Secondary | ICD-10-CM | POA: Insufficient documentation

## 2022-10-18 DIAGNOSIS — D630 Anemia in neoplastic disease: Secondary | ICD-10-CM | POA: Insufficient documentation

## 2022-10-18 DIAGNOSIS — D509 Iron deficiency anemia, unspecified: Secondary | ICD-10-CM | POA: Insufficient documentation

## 2022-10-18 DIAGNOSIS — Z8572 Personal history of non-Hodgkin lymphomas: Secondary | ICD-10-CM | POA: Diagnosis present

## 2022-10-18 DIAGNOSIS — K259 Gastric ulcer, unspecified as acute or chronic, without hemorrhage or perforation: Secondary | ICD-10-CM | POA: Insufficient documentation

## 2022-10-18 DIAGNOSIS — C8580 Other specified types of non-Hodgkin lymphoma, unspecified site: Secondary | ICD-10-CM

## 2022-10-18 DIAGNOSIS — K209 Esophagitis, unspecified without bleeding: Secondary | ICD-10-CM | POA: Insufficient documentation

## 2022-10-18 DIAGNOSIS — N183 Chronic kidney disease, stage 3 unspecified: Secondary | ICD-10-CM | POA: Diagnosis not present

## 2022-10-18 DIAGNOSIS — D631 Anemia in chronic kidney disease: Secondary | ICD-10-CM | POA: Diagnosis not present

## 2022-10-18 LAB — CBC WITH DIFFERENTIAL (CANCER CENTER ONLY)
Abs Immature Granulocytes: 0.03 10*3/uL (ref 0.00–0.07)
Basophils Absolute: 0 10*3/uL (ref 0.0–0.1)
Basophils Relative: 1 %
Eosinophils Absolute: 0.2 10*3/uL (ref 0.0–0.5)
Eosinophils Relative: 3 %
HCT: 26.1 % — ABNORMAL LOW (ref 36.0–46.0)
Hemoglobin: 8 g/dL — ABNORMAL LOW (ref 12.0–15.0)
Immature Granulocytes: 1 %
Lymphocytes Relative: 19 %
Lymphs Abs: 1.2 10*3/uL (ref 0.7–4.0)
MCH: 22.5 pg — ABNORMAL LOW (ref 26.0–34.0)
MCHC: 30.7 g/dL (ref 30.0–36.0)
MCV: 73.3 fL — ABNORMAL LOW (ref 80.0–100.0)
Monocytes Absolute: 0.5 10*3/uL (ref 0.1–1.0)
Monocytes Relative: 7 %
Neutro Abs: 4.5 10*3/uL (ref 1.7–7.7)
Neutrophils Relative %: 69 %
Platelet Count: 238 10*3/uL (ref 150–400)
RBC: 3.56 MIL/uL — ABNORMAL LOW (ref 3.87–5.11)
RDW: 17.3 % — ABNORMAL HIGH (ref 11.5–15.5)
WBC Count: 6.4 10*3/uL (ref 4.0–10.5)
nRBC: 0 % (ref 0.0–0.2)

## 2022-10-18 LAB — CMP (CANCER CENTER ONLY)
ALT: 12 U/L (ref 0–44)
AST: 11 U/L — ABNORMAL LOW (ref 15–41)
Albumin: 3.6 g/dL (ref 3.5–5.0)
Alkaline Phosphatase: 96 U/L (ref 38–126)
Anion gap: 9 (ref 5–15)
BUN: 23 mg/dL (ref 8–23)
CO2: 25 mmol/L (ref 22–32)
Calcium: 8.9 mg/dL (ref 8.9–10.3)
Chloride: 102 mmol/L (ref 98–111)
Creatinine: 1.61 mg/dL — ABNORMAL HIGH (ref 0.44–1.00)
GFR, Estimated: 31 mL/min — ABNORMAL LOW (ref >60)
Glucose, Bld: 253 mg/dL — ABNORMAL HIGH (ref 70–99)
Potassium: 4.1 mmol/L (ref 3.5–5.1)
Sodium: 136 mmol/L (ref 135–145)
Total Bilirubin: 0.4 mg/dL (ref 0.3–1.2)
Total Protein: 6.5 g/dL (ref 6.5–8.1)

## 2022-10-18 LAB — FERRITIN: Ferritin: 30 ng/mL (ref 11–307)

## 2022-10-18 NOTE — Assessment & Plan Note (Signed)
She has intermittent uncontrolled diabetes She will continue to monitor her blood sugar carefully  We discussed risk factor modification

## 2022-10-18 NOTE — Assessment & Plan Note (Signed)
She has worsening anemia I am concerned about recurrent cancer Recommend CT imaging next week for further evaluation

## 2022-10-18 NOTE — Assessment & Plan Note (Signed)
She has worsening kidney failure We discussed importance of dietary modification, medication management and oral fluid hydration

## 2022-10-18 NOTE — Progress Notes (Signed)
[facility] OFFICE PROGRESS NOTE  Patient Care Team: [facility] as PCP - [name], MD as Consulting Physician (Ophthalmology) Nils Flack, MD as Referring Physician (Dermatology) Artis Delay, MD as Consulting Physician (Hematology and Oncology) Juanell Fairly, RN as Case Manager  ASSESSMENT & PLAN:  Marginal zone lymphoma She has worsening anemia I am concerned about recurrent cancer Recommend CT imaging next week for further evaluation  Anemia in neoplastic disease She has multifactorial anemia, combination of anemia chronic kidney disease, from diabetes and iron deficiency She has failed oral iron treatment and did not tolerated The most likely cause of her anemia is due to chronic blood loss/malabsorption syndrome. We discussed some of the risks, benefits, and alternatives of intravenous iron infusions. The patient is symptomatic from anemia and the iron level is critically low. She tolerated oral iron supplement poorly and desires to achieved higher levels of iron faster for adequate hematopoesis. Some of the side-effects to be expected including risks of infusion reactions, phlebitis, headaches, nausea and fatigue.  The patient is willing to proceed. Patient education material was dispensed.  Goal is to keep ferritin level greater than 50 and resolution of anemia   Chronic kidney disease (CKD), stage III (moderate) She has worsening kidney failure We discussed importance of dietary modification, medication management and oral fluid hydration  Type 2 diabetes mellitus with diabetic nephropathy She has intermittent uncontrolled diabetes She will continue to monitor her blood sugar carefully  We discussed risk factor modification  Orders Placed This Encounter  Procedures   CT CHEST ABDOMEN PELVIS WO CONTRAST    Standing Status:   Future    Standing Expiration Date:   10/18/2023    Order Specific Question:   Preferred  imaging location?    Answer:   [facility]    Order Specific Question:   If indicated for the ordered procedure, I authorize the administration of oral contrast media per Radiology protocol    Answer:   Yes    Order Specific Question:   Does the patient have a contrast media/X-ray dye allergy?    Answer:   No   CBC with Differential (Cancer Center Only)    Standing Status:   Future    Standing Expiration Date:   10/18/2023   CMP (Cancer Center only)    Standing Status:   Future    Standing Expiration Date:   10/18/2023   ABO/Rh    Standing Status:   Future    Standing Expiration Date:   10/18/2023   Sample to Blood Bank    Standing Status:   Standing    Number of Occurrences:   33    Standing Expiration Date:   10/18/2023    All questions were answered. The patient knows to call the clinic with any problems, questions or concerns. The total time spent in the appointment was 40 minutes encounter with patients including review of chart and various tests results, discussions about plan of care and coordination of care plan   Artis Delay, MD 10/18/2022 2:58 PM  INTERVAL HISTORY: Please see below for problem oriented charting. she returns for further evaluation She did not tolerate oral iron supplement It caused excessive diarrhea Her energy level is poor She denies chest pain or shortness of breath The patient denies any recent signs or symptoms of bleeding such as spontaneous epistaxis, hematuria or hematochezia. We discussed test results and importance of additional workup She is in agreement for IV iron infusion  REVIEW OF SYSTEMS:   Constitutional: Denies fevers, chills or abnormal weight loss Eyes: Denies blurriness of vision Ears, nose, mouth, throat, and face: Denies mucositis or sore throat Respiratory: Denies cough, dyspnea or wheezes Cardiovascular: Denies palpitation, chest discomfort or lower extremity swelling  Lymphatics: Denies new lymphadenopathy or easy  bruising Neurological:Denies numbness, tingling or new weaknesses Behavioral/Psych: Mood is stable, no new changes  All other systems were reviewed with the patient and are negative.  I have reviewed the past medical history, past surgical history, social history and family history with the patient and they are unchanged from previous note.  ALLERGIES:  is allergic to aspirin, capoten [captopril], glyburide, insulin detemir, other, saxagliptin, statins, and zocor [simvastatin].  MEDICATIONS:  Current Outpatient Medications  Medication Sig Dispense Refill   Accu-Chek FastClix Lancets MISC USE 1 TO CHECK GLUCOSE ONCE DAILY     acyclovir (ZOVIRAX) 400 MG tablet Take 1 tablet by mouth once daily 30 tablet 11   alendronate (FOSAMAX) 70 MG tablet Take 70 mg by mouth once a week.     amLODipine (NORVASC) 10 MG tablet TAKE 1 TABLET BY MOUTH AT BEDTIME 90 tablet 0   amLODipine (NORVASC) 5 MG tablet Take 5 mg by mouth daily.     aspirin 81 MG tablet Take 40.5 mg by mouth 3 (three) times a week.     Blood Glucose Monitoring Suppl (ACCU-CHEK GUIDE) w/Device KIT See admin instructions.     calcium carbonate (OSCAL) 1500 (600 Ca) MG TABS tablet 1 tablet with meals     cholecalciferol (VITAMIN D) 1000 UNITS tablet Take 1,000 Units by mouth daily.      enalapril (VASOTEC) 20 MG tablet Take 20 mg by mouth every morning.     ezetimibe (ZETIA) 10 MG tablet Take 1 tablet by mouth daily.     fish oil-omega-3 fatty acids 1000 MG capsule Take 1 g by mouth daily.      glimepiride (AMARYL) 4 MG tablet Take 4 mg by mouth daily with breakfast. Per patient takes in am and pm.     glucose blood test strip Use as instructed 100 each 12   JARDIANCE 10 MG TABS tablet Take 10 mg by mouth daily.     lidocaine-prilocaine (EMLA) cream Apply 1 application topically as needed. 30 g 3   Magnesium 500 MG CAPS Take 500 mg by mouth daily.      metFORMIN (GLUCOPHAGE-XR) 500 MG 24 hr tablet SMARTSIG:1 Tablet(s) By Mouth  Morning-Evening     Misc Natural Products (LUTEIN 20 PO) Take by mouth daily.     Multiple Vitamins-Minerals (CENTRUM CARDIO) TABS Take 1 tablet by mouth daily.     Multiple Vitamins-Minerals (ICAPS) CAPS Take by mouth daily.     No current facility-administered medications for this visit.    SUMMARY OF ONCOLOGIC HISTORY: Oncology History Overview Note  Marginal zone lymphoma, with lymph node and bone marrow involvement along with splenomegaly   Primary site: Lymphoid Neoplasms   Staging method: AJCC 6th Edition   Clinical: Stage IV signed by Artis Delay, MD on 09/02/2013  7:38 PM   Summary: Stage IV     Marginal zone lymphoma (HCC)  07/29/2013 Imaging   CT scan shows splenomegaly and abnormal lymphadenopathy.   08/13/2013 Imaging   PET CT scan confirmed lymphadenopathy and splenomegaly, those areas are PET avid   08/23/2013 Surgery   [id number] left axillary lymph node biopsy confirmed a low-grade lymphoma.   08/27/2013 Bone Marrow Biopsy   Bone marrow biopsy  confirmed lymphoma.[id number]   09/08/2013 - 09/29/2013 Chemotherapy   She completed 4 cycles of rituximab with resolution of splenomegaly.   12/29/2013 Imaging   PET scan show complete response.   07/11/2014 Imaging   PET scan showed recurrent disease   07/19/2014 - 08/09/2014 Chemotherapy   She completed 4 cycles of rituximab with resolution of splenomegaly   10/12/2014 Imaging   Repeat PET/CT scan showed near complete response to treatment.   10/13/2014 - 02/09/2015 Chemotherapy   She is placed on rituximab maintenance therapy.   03/29/2015 Imaging   PET CT showed possible mild progression of splenomegaly   04/26/2015 - 02/22/2016 Chemotherapy   She started taking Ibrutinib   04/28/2015 Adverse Reaction   Treatment was placed temporarily on hold due to severe diarrhea and resumed at reduced dose   07/03/2015 PET scan   No hypermetabolic adenopathy in the neck, chest, abdomen or pelvis.2. Spleen is not hypermetabolic and  appears slightly less prominent size.   02/06/2016 PET scan   Hypermetabolic LEFT axillary lymph nodes. Concern for HIGH-GRADE LYMPHOMA LOCALIZED RECURRENCE. 2. Prominent spleen without hypermetabolic activity. No change in size from comparison. 3. No additional hypermetabolic adenopathy on scan.   02/19/2016 Procedure   Technically successful ultrasound-guided core biopsy, left axillary adenopathy.   02/19/2016 Pathology Results   Lymph node, needle/core biopsy, left axilla - ATYPICAL LYMPHOID PROLIFERATION SUSPICIOUS FOR LYMPHOMA. - SEE COMMENT. Microscopic Comment The sections show multiple very small and skinny fragments of lymph nodal tissue displaying areas of lymphoid expansion by a population of small to medium size lymphocytes with round to irregular nuclei, scanty to moderately abundant cytoplasm, dense chromatin and inconspicuous nucleoli. This is associated with scattering of "naked" germinal centers with abundant tingible body macrophages. To further evaluate this process, flow cytometric analysis was performed and shows a minor population of monoclonal B cells expressing pan B-cell antigens including CD20 with associated kappa light chain restriction. In addition, immunohistochemical stains were performed including CD20, CD79a, CD3, CD5, CD10, BCL-2, CD21 with appropriate controls. The stains show a mixture of T and B cells with slight predominance of B cells. CD21 highlights numerous areas with dendritic networks, correlating with the presence of germinal centers. CD10 shows focal positivity likely in germinal centers. BCL-2 is diffusely positive except in areas of apparent germinal centers. The overall features are atypical and suspicious for involvement by a B-cell lymphoproliferative process, particularly given the B-cell monoclonality by flow cytometry. However, morphologic evaluation is extremely limited due to small biopsy fragments and additional material (excisional biopsy) is  strongly recommended for further evaluation. (BNS:ecj 02/21/2016)    03/04/2016 Procedure   Placement of single lumen port a cath via right internal jugular vein. The catheter tip lies at the cavo-atrial junction. A power injectable port a cath was placed and is ready for immediate use.   03/07/2016 - 05/31/2016 Chemotherapy   She received treatment with bendamustine and Gazyva   05/27/2016 PET scan   1. No evidence of active lymphoma on whole-body scan. 2. Resolution of metabolic activity previously identified in the LEFT axilla. 3. Small amount free fluid the pelvis.   03/07/2017 PET scan   1. No findings of active lymphoma in the chest, abdomen, or pelvis. 2. Other imaging findings of potential clinical significance: Aortic Atherosclerosis (ICD10-I70.0). Coronary atherosclerosis. Chronic paranasal sinusitis. Moderately distended gallbladder, chronic   05/02/2020 Procedure   Removal of implanted Port-A-Cath utilizing sharp and blunt dissection. The procedure was uncomplicated.   09/24/2021 Imaging   1. No adenopathy above  or below the diaphragm and no splenomegaly and no suspicious soft tissue or osseous lesion identified to suggest lymphomatous disease recurrence. 2. Small hiatal hernia with symmetric distal esophageal wall thickening and reflux/retained contrast in the distal esophagus suggestive of reflux esophagitis consider further evaluation with endoscopy if clinically indicated. 3. Distended gallbladder without CT findings of acute cholecystitis, commonly reflects fasting state, suggest correlation with right upper quadrant tenderness to palpation. 4. Moderate volume of formed stool throughout the colon as can be seen with constipation. 5.  Aortic Atherosclerosis (ICD10-I70.0).     PHYSICAL EXAMINATION: ECOG PERFORMANCE STATUS: 1 - Symptomatic but completely ambulatory  Vitals:   10/18/22 1346  BP: (!) 154/55  Pulse: 85  Resp: 18  Temp: 98 F (36.7 C)  SpO2: 100%   Filed Weights    10/18/22 1346  Weight: 149 lb 12.8 oz (67.9 kg)    GENERAL:alert, no distress and comfortable   LABORATORY DATA:  I have reviewed the data as listed    Component Value Date/Time   NA 136 10/18/2022 1341   NA 136 01/07/2017 1004   K 4.1 10/18/2022 1341   K 4.5 01/07/2017 1004   CL 102 10/18/2022 1341   CO2 25 10/18/2022 1341   CO2 23 01/07/2017 1004   GLUCOSE 253 (H) 10/18/2022 1341   GLUCOSE 217 (H) 01/07/2017 1004   BUN 23 10/18/2022 1341   BUN 23.3 01/07/2017 1004   CREATININE 1.61 (H) 10/18/2022 1341   CREATININE 1.2 (H) 01/07/2017 1004   CALCIUM 8.9 10/18/2022 1341   CALCIUM 9.4 01/07/2017 1004   PROT 6.5 10/18/2022 1341   PROT 6.7 01/07/2017 1004   ALBUMIN 3.6 10/18/2022 1341   ALBUMIN 3.9 01/07/2017 1004   AST 11 (L) 10/18/2022 1341   AST 20 01/07/2017 1004   ALT 12 10/18/2022 1341   ALT 14 01/07/2017 1004   ALKPHOS 96 10/18/2022 1341   ALKPHOS 70 01/07/2017 1004   BILITOT 0.4 10/18/2022 1341   BILITOT 0.49 01/07/2017 1004   GFRNONAA 31 (L) 10/18/2022 1341   GFRNONAA 47 (L) 05/18/2013 1256   GFRAA 40 (L) 09/28/2019 1053   GFRAA 41 (L) 07/06/2018 0959   GFRAA 55 (L) 05/18/2013 1256    No results found for: "SPEP", "UPEP"  Lab Results  Component Value Date   WBC 6.4 10/18/2022   NEUTROABS 4.5 10/18/2022   HGB 8.0 (L) 10/18/2022   HCT 26.1 (L) 10/18/2022   MCV 73.3 (L) 10/18/2022   PLT 238 10/18/2022      Chemistry      Component Value Date/Time   NA 136 10/18/2022 1341   NA 136 01/07/2017 1004   K 4.1 10/18/2022 1341   K 4.5 01/07/2017 1004   CL 102 10/18/2022 1341   CO2 25 10/18/2022 1341   CO2 23 01/07/2017 1004   BUN 23 10/18/2022 1341   BUN 23.3 01/07/2017 1004   CREATININE 1.61 (H) 10/18/2022 1341   CREATININE 1.2 (H) 01/07/2017 1004   GLU 202 (H) 08/09/2014 1000      Component Value Date/Time   CALCIUM 8.9 10/18/2022 1341   CALCIUM 9.4 01/07/2017 1004   ALKPHOS 96 10/18/2022 1341   ALKPHOS 70 01/07/2017 1004   AST 11 (L)  10/18/2022 1341   AST 20 01/07/2017 1004   ALT 12 10/18/2022 1341   ALT 14 01/07/2017 1004   BILITOT 0.4 10/18/2022 1341   BILITOT 0.49 01/07/2017 1004

## 2022-10-18 NOTE — Assessment & Plan Note (Signed)
She has multifactorial anemia, combination of anemia chronic kidney disease, from diabetes and iron deficiency She has failed oral iron treatment and did not tolerated The most likely cause of her anemia is due to chronic blood loss/malabsorption syndrome. We discussed some of the risks, benefits, and alternatives of intravenous iron infusions. The patient is symptomatic from anemia and the iron level is critically low. She tolerated oral iron supplement poorly and desires to achieved higher levels of iron faster for adequate hematopoesis. Some of the side-effects to be expected including risks of infusion reactions, phlebitis, headaches, nausea and fatigue.  The patient is willing to proceed. Patient education material was dispensed.  Goal is to keep ferritin level greater than 50 and resolution of anemia

## 2022-10-21 ENCOUNTER — Encounter: Payer: Self-pay | Admitting: Hematology and Oncology

## 2022-10-23 ENCOUNTER — Ambulatory Visit (HOSPITAL_BASED_OUTPATIENT_CLINIC_OR_DEPARTMENT_OTHER)
Admission: RE | Admit: 2022-10-23 | Discharge: 2022-10-23 | Disposition: A | Discharge status: Home / Self Care | Payer: Medicare Other | Source: Ambulatory Visit | Attending: Hematology and Oncology | Admitting: Hematology and Oncology

## 2022-10-23 DIAGNOSIS — C8580 Other specified types of non-Hodgkin lymphoma, unspecified site: Secondary | ICD-10-CM | POA: Insufficient documentation

## 2022-10-24 ENCOUNTER — Telehealth: Payer: Self-pay

## 2022-10-24 NOTE — Telephone Encounter (Signed)
Called and scheduled appt with Dr. Bertis Ruddy on 9/27 at 1 pm. She is aware of appt.

## 2022-10-25 ENCOUNTER — Inpatient Hospital Stay: Payer: Medicare Other

## 2022-10-25 ENCOUNTER — Inpatient Hospital Stay: Payer: Medicare Other | Admitting: Hematology and Oncology

## 2022-10-25 ENCOUNTER — Encounter: Payer: Self-pay | Admitting: Hematology and Oncology

## 2022-10-25 VITALS — BP 137/55 | HR 100 | Resp 18

## 2022-10-25 VITALS — BP 173/73 | HR 99 | Temp 97.7°F | Resp 17 | Ht 64.0 in | Wt 149.4 lb

## 2022-10-25 DIAGNOSIS — C8580 Other specified types of non-Hodgkin lymphoma, unspecified site: Secondary | ICD-10-CM

## 2022-10-25 DIAGNOSIS — D500 Iron deficiency anemia secondary to blood loss (chronic): Secondary | ICD-10-CM | POA: Diagnosis not present

## 2022-10-25 DIAGNOSIS — Z95828 Presence of other vascular implants and grafts: Secondary | ICD-10-CM

## 2022-10-25 DIAGNOSIS — Z8572 Personal history of non-Hodgkin lymphomas: Secondary | ICD-10-CM | POA: Diagnosis not present

## 2022-10-25 MED ORDER — SODIUM CHLORIDE 0.9 % IV SOLN: Freq: Once | INTRAVENOUS | Status: AC

## 2022-10-25 MED ORDER — SODIUM CHLORIDE 0.9 % IV SOLN
510.0000 mg | Freq: Once | INTRAVENOUS | Status: AC
  Administered 2022-10-25: 510 mg via INTRAVENOUS
  Filled 2022-10-25: qty 510

## 2022-10-25 NOTE — Progress Notes (Signed)
Pt observed for 30 minutes post Feraheme infusion. Pt tolerated Tx well w/out incident. VSS at discharge.  Ambulatory to lobby.

## 2022-10-25 NOTE — Progress Notes (Signed)
[facility] OFFICE PROGRESS NOTE  Patient Care Team: [facility] as PCP - [name], MD as Consulting Physician (Ophthalmology) Nils Flack, MD as Referring Physician (Dermatology) Artis Delay, MD as Consulting Physician (Hematology and Oncology) Juanell Fairly, RN as Case Manager  HISTORY OF PRESENTING ILLNESS: Discussed the use of AI scribe software for clinical note transcription with the patient, who gave verbal consent to proceed.  History of Present Illness   The patient, with a history of lymphoma, was seen today with her son present for CT scan results. The scan did not show any signs of lymphoma recurrence. However, the scan revealed significant stool accumulation in the bowels, suggesting constipation, despite the patient's report of frequent bowel movements post-scan.  The patient was not on any iron supplements at the time of the consultation. The patient's last EGD, performed ten years ago, revealed the presence of an ulcer and esophagitis. The patient has not had any gastrointestinal procedures since then.         Assessment and Plan    Iron Deficiency Anemia Severe anemia with low iron levels. No evidence of lymphoma recurrence on CT scan. Constipation noted on CT scan. History of esophagitis and gastric ulcer on prior endoscopy. Concern for possible GI bleed as cause of anemia. -Refer to GI for upper endoscopy and colonoscopy. -Administer IV iron today and on 11/08/2022. -Reevaluate need for additional iron therapy based on labs on 11/08/2022. -Follow-up in clinic in January 2025, 3 months after last dose of iron.  Constipation Noted on CT scan. -Address with GI referral.   Hx of marginal zone lymphoma No signs of cancer recurrrence.          Orders Placed This Encounter  Procedures   Ambulatory referral to Gastroenterology    Referral Priority:   Urgent    Referral Type:   Consultation    Referral  Reason:   Specialty Services Required    Number of Visits Requested:   1    All questions were answered. The patient knows to call the clinic with any problems, questions or concerns. The total time spent in the appointment was 30 minutes encounter with patients including review of chart and various tests results, discussions about plan of care and coordination of care plan   Artis Delay, MD 10/25/2022 1:29 PM  REVIEW OF SYSTEMS:   Constitutional: Denies fevers, chills or abnormal weight loss Eyes: Denies blurriness of vision Ears, nose, mouth, throat, and face: Denies mucositis or sore throat Respiratory: Denies cough, dyspnea or wheezes Cardiovascular: Denies palpitation, chest discomfort or lower extremity swelling Skin: Denies abnormal skin rashes Lymphatics: Denies new lymphadenopathy or easy bruising Neurological:Denies numbness, tingling or new weaknesses Behavioral/Psych: Mood is stable, no new changes  All other systems were reviewed with the patient and are negative.  I have reviewed the past medical history, past surgical history, social history and family history with the patient and they are unchanged from previous note.  ALLERGIES:  is allergic to aspirin, capoten [captopril], glyburide, insulin detemir, other, saxagliptin, statins, and zocor [simvastatin].  MEDICATIONS:  Current Outpatient Medications  Medication Sig Dispense Refill   Accu-Chek FastClix Lancets MISC USE 1 TO CHECK GLUCOSE ONCE DAILY     acyclovir (ZOVIRAX) 400 MG tablet Take 1 tablet by mouth once daily 30 tablet 11   alendronate (FOSAMAX) 70 MG tablet Take 70 mg by mouth once a week.     amLODipine (NORVASC) 10 MG tablet TAKE 1 TABLET BY MOUTH  AT BEDTIME 90 tablet 0   amLODipine (NORVASC) 5 MG tablet Take 5 mg by mouth daily.     aspirin 81 MG tablet Take 40.5 mg by mouth 3 (three) times a week.     Blood Glucose Monitoring Suppl (ACCU-CHEK GUIDE) w/Device KIT See admin instructions.     calcium  carbonate (OSCAL) 1500 (600 Ca) MG TABS tablet 1 tablet with meals     cholecalciferol (VITAMIN D) 1000 UNITS tablet Take 1,000 Units by mouth daily.      enalapril (VASOTEC) 20 MG tablet Take 20 mg by mouth every morning.     ezetimibe (ZETIA) 10 MG tablet Take 1 tablet by mouth daily.     fish oil-omega-3 fatty acids 1000 MG capsule Take 1 g by mouth daily.      glimepiride (AMARYL) 4 MG tablet Take 4 mg by mouth daily with breakfast. Per patient takes in am and pm.     glucose blood test strip Use as instructed 100 each 12   JARDIANCE 10 MG TABS tablet Take 10 mg by mouth daily.     lidocaine-prilocaine (EMLA) cream Apply 1 application topically as needed. 30 g 3   Magnesium 500 MG CAPS Take 500 mg by mouth daily.      metFORMIN (GLUCOPHAGE-XR) 500 MG 24 hr tablet SMARTSIG:1 Tablet(s) By Mouth Morning-Evening     Misc Natural Products (LUTEIN 20 PO) Take by mouth daily.     Multiple Vitamins-Minerals (CENTRUM CARDIO) TABS Take 1 tablet by mouth daily.     Multiple Vitamins-Minerals (ICAPS) CAPS Take by mouth daily.     No current facility-administered medications for this visit.    SUMMARY OF ONCOLOGIC HISTORY: Oncology History Overview Note  Marginal zone lymphoma, with lymph node and bone marrow involvement along with splenomegaly   Primary site: Lymphoid Neoplasms   Staging method: AJCC 6th Edition   Clinical: Stage IV signed by Artis Delay, MD on 09/02/2013  7:38 PM   Summary: Stage IV     Marginal zone lymphoma (HCC)  07/29/2013 Imaging   CT scan shows splenomegaly and abnormal lymphadenopathy.   08/13/2013 Imaging   PET CT scan confirmed lymphadenopathy and splenomegaly, those areas are PET avid   08/23/2013 Surgery   [id number] left axillary lymph node biopsy confirmed a low-grade lymphoma.   08/27/2013 Bone Marrow Biopsy   Bone marrow biopsy confirmed lymphoma.[id number]   09/08/2013 - 09/29/2013 Chemotherapy   She completed 4 cycles of rituximab with resolution of  splenomegaly.   12/29/2013 Imaging   PET scan show complete response.   07/11/2014 Imaging   PET scan showed recurrent disease   07/19/2014 - 08/09/2014 Chemotherapy   She completed 4 cycles of rituximab with resolution of splenomegaly   10/12/2014 Imaging   Repeat PET/CT scan showed near complete response to treatment.   10/13/2014 - 02/09/2015 Chemotherapy   She is placed on rituximab maintenance therapy.   03/29/2015 Imaging   PET CT showed possible mild progression of splenomegaly   04/26/2015 - 02/22/2016 Chemotherapy   She started taking Ibrutinib   04/28/2015 Adverse Reaction   Treatment was placed temporarily on hold due to severe diarrhea and resumed at reduced dose   07/03/2015 PET scan   No hypermetabolic adenopathy in the neck, chest, abdomen or pelvis.2. Spleen is not hypermetabolic and appears slightly less prominent size.   02/06/2016 PET scan   Hypermetabolic LEFT axillary lymph nodes. Concern for HIGH-GRADE LYMPHOMA LOCALIZED RECURRENCE. 2. Prominent spleen without hypermetabolic activity. No change in size  from comparison. 3. No additional hypermetabolic adenopathy on scan.   02/19/2016 Procedure   Technically successful ultrasound-guided core biopsy, left axillary adenopathy.   02/19/2016 Pathology Results   Lymph node, needle/core biopsy, left axilla - ATYPICAL LYMPHOID PROLIFERATION SUSPICIOUS FOR LYMPHOMA. - SEE COMMENT. Microscopic Comment The sections show multiple very small and skinny fragments of lymph nodal tissue displaying areas of lymphoid expansion by a population of small to medium size lymphocytes with round to irregular nuclei, scanty to moderately abundant cytoplasm, dense chromatin and inconspicuous nucleoli. This is associated with scattering of "naked" germinal centers with abundant tingible body macrophages. To further evaluate this process, flow cytometric analysis was performed and shows a minor population of monoclonal B cells expressing pan B-cell  antigens including CD20 with associated kappa light chain restriction. In addition, immunohistochemical stains were performed including CD20, CD79a, CD3, CD5, CD10, BCL-2, CD21 with appropriate controls. The stains show a mixture of T and B cells with slight predominance of B cells. CD21 highlights numerous areas with dendritic networks, correlating with the presence of germinal centers. CD10 shows focal positivity likely in germinal centers. BCL-2 is diffusely positive except in areas of apparent germinal centers. The overall features are atypical and suspicious for involvement by a B-cell lymphoproliferative process, particularly given the B-cell monoclonality by flow cytometry. However, morphologic evaluation is extremely limited due to small biopsy fragments and additional material (excisional biopsy) is strongly recommended for further evaluation. (BNS:ecj 02/21/2016)    03/04/2016 Procedure   Placement of single lumen port a cath via right internal jugular vein. The catheter tip lies at the cavo-atrial junction. A power injectable port a cath was placed and is ready for immediate use.   03/07/2016 - 05/31/2016 Chemotherapy   She received treatment with bendamustine and Gazyva   05/27/2016 PET scan   1. No evidence of active lymphoma on whole-body scan. 2. Resolution of metabolic activity previously identified in the LEFT axilla. 3. Small amount free fluid the pelvis.   03/07/2017 PET scan   1. No findings of active lymphoma in the chest, abdomen, or pelvis. 2. Other imaging findings of potential clinical significance: Aortic Atherosclerosis (ICD10-I70.0). Coronary atherosclerosis. Chronic paranasal sinusitis. Moderately distended gallbladder, chronic   05/02/2020 Procedure   Removal of implanted Port-A-Cath utilizing sharp and blunt dissection. The procedure was uncomplicated.   09/24/2021 Imaging   1. No adenopathy above or below the diaphragm and no splenomegaly and no suspicious soft tissue or  osseous lesion identified to suggest lymphomatous disease recurrence. 2. Small hiatal hernia with symmetric distal esophageal wall thickening and reflux/retained contrast in the distal esophagus suggestive of reflux esophagitis consider further evaluation with endoscopy if clinically indicated. 3. Distended gallbladder without CT findings of acute cholecystitis, commonly reflects fasting state, suggest correlation with right upper quadrant tenderness to palpation. 4. Moderate volume of formed stool throughout the colon as can be seen with constipation. 5.  Aortic Atherosclerosis (ICD10-I70.0).     PHYSICAL EXAMINATION: ECOG PERFORMANCE STATUS: 0 - Asymptomatic  Vitals:   10/25/22 1303  BP: (!) 173/73  Pulse: 99  Resp: 17  Temp: 97.7 F (36.5 C)  SpO2: 99%   Filed Weights   10/25/22 1303  Weight: 149 lb 6.4 oz (67.8 kg)    GENERAL:alert, no distress and comfortable  LABORATORY DATA:  I have reviewed the data as listed    Component Value Date/Time   NA 136 10/18/2022 1341   NA 136 01/07/2017 1004   K 4.1 10/18/2022 1341   K 4.5 01/07/2017  1004   CL 102 10/18/2022 1341   CO2 25 10/18/2022 1341   CO2 23 01/07/2017 1004   GLUCOSE 253 (H) 10/18/2022 1341   GLUCOSE 217 (H) 01/07/2017 1004   BUN 23 10/18/2022 1341   BUN 23.3 01/07/2017 1004   CREATININE 1.61 (H) 10/18/2022 1341   CREATININE 1.2 (H) 01/07/2017 1004   CALCIUM 8.9 10/18/2022 1341   CALCIUM 9.4 01/07/2017 1004   PROT 6.5 10/18/2022 1341   PROT 6.7 01/07/2017 1004   ALBUMIN 3.6 10/18/2022 1341   ALBUMIN 3.9 01/07/2017 1004   AST 11 (L) 10/18/2022 1341   AST 20 01/07/2017 1004   ALT 12 10/18/2022 1341   ALT 14 01/07/2017 1004   ALKPHOS 96 10/18/2022 1341   ALKPHOS 70 01/07/2017 1004   BILITOT 0.4 10/18/2022 1341   BILITOT 0.49 01/07/2017 1004   GFRNONAA 31 (L) 10/18/2022 1341   GFRNONAA 47 (L) 05/18/2013 1256   GFRAA 40 (L) 09/28/2019 1053   GFRAA 41 (L) 07/06/2018 0959   GFRAA 55 (L) 05/18/2013 1256     No results found for: "SPEP", "UPEP"  Lab Results  Component Value Date   WBC 6.4 10/18/2022   NEUTROABS 4.5 10/18/2022   HGB 8.0 (L) 10/18/2022   HCT 26.1 (L) 10/18/2022   MCV 73.3 (L) 10/18/2022   PLT 238 10/18/2022      Chemistry      Component Value Date/Time   NA 136 10/18/2022 1341   NA 136 01/07/2017 1004   K 4.1 10/18/2022 1341   K 4.5 01/07/2017 1004   CL 102 10/18/2022 1341   CO2 25 10/18/2022 1341   CO2 23 01/07/2017 1004   BUN 23 10/18/2022 1341   BUN 23.3 01/07/2017 1004   CREATININE 1.61 (H) 10/18/2022 1341   CREATININE 1.2 (H) 01/07/2017 1004   GLU 202 (H) 08/09/2014 1000      Component Value Date/Time   CALCIUM 8.9 10/18/2022 1341   CALCIUM 9.4 01/07/2017 1004   ALKPHOS 96 10/18/2022 1341   ALKPHOS 70 01/07/2017 1004   AST 11 (L) 10/18/2022 1341   AST 20 01/07/2017 1004   ALT 12 10/18/2022 1341   ALT 14 01/07/2017 1004   BILITOT 0.4 10/18/2022 1341   BILITOT 0.49 01/07/2017 1004       RADIOGRAPHIC STUDIES: I have reviewed imaging study with the patient and her son I have personally reviewed the radiological images as listed and agreed with the findings in the report. CT CHEST ABDOMEN PELVIS WO CONTRAST  Result Date: 10/23/2022 CLINICAL DATA:  History of marginal zone lymphoma, monitor. * Tracking Code: BO * EXAM: CT CHEST, ABDOMEN AND PELVIS WITHOUT CONTRAST TECHNIQUE: Multidetector CT imaging of the chest, abdomen and pelvis was performed following the standard protocol without IV contrast. RADIATION DOSE REDUCTION: This exam was performed according to the departmental dose-optimization program which includes automated exposure control, adjustment of the mA and/or kV according to patient size and/or use of iterative reconstruction technique. COMPARISON:  Multiple priors including most recent CT September 21, 2021 FINDINGS: CT CHEST FINDINGS Cardiovascular: Aortic atherosclerosis. Normal size heart. Calcification of the mitral annulus. Coronary artery  calcifications. Mediastinum/Nodes: No suspicious thyroid nodule. No pathologically enlarged mediastinal, hilar or axillary lymph nodes. Small hiatal hernia with refluxed versus retained enteric contrast in a patulous esophagus. Lungs/Pleura: Biapical pleuroparenchymal scarring. Clustered nodularity in the dependent paramedian left lower lobe measures up to 7 mm on image 132/series 4. Mild diffuse bronchial wall thickening. No pleural effusion. Musculoskeletal: No aggressive lytic or  blastic lesion of bone. CT ABDOMEN PELVIS FINDINGS Hepatobiliary: Calcified hepatic granulomas. Gallbladder is distended without wall thickening or pericholecystic fluid. No biliary ductal dilation. Pancreas: No pancreatic ductal dilation or evidence of acute inflammation. Spleen: No splenomegaly.  Calcified splenic granulomas. Adrenals/Urinary Tract: Bilateral adrenal glands appear normal. No hydronephrosis. No contour deforming renal mass. Urinary bladder is unremarkable for degree of distension. Stomach/Bowel: Radiopaque enteric contrast material traverses the ascending colon. Small hiatal hernia. No pathologic dilation of small or large bowel. Normal appendix. Moderate volume of formed stool in the colon. Vascular/Lymphatic: Aortic atherosclerosis. Normal caliber abdominal aorta. Smooth IVC contours. No pathologically enlarged abdominal or pelvic lymph nodes. Reproductive: Uterus and bilateral adnexa are unremarkable. Other: No significant abdominopelvic free fluid. Musculoskeletal: No aggressive lytic or blastic lesion of bone. Multilevel degenerative changes spine. IMPRESSION: 1. No splenomegaly or pathologically enlarged lymph nodes in the chest, abdomen, or pelvis. 2. Clustered nodularity in the dependent paramedian left lower lobe measures up to 7 mm, favored to reflect an infectious/inflammatory process. Recommend attention on follow-up chest CT. 3. Small hiatal hernia with refluxed versus retained enteric contrast in a  patulous esophagus suggest correlation for gastroesophageal reflux. 4.  Aortic Atherosclerosis (ICD10-I70.0). Electronically Signed   By: Maudry Mayhew M.D.   On: 10/23/2022 14:46

## 2022-10-25 NOTE — Patient Instructions (Signed)
 Ferumoxytol Injection What is this medication? FERUMOXYTOL (FER ue MOX i tol) treats low levels of iron in your body (iron deficiency anemia). Iron is a mineral that plays an important role in making red blood cells, which carry oxygen from your lungs to the rest of your body. This medicine may be used for other purposes; ask your health care provider or pharmacist if you have questions. COMMON BRAND NAME(S): Feraheme What should I tell my care team before I take this medication? They need to know if you have any of these conditions: Anemia not caused by low iron levels High levels of iron in the blood Magnetic resonance imaging (MRI) test scheduled An unusual or allergic reaction to iron, other medications, foods, dyes, or preservatives Pregnant or trying to get pregnant Breastfeeding How should I use this medication? This medication is injected into a vein. It is given by your care team in a hospital or clinic setting. Talk to your care team the use of this medication in children. Special care may be needed. Overdosage: If you think you have taken too much of this medicine contact a poison control center or emergency room at once. NOTE: This medicine is only for you. Do not share this medicine with others. What if I miss a dose? It is important not to miss your dose. Call your care team if you are unable to keep an appointment. What may interact with this medication? Other iron products This list may not describe all possible interactions. Give your health care provider a list of all the medicines, herbs, non-prescription drugs, or dietary supplements you use. Also tell them if you smoke, drink alcohol, or use illegal drugs. Some items may interact with your medicine. What should I watch for while using this medication? Visit your care team regularly. Tell your care team if your symptoms do not start to get better or if they get worse. You may need blood work done while you are taking this  medication. You may need to follow a special diet. Talk to your care team. Foods that contain iron include: whole grains/cereals, dried fruits, beans, or peas, leafy green vegetables, and organ meats (liver, kidney). What side effects may I notice from receiving this medication? Side effects that you should report to your care team as soon as possible: Allergic reactions--skin rash, itching, hives, swelling of the face, lips, tongue, or throat Low blood pressure--dizziness, feeling faint or lightheaded, blurry vision Shortness of breath Side effects that usually do not require medical attention (report to your care team if they continue or are bothersome): Flushing Headache Joint pain Muscle pain Nausea Pain, redness, or irritation at injection site This list may not describe all possible side effects. Call your doctor for medical advice about side effects. You may report side effects to FDA at 1-800-FDA-1088. Where should I keep my medication? This medication is given in a hospital or clinic. It will not be stored at home. NOTE: This sheet is a summary. It may not cover all possible information. If you have questions about this medicine, talk to your doctor, pharmacist, or health care provider.  2024 Elsevier/Gold Standard (2022-06-21 00:00:00)

## 2022-10-29 ENCOUNTER — Encounter: Payer: Self-pay | Admitting: Physician Assistant

## 2022-11-08 ENCOUNTER — Other Ambulatory Visit: Payer: Self-pay

## 2022-11-08 ENCOUNTER — Inpatient Hospital Stay: Payer: Medicare Other | Attending: Hematology and Oncology

## 2022-11-08 ENCOUNTER — Ambulatory Visit: Payer: Medicare Other | Admitting: Hematology and Oncology

## 2022-11-08 ENCOUNTER — Inpatient Hospital Stay: Payer: Medicare Other

## 2022-11-08 VITALS — BP 125/60 | HR 63 | Temp 98.3°F | Resp 16

## 2022-11-08 DIAGNOSIS — D509 Iron deficiency anemia, unspecified: Secondary | ICD-10-CM | POA: Insufficient documentation

## 2022-11-08 DIAGNOSIS — C8580 Other specified types of non-Hodgkin lymphoma, unspecified site: Secondary | ICD-10-CM

## 2022-11-08 DIAGNOSIS — D630 Anemia in neoplastic disease: Secondary | ICD-10-CM

## 2022-11-08 DIAGNOSIS — Z95828 Presence of other vascular implants and grafts: Secondary | ICD-10-CM

## 2022-11-08 DIAGNOSIS — D500 Iron deficiency anemia secondary to blood loss (chronic): Secondary | ICD-10-CM

## 2022-11-08 LAB — CBC WITH DIFFERENTIAL/PLATELET
Abs Immature Granulocytes: 0.03 10*3/uL (ref 0.00–0.07)
Basophils Absolute: 0.1 10*3/uL (ref 0.0–0.1)
Basophils Relative: 1 %
Eosinophils Absolute: 0.1 10*3/uL (ref 0.0–0.5)
Eosinophils Relative: 1 %
HCT: 29.5 % — ABNORMAL LOW (ref 36.0–46.0)
Hemoglobin: 9 g/dL — ABNORMAL LOW (ref 12.0–15.0)
Immature Granulocytes: 0 %
Lymphocytes Relative: 14 %
Lymphs Abs: 1 10*3/uL (ref 0.7–4.0)
MCH: 23.6 pg — ABNORMAL LOW (ref 26.0–34.0)
MCHC: 30.5 g/dL (ref 30.0–36.0)
MCV: 77.4 fL — ABNORMAL LOW (ref 80.0–100.0)
Monocytes Absolute: 0.5 10*3/uL (ref 0.1–1.0)
Monocytes Relative: 7 %
Neutro Abs: 5.5 10*3/uL (ref 1.7–7.7)
Neutrophils Relative %: 77 %
Platelets: 263 10*3/uL (ref 150–400)
RBC: 3.81 MIL/uL — ABNORMAL LOW (ref 3.87–5.11)
RDW: 19.9 % — ABNORMAL HIGH (ref 11.5–15.5)
WBC: 7.2 10*3/uL (ref 4.0–10.5)
nRBC: 0 % (ref 0.0–0.2)

## 2022-11-08 LAB — SAMPLE TO BLOOD BANK

## 2022-11-08 LAB — ABO/RH: ABO/RH(D): B POS

## 2022-11-08 MED ORDER — SODIUM CHLORIDE 0.9 % IV SOLN: Freq: Once | INTRAVENOUS | Status: AC

## 2022-11-08 MED ORDER — SODIUM CHLORIDE 0.9 % IV SOLN
510.0000 mg | Freq: Once | INTRAVENOUS | Status: AC
  Administered 2022-11-08: 510 mg via INTRAVENOUS
  Filled 2022-11-08: qty 17

## 2022-11-08 NOTE — Patient Instructions (Signed)
 Ferumoxytol Injection What is this medication? FERUMOXYTOL (FER ue MOX i tol) treats low levels of iron in your body (iron deficiency anemia). Iron is a mineral that plays an important role in making red blood cells, which carry oxygen from your lungs to the rest of your body. This medicine may be used for other purposes; ask your health care provider or pharmacist if you have questions. COMMON BRAND NAME(S): Feraheme What should I tell my care team before I take this medication? They need to know if you have any of these conditions: Anemia not caused by low iron levels High levels of iron in the blood Magnetic resonance imaging (MRI) test scheduled An unusual or allergic reaction to iron, other medications, foods, dyes, or preservatives Pregnant or trying to get pregnant Breastfeeding How should I use this medication? This medication is injected into a vein. It is given by your care team in a hospital or clinic setting. Talk to your care team the use of this medication in children. Special care may be needed. Overdosage: If you think you have taken too much of this medicine contact a poison control center or emergency room at once. NOTE: This medicine is only for you. Do not share this medicine with others. What if I miss a dose? It is important not to miss your dose. Call your care team if you are unable to keep an appointment. What may interact with this medication? Other iron products This list may not describe all possible interactions. Give your health care provider a list of all the medicines, herbs, non-prescription drugs, or dietary supplements you use. Also tell them if you smoke, drink alcohol, or use illegal drugs. Some items may interact with your medicine. What should I watch for while using this medication? Visit your care team regularly. Tell your care team if your symptoms do not start to get better or if they get worse. You may need blood work done while you are taking this  medication. You may need to follow a special diet. Talk to your care team. Foods that contain iron include: whole grains/cereals, dried fruits, beans, or peas, leafy green vegetables, and organ meats (liver, kidney). What side effects may I notice from receiving this medication? Side effects that you should report to your care team as soon as possible: Allergic reactions--skin rash, itching, hives, swelling of the face, lips, tongue, or throat Low blood pressure--dizziness, feeling faint or lightheaded, blurry vision Shortness of breath Side effects that usually do not require medical attention (report to your care team if they continue or are bothersome): Flushing Headache Joint pain Muscle pain Nausea Pain, redness, or irritation at injection site This list may not describe all possible side effects. Call your doctor for medical advice about side effects. You may report side effects to FDA at 1-800-FDA-1088. Where should I keep my medication? This medication is given in a hospital or clinic. It will not be stored at home. NOTE: This sheet is a summary. It may not cover all possible information. If you have questions about this medicine, talk to your doctor, pharmacist, or health care provider.  2024 Elsevier/Gold Standard (2022-06-21 00:00:00)

## 2022-11-13 ENCOUNTER — Ambulatory Visit (INDEPENDENT_AMBULATORY_CARE_PROVIDER_SITE_OTHER): Payer: Medicare Other

## 2022-11-13 ENCOUNTER — Ambulatory Visit: Payer: Medicare Other | Admitting: Physician Assistant

## 2022-11-13 ENCOUNTER — Encounter: Payer: Self-pay | Admitting: Physician Assistant

## 2022-11-13 VITALS — BP 140/62 | HR 92 | Ht 64.0 in | Wt 149.0 lb

## 2022-11-13 DIAGNOSIS — R011 Cardiac murmur, unspecified: Secondary | ICD-10-CM

## 2022-11-13 DIAGNOSIS — K5730 Diverticulosis of large intestine without perforation or abscess without bleeding: Secondary | ICD-10-CM

## 2022-11-13 DIAGNOSIS — R42 Dizziness and giddiness: Secondary | ICD-10-CM

## 2022-11-13 DIAGNOSIS — D509 Iron deficiency anemia, unspecified: Secondary | ICD-10-CM | POA: Diagnosis not present

## 2022-11-13 DIAGNOSIS — Z8774 Personal history of (corrected) congenital malformations of heart and circulatory system: Secondary | ICD-10-CM

## 2022-11-13 DIAGNOSIS — K5904 Chronic idiopathic constipation: Secondary | ICD-10-CM

## 2022-11-13 LAB — IBC + FERRITIN
Ferritin: 633.7 ng/mL — ABNORMAL HIGH (ref 10.0–291.0)
Iron: 36 ug/dL — ABNORMAL LOW (ref 42–145)
Saturation Ratios: 13.9 % — ABNORMAL LOW (ref 20.0–50.0)
TIBC: 259 ug/dL (ref 250.0–450.0)
Transferrin: 185 mg/dL — ABNORMAL LOW (ref 212.0–360.0)

## 2022-11-13 LAB — CBC WITH DIFFERENTIAL/PLATELET
Basophils Absolute: 0.1 10*3/uL (ref 0.0–0.1)
Basophils Relative: 0.7 % (ref 0.0–3.0)
Eosinophils Absolute: 0.1 10*3/uL (ref 0.0–0.7)
Eosinophils Relative: 1.5 % (ref 0.0–5.0)
HCT: 29.7 % — ABNORMAL LOW (ref 36.0–46.0)
Hemoglobin: 9.5 g/dL — ABNORMAL LOW (ref 12.0–15.0)
Lymphocytes Relative: 15.1 % (ref 12.0–46.0)
Lymphs Abs: 1.1 10*3/uL (ref 0.7–4.0)
MCHC: 32 g/dL (ref 30.0–36.0)
MCV: 75.4 fL — ABNORMAL LOW (ref 78.0–100.0)
Monocytes Absolute: 0.6 10*3/uL (ref 0.1–1.0)
Monocytes Relative: 8.1 % (ref 3.0–12.0)
Neutro Abs: 5.6 10*3/uL (ref 1.4–7.7)
Neutrophils Relative %: 74.6 % (ref 43.0–77.0)
Platelets: 279 10*3/uL (ref 150.0–400.0)
RBC: 3.94 Mil/uL (ref 3.87–5.11)
RDW: 22.9 % — ABNORMAL HIGH (ref 11.5–15.5)
WBC: 7.5 10*3/uL (ref 4.0–10.5)

## 2022-11-13 LAB — COMPREHENSIVE METABOLIC PANEL
ALT: 18 U/L (ref 0–35)
AST: 19 U/L (ref 0–37)
Albumin: 3.8 g/dL (ref 3.5–5.2)
Alkaline Phosphatase: 118 U/L — ABNORMAL HIGH (ref 39–117)
BUN: 25 mg/dL — ABNORMAL HIGH (ref 6–23)
CO2: 28 meq/L (ref 19–32)
Calcium: 9.7 mg/dL (ref 8.4–10.5)
Chloride: 99 meq/L (ref 96–112)
Creatinine, Ser: 1.38 mg/dL — ABNORMAL HIGH (ref 0.40–1.20)
GFR: 34.45 mL/min — ABNORMAL LOW (ref >60.00)
Glucose, Bld: 203 mg/dL — ABNORMAL HIGH (ref 70–99)
Potassium: 4.6 meq/L (ref 3.5–5.1)
Sodium: 136 meq/L (ref 135–145)
Total Bilirubin: 0.4 mg/dL (ref 0.2–1.2)
Total Protein: 6.7 g/dL (ref 6.0–8.3)

## 2022-11-13 NOTE — Patient Instructions (Addendum)
Your provider has requested that you go to the basement level for lab work before leaving today. Press "B" on the elevator. The lab is located at the first door on the left as you exit the elevator.  Follow up with PCP about your dizziness, check sugar next time it happens, can be from jardiance and anemia.   Advised to go to the ER if there is any severe weakness, severe abdominal pain, vomit blood, dark red blood in your bowel movement, shortness of breath or chest pain.   Miralax is an osmotic laxative.  It only brings more water into the stool.  This is safe to take daily.  Can take up to 17 gram of miralax twice a day.  Mix with juice or coffee.  Start 1 capful at night for 3-4 days and reassess your response in 3-4 days.  You can increase and decrease the dose based on your response.  Remember, it can take up to 3-4 days to take effect OR for the effects to wear off.   I often pair this with benefiber in the morning to help assure the stool is not too loose.   Toileting tips to help with your constipation - Drink at least 64-80 ounces of water/liquid per day. - Establish a time to try to move your bowels every day.  For many people, this is after a cup of coffee or after a meal such as breakfast. - Sit all of the way back on the toilet keeping your back fairly straight and while sitting up, try to rest the tops of your forearms on your upper thighs.   - Raising your feet with a step stool/squatty potty can be helpful to improve the angle that allows your stool to pass through the rectum. - Relax the rectum feeling it bulge toward the toilet water.  If you feel your rectum raising toward your body, you are contracting rather than relaxing. - Breathe in and slowly exhale. "Belly breath" by expanding your belly towards your belly button. Keep belly expanded as you gently direct pressure down and back to the anus.  A low pitched GRRR sound can assist with increasing intra-abdominal pressure.   (Can also trying to blow on a pinwheel and make it move, this helps with the same belly breathing) - Repeat 3-4 times. If unsuccessful, contract the pelvic floor to restore normal tone and get off the toilet.  Avoid excessive straining. - To reduce excessive wiping by teaching your anus to normally contract, place hands on outer aspect of knees and resist knee movement outward.  Hold 5-10 second then place hands just inside of knees and resist inward movement of knees.  Hold 5 seconds.  Repeat a few times each way.  Go to the ER if unable to pass gas, severe AB pain, unable to hold down food, any shortness of breath of chest pain.   We have place urgent referral to Cardiology. Someone from their office will contact you with an appointment. If you have not heard from their office within 1-2 days, please contact them at [phone].   Thank you for choosing me and [facility].  Quentin Mulling, PA- C

## 2022-11-13 NOTE — Progress Notes (Signed)
11/13/2022 Jamie Mendez [id number] 08-27-1933  Referring provider: [facility]* Primary GI doctor: Dr. Chales Abrahams ( Dr. Arlyce Dice 2015)  ASSESSMENT AND PLAN:   Iron Deficiency Anemia Recurrent anemia with history of bleeding arteriovenous malformations (AVMs) in the small intestine capsule endo 2015. Recent iron infusions due to intolerance of oral iron (diarrhea). Hemoglobin of 9 on October 11th. Negative FOBT in the office today -Repeat CBC and iron studies to assess response to iron infusions. -Schedule for EGD and colonoscopy to evaluate for potential sources of bleeding but will get urgent referral to cardiology first for clearance with history of dizziness/systolic murmur/ DOE  Dizziness/Vertigo Reports feeling off balance, particularly with quick movements or turning, mild DOE.  No associated chest pain Noted heart murmur on examination. - can be from anemia/jardiance new medication -Recommend echocardiogram to evaluate for potential aortic stenosis or other cardiac causes of symptoms. -Consider scheduling EGD and colonoscopy after cardiac clearance, will refer urgently to cardiology and can follow up with PCP for possible vertigo.   Constipation Hard stool noted on rectal exam despite near daily bowel movements. -Recommend MiraLAX and fiber for bowel regulation.  Patient Care Team: [facility]CP - [name], MD as Consulting Physician (Ophthalmology) Nils Flack, MD as Referring Physician (Dermatology) Artis Delay, MD as Consulting Physician (Hematology and Oncology)  HISTORY OF PRESENT ILLNESS: 89 y.o. female with a past medical history of hypertension, type 2 diabetes with neuropathy, CKD stage III, pancytopenia marginal zone lymphoma stage IV following with Dr. Bertis Ruddy , IDA and others listed below presents for evaluation of IDA.   10/25/2022 office visit with oncology, CT without any evidence of lymphoma  recurrence.  However CT did note constipation, and patient had IDA with iron 15, ferritin 30 and Hgb 9 IV iron given 10/25/2022 and patient referred here for consideration of endoscopy and colonoscopy.  12/01/2012 colonoscopy, endoscopy for IDA Colonoscopy showed excellent prep with Suprep, mild diverticulosis no source for FOBT positive stool Endoscopy showed superficial gastric ulcer with stigmata of recent hemorrhage, esophageal stricture and erosive esophagitis started on proton pump inhibitor daily. 04/20/2013 capsule endoscopy showed AVM at 2 hours 32 seconds and 2 other tiny AVMs at 4 hours, first AVM is seen almost 2 hours beyond the first duodenal image.  Suggested long-term iron supplements at that time.  Discussed the use of AI scribe software for clinical note transcription with the patient, who gave verbal consent to proceed.   The patient, with a past medical history of lymphoma, diverticulosis, esophageal stenosis, and anemia, presents with ongoing iron deficiency anemia.  The patient has been receiving iron infusions for the past three weeks due to intolerance of oral iron supplements, which caused severe diarrhea. Despite these infusions, the patient's hemoglobin remains low at 9. The patient denies any blood in the stool but reports that the stool was black when taking oral iron supplements.  Denies GERD, dysphagia, nausea, vomiting, AB pain, change in bowel habits, melena, hematochezia.  The patient also reports dizziness, particularly when moving quickly or turning, which has started since beginning the iron infusions. The patient denies chest discomfort, or changes in vision. Has had some DOE.  The patient also reports constipation, despite feeling like she is having regular bowel movements. The patient has a history of diabetes and is currently on medication for this, including Jardiance which is new.  She  reports that she has never smoked. She has never used smokeless tobacco.  She reports that  she does not drink alcohol and does not use drugs.  RELEVANT LABS AND IMAGING: 11/08/2022  HGB 9.0 MCV 77.4 Platelets 263 09/26/2022 Iron 15 Ferritin 30 B12 672   Results   LABS Iron: 15 Ferritin: 30 Hb: 9 (11/08/2022)  RADIOLOGY CT: No recurrence of lymphoma, constipation  DIAGNOSTIC Rectal exam: Hard stool, no masses, negative for blood (11/13/2022)  PATHOLOGY Colonoscopy: Diverticulosis (2014) Endoscopy: Ulcer, esophageal stenosis (2014) Capsule endoscopy: Bleeding AVMs in small intestines (2014) Pathology: Negative (2014)      CBC    Component Value Date/Time   WBC 7.2 11/08/2022 0830   RBC 3.81 (L) 11/08/2022 0830   HGB 9.0 (L) 11/08/2022 0830   HGB 8.0 (L) 10/18/2022 1341   HGB 10.6 (L) 01/07/2017 1004   HCT 29.5 (L) 11/08/2022 0830   HCT 31.4 (L) 01/07/2017 1004   PLT 263 11/08/2022 0830   PLT 238 10/18/2022 1341   PLT 237 01/07/2017 1004   MCV 77.4 (L) 11/08/2022 0830   MCV 89.5 01/07/2017 1004   MCH 23.6 (L) 11/08/2022 0830   MCHC 30.5 11/08/2022 0830   RDW 19.9 (H) 11/08/2022 0830   RDW 13.1 01/07/2017 1004   LYMPHSABS 1.0 11/08/2022 0830   LYMPHSABS 0.8 (L) 01/07/2017 1004   MONOABS 0.5 11/08/2022 0830   MONOABS 0.5 01/07/2017 1004   EOSABS 0.1 11/08/2022 0830   EOSABS 0.0 01/07/2017 1004   BASOSABS 0.1 11/08/2022 0830   BASOSABS 0.0 01/07/2017 1004   Recent Labs    09/26/22 1222 10/18/22 1341 11/08/22 0830  HGB 8.2* 8.0* 9.0*    CMP     Component Value Date/Time   NA 136 10/18/2022 1341   NA 136 01/07/2017 1004   K 4.1 10/18/2022 1341   K 4.5 01/07/2017 1004   CL 102 10/18/2022 1341   CO2 25 10/18/2022 1341   CO2 23 01/07/2017 1004   GLUCOSE 253 (H) 10/18/2022 1341   GLUCOSE 217 (H) 01/07/2017 1004   BUN 23 10/18/2022 1341   BUN 23.3 01/07/2017 1004   CREATININE 1.61 (H) 10/18/2022 1341   CREATININE 1.2 (H) 01/07/2017 1004   CALCIUM 8.9 10/18/2022 1341   CALCIUM 9.4 01/07/2017 1004   PROT 6.5 10/18/2022 1341    PROT 6.7 01/07/2017 1004   ALBUMIN 3.6 10/18/2022 1341   ALBUMIN 3.9 01/07/2017 1004   AST 11 (L) 10/18/2022 1341   AST 20 01/07/2017 1004   ALT 12 10/18/2022 1341   ALT 14 01/07/2017 1004   ALKPHOS 96 10/18/2022 1341   ALKPHOS 70 01/07/2017 1004   BILITOT 0.4 10/18/2022 1341   BILITOT 0.49 01/07/2017 1004   GFRNONAA 31 (L) 10/18/2022 1341   GFRNONAA 47 (L) 05/18/2013 1256   GFRAA 40 (L) 09/28/2019 1053   GFRAA 41 (L) 07/06/2018 0959   GFRAA 55 (L) 05/18/2013 1256      Latest Ref Rng & Units 10/18/2022    1:41 PM 09/26/2022   12:22 PM 09/14/2021   11:59 AM  Hepatic Function  Total Protein 6.5 - 8.1 g/dL 6.5  6.6  6.2   Albumin 3.5 - 5.0 g/dL 3.6  3.7  3.7   AST 15 - 41 U/L 11  11  13    ALT 0 - 44 U/L 12  12  15    Alk Phosphatase 38 - 126 U/L 96  99  119   Total Bilirubin 0.3 - 1.2 mg/dL 0.4  0.4  0.5       Current Medications:   Current Outpatient Medications (Endocrine &  Metabolic):    alendronate (FOSAMAX) 70 MG tablet, Take 70 mg by mouth once a week.   glimepiride (AMARYL) 4 MG tablet, Take 4 mg by mouth daily with breakfast. Per patient takes in am and pm.   JARDIANCE 10 MG TABS tablet, Take 10 mg by mouth daily.   metFORMIN (GLUCOPHAGE-XR) 500 MG 24 hr tablet, SMARTSIG:1 Tablet(s) By Mouth Morning-Evening  Current Outpatient Medications (Cardiovascular):    amLODipine (NORVASC) 10 MG tablet, TAKE 1 TABLET BY MOUTH AT BEDTIME   enalapril (VASOTEC) 20 MG tablet, Take 20 mg by mouth every morning.   ezetimibe (ZETIA) 10 MG tablet, Take 1 tablet by mouth daily.     Current Outpatient Medications (Other):    Accu-Chek FastClix Lancets MISC, USE 1 TO CHECK GLUCOSE ONCE DAILY   acyclovir (ZOVIRAX) 400 MG tablet, Take 1 tablet by mouth once daily   Blood Glucose Monitoring Suppl (ACCU-CHEK GUIDE) w/Device KIT, See admin instructions.   calcium carbonate (OSCAL) 1500 (600 Ca) MG TABS tablet, 1 tablet with meals   cholecalciferol (VITAMIN D) 1000 UNITS tablet, Take  1,000 Units by mouth daily.    fish oil-omega-3 fatty acids 1000 MG capsule, Take 1 g by mouth daily.    glucose blood test strip, Use as instructed   Magnesium 500 MG CAPS, Take 500 mg by mouth daily.    Misc Natural Products (LUTEIN 20 PO), Take by mouth daily.   Multiple Vitamins-Minerals (CENTRUM CARDIO) TABS, Take 1 tablet by mouth daily.   Multiple Vitamins-Minerals (ICAPS) CAPS, Take by mouth daily.  Medical History:  Past Medical History:  Diagnosis Date   Anemia    Diabetes (HCC)    High blood pressure    Hyperlipidemia    Marginal zone lymphoma (HCC) 09/02/2013   Neuropathy    Splenomegaly 07/23/2013   Wears glasses    Weight loss 07/23/2013   Allergies:  Allergies  Allergen Reactions   Aspirin Other (See Comments)    Nose bleeds   Capoten [Captopril] Other (See Comments)    Mouth numbness    Glyburide Other (See Comments)   Insulin Detemir Other (See Comments)    Decreased blood sugar   Other Other (See Comments)   Saxagliptin Other (See Comments)    Elevates blood sugar   Statins Other (See Comments)    Numbness in face   Zocor [Simvastatin] Other (See Comments)    Extreme fatigue     Surgical History:  She  has a past surgical history that includes Colonoscopy; Polypectomy (1989); Esophagogastroduodenoscopy; Breast surgery (Right, 1989); Tonsillectomy; Axillary lymph node biopsy (Left, 08/23/2013); ir generic historical (03/04/2016); ir generic historical (03/04/2016); Breast biopsy (Right); and IR REMOVAL TUN ACCESS W/ PORT W/O FL MOD SED (05/02/2020). Family History:  Her family history includes Cancer in her mother.  REVIEW OF SYSTEMS  : All other systems reviewed and negative except where noted in the History of Present Illness.  PHYSICAL EXAM: BP (!) 140/62   Pulse 92   Ht 5\' 4"  (1.626 m)   Wt 149 lb (67.6 kg)   BMI 25.58 kg/m  General Appearance: Well nourished, in no apparent distress. Head:   Normocephalic and atraumatic. Eyes:  sclerae  anicteric,conjunctive pink  Respiratory: Respiratory effort normal, BS equal bilaterally without rales, rhonchi, wheezing. Cardio: RRR with harsh systolic murmur RSB. Peripheral pulses intact.  Abdomen: Soft,  Obese ,active bowel sounds. No tenderness . No masses. Rectal: Externally, no hemorrhoids observed. Internally, hard stool palpated without masses. Negative for blood.  Musculoskeletal:  Full ROM, Normal gait. Without edema. Skin:  Dry and intact without significant lesions or rashes Neuro: Alert and  oriented x4;  No focal deficits. Psych:  Cooperative. Normal mood and affect.        Doree Albee, PA-C 2:04 PM

## 2022-11-14 ENCOUNTER — Telehealth: Payer: Self-pay

## 2022-11-14 ENCOUNTER — Other Ambulatory Visit: Payer: Self-pay | Admitting: Hematology and Oncology

## 2022-11-14 DIAGNOSIS — D539 Nutritional anemia, unspecified: Secondary | ICD-10-CM

## 2022-11-14 NOTE — Telephone Encounter (Signed)
-----   Message from Artis Delay sent at 11/14/2022  2:12 PM EDT ----- Pls call her Reviewed GI notes I recommend follow-up in 3 mo, if she agree, send LOS for labs, see me in 3 months  Thanks

## 2022-11-14 NOTE — Telephone Encounter (Signed)
Called and given below message. She verbalized understanding and agrees to 3 month appt. Scheduling message sent.

## 2022-11-15 ENCOUNTER — Telehealth: Payer: Self-pay | Admitting: Hematology and Oncology

## 2022-11-15 ENCOUNTER — Encounter: Payer: Self-pay | Admitting: Hematology and Oncology

## 2022-11-15 NOTE — Telephone Encounter (Signed)
Spoke with patient confirming upcoming appointments  

## 2022-12-04 ENCOUNTER — Ambulatory Visit: Payer: Medicare Other | Admitting: Podiatry

## 2022-12-04 ENCOUNTER — Encounter: Payer: Self-pay | Admitting: Podiatry

## 2022-12-04 DIAGNOSIS — E1121 Type 2 diabetes mellitus with diabetic nephropathy: Secondary | ICD-10-CM

## 2022-12-04 DIAGNOSIS — M79674 Pain in right toe(s): Secondary | ICD-10-CM

## 2022-12-04 DIAGNOSIS — B351 Tinea unguium: Secondary | ICD-10-CM

## 2022-12-04 DIAGNOSIS — M79675 Pain in left toe(s): Secondary | ICD-10-CM | POA: Diagnosis not present

## 2022-12-04 NOTE — Progress Notes (Signed)
This patient returns to my office for at risk foot care.  This patient requires this care by a professional since this patient will be at risk due to having chronic kidney disease and type 2 diabetes.  This patient is unable to cut nails herself since the patient cannot reach her nails.These nails are painful walking and wearing shoes.  This patient presents for at risk foot care today.  General Appearance  Alert, conversant and in no acute stress.  Vascular  Dorsalis pedis and posterior tibial  pulses are palpable  bilaterally.  Capillary return is within normal limits  bilaterally. Temperature is within normal limits  bilaterally.  Neurologic  Senn-Weinstein monofilament wire test within normal limits  bilaterally. Muscle power within normal limits bilaterally.  Nails Thick disfigured discolored nails with subungual debris  from hallux to fifth toes bilaterally. No evidence of bacterial infection or drainage bilaterally.  Orthopedic  No limitations of motion  feet .  No crepitus or effusions noted.  No bony pathology or digital deformities noted.  HAV  B/L.  Skin  normotropic skin with no porokeratosis noted bilaterally.  No signs of infections or ulcers noted.     Onychomycosis  Pain in right toes  Pain in left toes  Consent was obtained for treatment procedures.   Mechanical debridement of nails 1-5  bilaterally performed with a nail nipper.  Filed with dremel without incident.    Return office visit   9  weeks                   Told patient to return for periodic foot care and evaluation due to potential at risk complications.   Gregory Mayer DPM  

## 2022-12-10 ENCOUNTER — Telehealth: Payer: Self-pay | Admitting: Gastroenterology

## 2022-12-10 ENCOUNTER — Other Ambulatory Visit: Payer: Self-pay | Admitting: *Deleted

## 2022-12-10 DIAGNOSIS — D509 Iron deficiency anemia, unspecified: Secondary | ICD-10-CM

## 2022-12-10 DIAGNOSIS — Z8774 Personal history of (corrected) congenital malformations of heart and circulatory system: Secondary | ICD-10-CM

## 2022-12-10 MED ORDER — NA SULFATE-K SULFATE-MG SULF 17.5-3.13-1.6 GM/177ML PO SOLN: ORAL | 0 refills | Status: AC

## 2022-12-10 NOTE — Addendum Note (Signed)
Addended by: Richardson Chiquito on: 12/10/2022 12:33 PM   Modules accepted: Orders

## 2022-12-10 NOTE — Telephone Encounter (Signed)
Left message for patient to call back. See lab result notes from 11/13/22 for additional correspondence.

## 2022-12-10 NOTE — Telephone Encounter (Signed)
Patient returned your call. Requesting a call back. Please advise.

## 2022-12-11 ENCOUNTER — Encounter: Payer: Self-pay | Admitting: *Deleted

## 2022-12-11 NOTE — Telephone Encounter (Signed)
Patient returned call, please advise.  

## 2022-12-11 NOTE — Telephone Encounter (Signed)
Left message to call back  

## 2022-12-19 NOTE — Progress Notes (Signed)
Agree with assessment/plan.  Raj Florestine Carmical, MD [facility] [phone]  

## 2022-12-31 ENCOUNTER — Encounter: Payer: Self-pay | Admitting: Cardiology

## 2022-12-31 ENCOUNTER — Ambulatory Visit: Payer: Medicare Other | Attending: Cardiology | Admitting: Cardiology

## 2022-12-31 ENCOUNTER — Ambulatory Visit: Payer: Medicare Other | Admitting: Cardiovascular Disease

## 2022-12-31 VITALS — BP 158/62 | HR 81 | Resp 16 | Ht 64.0 in | Wt 142.0 lb

## 2022-12-31 DIAGNOSIS — I10 Essential (primary) hypertension: Secondary | ICD-10-CM

## 2022-12-31 DIAGNOSIS — R0989 Other specified symptoms and signs involving the circulatory and respiratory systems: Secondary | ICD-10-CM | POA: Diagnosis not present

## 2022-12-31 DIAGNOSIS — E782 Mixed hyperlipidemia: Secondary | ICD-10-CM

## 2022-12-31 DIAGNOSIS — R011 Cardiac murmur, unspecified: Secondary | ICD-10-CM | POA: Diagnosis not present

## 2022-12-31 NOTE — Progress Notes (Signed)
Cardiology Office Note:  .   Date:  12/31/2022  ID:  Jamie Mendez, DOB 1933/09/15, MRN [id number] PCP: Artis Delay, MD  [facility] Providers Cardiologist:  Truett Mainland, MD PCP: Artis Delay, MD  Chief Complaint  Patient presents with   Dizziness   systolic murmur    New Patient (Initial Visit)      History of Present Illness: .    Jamie Mendez is a 89 y.o. female with hypertension, anemia, lymphoma treated with chemotherapy, dizziness  Patient lives in independent retirement community, stays active with regular exercise at the [facility].  She denies any complaints of exertional chest pain or shortness of breath.  She had 1 episode of sharp pain in the chest after turning certain way in bed, that lasted for only about a minute or so, and has not occurred since in spite of regular exercise.  She complains of dizziness symptoms when she moves her head quickly, lasting for about minute or so.  She denies any presyncope or syncope.  Blood pressure elevated on first check, but subsequent blood pressure readings, including orthostatics are normal.    Vitals:   12/31/22 1526  BP: (!) 158/62  Pulse: 81  Resp: 16  SpO2: 94%   Orthostatic VS for the past 72 hrs (Last 3 readings):  Orthostatic BP Patient Position BP Location Cuff Size Orthostatic Pulse  12/31/22 1532 129/75 Standing Left Arm Normal 87  12/31/22 1531 134/65 Sitting Left Arm Normal 78  12/31/22 1530 126/63 Supine Left Arm Normal 76     ROS:  Review of Systems  Cardiovascular:  Negative for chest pain, dyspnea on exertion, leg swelling, palpitations and syncope.  Neurological:  Positive for dizziness.     Studies Reviewed: Marland Kitchen        EKG 12/31/2022: Normal sinus rhythm Septal infarct , age undetermined Non-specific intra-ventricular conduction delay No previous ECGs available  Independently interpreted Labs 11/13/2022: Hb 9.5 Cr 1.3  Labs 01/2021: Chol 194, TG 90, HDL 62, LDL 116      Physical Exam:   Physical Exam Vitals and nursing note reviewed.  Constitutional:      General: She is not in acute distress. Neck:     Vascular: No JVD.  Cardiovascular:     Rate and Rhythm: Normal rate and regular rhythm.     Pulses:          Carotid pulses are  on the right side with bruit.    Heart sounds: Murmur heard.     Harsh midsystolic murmur is present with a grade of 2/6 at the upper right sternal border radiating to the neck.  Pulmonary:     Effort: Pulmonary effort is normal.     Breath sounds: Normal breath sounds. No wheezing or rales.  Musculoskeletal:     Right lower leg: No edema.     Left lower leg: No edema.      VISIT DIAGNOSES:   ICD-10-CM   1. Murmur  R01.1 EKG 12-Lead    Lipid Profile    VAS US CAROTID    ECHOCARDIOGRAM COMPLETE    2. Right carotid bruit  R09.89 Lipid Profile    VAS US CAROTID    ECHOCARDIOGRAM COMPLETE    3. Hyperlipidemia  E78.2 Lipid Profile       ASSESSMENT AND PLAN: .    Jamie Mendez is a 89 y.o. female with hypertension, anemia, lymphoma treated with chemotherapy, dizziness  Dizziness: Only mild systolic murmur and mild carotid  bruit.  It is unlikely that these are related to her dizziness.  I will obtain echocardiogram and carotid duplex for completeness sake.  Blood pressure elevated on initial check, but subsequent blood pressure check, including orthostatics are normal.  Continue current antihypertensive medications.  Elevated LDL: Recommend with panel, previously checked in 01/2021.  I will see her as needed, unless any significant abnormalities found on above testing.  Signed, Elder Negus, MD

## 2022-12-31 NOTE — Patient Instructions (Signed)
Medication Instructions:   Your physician recommends that you continue on your current medications as directed. Please refer to the Current Medication list given to you today.  *If you need a refill on your cardiac medications before your next appointment, please call your pharmacy*   Lab Work:  TODAY--LIPIDS--PLEASE GO TO THE FIRST FLOOR OF THIS BUILDING TO [facility] BEFORE 4:30 PM  If you have labs (blood work) drawn today and your tests are completely normal, you will receive your results only by: MyChart Message (if you have MyChart) OR A paper copy in the mail If you have any lab test that is abnormal or we need to change your treatment, we will call you to review the results.   Testing/Procedures:  Your physician has requested that you have an echocardiogram. Echocardiography is a painless test that uses sound waves to create images of your heart. It provides your doctor with information about the size and shape of your heart and how well your heart's chambers and valves are working. This procedure takes approximately one hour. There are no restrictions for this procedure. Please do NOT wear cologne, perfume, aftershave, or lotions (deodorant is allowed). Please arrive 15 minutes prior to your appointment time.  Please note: We ask at that you not bring children with you during ultrasound (echo/ vascular) testing. Due to room size and safety concerns, children are not allowed in the ultrasound rooms during exams. Our front office staff cannot provide observation of children in our lobby area while testing is being conducted. An adult accompanying a patient to their appointment will only be allowed in the ultrasound room at the discretion of the ultrasound technician under special circumstances. We apologize for any inconvenience.    Your physician has requested that you have a carotid duplex. This test is an ultrasound of the carotid arteries in your neck. It looks at blood flow  through these arteries that supply the brain with blood. Allow one hour for this exam. There are no restrictions or special instructions.    Follow-Up:  AS NEEDED WITH DR. PATWARDHAN

## 2023-01-01 LAB — LIPID PANEL
Chol/HDL Ratio: 3 {ratio} (ref 0.0–4.4)
Cholesterol, Total: 153 mg/dL (ref 100–199)
HDL: 51 mg/dL (ref >39)
LDL Chol Calc (NIH): 84 mg/dL (ref 0–99)
Triglycerides: 97 mg/dL (ref 0–149)
VLDL Cholesterol Cal: 18 mg/dL (ref 5–40)

## 2023-01-13 ENCOUNTER — Ambulatory Visit (HOSPITAL_COMMUNITY)
Admission: RE | Admit: 2023-01-13 | Discharge: 2023-01-13 | Disposition: A | Discharge status: Home / Self Care | Payer: Medicare Other | Source: Ambulatory Visit | Attending: Cardiovascular Disease | Admitting: Cardiovascular Disease

## 2023-01-13 DIAGNOSIS — R011 Cardiac murmur, unspecified: Secondary | ICD-10-CM | POA: Diagnosis present

## 2023-01-13 DIAGNOSIS — R0989 Other specified symptoms and signs involving the circulatory and respiratory systems: Secondary | ICD-10-CM | POA: Insufficient documentation

## 2023-01-13 NOTE — Progress Notes (Signed)
Minimal disease in the carotids, follow-up as necessary.  No further cardiac testing is indicated.

## 2023-02-04 ENCOUNTER — Ambulatory Visit (HOSPITAL_COMMUNITY): Payer: Medicare Other | Attending: Cardiology

## 2023-02-04 DIAGNOSIS — R011 Cardiac murmur, unspecified: Secondary | ICD-10-CM | POA: Diagnosis present

## 2023-02-04 DIAGNOSIS — R0989 Other specified symptoms and signs involving the circulatory and respiratory systems: Secondary | ICD-10-CM | POA: Diagnosis not present

## 2023-02-04 LAB — ECHOCARDIOGRAM COMPLETE
Area-P 1/2: 5.97 cm2
S' Lateral: 2.1 cm

## 2023-02-05 ENCOUNTER — Encounter: Payer: Self-pay | Admitting: Podiatry

## 2023-02-05 ENCOUNTER — Ambulatory Visit: Payer: Medicare Other | Admitting: Podiatry

## 2023-02-05 VITALS — Ht 64.0 in | Wt 142.0 lb

## 2023-02-05 DIAGNOSIS — B351 Tinea unguium: Secondary | ICD-10-CM | POA: Diagnosis not present

## 2023-02-05 DIAGNOSIS — M79675 Pain in left toe(s): Secondary | ICD-10-CM

## 2023-02-05 DIAGNOSIS — E1121 Type 2 diabetes mellitus with diabetic nephropathy: Secondary | ICD-10-CM

## 2023-02-05 DIAGNOSIS — M79674 Pain in right toe(s): Secondary | ICD-10-CM | POA: Diagnosis not present

## 2023-02-05 NOTE — Progress Notes (Signed)
This patient returns to my office for at risk foot care.  This patient requires this care by a professional since this patient will be at risk due to having chronic kidney disease and type 2 diabetes.  This patient is unable to cut nails herself since the patient cannot reach her nails.These nails are painful walking and wearing shoes.  This patient presents for at risk foot care today.  General Appearance  Alert, conversant and in no acute stress.  Vascular  Dorsalis pedis and posterior tibial  pulses are palpable  bilaterally.  Capillary return is within normal limits  bilaterally. Temperature is within normal limits  bilaterally.  Neurologic  Senn-Weinstein monofilament wire test within normal limits  bilaterally. Muscle power within normal limits bilaterally.  Nails Thick disfigured discolored nails with subungual debris  from hallux to fifth toes bilaterally. No evidence of bacterial infection or drainage bilaterally.  Orthopedic  No limitations of motion  feet .  No crepitus or effusions noted.  No bony pathology or digital deformities noted.  HAV  B/L.  Skin  normotropic skin with no porokeratosis noted bilaterally.  No signs of infections or ulcers noted.     Onychomycosis  Pain in right toes  Pain in left toes  Consent was obtained for treatment procedures.   Mechanical debridement of nails 1-5  bilaterally performed with a nail nipper.  Filed with dremel without incident.    Return office visit   9  weeks                   Told patient to return for periodic foot care and evaluation due to potential at risk complications.   Gregory Mayer DPM  

## 2023-02-06 ENCOUNTER — Telehealth: Payer: Self-pay

## 2023-02-06 DIAGNOSIS — I071 Rheumatic tricuspid insufficiency: Secondary | ICD-10-CM

## 2023-02-06 DIAGNOSIS — I340 Nonrheumatic mitral (valve) insufficiency: Secondary | ICD-10-CM

## 2023-02-06 NOTE — Telephone Encounter (Signed)
 [facility] Pre-operative Risk Assessment     Request for surgical clearance:     Endoscopy Procedure  What type of surgery is being performed?     EGD & Colon  When is this surgery scheduled?     02/24/23  What type of clearance is required ?   Cardiac  Are there any medications that need to be held prior to surgery and how long?   Practice name and name of physician performing surgery?      [facility], Dr. Charlanne  What is your office phone and fax number?      Phone- [phone]  Fax- [phone]  Anesthesia type (None, local, MAC, general) ?       MAC   Please route your response to Nyla Basket, RN

## 2023-02-06 NOTE — Telephone Encounter (Signed)
   Name: Jamie Mendez  DOB: [birth year]  MRN: [id number]   Primary Cardiologist: None  Chart reviewed as part of pre-operative protocol coverage. Jamie Mendez was last seen on 12/31/2022 by Dr. Elmira.  Per Dr. Elmira There are certain abnormal findings on echocardiogram as separately described, that need follow up. However, they should not preclude performing EGD and colonoscopy.,  Intermediate and acceptable cardiac risk for EGD and colonoscopy.  I will route this recommendation to the requesting party via Epic fax function and remove from pre-op pool. Please call with questions.  Barnie Hila, NP 02/06/2023, 5:02 PM

## 2023-02-06 NOTE — Telephone Encounter (Signed)
 There are certain abnormal findings on echocardiogram as separately described, that need follow up. However, they should not preclude performing EGD and colonoscopy., Intermediate and acceptable cardiac risk for EGD and colonoscopy.  Thanks MJP

## 2023-02-06 NOTE — Telephone Encounter (Signed)
-----   Message from [facility] sent at 02/06/2023 11:11 AM EST ----- Normal heart function, moderate leakiness of mitral and tricuspid valves with moderately elevated pressure on right side of the heart.  While I do not think this explains her initial complaint of dizziness, it is reasonable to monitor this with repeat echocardiogram in 6 months, around June or July 2025, and then follow-up with me.  Thanks MJP

## 2023-02-06 NOTE — Telephone Encounter (Signed)
 The patient has been notified of the result and verbalized understanding.  All questions (if any) were answered. Ethelda Chick, RN 02/06/2023 12:28 PM   Placed order for echo.

## 2023-02-06 NOTE — Telephone Encounter (Signed)
 Dr. Elmira,   You saw this patient on 12/31/2022. She underwent an echo today. Per office protocol, will you please comment on medical clearance for EGD and colonoscopy scheduled for 02/24/2023?  Please route your response to P CV DIV Preop. I will communicate with requesting office once you have given recommendations.   Thank you!  Barnie Hila, NP

## 2023-02-10 NOTE — Telephone Encounter (Signed)
Please see notes below and advise 

## 2023-02-11 NOTE — Telephone Encounter (Signed)
 Called patient, had to leave message.  We were scheduling EGD/Colon for IDA, most recent ENT note states she does not want endoscopic evaluation, please confirm with patient or set up follow up visit before endoscopic evaluation.  Thanks, Marchelle Folks

## 2023-02-11 NOTE — Telephone Encounter (Signed)
 Reviewed ENT note from 12/4 Patient wants to hold off on any endoscopic procedures RG  Marchelle Folks, Can you please make sure RG

## 2023-02-14 ENCOUNTER — Encounter (HOSPITAL_COMMUNITY): Payer: Self-pay | Admitting: Gastroenterology

## 2023-02-14 NOTE — Telephone Encounter (Signed)
Left message for pt to call back  °

## 2023-02-17 NOTE — Telephone Encounter (Signed)
Unable to leave message. Pt voice mail box is full.

## 2023-02-18 NOTE — Telephone Encounter (Signed)
Unable to leave message. Pt voice mail box is full.  My Chart message was sent to pt.

## 2023-02-18 NOTE — Telephone Encounter (Signed)
Spoke with pt. Pt stated that she would proceed with the procedures that are scheduled for Monday. 02/24/2023. Pt was also scheduled for an office visit on 03/21/2023 at 10:00 to see Quentin Mulling PA for a follow up at 10:00 AM. Pt made aware. Pt verbalized understanding with all questions answered.

## 2023-02-20 ENCOUNTER — Inpatient Hospital Stay: Payer: Medicare Other | Attending: Hematology and Oncology | Admitting: Hematology and Oncology

## 2023-02-20 ENCOUNTER — Encounter: Payer: Self-pay | Admitting: Hematology and Oncology

## 2023-02-20 ENCOUNTER — Inpatient Hospital Stay: Payer: Medicare Other

## 2023-02-20 VITALS — BP 158/61 | HR 98 | Temp 98.0°F | Resp 18 | Ht 64.0 in | Wt 141.4 lb

## 2023-02-20 DIAGNOSIS — D500 Iron deficiency anemia secondary to blood loss (chronic): Secondary | ICD-10-CM | POA: Diagnosis not present

## 2023-02-20 DIAGNOSIS — C8580 Other specified types of non-Hodgkin lymphoma, unspecified site: Secondary | ICD-10-CM

## 2023-02-20 DIAGNOSIS — D539 Nutritional anemia, unspecified: Secondary | ICD-10-CM

## 2023-02-20 DIAGNOSIS — Z8572 Personal history of non-Hodgkin lymphomas: Secondary | ICD-10-CM | POA: Diagnosis not present

## 2023-02-20 DIAGNOSIS — D630 Anemia in neoplastic disease: Secondary | ICD-10-CM

## 2023-02-20 LAB — SAMPLE TO BLOOD BANK

## 2023-02-20 LAB — BASIC METABOLIC PANEL - CANCER CENTER ONLY
Anion gap: 9 (ref 5–15)
BUN: 30 mg/dL — ABNORMAL HIGH (ref 8–23)
CO2: 29 mmol/L (ref 22–32)
Calcium: 9.4 mg/dL (ref 8.9–10.3)
Chloride: 100 mmol/L (ref 98–111)
Creatinine: 1.48 mg/dL — ABNORMAL HIGH (ref 0.44–1.00)
GFR, Estimated: 34 mL/min — ABNORMAL LOW (ref >60)
Glucose, Bld: 339 mg/dL — ABNORMAL HIGH (ref 70–99)
Potassium: 4.2 mmol/L (ref 3.5–5.1)
Sodium: 138 mmol/L (ref 135–145)

## 2023-02-20 LAB — CBC WITH DIFFERENTIAL/PLATELET
Abs Immature Granulocytes: 0.04 10*3/uL (ref 0.00–0.07)
Basophils Absolute: 0.1 10*3/uL (ref 0.0–0.1)
Basophils Relative: 1 %
Eosinophils Absolute: 0.1 10*3/uL (ref 0.0–0.5)
Eosinophils Relative: 1 %
HCT: 29.2 % — ABNORMAL LOW (ref 36.0–46.0)
Hemoglobin: 9.4 g/dL — ABNORMAL LOW (ref 12.0–15.0)
Immature Granulocytes: 1 %
Lymphocytes Relative: 17 %
Lymphs Abs: 1.1 10*3/uL (ref 0.7–4.0)
MCH: 25.3 pg — ABNORMAL LOW (ref 26.0–34.0)
MCHC: 32.2 g/dL (ref 30.0–36.0)
MCV: 78.7 fL — ABNORMAL LOW (ref 80.0–100.0)
Monocytes Absolute: 0.4 10*3/uL (ref 0.1–1.0)
Monocytes Relative: 7 %
Neutro Abs: 4.8 10*3/uL (ref 1.7–7.7)
Neutrophils Relative %: 73 %
Platelets: 216 10*3/uL (ref 150–400)
RBC: 3.71 MIL/uL — ABNORMAL LOW (ref 3.87–5.11)
RDW: 14.8 % (ref 11.5–15.5)
WBC: 6.4 10*3/uL (ref 4.0–10.5)
nRBC: 0 % (ref 0.0–0.2)

## 2023-02-20 LAB — FERRITIN: Ferritin: 220 ng/mL (ref 11–307)

## 2023-02-20 LAB — IRON AND IRON BINDING CAPACITY (CC-WL,HP ONLY)
Iron: 21 ug/dL — ABNORMAL LOW (ref 28–170)
Saturation Ratios: 9 % — ABNORMAL LOW (ref 10.4–31.8)
TIBC: 234 ug/dL — ABNORMAL LOW (ref 250–450)
UIBC: 213 ug/dL (ref 148–442)

## 2023-02-20 LAB — VITAMIN B12: Vitamin B-12: 655 pg/mL (ref 180–914)

## 2023-02-20 NOTE — Assessment & Plan Note (Signed)
Anemia did not improve much with intravenous iron replacement. I suspect that is a component of anemia of chronic kidney disease It is possible she might still having ongoing internal bleeding requiring more intravenous iron infusion I will wait for test results to come back and we will call her to see if she would qualify for further doses of intravenous iron I recommend the patient to proceed with endoscopy evaluation

## 2023-02-20 NOTE — Assessment & Plan Note (Signed)
CT imaging from September 2024 showed no evidence of recurrent lymphoma Continue observation

## 2023-02-20 NOTE — Progress Notes (Signed)
[facility] OFFICE PROGRESS NOTE  Patient Care Team: Artis Delay, MD as PCP - General (Hematology and Oncology) Elise Benne, MD as Consulting Physician (Ophthalmology) Nils Flack, MD as Referring Physician (Dermatology) Artis Delay, MD as Consulting Physician (Hematology and Oncology)  ASSESSMENT & PLAN:  Iron deficiency anemia due to chronic blood loss Anemia did not improve much with intravenous iron replacement. I suspect that is a component of anemia of chronic kidney disease It is possible she might still having ongoing internal bleeding requiring more intravenous iron infusion I will wait for test results to come back and we will call her to see if she would qualify for further doses of intravenous iron I recommend the patient to proceed with endoscopy evaluation  Marginal zone lymphoma CT imaging from September 2024 showed no evidence of recurrent lymphoma Continue observation  No orders of the defined types were placed in this encounter.   All questions were answered. The patient knows to call the clinic with any problems, questions or concerns. The total time spent in the appointment was 20 minutes encounter with patients including review of chart and various tests results, discussions about plan of care and coordination of care plan   Artis Delay, MD 02/20/2023 12:44 PM  INTERVAL HISTORY: Please see below for problem oriented charting. she returns for surveillance follow-up and review test results She has multiple appointments She just saw her cardiologist recently and have endoscopy evaluation pending We discussed test results She denies overt bleeding  REVIEW OF SYSTEMS:   Constitutional: Denies fevers, chills or abnormal weight loss Eyes: Denies blurriness of vision Ears, nose, mouth, throat, and face: Denies mucositis or sore throat Respiratory: Denies cough, dyspnea or wheezes Cardiovascular: Denies palpitation, chest discomfort or lower  extremity swelling Gastrointestinal:  Denies nausea, heartburn or change in bowel habits Skin: Denies abnormal skin rashes Lymphatics: Denies new lymphadenopathy or easy bruising Neurological:Denies numbness, tingling or new weaknesses Behavioral/Psych: Mood is stable, no new changes  All other systems were reviewed with the patient and are negative.  I have reviewed the past medical history, past surgical history, social history and family history with the patient and they are unchanged from previous note.  ALLERGIES:  is allergic to aspirin, capoten [captopril], glyburide, insulin detemir, other, saxagliptin, statins, and zocor [simvastatin].  MEDICATIONS:  Current Outpatient Medications  Medication Sig Dispense Refill   Accu-Chek FastClix Lancets MISC USE 1 TO CHECK GLUCOSE ONCE DAILY     acyclovir (ZOVIRAX) 400 MG tablet Take 1 tablet by mouth once daily 30 tablet 11   alendronate (FOSAMAX) 70 MG tablet Take 70 mg by mouth once a week.     amLODipine (NORVASC) 10 MG tablet TAKE 1 TABLET BY MOUTH AT BEDTIME 90 tablet 0   Blood Glucose Monitoring Suppl (ACCU-CHEK GUIDE) w/Device KIT See admin instructions.     calcium carbonate (OSCAL) 1500 (600 Ca) MG TABS tablet 1 tablet with meals     cholecalciferol (VITAMIN D) 1000 UNITS tablet Take 1,000 Units by mouth daily.      enalapril (VASOTEC) 20 MG tablet Take 20 mg by mouth every morning.     ezetimibe (ZETIA) 10 MG tablet Take 1 tablet by mouth daily.     fish oil-omega-3 fatty acids 1000 MG capsule Take 1 g by mouth daily.      glimepiride (AMARYL) 4 MG tablet Take 4 mg by mouth daily with breakfast. Per patient takes in am and pm.     glucose blood test strip Use  as instructed 100 each 12   JARDIANCE 10 MG TABS tablet Take 10 mg by mouth daily.     Magnesium 500 MG CAPS Take 500 mg by mouth daily.      metFORMIN (GLUCOPHAGE-XR) 500 MG 24 hr tablet SMARTSIG:1 Tablet(s) By Mouth Morning-Evening     Misc Natural Products (LUTEIN 20 PO)  Take by mouth daily.     Multiple Vitamins-Minerals (CENTRUM CARDIO) TABS Take 1 tablet by mouth daily.     Multiple Vitamins-Minerals (ICAPS) CAPS Take by mouth daily.     Na Sulfate-K Sulfate-Mg Sulf 17.5-3.13-1.6 GM/177ML SOLN Use as directed; may use generic; goodrx card if insurance will not cover generic 354 mL 0   No current facility-administered medications for this visit.    SUMMARY OF ONCOLOGIC HISTORY: Oncology History Overview Note  Marginal zone lymphoma, with lymph node and bone marrow involvement along with splenomegaly   Primary site: Lymphoid Neoplasms   Staging method: AJCC 6th Edition   Clinical: Stage IV signed by Artis Delay, MD on 09/02/2013  7:38 PM   Summary: Stage IV     Marginal zone lymphoma (HCC)  07/29/2013 Imaging   CT scan shows splenomegaly and abnormal lymphadenopathy.   08/13/2013 Imaging   PET CT scan confirmed lymphadenopathy and splenomegaly, those areas are PET avid   08/23/2013 Surgery   [id number] left axillary lymph node biopsy confirmed a low-grade lymphoma.   08/27/2013 Bone Marrow Biopsy   Bone marrow biopsy confirmed lymphoma.[id number]   09/08/2013 - 09/29/2013 Chemotherapy   She completed 4 cycles of rituximab with resolution of splenomegaly.   12/29/2013 Imaging   PET scan show complete response.   07/11/2014 Imaging   PET scan showed recurrent disease   07/19/2014 - 08/09/2014 Chemotherapy   She completed 4 cycles of rituximab with resolution of splenomegaly   10/12/2014 Imaging   Repeat PET/CT scan showed near complete response to treatment.   10/13/2014 - 02/09/2015 Chemotherapy   She is placed on rituximab maintenance therapy.   03/29/2015 Imaging   PET CT showed possible mild progression of splenomegaly   04/26/2015 - 02/22/2016 Chemotherapy   She started taking Ibrutinib   04/28/2015 Adverse Reaction   Treatment was placed temporarily on hold due to severe diarrhea and resumed at reduced dose   07/03/2015 PET scan   No  hypermetabolic adenopathy in the neck, chest, abdomen or pelvis.2. Spleen is not hypermetabolic and appears slightly less prominent size.   02/06/2016 PET scan   Hypermetabolic LEFT axillary lymph nodes. Concern for HIGH-GRADE LYMPHOMA LOCALIZED RECURRENCE. 2. Prominent spleen without hypermetabolic activity. No change in size from comparison. 3. No additional hypermetabolic adenopathy on scan.   02/19/2016 Procedure   Technically successful ultrasound-guided core biopsy, left axillary adenopathy.   02/19/2016 Pathology Results   Lymph node, needle/core biopsy, left axilla - ATYPICAL LYMPHOID PROLIFERATION SUSPICIOUS FOR LYMPHOMA. - SEE COMMENT. Microscopic Comment The sections show multiple very small and skinny fragments of lymph nodal tissue displaying areas of lymphoid expansion by a population of small to medium size lymphocytes with round to irregular nuclei, scanty to moderately abundant cytoplasm, dense chromatin and inconspicuous nucleoli. This is associated with scattering of "naked" germinal centers with abundant tingible body macrophages. To further evaluate this process, flow cytometric analysis was performed and shows a minor population of monoclonal B cells expressing pan B-cell antigens including CD20 with associated kappa light chain restriction. In addition, immunohistochemical stains were performed including CD20, CD79a, CD3, CD5, CD10, BCL-2, CD21 with appropriate controls. The stains show  a mixture of T and B cells with slight predominance of B cells. CD21 highlights numerous areas with dendritic networks, correlating with the presence of germinal centers. CD10 shows focal positivity likely in germinal centers. BCL-2 is diffusely positive except in areas of apparent germinal centers. The overall features are atypical and suspicious for involvement by a B-cell lymphoproliferative process, particularly given the B-cell monoclonality by flow cytometry. However, morphologic evaluation is  extremely limited due to small biopsy fragments and additional material (excisional biopsy) is strongly recommended for further evaluation. (BNS:ecj 02/21/2016)    03/04/2016 Procedure   Placement of single lumen port a cath via right internal jugular vein. The catheter tip lies at the cavo-atrial junction. A power injectable port a cath was placed and is ready for immediate use.   03/07/2016 - 05/31/2016 Chemotherapy   She received treatment with bendamustine and Gazyva   05/27/2016 PET scan   1. No evidence of active lymphoma on whole-body scan. 2. Resolution of metabolic activity previously identified in the LEFT axilla. 3. Small amount free fluid the pelvis.   03/07/2017 PET scan   1. No findings of active lymphoma in the chest, abdomen, or pelvis. 2. Other imaging findings of potential clinical significance: Aortic Atherosclerosis (ICD10-I70.0). Coronary atherosclerosis. Chronic paranasal sinusitis. Moderately distended gallbladder, chronic   05/02/2020 Procedure   Removal of implanted Port-A-Cath utilizing sharp and blunt dissection. The procedure was uncomplicated.   09/24/2021 Imaging   1. No adenopathy above or below the diaphragm and no splenomegaly and no suspicious soft tissue or osseous lesion identified to suggest lymphomatous disease recurrence. 2. Small hiatal hernia with symmetric distal esophageal wall thickening and reflux/retained contrast in the distal esophagus suggestive of reflux esophagitis consider further evaluation with endoscopy if clinically indicated. 3. Distended gallbladder without CT findings of acute cholecystitis, commonly reflects fasting state, suggest correlation with right upper quadrant tenderness to palpation. 4. Moderate volume of formed stool throughout the colon as can be seen with constipation. 5.  Aortic Atherosclerosis (ICD10-I70.0).     PHYSICAL EXAMINATION: ECOG PERFORMANCE STATUS: 1 - Symptomatic but completely ambulatory  Vitals:   02/20/23 1204   BP: (!) 158/61  Pulse: 98  Resp: 18  Temp: 98 F (36.7 C)  SpO2: 98%   Filed Weights   02/20/23 1204  Weight: 141 lb 6.4 oz (64.1 kg)    GENERAL:alert, no distress and comfortable  LABORATORY DATA:  I have reviewed the data as listed    Component Value Date/Time   NA 138 02/20/2023 1116   NA 136 01/07/2017 1004   K 4.2 02/20/2023 1116   K 4.5 01/07/2017 1004   CL 100 02/20/2023 1116   CO2 29 02/20/2023 1116   CO2 23 01/07/2017 1004   GLUCOSE 339 (H) 02/20/2023 1116   GLUCOSE 217 (H) 01/07/2017 1004   BUN 30 (H) 02/20/2023 1116   BUN 23.3 01/07/2017 1004   CREATININE 1.48 (H) 02/20/2023 1116   CREATININE 1.2 (H) 01/07/2017 1004   CALCIUM 9.4 02/20/2023 1116   CALCIUM 9.4 01/07/2017 1004   PROT 6.7 11/13/2022 1451   PROT 6.7 01/07/2017 1004   ALBUMIN 3.8 11/13/2022 1451   ALBUMIN 3.9 01/07/2017 1004   AST 19 11/13/2022 1451   AST 11 (L) 10/18/2022 1341   AST 20 01/07/2017 1004   ALT 18 11/13/2022 1451   ALT 12 10/18/2022 1341   ALT 14 01/07/2017 1004   ALKPHOS 118 (H) 11/13/2022 1451   ALKPHOS 70 01/07/2017 1004   BILITOT 0.4 11/13/2022 1451  BILITOT 0.4 10/18/2022 1341   BILITOT 0.49 01/07/2017 1004   GFRNONAA 34 (L) 02/20/2023 1116   GFRNONAA 47 (L) 05/18/2013 1256   GFRAA 40 (L) 09/28/2019 1053   GFRAA 41 (L) 07/06/2018 0959   GFRAA 55 (L) 05/18/2013 1256    No results found for: "SPEP", "UPEP"  Lab Results  Component Value Date   WBC 6.4 02/20/2023   NEUTROABS 4.8 02/20/2023   HGB 9.4 (L) 02/20/2023   HCT 29.2 (L) 02/20/2023   MCV 78.7 (L) 02/20/2023   PLT 216 02/20/2023      Chemistry      Component Value Date/Time   NA 138 02/20/2023 1116   NA 136 01/07/2017 1004   K 4.2 02/20/2023 1116   K 4.5 01/07/2017 1004   CL 100 02/20/2023 1116   CO2 29 02/20/2023 1116   CO2 23 01/07/2017 1004   BUN 30 (H) 02/20/2023 1116   BUN 23.3 01/07/2017 1004   CREATININE 1.48 (H) 02/20/2023 1116   CREATININE 1.2 (H) 01/07/2017 1004   GLU 202 (H)  08/09/2014 1000      Component Value Date/Time   CALCIUM 9.4 02/20/2023 1116   CALCIUM 9.4 01/07/2017 1004   ALKPHOS 118 (H) 11/13/2022 1451   ALKPHOS 70 01/07/2017 1004   AST 19 11/13/2022 1451   AST 11 (L) 10/18/2022 1341   AST 20 01/07/2017 1004   ALT 18 11/13/2022 1451   ALT 12 10/18/2022 1341   ALT 14 01/07/2017 1004   BILITOT 0.4 11/13/2022 1451   BILITOT 0.4 10/18/2022 1341   BILITOT 0.49 01/07/2017 1004       RADIOGRAPHIC STUDIES: I have personally reviewed the radiological images as listed and agreed with the findings in the report. ECHOCARDIOGRAM COMPLETE Result Date: 02/04/2023    ECHOCARDIOGRAM REPORT   Patient Name:   Jamie Mendez Date of Exam: 02/04/2023 Medical Rec #:  [id number]         Height:       64.0 in Accession #:    [id number]        Weight:       142.0 lb Date of Birth:  Apr 11, 1933         BSA:          1.691 m Patient Age:    89 years          BP:           166/81 mmHg Patient Gender: F                 HR:           91 bpm. Exam Location:  [address] Procedure: 2D Echo and 3D Echo Indications:    R01.1 Murmur  History:        Patient has no prior history of Echocardiogram examinations.                 Signs/Symptoms:Dizziness/Lightheadedness; Risk Factors:HLD.  Sonographer:    Clearence Ped RCS Referring Phys: [id number] [facility] J PATWARDHAN IMPRESSIONS  1. Left ventricular ejection fraction, by estimation, is 60 to 65%. The left ventricle has normal function. The left ventricle has no regional wall motion abnormalities. Left ventricular diastolic parameters are consistent with Grade I diastolic dysfunction (impaired relaxation). The average left ventricular global longitudinal strain is -20.5 %. The global longitudinal strain is normal.  2. Right ventricular systolic function is normal. The right ventricular size is mildly enlarged. There is moderately elevated pulmonary artery systolic pressure. The estimated  right ventricular systolic pressure is 47.1 mmHg.  3.  Left atrial size was mildly dilated.  4. The mitral valve is normal in structure. Moderate mitral valve regurgitation. No evidence of mitral stenosis. Moderate mitral annular calcification.  5. Tricuspid valve regurgitation is moderate.  6. The aortic valve is tricuspid. There is moderate calcification of the aortic valve. There is mild thickening of the aortic valve. Aortic valve regurgitation is not visualized. Aortic valve sclerosis is present, with no evidence of aortic valve stenosis.  7. The inferior vena cava is normal in size with greater than 50% respiratory variability, suggesting right atrial pressure of 3 mmHg. FINDINGS  Left Ventricle: Left ventricular ejection fraction, by estimation, is 60 to 65%. The left ventricle has normal function. The left ventricle has no regional wall motion abnormalities. The average left ventricular global longitudinal strain is -20.5 %. The global longitudinal strain is normal. 3D ejection fraction reviewed and evaluated as part of the interpretation. Alternate measurement of EF is felt to be most reflective of LV function. The left ventricular internal cavity size was normal in size. There is no left ventricular hypertrophy. Left ventricular diastolic parameters are consistent with Grade I diastolic dysfunction (impaired relaxation). Right Ventricle: The right ventricular size is mildly enlarged. No increase in right ventricular wall thickness. Right ventricular systolic function is normal. There is moderately elevated pulmonary artery systolic pressure. The tricuspid regurgitant velocity is 3.32 m/s, and with an assumed right atrial pressure of 3 mmHg, the estimated right ventricular systolic pressure is 47.1 mmHg. Left Atrium: Left atrial size was mildly dilated. Right Atrium: Right atrial size was normal in size. Pericardium: There is no evidence of pericardial effusion. Mitral Valve: The mitral valve is normal in structure. Moderate mitral annular calcification.  Moderate mitral valve regurgitation. No evidence of mitral valve stenosis. Tricuspid Valve: The tricuspid valve is normal in structure. Tricuspid valve regurgitation is moderate . No evidence of tricuspid stenosis. Aortic Valve: The aortic valve is tricuspid. There is moderate calcification of the aortic valve. There is mild thickening of the aortic valve. Aortic valve regurgitation is not visualized. Aortic valve sclerosis is present, with no evidence of aortic valve stenosis. Pulmonic Valve: The pulmonic valve was normal in structure. Pulmonic valve regurgitation is not visualized. No evidence of pulmonic stenosis. Aorta: The aortic root is normal in size and structure. Venous: The inferior vena cava is normal in size with greater than 50% respiratory variability, suggesting right atrial pressure of 3 mmHg. IAS/Shunts: No atrial level shunt detected by color flow Doppler.  LEFT VENTRICLE PLAX 2D LVIDd:         3.00 cm   Diastology LVIDs:         2.10 cm   LV e' medial:    5.33 cm/s LV PW:         0.90 cm   LV E/e' medial:  17.5 LV IVS:        0.90 cm   LV e' lateral:   9.46 cm/s LVOT diam:     1.80 cm   LV E/e' lateral: 9.9 LV SV:         80 LV SV Index:   47        2D Longitudinal Strain LVOT Area:     2.54 cm  2D Strain GLS (A2C):   -25.3 %                          2D Strain GLS (  A3C):   -18.2 %                          2D Strain GLS (A4C):   -18.0 %                          2D Strain GLS Avg:     -20.5 %                           3D Volume EF:                          3D EF:        54 %                          LV EDV:       91 ml                          LV ESV:       42 ml                          LV SV:        50 ml RIGHT VENTRICLE RV Basal diam:  4.20 cm RV Mid diam:    3.30 cm RV S prime:     17.30 cm/s TAPSE (M-mode): 1.9 cm RVSP:           47.1 mmHg LEFT ATRIUM             Index        RIGHT ATRIUM           Index LA diam:        3.90 cm 2.31 cm/m   RA Pressure: 3.00 mmHg LA Vol (A2C):   62.7 ml 37.07  ml/m  RA Area:     14.40 cm LA Vol (A4C):   35.5 ml 20.99 ml/m  RA Volume:   36.40 ml  21.52 ml/m LA Biplane Vol: 46.5 ml 27.49 ml/m  AORTIC VALVE LVOT Vmax:   186.00 cm/s LVOT Vmean:  116.000 cm/s LVOT VTI:    0.313 m  AORTA Ao Root diam: 2.90 cm Ao Asc diam:  3.30 cm MITRAL VALVE                TRICUSPID VALVE MV Area (PHT):              TR Peak grad:   44.1 mmHg MV Decel Time:              TR Vmax:        332.00 cm/s MV E velocity: 93.30 cm/s   Estimated RAP:  3.00 mmHg MV A velocity: 146.00 cm/s  RVSP:           47.1 mmHg MV E/A ratio:  0.64                             SHUNTS                             Systemic VTI:  0.31 m  Systemic Diam: 1.80 cm Donato Schultz MD Electronically signed by Donato Schultz MD Signature Date/Time: 02/04/2023/10:51:21 AM    Final

## 2023-02-21 ENCOUNTER — Telehealth: Payer: Self-pay | Admitting: Hematology and Oncology

## 2023-02-21 ENCOUNTER — Other Ambulatory Visit: Payer: Self-pay | Admitting: Hematology and Oncology

## 2023-02-21 ENCOUNTER — Telehealth: Payer: Self-pay | Admitting: Gastroenterology

## 2023-02-21 DIAGNOSIS — D509 Iron deficiency anemia, unspecified: Secondary | ICD-10-CM

## 2023-02-21 DIAGNOSIS — D500 Iron deficiency anemia secondary to blood loss (chronic): Secondary | ICD-10-CM

## 2023-02-21 LAB — ERYTHROPOIETIN: Erythropoietin: 30.2 m[IU]/mL — ABNORMAL HIGH (ref 2.6–18.5)

## 2023-02-21 MED ORDER — METFORMIN HCL 500 MG PO TABS: 500.0000 mg | ORAL_TABLET | Freq: Two times a day (BID) | ORAL | 1 refills | Status: AC

## 2023-02-21 NOTE — Telephone Encounter (Signed)
Inbound call from patient requesting a call to discuss prep instructions for 1/27 procedure. Please advise, thank you.

## 2023-02-21 NOTE — Telephone Encounter (Signed)
Unable to reach pt. Pt voice mailbox is full and unable to leave message at this time.

## 2023-02-21 NOTE — Telephone Encounter (Signed)
Spoke with patient confirming upcoming appointment

## 2023-02-21 NOTE — Telephone Encounter (Signed)
I review test results with her There is component of iron deficiency as well as anemia chronic illness likely secondary to chronic kidney disease and uncontrolled diabetes After long discussion, the patient is willing to get her diabetes better controlled Per patient preference, I have increased her metformin to 500 mg twice daily until she sees her primary care doctor in March I recommend 1 dose of intravenous iron infusion and I plan to see her back in 3 months She expressed understanding

## 2023-02-21 NOTE — Telephone Encounter (Deleted)
Inbound call from patient requesting a call to discuss prep instructions for 1/27 procedure. Please advise, thank you.

## 2023-02-24 ENCOUNTER — Encounter (HOSPITAL_COMMUNITY)
Admission: RE | Disposition: A | Discharge status: Home / Self Care | Payer: Self-pay | Source: Home / Self Care | Attending: Gastroenterology

## 2023-02-24 ENCOUNTER — Encounter (HOSPITAL_COMMUNITY): Payer: Self-pay | Admitting: Gastroenterology

## 2023-02-24 ENCOUNTER — Other Ambulatory Visit: Payer: Self-pay

## 2023-02-24 ENCOUNTER — Ambulatory Visit (HOSPITAL_COMMUNITY): Payer: Medicare Other | Admitting: Anesthesiology

## 2023-02-24 ENCOUNTER — Ambulatory Visit (HOSPITAL_COMMUNITY)
Admission: RE | Admit: 2023-02-24 | Discharge: 2023-02-24 | Disposition: A | Discharge status: Home / Self Care | Payer: Medicare Other | Attending: Gastroenterology | Admitting: Gastroenterology

## 2023-02-24 DIAGNOSIS — N183 Chronic kidney disease, stage 3 unspecified: Secondary | ICD-10-CM

## 2023-02-24 DIAGNOSIS — I129 Hypertensive chronic kidney disease with stage 1 through stage 4 chronic kidney disease, or unspecified chronic kidney disease: Secondary | ICD-10-CM | POA: Diagnosis not present

## 2023-02-24 DIAGNOSIS — Z8774 Personal history of (corrected) congenital malformations of heart and circulatory system: Secondary | ICD-10-CM

## 2023-02-24 DIAGNOSIS — K640 First degree hemorrhoids: Secondary | ICD-10-CM

## 2023-02-24 DIAGNOSIS — D127 Benign neoplasm of rectosigmoid junction: Secondary | ICD-10-CM | POA: Diagnosis not present

## 2023-02-24 DIAGNOSIS — Z79899 Other long term (current) drug therapy: Secondary | ICD-10-CM | POA: Diagnosis not present

## 2023-02-24 DIAGNOSIS — K317 Polyp of stomach and duodenum: Secondary | ICD-10-CM | POA: Diagnosis not present

## 2023-02-24 DIAGNOSIS — Q399 Congenital malformation of esophagus, unspecified: Secondary | ICD-10-CM

## 2023-02-24 DIAGNOSIS — D509 Iron deficiency anemia, unspecified: Secondary | ICD-10-CM | POA: Diagnosis present

## 2023-02-24 DIAGNOSIS — K5730 Diverticulosis of large intestine without perforation or abscess without bleeding: Secondary | ICD-10-CM | POA: Diagnosis not present

## 2023-02-24 DIAGNOSIS — K449 Diaphragmatic hernia without obstruction or gangrene: Secondary | ICD-10-CM | POA: Diagnosis not present

## 2023-02-24 DIAGNOSIS — K2970 Gastritis, unspecified, without bleeding: Secondary | ICD-10-CM | POA: Diagnosis not present

## 2023-02-24 DIAGNOSIS — Z7984 Long term (current) use of oral hypoglycemic drugs: Secondary | ICD-10-CM | POA: Insufficient documentation

## 2023-02-24 DIAGNOSIS — K222 Esophageal obstruction: Secondary | ICD-10-CM

## 2023-02-24 DIAGNOSIS — E1122 Type 2 diabetes mellitus with diabetic chronic kidney disease: Secondary | ICD-10-CM | POA: Insufficient documentation

## 2023-02-24 DIAGNOSIS — K259 Gastric ulcer, unspecified as acute or chronic, without hemorrhage or perforation: Secondary | ICD-10-CM

## 2023-02-24 DIAGNOSIS — D125 Benign neoplasm of sigmoid colon: Secondary | ICD-10-CM

## 2023-02-24 HISTORY — PX: COLONOSCOPY WITH PROPOFOL: SHX5780

## 2023-02-24 HISTORY — PX: ENTEROSCOPY: SHX5533

## 2023-02-24 HISTORY — PX: BIOPSY: SHX5522

## 2023-02-24 HISTORY — PX: POLYPECTOMY: SHX5525

## 2023-02-24 LAB — GLUCOSE, CAPILLARY: Glucose-Capillary: 159 mg/dL — ABNORMAL HIGH (ref 70–99)

## 2023-02-24 SURGERY — ENTEROSCOPY
Anesthesia: Monitor Anesthesia Care

## 2023-02-24 MED ORDER — SODIUM CHLORIDE 0.9 % IV SOLN: INTRAVENOUS | Status: DC

## 2023-02-24 MED ORDER — LIDOCAINE HCL (CARDIAC) PF 100 MG/5ML IV SOSY
PREFILLED_SYRINGE | INTRAVENOUS | Status: DC | PRN
  Administered 2023-02-24: 60 mg via INTRATRACHEAL

## 2023-02-24 MED ORDER — PANTOPRAZOLE SODIUM 40 MG PO TBEC: 40.0000 mg | DELAYED_RELEASE_TABLET | Freq: Every day | ORAL | 11 refills | Status: AC

## 2023-02-24 MED ORDER — PROPOFOL 1000 MG/100ML IV EMUL
INTRAVENOUS | Status: AC
  Filled 2023-02-24: qty 100

## 2023-02-24 MED ORDER — PROPOFOL 500 MG/50ML IV EMUL
INTRAVENOUS | Status: DC | PRN
  Administered 2023-02-24: 60 ug/kg/min via INTRAVENOUS

## 2023-02-24 MED ORDER — SODIUM CHLORIDE 0.9 % IV SOLN: INTRAVENOUS | Status: DC | PRN

## 2023-02-24 MED ORDER — PROPOFOL 10 MG/ML IV BOLUS
INTRAVENOUS | Status: DC | PRN
  Administered 2023-02-24 (×2): 40 mg via INTRAVENOUS

## 2023-02-24 SURGICAL SUPPLY — 21 items

## 2023-02-24 NOTE — Op Note (Signed)
[facility] Patient Name: Jamie Mendez Procedure Date: 02/24/2023 MRN: [id number] Attending MD: Lynann Bologna , MD, [id number] Date of Birth: October 16, 1933 CSN: [id number] Age: 89 Admit Type: Outpatient Procedure:                Colonoscopy Indications:              Iron deficiency anemia Providers:                Lynann Bologna, MD, Doristine Mango, RN, Alan Ripper,                            Technician Referring MD:             Quentin Mulling PA Medicines:                Monitored Anesthesia Care Complications:            No immediate complications. Estimated Blood Loss:     Estimated blood loss: none. Procedure:                Pre-Anesthesia Assessment:                           - Prior to the procedure, a History and Physical                            was performed, and patient medications and                            allergies were reviewed. The patient's tolerance of                            previous anesthesia was also reviewed. The risks                            and benefits of the procedure and the sedation                            options and risks were discussed with the patient.                            All questions were answered, and informed consent                            was obtained. Prior Anticoagulants: The patient has                            taken no anticoagulant or antiplatelet agents. ASA                            Grade Assessment: III - A patient with severe                            systemic disease. After reviewing the risks and  benefits, the patient was deemed in satisfactory                            condition to undergo the procedure.                           After obtaining informed consent, the colonoscope                            was passed under direct vision. Throughout the                            procedure, the patient's blood pressure, pulse, and                            oxygen  saturations were monitored continuously. The                            [id number]-HQ190L ([id number] Olympus colonoscope was                            introduced through the anus and advanced to the 2                            cm into the ileum. The colonoscopy was performed                            without difficulty. The patient tolerated the                            procedure well. The quality of the bowel                            preparation was adequate to identify polyps. The                            terminal ileum, ileocecal valve, appendiceal                            orifice, and rectum were photographed. Scope In: 10:20:47 AM Scope Out: 10:41:00 AM Scope Withdrawal Time: 0 hours 14 minutes 25 seconds  Total Procedure Duration: 0 hours 20 minutes 13 seconds  Findings:      An 8 mm polyp was found in the recto-sigmoid colon. The polyp was       sessile. The polyp was removed with a cold snare. Resection and       retrieval were complete.      A few medium-mouthed diverticula were found in the sigmoid colon.      Non-bleeding internal hemorrhoids were found during retroflexion. The       hemorrhoids were small and Grade I (internal hemorrhoids that do not       prolapse).      The terminal ileum appeared normal.      The exam was otherwise without abnormality on direct and retroflexion       views. Impression:               -  One 8 mm polyp at the recto-sigmoid colon,                            removed with a cold snare. Resected and retrieved.                           - Mild sigmoid diverticulosis.                           - Non-bleeding internal hemorrhoids.                           - The examined portion of the ileum was normal.                           - The examination was otherwise normal on direct                            and retroflexion views. Moderate Sedation:      Not Applicable - Patient had care per Anesthesia. Recommendation:           - Patient has a  contact number available for                            emergencies. The signs and symptoms of potential                            delayed complications were discussed with the                            patient. Return to normal activities tomorrow.                            Written discharge instructions were provided to the                            patient.                           - Resume previous diet.                           - Continue present medications.                           - Await pathology results.                           - Repeat colonoscopy is not recommended for                            surveillance.                           - FU GI clinic in 8-12 weeks. If still with  problems, further workup.                           - The findings and recommendations were discussed                            with the patient's family. Procedure Code(s):        --- Professional ---                           [phone], Colonoscopy, flexible; with removal of                            tumor(s), polyp(s), or other lesion(s) by snare                            technique Diagnosis Code(s):        --- Professional ---                           D12.7, Benign neoplasm of rectosigmoid junction                           K64.0, First degree hemorrhoids                           D50.9, Iron deficiency anemia, unspecified                           K57.30, Diverticulosis of large intestine without                            perforation or abscess without bleeding CPT copyright 2022 American [employer]. All rights reserved. The codes documented in this report are preliminary and upon coder review may  be revised to meet current compliance requirements. Lynann Bologna, MD 02/24/2023 11:03:58 AM This report has been signed electronically. Number of Addenda: 0

## 2023-02-24 NOTE — Anesthesia Preprocedure Evaluation (Signed)
Anesthesia Evaluation  Patient identified by MRN, date of birth, ID band Patient awake    Reviewed: Allergy & Precautions, NPO status , Patient's Chart, lab work & pertinent test results  Airway Mallampati: II  TM Distance: >3 FB     Dental  (+) Dental Advisory Given   Pulmonary neg pulmonary ROS   breath sounds clear to auscultation       Cardiovascular hypertension, Pt. on medications  Rhythm:Regular Rate:Normal     Neuro/Psych negative neurological ROS     GI/Hepatic negative GI ROS, Neg liver ROS,,,  Endo/Other  diabetes, Type 2, Oral Hypoglycemic Agents    Renal/GU Renal disease     Musculoskeletal   Abdominal   Peds  Hematology  (+) Blood dyscrasia, anemia   Anesthesia Other Findings   Reproductive/Obstetrics                             Anesthesia Physical Anesthesia Plan  ASA: 3  Anesthesia Plan: MAC   Post-op Pain Management:    Induction:   PONV Risk Score and Plan: 2 and Propofol infusion  Airway Management Planned: Natural Airway, Simple Face Mask and Nasal Cannula  Additional Equipment:   Intra-op Plan:   Post-operative Plan:   Informed Consent: I have reviewed the patients History and Physical, chart, labs and discussed the procedure including the risks, benefits and alternatives for the proposed anesthesia with the patient or authorized representative who has indicated his/her understanding and acceptance.       Plan Discussed with:   Anesthesia Plan Comments:        Anesthesia Quick Evaluation

## 2023-02-24 NOTE — Transfer of Care (Signed)
Immediate Anesthesia Transfer of Care Note  Patient: Jamie Mendez  Procedure(s) Performed: ENTEROSCOPY COLONOSCOPY WITH PROPOFOL BIOPSY POLYPECTOMY  Patient Location: PACU  Anesthesia Type:MAC  Level of Consciousness: awake  Airway & Oxygen Therapy: Patient Spontanous Breathing and Patient connected to face mask oxygen  Post-op Assessment: Report given to RN and Post -op Vital signs reviewed and stable  Post vital signs: Reviewed and stable  Last Vitals:  Vitals Value Taken Time  BP 133/58 02/24/23 1046  Temp    Pulse 78 02/24/23 1049  Resp 14 02/24/23 1049  SpO2 100 % 02/24/23 1049  Vitals shown include unfiled device data.  Last Pain:  Vitals:   02/24/23 0826  TempSrc: Temporal  PainSc: 0-No pain         Complications: No notable events documented.

## 2023-02-24 NOTE — H&P (Signed)
11/13/2022 Jamie Mendez [id number] 1933-07-08   Referring provider: [facility]* Primary GI doctor: Dr. Chales Abrahams ( Dr. 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 Recent iron infusions due to intolerance of oral iron (diarrhea). Hemoglobin of 9 on October 11th. Negative FOBT in the office today -Repeat CBC and iron studies to assess response to iron infusions. -Schedule for EGD and colonoscopy to evaluate for potential sources of bleeding but will get urgent referral to cardiology first for clearance with history of dizziness/systolic murmur/ DOE   Dizziness/Vertigo Reports feeling off balance, particularly with quick movements or turning, mild DOE.  No associated chest pain Noted heart murmur on examination. - can be from anemia/jardiance new medication -Recommend echocardiogram to evaluate for potential aortic stenosis or other cardiac causes of symptoms. -Consider scheduling EGD and colonoscopy after cardiac clearance, will refer urgently to cardiology and can follow up with PCP for possible vertigo.    Constipation Hard stool noted on rectal exam despite near daily bowel movements. -Recommend MiraLAX and fiber for bowel regulation.   Patient Care Team: [facility] as PCP - [name], MD as Consulting Physician (Ophthalmology) Nils Flack, MD as Referring Physician (Dermatology) Artis Delay, MD as Consulting Physician (Hematology and Oncology)   HISTORY OF PRESENT ILLNESS: 89 y.o. female with a past medical history of hypertension, type 2 diabetes with neuropathy, CKD stage III, pancytopenia marginal zone lymphoma stage IV following with Dr. Bertis Ruddy , IDA and others listed below presents for evaluation of IDA.    10/25/2022 office visit with oncology, CT without any evidence of  lymphoma recurrence.  However CT did note constipation, and patient had IDA with iron 15, ferritin 30 and Hgb 9 IV iron given 10/25/2022 and patient referred here for consideration of endoscopy and colonoscopy.   12/01/2012 colonoscopy, endoscopy for IDA Colonoscopy showed excellent prep with Suprep, mild diverticulosis no source for FOBT positive stool Endoscopy showed superficial gastric ulcer with stigmata of recent hemorrhage, esophageal stricture and erosive esophagitis started on proton pump inhibitor daily. 04/20/2013 capsule endoscopy showed AVM at 2 hours 32 seconds and 2 other tiny AVMs at 4 hours, first AVM is seen almost 2 hours beyond the first duodenal image.  Suggested long-term iron supplements at that time.   Discussed the use of AI scribe software for clinical note transcription with the patient, who gave verbal consent to proceed.   The patient, with a past medical history of lymphoma, diverticulosis, esophageal stenosis, and anemia, presents with ongoing iron deficiency anemia.  The patient has been receiving iron infusions for the past three weeks due to intolerance of oral iron supplements, which caused severe diarrhea. Despite these infusions, the patient's hemoglobin remains low at 9. The patient denies any blood in the stool but reports that the stool was black when taking oral iron supplements.  Denies GERD, dysphagia, nausea, vomiting, AB pain, change in bowel habits, melena, hematochezia.  The patient also reports dizziness, particularly when moving quickly or turning, which has started since beginning the iron infusions. The patient denies chest discomfort, or changes in vision. Has had some DOE.  The patient also reports constipation, despite feeling like she is having regular bowel movements. The patient has a history of diabetes and is currently on medication for this, including Jardiance which is new.   She  reports that she has never  smoked. She has never used  smokeless tobacco. She reports that she does not drink alcohol and does not use drugs.   RELEVANT LABS AND IMAGING: 11/08/2022  HGB 9.0 MCV 77.4 Platelets 263 09/26/2022 Iron 15 Ferritin 30 B12 672     Results   LABS Iron: 15 Ferritin: 30 Hb: 9 (11/08/2022)   RADIOLOGY CT: No recurrence of lymphoma, constipation   DIAGNOSTIC Rectal exam: Hard stool, no masses, negative for blood (11/13/2022)   PATHOLOGY Colonoscopy: Diverticulosis (2014) Endoscopy: Ulcer, esophageal stenosis (2014) Capsule endoscopy: Bleeding AVMs in small intestines (2014) Pathology: Negative (2014)       CBC Labs (Brief)          Component Value Date/Time    WBC 7.2 11/08/2022 0830    RBC 3.81 (L) 11/08/2022 0830    HGB 9.0 (L) 11/08/2022 0830    HGB 8.0 (L) 10/18/2022 1341    HGB 10.6 (L) 01/07/2017 1004    HCT 29.5 (L) 11/08/2022 0830    HCT 31.4 (L) 01/07/2017 1004    PLT 263 11/08/2022 0830    PLT 238 10/18/2022 1341    PLT 237 01/07/2017 1004    MCV 77.4 (L) 11/08/2022 0830    MCV 89.5 01/07/2017 1004    MCH 23.6 (L) 11/08/2022 0830    MCHC 30.5 11/08/2022 0830    RDW 19.9 (H) 11/08/2022 0830    RDW 13.1 01/07/2017 1004    LYMPHSABS 1.0 11/08/2022 0830    LYMPHSABS 0.8 (L) 01/07/2017 1004    MONOABS 0.5 11/08/2022 0830    MONOABS 0.5 01/07/2017 1004    EOSABS 0.1 11/08/2022 0830    EOSABS 0.0 01/07/2017 1004    BASOSABS 0.1 11/08/2022 0830    BASOSABS 0.0 01/07/2017 1004      Recent Labs (within last 365 days)       Recent Labs    09/26/22 1222 10/18/22 1341 11/08/22 0830  HGB 8.2* 8.0* 9.0*        CMP     Labs (Brief)          Component Value Date/Time    NA 136 10/18/2022 1341    NA 136 01/07/2017 1004    K 4.1 10/18/2022 1341    K 4.5 01/07/2017 1004    CL 102 10/18/2022 1341    CO2 25 10/18/2022 1341    CO2 23 01/07/2017 1004    GLUCOSE 253 (H) 10/18/2022 1341    GLUCOSE 217 (H) 01/07/2017 1004    BUN 23 10/18/2022 1341    BUN 23.3 01/07/2017 1004     CREATININE 1.61 (H) 10/18/2022 1341    CREATININE 1.2 (H) 01/07/2017 1004    CALCIUM 8.9 10/18/2022 1341    CALCIUM 9.4 01/07/2017 1004    PROT 6.5 10/18/2022 1341    PROT 6.7 01/07/2017 1004    ALBUMIN 3.6 10/18/2022 1341    ALBUMIN 3.9 01/07/2017 1004    AST 11 (L) 10/18/2022 1341    AST 20 01/07/2017 1004    ALT 12 10/18/2022 1341    ALT 14 01/07/2017 1004    ALKPHOS 96 10/18/2022 1341    ALKPHOS 70 01/07/2017 1004    BILITOT 0.4 10/18/2022 1341    BILITOT 0.49 01/07/2017 1004    GFRNONAA 31 (L) 10/18/2022 1341    GFRNONAA 47 (L) 05/18/2013 1256    GFRAA 40 (L) 09/28/2019 1053    GFRAA 41 (L) 07/06/2018 0959    GFRAA 55 (L) 05/18/2013 1256  Latest Ref Rng & Units 10/18/2022    1:41 PM 09/26/2022   12:22 PM 09/14/2021   11:59 AM  Hepatic Function  Total Protein 6.5 - 8.1 g/dL 6.5  6.6  6.2   Albumin 3.5 - 5.0 g/dL 3.6  3.7  3.7   AST 15 - 41 U/L 11  11  13    ALT 0 - 44 U/L 12  12  15    Alk Phosphatase 38 - 126 U/L 96  99  119   Total Bilirubin 0.3 - 1.2 mg/dL 0.4  0.4  0.5       Current Medications:    Current Outpatient Medications (Endocrine & Metabolic):    alendronate (FOSAMAX) 70 MG tablet, Take 70 mg by mouth once a week.   glimepiride (AMARYL) 4 MG tablet, Take 4 mg by mouth daily with breakfast. Per patient takes in am and pm.   JARDIANCE 10 MG TABS tablet, Take 10 mg by mouth daily.   metFORMIN (GLUCOPHAGE-XR) 500 MG 24 hr tablet, SMARTSIG:1 Tablet(s) By Mouth Morning-Evening   Current Outpatient Medications (Cardiovascular):    amLODipine (NORVASC) 10 MG tablet, TAKE 1 TABLET BY MOUTH AT BEDTIME   enalapril (VASOTEC) 20 MG tablet, Take 20 mg by mouth every morning.   ezetimibe (ZETIA) 10 MG tablet, Take 1 tablet by mouth daily.         Current Outpatient Medications (Other):    Accu-Chek FastClix Lancets MISC, USE 1 TO CHECK GLUCOSE ONCE DAILY   acyclovir (ZOVIRAX) 400 MG tablet, Take 1 tablet by mouth once daily   Blood Glucose Monitoring  Suppl (ACCU-CHEK GUIDE) w/Device KIT, See admin instructions.   calcium carbonate (OSCAL) 1500 (600 Ca) MG TABS tablet, 1 tablet with meals   cholecalciferol (VITAMIN D) 1000 UNITS tablet, Take 1,000 Units by mouth daily.    fish oil-omega-3 fatty acids 1000 MG capsule, Take 1 g by mouth daily.    glucose blood test strip, Use as instructed   Magnesium 500 MG CAPS, Take 500 mg by mouth daily.    Misc Natural Products (LUTEIN 20 PO), Take by mouth daily.   Multiple Vitamins-Minerals (CENTRUM CARDIO) TABS, Take 1 tablet by mouth daily.   Multiple Vitamins-Minerals (ICAPS) CAPS, Take by mouth daily.   Medical History:      Past Medical History:  Diagnosis Date   Anemia     Diabetes (HCC)     High blood pressure     Hyperlipidemia     Marginal zone lymphoma (HCC) 09/02/2013   Neuropathy     Splenomegaly 07/23/2013   Wears glasses     Weight loss 07/23/2013        Allergies:  Allergies       Allergies  Allergen Reactions   Aspirin Other (See Comments)      Nose bleeds   Capoten [Captopril] Other (See Comments)      Mouth numbness    Glyburide Other (See Comments)   Insulin Detemir Other (See Comments)      Decreased blood sugar   Other Other (See Comments)   Saxagliptin Other (See Comments)      Elevates blood sugar   Statins Other (See Comments)      Numbness in face   Zocor [Simvastatin] Other (See Comments)      Extreme fatigue        Surgical History:  She  has a past surgical history that includes Colonoscopy; Polypectomy (1989); Esophagogastroduodenoscopy; Breast surgery (Right, 1989); Tonsillectomy; Axillary lymph node biopsy (Left,  08/23/2013); ir generic historical (03/04/2016); ir generic historical (03/04/2016); Breast biopsy (Right); and IR REMOVAL TUN ACCESS W/ PORT W/O FL MOD SED (05/02/2020). Family History:  Her family history includes Cancer in her mother.   REVIEW OF SYSTEMS  : All other systems reviewed and negative except where noted in the History of  Present Illness.   PHYSICAL EXAM: BP (!) 140/62   Pulse 92   Ht 5\' 4"  (1.626 m)   Wt 149 lb (67.6 kg)   BMI 25.58 kg/m  General Appearance: Well nourished, in no apparent distress. Head:   Normocephalic and atraumatic. Eyes:  sclerae anicteric,conjunctive pink  Respiratory: Respiratory effort normal, BS equal bilaterally without rales, rhonchi, wheezing. Cardio: RRR with harsh systolic murmur RSB. Peripheral pulses intact.  Abdomen: Soft,  Obese ,active bowel sounds. No tenderness . No masses. Rectal: Externally, no hemorrhoids observed. Internally, hard stool palpated without masses. Negative for blood.  Musculoskeletal: Full ROM, Normal gait. Without edema. Skin:  Dry and intact without significant lesions or rashes Neuro: Alert and  oriented x4;  No focal deficits. Psych:  Cooperative. Normal mood and affect.           Doree Albee, PA-C    Attending physician's note   I have taken history, reviewed the chart and examined the patient. I performed a substantive portion of this encounter, including complete performance of at least one of the key components, in conjunction with the APP. I agree with the Advanced Practitioner's note, impression and recommendations.   For push enteroscopy and colon today   Edman Circle, MD [facility] [phone]

## 2023-02-24 NOTE — Telephone Encounter (Signed)
Chart reviewed and noted that pt currently at hospital for procedure.

## 2023-02-24 NOTE — Op Note (Signed)
[facility] Patient Name: Jamie Mendez Procedure Date: 02/24/2023 MRN: [id number] Attending MD: Lynann Bologna , MD, [id number] Date of Birth: 1933/06/29 CSN: [id number] Age: 89 Admit Type: Outpatient Procedure:                Small bowel enteroscopy Indications:              Iron deficiency anemia Providers:                Lynann Bologna, MD, Doristine Mango, RN, Alan Ripper,                            Technician Referring MD:              Medicines:                Monitored Anesthesia Care Complications:            No immediate complications. Estimated Blood Loss:     Estimated blood loss: none. Procedure:                Pre-Anesthesia Assessment:                           - Prior to the procedure, a History and Physical                            was performed, and patient medications and                            allergies were reviewed. The patient's tolerance of                            previous anesthesia was also reviewed. The risks                            and benefits of the procedure and the sedation                            options and risks were discussed with the patient.                            All questions were answered, and informed consent                            was obtained. Prior Anticoagulants: The patient has                            taken no anticoagulant or antiplatelet agents. ASA                            Grade Assessment: III - A patient with severe                            systemic disease. After reviewing the risks and  benefits, the patient was deemed in satisfactory                            condition to undergo the procedure.                           After obtaining informed consent, the endoscope was                            passed under direct vision. Throughout the                            procedure, the patient's blood pressure, pulse, and                            oxygen  saturations were monitored continuously. The                            PCF-H190TL ([id number] Olympus slim colonoscope was                            introduced through the mouth and advanced to the                            proximal jejunum (upto 160 mark). The small bowel                            enteroscopy was accomplished without difficulty.                            The patient tolerated the procedure well. Scope In: Scope Out: Findings:      The lower third of the esophagus was mildly tortuous but normal.      A widely patent and mild Schatzki ring was found at the gastroesophageal       junction at 35 cm from the incisors. Not dilated since not having any       dysphagia.      A 3 cm hiatal hernia was present. 2 small erosions were noted at the       diaphragmatic hiatus consistent with small Cameron erosions      Diffuse minimal inflammation characterized by erythema was found in the       entire examined stomach. Biopsies were taken with a cold forceps for       histology. Some dark gastric contents were noted (?Coffee) which could       easily be washed.      A few 4 to 6 mm sessile polyps with no bleeding and no stigmata of       recent bleeding were found in the gastric fundus and in the gastric       body. Two polyps were removed with a cold snare. Resection and retrieval       were complete.      There was no evidence of significant pathology in the duodenal bulb, in       the first portion of the duodenum and in the second portion of the  duodenum. Biopsies for histology were taken with a cold forceps for       evaluation of celiac disease.      Exam of the jejunum was otherwise normal. Impression:               - Mild Schatzki ring.                           - 3 cm hiatal hernia.                           - Small Cameron erosions                           - Gastritis. Biopsied.                           - A few gastric polyps. Resected and retrieved x 2.                            - Normal duodenal bulb, first portion of the                            duodenum and second portion of the duodenum.                            Biopsied.                           - Normal proximal jejunum. Recommendation:           - Follow biopsies.                           - Protonix 40 mg p.o. daily                           - Trend CBC.                           - Proceed with colonoscopy. Procedure Code(s):        --- Professional ---                           [phone], Small intestinal endoscopy, enteroscopy                            beyond second portion of duodenum, not including                            ileum; with removal of tumor(s), polyp(s), or other                            lesion(s) by snare technique                           [id number], 59, Small intestinal endoscopy, enteroscopy  beyond second portion of duodenum, not including                            ileum; with biopsy, single or multiple Diagnosis Code(s):        --- Professional ---                           Q39.9, Congenital malformation of esophagus,                            unspecified                           K22.2, Esophageal obstruction                           K44.9, Diaphragmatic hernia without obstruction or                            gangrene                           K29.70, Gastritis, unspecified, without bleeding                           K31.7, Polyp of stomach and duodenum                           D50.9, Iron deficiency anemia, unspecified CPT copyright 2022 [employer]. All rights reserved. The codes documented in this report are preliminary and upon coder review may  be revised to meet current compliance requirements. Lynann Bologna, MD 02/24/2023 10:59:44 AM This report has been signed electronically. Number of Addenda: 0

## 2023-02-24 NOTE — Discharge Instructions (Signed)
YOU HAD AN ENDOSCOPIC PROCEDURE TODAY: Refer to the procedure report and other information in the discharge instructions given to you for any specific questions about what was found during the examination. If this information does not answer your questions, please call [place] office at [phone] to clarify.   YOU SHOULD EXPECT: Some feelings of bloating in the abdomen. Passage of more gas than usual. Walking can help get rid of the air that was put into your GI tract during the procedure and reduce the bloating. If you had a lower endoscopy (such as a colonoscopy or flexible sigmoidoscopy) you may notice spotting of blood in your stool or on the toilet paper. Some abdominal soreness may be present for a day or two, also.  DIET: Your first meal following the procedure should be a light meal and then it is ok to progress to your normal diet. A half-sandwich or bowl of soup is an example of a good first meal. Heavy or fried foods are harder to digest and may make you feel nauseous or bloated. Drink plenty of fluids but you should avoid alcoholic beverages for 24 hours. If you had a esophageal dilation, please see attached instructions for diet.    ACTIVITY: Your care partner should take you home directly after the procedure. You should plan to take it easy, moving slowly for the rest of the day. You can resume normal activity the day after the procedure however YOU SHOULD NOT DRIVE, use power tools, machinery or perform tasks that involve climbing or major physical exertion for 24 hours (because of the sedation medicines used during the test).   SYMPTOMS TO REPORT IMMEDIATELY: A gastroenterologist can be reached at any hour. Please call [phone]  for any of the following symptoms:  Following lower endoscopy (colonoscopy, flexible sigmoidoscopy) Excessive amounts of blood in the stool  Significant tenderness, worsening of abdominal pains  Swelling of the abdomen that is new, acute  Fever of 100 or  higher  Following upper endoscopy (EGD, EUS, ERCP, esophageal dilation) Vomiting of blood or coffee ground material  New, significant abdominal pain  New, significant chest pain or pain under the shoulder blades  Painful or persistently difficult swallowing  New shortness of breath  Black, tarry-looking or red, bloody stools  FOLLOW UP:  If any biopsies were taken you will be contacted by phone or by letter within the next 1-3 weeks. Call [phone]  if you have not heard about the biopsies in 3 weeks.  Please also call with any specific questions about appointments or follow up tests.YOU HAD AN ENDOSCOPIC PROCEDURE TODAY: Refer to the procedure report and other information in the discharge instructions given to you for any specific questions about what was found during the examination. If this information does not answer your questions, please call [place] office at [phone] to clarify.   YOU SHOULD EXPECT: Some feelings of bloating in the abdomen. Passage of more gas than usual. Walking can help get rid of the air that was put into your GI tract during the procedure and reduce the bloating. If you had a lower endoscopy (such as a colonoscopy or flexible sigmoidoscopy) you may notice spotting of blood in your stool or on the toilet paper. Some abdominal soreness may be present for a day or two, also.  DIET: Your first meal following the procedure should be a light meal and then it is ok to progress to your normal diet. A half-sandwich or bowl of soup is an example of a  good first meal. Heavy or fried foods are harder to digest and may make you feel nauseous or bloated. Drink plenty of fluids but you should avoid alcoholic beverages for 24 hours. If you had a esophageal dilation, please see attached instructions for diet.    ACTIVITY: Your care partner should take you home directly after the procedure. You should plan to take it easy, moving slowly for the rest of the day. You can resume  normal activity the day after the procedure however YOU SHOULD NOT DRIVE, use power tools, machinery or perform tasks that involve climbing or major physical exertion for 24 hours (because of the sedation medicines used during the test).   SYMPTOMS TO REPORT IMMEDIATELY: A gastroenterologist can be reached at any hour. Please call [phone]  for any of the following symptoms:  Following lower endoscopy (colonoscopy, flexible sigmoidoscopy) Excessive amounts of blood in the stool  Significant tenderness, worsening of abdominal pains  Swelling of the abdomen that is new, acute  Fever of 100 or higher  Following upper endoscopy (EGD, EUS, ERCP, esophageal dilation) Vomiting of blood or coffee ground material  New, significant abdominal pain  New, significant chest pain or pain under the shoulder blades  Painful or persistently difficult swallowing  New shortness of breath  Black, tarry-looking or red, bloody stools  FOLLOW UP:  If any biopsies were taken you will be contacted by phone or by letter within the next 1-3 weeks. Call [phone]  if you have not heard about the biopsies in 3 weeks.  Please also call with any specific questions about appointments or follow up tests.

## 2023-02-25 LAB — SURGICAL PATHOLOGY

## 2023-02-25 NOTE — Anesthesia Postprocedure Evaluation (Signed)
Anesthesia Post Note  Patient: Jamie Mendez  Procedure(s) Performed: ENTEROSCOPY COLONOSCOPY WITH PROPOFOL BIOPSY POLYPECTOMY     Patient location during evaluation: PACU Anesthesia Type: MAC Level of consciousness: awake and alert Pain management: pain level controlled Vital Signs Assessment: post-procedure vital signs reviewed and stable Respiratory status: spontaneous breathing, nonlabored ventilation, respiratory function stable and patient connected to nasal cannula oxygen Cardiovascular status: stable and blood pressure returned to baseline Postop Assessment: no apparent nausea or vomiting Anesthetic complications: no   No notable events documented.  Last Vitals:  Vitals:   02/24/23 1115 02/24/23 1120  BP:    Pulse: 76 77  Resp: 14 17  Temp:    SpO2: 98% 98%    Last Pain:  Vitals:   02/24/23 1105  TempSrc:   PainSc: 0-No pain   Pain Goal:                   Kennieth Rad

## 2023-02-26 ENCOUNTER — Encounter (HOSPITAL_COMMUNITY): Payer: Self-pay | Admitting: Gastroenterology

## 2023-02-28 ENCOUNTER — Encounter: Payer: Self-pay | Admitting: Gastroenterology

## 2023-03-10 ENCOUNTER — Inpatient Hospital Stay: Payer: Medicare Other | Attending: Hematology and Oncology

## 2023-03-10 VITALS — BP 119/54 | HR 76 | Temp 98.2°F | Resp 17

## 2023-03-10 DIAGNOSIS — D500 Iron deficiency anemia secondary to blood loss (chronic): Secondary | ICD-10-CM | POA: Insufficient documentation

## 2023-03-10 DIAGNOSIS — Z95828 Presence of other vascular implants and grafts: Secondary | ICD-10-CM

## 2023-03-10 MED ORDER — SODIUM CHLORIDE 0.9 % IV SOLN
510.0000 mg | Freq: Once | INTRAVENOUS | Status: AC
  Administered 2023-03-10: 510 mg via INTRAVENOUS
  Filled 2023-03-10: qty 510

## 2023-03-10 MED ORDER — SODIUM CHLORIDE 0.9 % IV SOLN: INTRAVENOUS | Status: DC

## 2023-03-10 NOTE — Progress Notes (Signed)
 Patient observed for 20 minutes, post feraheme  infusion. Tolerated treatment well without incident. Vitals stable at discharge, ambulatory to lobby without complaint.

## 2023-03-10 NOTE — Patient Instructions (Signed)

## 2023-03-20 NOTE — Progress Notes (Unsigned)
03/21/2023 Jamie Mendez [id number] Oct 09, 1933  Referring provider: Artis Delay, MD Primary GI doctor: Dr. Chales Abrahams ( Dr. Arlyce Dice 2015)  ASSESSMENT AND PLAN:   Iron Deficiency Anemia Recurrent anemia with history of bleeding arteriovenous malformations (AVMs) in the small intestine capsule endo 2015. Colonoscopy diminutive polyp removed internal hemorrhoids diverticulosis otherwise no source of bleeding no recall Small bowel enteroscopy without AVMs but did show 3 cm hiatal hernia and Cameron lesions Pantoprazole 40 mg once a day and this has helped her swallowing and has helped her symptoms She is on fosamax but no issues with swallowing now Still getting iron infusions with Dr. Bertis Ruddy, last one was 03/10/2023. Has repeat labs 04/17.  Denies melena, hematochezia.   Likely IDA from cameron lesions versus small bowel AVMs 02/20/2023  HGB 9.4 MCV 78.7 Platelets 216 02/20/2023 Iron 21 Ferritin 220 B12 655 Will get repeat labs 04/17 Will given hemoccult cards, if positive can consider VCE or increasing PPI to BID, if patient continues to have iron def anemia can consider VCE  Constipation Hard stool noted on rectal exam despite near daily bowel movements. -Recommend MiraLAX and fiber for bowel regulation.  Patient Care Team: Artis Delay, MD as PCP - General (Hematology and Oncology) Elise Benne, MD as Consulting Physician (Ophthalmology) Nils Flack, MD as Referring Physician (Dermatology) Artis Delay, MD as Consulting Physician (Hematology and Oncology)  HISTORY OF PRESENT ILLNESS: 89 y.o. female with a past medical history of hypertension, type 2 diabetes with neuropathy, CKD stage III, pancytopenia marginal zone lymphoma stage IV following with Dr. Bertis Ruddy , IDA, gastric ulcer/esophageal stenosis, small bowel AVMs and others listed below presents for evaluation of IDA.   10/25/2022 office visit with oncology, CT without any evidence of lymphoma recurrence.  However CT  did note constipation, and patient had IDA with iron 15, ferritin 30 and Hgb 9 IV iron given 10/25/2022 and patient referred here for consideration of endoscopy and colonoscopy.  12/01/2012 colonoscopy, endoscopy for IDA Colonoscopy showed excellent prep with Suprep, mild diverticulosis no source for FOBT positive stool Endoscopy showed superficial gastric ulcer with stigmata of recent hemorrhage, esophageal stricture and erosive esophagitis started on proton pump inhibitor daily. 04/20/2013 capsule endoscopy showed AVM at 2 hours 32 seconds and 2 other tiny AVMs at 4 hours, first AVM is seen almost 2 hours beyond the first duodenal image.  Suggested long-term iron supplements at that time.  Patient seen 11/13/2022 for recurrent anemia with history of bleeding AVMs and small intestine capsule endoscopy 2015, gastric ulcer.  Patient was complaining of dizziness, dyspnea on exertion and systolic murmur referred to cardiology prior to endoscopic valuation. 02/03/18/2025 echocardiogram showed ejection fraction 60 to 65% no wall motion abnormality grade 1 diastolic dysfunction moderately elevated pulmonary artery systolic pressure estimated right ventricular systolic pressure 47 moderate MR and no aortic atherosclerosis  Patient had last iron infusion 2/10 and has repeat labs planned 4/17 with Dr. Tanya Nones. Patient is been on pantoprazole 40 mg once daily denies any reflux prior but was having some intermittent dysphagia which she state has resolved completely with the pantoprazole.  She continues to deny melena, hematochezia, abdominal pain. Patient denies shortness of breath, chest pain.  She  reports that she has never smoked. She has never used smokeless tobacco. She reports that she does not drink alcohol and does not use drugs.  RELEVANT LABS AND IMAGING:  Small bowel enteroscopy 02/24/2023 for IDA - Mild Schatzki ring. - 3 cm hiatal hernia. - Small Cameron erosions -  Gastritis. Biopsied. - A few  gastric polyps. Resected and retrieved x 2. - Normal duodenal bulb, first portion of the duodenum and second portion of the duodenum. Biopsied. - Normal proximal jejunum. A.   SMALL BOWEL, BIOPSY:  Duodenal mucosa with normal villous architecture.  No villous atrophy or increased intraepithelial lymphocytes.  B.   STOMACH, BODY, ANTRUM, BIOPSY  Gastric antral and oxyntic mucosa with mild reactive changes.  Negative for Helicobacter pylori.  C.   STOMACH, POLYPECTOMY:  Fundic gland polyp (s).  Negative for dysplasia or malignancy.   Colonoscopy 02/24/2023 for IDA - One 8 mm polyp at the recto- sigmoid colon, removed with a cold snare. Resected and retrieved. - Mild sigmoid diverticulosis. - Non- bleeding internal hemorrhoids. - The examined portion of the ileum was normal. - The examination was otherwise normal on direct and retroflexion views.  11/08/2022  HGB 9.0 MCV 77.4 Platelets 263 09/26/2022 Iron 15 Ferritin 30 B12 672   CBC    Component Value Date/Time   WBC 6.4 02/20/2023 1116   RBC 3.71 (L) 02/20/2023 1116   HGB 9.4 (L) 02/20/2023 1116   HGB 8.0 (L) 10/18/2022 1341   HGB 10.6 (L) 01/07/2017 1004   HCT 29.2 (L) 02/20/2023 1116   HCT 31.4 (L) 01/07/2017 1004   PLT 216 02/20/2023 1116   PLT 238 10/18/2022 1341   PLT 237 01/07/2017 1004   MCV 78.7 (L) 02/20/2023 1116   MCV 89.5 01/07/2017 1004   MCH 25.3 (L) 02/20/2023 1116   MCHC 32.2 02/20/2023 1116   RDW 14.8 02/20/2023 1116   RDW 13.1 01/07/2017 1004   LYMPHSABS 1.1 02/20/2023 1116   LYMPHSABS 0.8 (L) 01/07/2017 1004   MONOABS 0.4 02/20/2023 1116   MONOABS 0.5 01/07/2017 1004   EOSABS 0.1 02/20/2023 1116   EOSABS 0.0 01/07/2017 1004   BASOSABS 0.1 02/20/2023 1116   BASOSABS 0.0 01/07/2017 1004   Recent Labs    09/26/22 1222 10/18/22 1341 11/08/22 0830 11/13/22 1451 02/20/23 1116  HGB 8.2* 8.0* 9.0* 9.5* 9.4*    CMP     Component Value Date/Time   NA 138 02/20/2023 1116   NA 136 01/07/2017 1004   K  4.2 02/20/2023 1116   K 4.5 01/07/2017 1004   CL 100 02/20/2023 1116   CO2 29 02/20/2023 1116   CO2 23 01/07/2017 1004   GLUCOSE 339 (H) 02/20/2023 1116   GLUCOSE 217 (H) 01/07/2017 1004   BUN 30 (H) 02/20/2023 1116   BUN 23.3 01/07/2017 1004   CREATININE 1.48 (H) 02/20/2023 1116   CREATININE 1.2 (H) 01/07/2017 1004   CALCIUM 9.4 02/20/2023 1116   CALCIUM 9.4 01/07/2017 1004   PROT 6.7 11/13/2022 1451   PROT 6.7 01/07/2017 1004   ALBUMIN 3.8 11/13/2022 1451   ALBUMIN 3.9 01/07/2017 1004   AST 19 11/13/2022 1451   AST 11 (L) 10/18/2022 1341   AST 20 01/07/2017 1004   ALT 18 11/13/2022 1451   ALT 12 10/18/2022 1341   ALT 14 01/07/2017 1004   ALKPHOS 118 (H) 11/13/2022 1451   ALKPHOS 70 01/07/2017 1004   BILITOT 0.4 11/13/2022 1451   BILITOT 0.4 10/18/2022 1341   BILITOT 0.49 01/07/2017 1004   GFRNONAA 34 (L) 02/20/2023 1116   GFRNONAA 47 (L) 05/18/2013 1256   GFRAA 40 (L) 09/28/2019 1053   GFRAA 41 (L) 07/06/2018 0959   GFRAA 55 (L) 05/18/2013 1256      Latest Ref Rng & Units 11/13/2022    2:51 PM 10/18/2022  1:41 PM 09/26/2022   12:22 PM  Hepatic Function  Total Protein 6.0 - 8.3 g/dL 6.7  6.5  6.6   Albumin 3.5 - 5.2 g/dL 3.8  3.6  3.7   AST 0 - 37 U/L 19  11  11    ALT 0 - 35 U/L 18  12  12    Alk Phosphatase 39 - 117 U/L 118  96  99   Total Bilirubin 0.2 - 1.2 mg/dL 0.4  0.4  0.4       Current Medications:   Current Outpatient Medications (Endocrine & Metabolic):    alendronate (FOSAMAX) 70 MG tablet, Take 70 mg by mouth once a week.   glimepiride (AMARYL) 4 MG tablet, Take 4 mg by mouth daily with breakfast. Per patient takes in am and pm.   metFORMIN (GLUCOPHAGE) 500 MG tablet, Take 1 tablet (500 mg total) by mouth 2 (two) times daily with a meal.   JARDIANCE 10 MG TABS tablet, Take 10 mg by mouth daily. (Patient not taking: Reported on 03/21/2023)   metFORMIN (GLUCOPHAGE-XR) 500 MG 24 hr tablet, SMARTSIG:1 Tablet(s) By Mouth Morning-Evening (Patient not  taking: Reported on 03/21/2023)  Current Outpatient Medications (Cardiovascular):    amLODipine (NORVASC) 10 MG tablet, TAKE 1 TABLET BY MOUTH AT BEDTIME   enalapril (VASOTEC) 20 MG tablet, Take 20 mg by mouth every morning.   ezetimibe (ZETIA) 10 MG tablet, Take 1 tablet by mouth daily.     Current Outpatient Medications (Other):    Accu-Chek FastClix Lancets MISC, USE 1 TO CHECK GLUCOSE ONCE DAILY   acyclovir (ZOVIRAX) 400 MG tablet, Take 1 tablet by mouth once daily   Blood Glucose Monitoring Suppl (ACCU-CHEK GUIDE) w/Device KIT, See admin instructions.   calcium carbonate (OSCAL) 1500 (600 Ca) MG TABS tablet, 1 tablet with meals   cholecalciferol (VITAMIN D) 1000 UNITS tablet, Take 1,000 Units by mouth daily.    famotidine (PEPCID) 40 MG tablet, Take 40 mg by mouth daily.   fish oil-omega-3 fatty acids 1000 MG capsule, Take 1 g by mouth daily.    glucose blood test strip, Use as instructed   Magnesium 500 MG CAPS, Take 500 mg by mouth daily.    Misc Natural Products (LUTEIN 20 PO), Take by mouth daily.   Multiple Vitamins-Minerals (CENTRUM CARDIO) TABS, Take 1 tablet by mouth daily.   Multiple Vitamins-Minerals (ICAPS) CAPS, Take by mouth daily. Presavision   Na Sulfate-K Sulfate-Mg Sulf 17.5-3.13-1.6 GM/177ML SOLN, Use as directed; may use generic; goodrx card if insurance will not cover generic   pantoprazole (PROTONIX) 40 MG tablet, Take 1 tablet (40 mg total) by mouth daily.  Medical History:  Past Medical History:  Diagnosis Date   Anemia    Diabetes (HCC)    High blood pressure    Hyperlipidemia    Marginal zone lymphoma (HCC) 09/02/2013   Neuropathy    Splenomegaly 07/23/2013   Wears glasses    Weight loss 07/23/2013   Allergies:  Allergies  Allergen Reactions   Aspirin Other (See Comments)    Nose bleeds   Capoten [Captopril] Other (See Comments)    Mouth numbness    Glyburide Other (See Comments)   Insulin Detemir Other (See Comments)    Decreased blood sugar    Other Other (See Comments)   Saxagliptin Other (See Comments)    Elevates blood sugar   Statins Other (See Comments)    Numbness in face   Zocor [Simvastatin] Other (See Comments)    Extreme fatigue  Surgical History:  She  has a past surgical history that includes Colonoscopy; Polypectomy (1989); Esophagogastroduodenoscopy; Breast surgery (Right, 1989); Tonsillectomy; Axillary lymph node biopsy (Left, 08/23/2013); ir generic historical (03/04/2016); ir generic historical (03/04/2016); Breast biopsy (Right); IR REMOVAL TUN ACCESS W/ PORT W/O FL MOD SED (05/02/2020); enteroscopy (N/A, 02/24/2023); Colonoscopy with propofol (N/A, 02/24/2023); biopsy (02/24/2023); and polypectomy (02/24/2023). Family History:  Her family history includes Cancer in her mother.  REVIEW OF SYSTEMS  : All other systems reviewed and negative except where noted in the History of Present Illness.  PHYSICAL EXAM: BP 112/68   Pulse 88   Ht 5\' 4"  (1.626 m)   Wt 140 lb 4 oz (63.6 kg)   BMI 24.07 kg/m  General Appearance: Well nourished, in no apparent distress. Head:   Normocephalic and atraumatic. Eyes:  sclerae anicteric,conjunctive pink  Respiratory: Respiratory effort normal, BS equal bilaterally without rales, rhonchi, wheezing. Cardio: RRR with holosystolic murmur RSB. Peripheral pulses intact.  Abdomen: Soft,  Obese ,active bowel sounds. No tenderness . No masses. Rectal: Deferred Musculoskeletal: Full ROM, Normal gait. Without edema. Skin:  Dry and intact without significant lesions or rashes Neuro: Alert and  oriented x4;  No focal deficits. Psych:  Cooperative. Normal mood and affect.        Doree Albee, PA-C 12:15 PM

## 2023-03-21 ENCOUNTER — Encounter: Payer: Self-pay | Admitting: Physician Assistant

## 2023-03-21 ENCOUNTER — Other Ambulatory Visit: Payer: Medicare Other

## 2023-03-21 ENCOUNTER — Ambulatory Visit: Payer: Medicare Other | Admitting: Physician Assistant

## 2023-03-21 VITALS — BP 112/68 | HR 88 | Ht 64.0 in | Wt 140.2 lb

## 2023-03-21 DIAGNOSIS — K5900 Constipation, unspecified: Secondary | ICD-10-CM | POA: Diagnosis not present

## 2023-03-21 DIAGNOSIS — Z8719 Personal history of other diseases of the digestive system: Secondary | ICD-10-CM

## 2023-03-21 DIAGNOSIS — K259 Gastric ulcer, unspecified as acute or chronic, without hemorrhage or perforation: Secondary | ICD-10-CM

## 2023-03-21 DIAGNOSIS — K5730 Diverticulosis of large intestine without perforation or abscess without bleeding: Secondary | ICD-10-CM

## 2023-03-21 DIAGNOSIS — D509 Iron deficiency anemia, unspecified: Secondary | ICD-10-CM | POA: Diagnosis not present

## 2023-03-21 DIAGNOSIS — K5904 Chronic idiopathic constipation: Secondary | ICD-10-CM

## 2023-03-21 DIAGNOSIS — K449 Diaphragmatic hernia without obstruction or gangrene: Secondary | ICD-10-CM

## 2023-03-21 DIAGNOSIS — Z8774 Personal history of (corrected) congenital malformations of heart and circulatory system: Secondary | ICD-10-CM

## 2023-03-21 NOTE — Patient Instructions (Addendum)
Your provider has requested that you go to the basement level for lab work before leaving today. Press "B" on the elevator. The lab is located at the first door on the left as you exit the elevator. (Hemoccult)  Miralax is an osmotic laxative.  It only brings more water into the stool.  This is safe to take daily.  Can take up to 17 gram of miralax twice a day.  Mix with juice or coffee.  Start 1 capful at night for 3-4 days and reassess your response in 3-4 days.  You can increase and decrease the dose based on your response.  Remember, it can take up to 3-4 days to take effect OR for the effects to wear off.   I often pair this with benefiber in the morning to help assure the stool is not too loose.   Toileting tips to help with your constipation - Drink at least 64-80 ounces of water/liquid per day. - Establish a time to try to move your bowels every day.  For many people, this is after a cup of coffee or after a meal such as breakfast. - Sit all of the way back on the toilet keeping your back fairly straight and while sitting up, try to rest the tops of your forearms on your upper thighs.   - Raising your feet with a step stool/squatty potty can be helpful to improve the angle that allows your stool to pass through the rectum. - Relax the rectum feeling it bulge toward the toilet water.  If you feel your rectum raising toward your body, you are contracting rather than relaxing. - Breathe in and slowly exhale. "Belly breath" by expanding your belly towards your belly button. Keep belly expanded as you gently direct pressure down and back to the anus.  A low pitched GRRR sound can assist with increasing intra-abdominal pressure.  (Can also trying to blow on a pinwheel and make it move, this helps with the same belly breathing) - Repeat 3-4 times. If unsuccessful, contract the pelvic floor to restore normal tone and get off the toilet.  Avoid excessive straining. - To reduce excessive wiping  by teaching your anus to normally contract, place hands on outer aspect of knees and resist knee movement outward.  Hold 5-10 second then place hands just inside of knees and resist inward movement of knees.  Hold 5 seconds.  Repeat a few times each way.  Go to the ER if unable to pass gas, severe AB pain, unable to hold down food, any shortness of breath of chest pain. Due to recent changes in healthcare laws, you may see the results of your imaging and laboratory studies on MyChart before your provider has had a chance to review them.  We understand that in some cases there may be results that are confusing or concerning to you. Not all laboratory results come back in the same time frame and the provider may be waiting for multiple results in order to interpret others.  Please give [place] 48 hours in order for your provider to thoroughly review all the results before contacting the office for clarification of your results.    I appreciate the  opportunity to care for you  Thank You   [facility]

## 2023-04-14 ENCOUNTER — Other Ambulatory Visit (INDEPENDENT_AMBULATORY_CARE_PROVIDER_SITE_OTHER)

## 2023-04-14 DIAGNOSIS — D509 Iron deficiency anemia, unspecified: Secondary | ICD-10-CM | POA: Diagnosis not present

## 2023-04-14 LAB — HEMOCCULT SLIDES (X 3 CARDS)
Fecal Occult Blood: NEGATIVE
OCCULT 1: NEGATIVE
OCCULT 2: NEGATIVE
OCCULT 3: NEGATIVE
OCCULT 4: NEGATIVE
OCCULT 5: NEGATIVE

## 2023-05-07 ENCOUNTER — Ambulatory Visit: Payer: Medicare Other | Admitting: Podiatry

## 2023-05-07 ENCOUNTER — Encounter: Payer: Self-pay | Admitting: Podiatry

## 2023-05-07 DIAGNOSIS — M79674 Pain in right toe(s): Secondary | ICD-10-CM | POA: Diagnosis not present

## 2023-05-07 DIAGNOSIS — N183 Chronic kidney disease, stage 3 unspecified: Secondary | ICD-10-CM | POA: Diagnosis not present

## 2023-05-07 DIAGNOSIS — M79675 Pain in left toe(s): Secondary | ICD-10-CM | POA: Diagnosis not present

## 2023-05-07 DIAGNOSIS — B351 Tinea unguium: Secondary | ICD-10-CM | POA: Diagnosis not present

## 2023-05-07 DIAGNOSIS — E1121 Type 2 diabetes mellitus with diabetic nephropathy: Secondary | ICD-10-CM

## 2023-05-07 NOTE — Progress Notes (Signed)
This patient returns to my office for at risk foot care.  This patient requires this care by a professional since this patient will be at risk due to having chronic kidney disease and type 2 diabetes.  This patient is unable to cut nails herself since the patient cannot reach her nails.These nails are painful walking and wearing shoes.  This patient presents for at risk foot care today.  General Appearance  Alert, conversant and in no acute stress.  Vascular  Dorsalis pedis and posterior tibial  pulses are palpable  bilaterally.  Capillary return is within normal limits  bilaterally. Temperature is within normal limits  bilaterally.  Neurologic  Senn-Weinstein monofilament wire test within normal limits  bilaterally. Muscle power within normal limits bilaterally.  Nails Thick disfigured discolored nails with subungual debris  from hallux to fifth toes bilaterally. No evidence of bacterial infection or drainage bilaterally.  Orthopedic  No limitations of motion  feet .  No crepitus or effusions noted.  No bony pathology or digital deformities noted.  HAV  B/L.  Skin  normotropic skin with no porokeratosis noted bilaterally.  No signs of infections or ulcers noted.     Onychomycosis  Pain in right toes  Pain in left toes  Consent was obtained for treatment procedures.   Mechanical debridement of nails 1-5  bilaterally performed with a nail nipper.  Filed with dremel without incident.    Return office visit   9  weeks                   Told patient to return for periodic foot care and evaluation due to potential at risk complications.   Gregory Mayer DPM  

## 2023-05-15 ENCOUNTER — Inpatient Hospital Stay: Payer: Medicare Other | Attending: Hematology and Oncology

## 2023-05-15 DIAGNOSIS — Z8572 Personal history of non-Hodgkin lymphomas: Secondary | ICD-10-CM | POA: Diagnosis not present

## 2023-05-15 DIAGNOSIS — D509 Iron deficiency anemia, unspecified: Secondary | ICD-10-CM

## 2023-05-15 DIAGNOSIS — E1121 Type 2 diabetes mellitus with diabetic nephropathy: Secondary | ICD-10-CM | POA: Diagnosis not present

## 2023-05-15 DIAGNOSIS — D500 Iron deficiency anemia secondary to blood loss (chronic): Secondary | ICD-10-CM | POA: Diagnosis present

## 2023-05-15 DIAGNOSIS — C8580 Other specified types of non-Hodgkin lymphoma, unspecified site: Secondary | ICD-10-CM

## 2023-05-15 LAB — CBC WITH DIFFERENTIAL/PLATELET
Abs Immature Granulocytes: 0.04 10*3/uL (ref 0.00–0.07)
Basophils Absolute: 0.1 10*3/uL (ref 0.0–0.1)
Basophils Relative: 1 %
Eosinophils Absolute: 0.1 10*3/uL (ref 0.0–0.5)
Eosinophils Relative: 1 %
HCT: 28.4 % — ABNORMAL LOW (ref 36.0–46.0)
Hemoglobin: 9.5 g/dL — ABNORMAL LOW (ref 12.0–15.0)
Immature Granulocytes: 1 %
Lymphocytes Relative: 21 %
Lymphs Abs: 1.5 10*3/uL (ref 0.7–4.0)
MCH: 27.4 pg (ref 26.0–34.0)
MCHC: 33.5 g/dL (ref 30.0–36.0)
MCV: 81.8 fL (ref 80.0–100.0)
Monocytes Absolute: 0.5 10*3/uL (ref 0.1–1.0)
Monocytes Relative: 7 %
Neutro Abs: 4.9 10*3/uL (ref 1.7–7.7)
Neutrophils Relative %: 69 %
Platelets: 189 10*3/uL (ref 150–400)
RBC: 3.47 MIL/uL — ABNORMAL LOW (ref 3.87–5.11)
RDW: 15.8 % — ABNORMAL HIGH (ref 11.5–15.5)
WBC: 7.1 10*3/uL (ref 4.0–10.5)
nRBC: 0 % (ref 0.0–0.2)

## 2023-05-15 LAB — BASIC METABOLIC PANEL - CANCER CENTER ONLY
Anion gap: 6 (ref 5–15)
BUN: 24 mg/dL — ABNORMAL HIGH (ref 8–23)
CO2: 27 mmol/L (ref 22–32)
Calcium: 9.3 mg/dL (ref 8.9–10.3)
Chloride: 102 mmol/L (ref 98–111)
Creatinine: 1.23 mg/dL — ABNORMAL HIGH (ref 0.44–1.00)
GFR, Estimated: 43 mL/min — ABNORMAL LOW (ref >60)
Glucose, Bld: 338 mg/dL — ABNORMAL HIGH (ref 70–99)
Potassium: 4.3 mmol/L (ref 3.5–5.1)
Sodium: 135 mmol/L (ref 135–145)

## 2023-05-15 LAB — IRON AND IRON BINDING CAPACITY (CC-WL,HP ONLY)
Iron: 25 ug/dL — ABNORMAL LOW (ref 28–170)
Saturation Ratios: 10 % — ABNORMAL LOW (ref 10.4–31.8)
TIBC: 242 ug/dL — ABNORMAL LOW (ref 250–450)
UIBC: 217 ug/dL (ref 148–442)

## 2023-05-15 LAB — VITAMIN B12: Vitamin B-12: 483 pg/mL (ref 180–914)

## 2023-05-15 LAB — FERRITIN: Ferritin: 358 ng/mL — ABNORMAL HIGH (ref 11–307)

## 2023-05-22 ENCOUNTER — Encounter: Payer: Self-pay | Admitting: Hematology and Oncology

## 2023-05-22 ENCOUNTER — Inpatient Hospital Stay (HOSPITAL_BASED_OUTPATIENT_CLINIC_OR_DEPARTMENT_OTHER): Payer: Medicare Other | Admitting: Hematology and Oncology

## 2023-05-22 VITALS — BP 166/54 | HR 78 | Resp 18 | Ht 64.0 in | Wt 145.4 lb

## 2023-05-22 DIAGNOSIS — E1121 Type 2 diabetes mellitus with diabetic nephropathy: Secondary | ICD-10-CM | POA: Diagnosis not present

## 2023-05-22 DIAGNOSIS — D539 Nutritional anemia, unspecified: Secondary | ICD-10-CM | POA: Diagnosis not present

## 2023-05-22 DIAGNOSIS — C8580 Other specified types of non-Hodgkin lymphoma, unspecified site: Secondary | ICD-10-CM | POA: Diagnosis not present

## 2023-05-22 DIAGNOSIS — D500 Iron deficiency anemia secondary to blood loss (chronic): Secondary | ICD-10-CM | POA: Diagnosis not present

## 2023-05-22 NOTE — Assessment & Plan Note (Addendum)
 I reviewed blood work with the patient which showed no evidence of iron deficiency She has blood work most consistent with anemia chronic illness Observe closely Plan to recheck iron studies in 6 months

## 2023-05-22 NOTE — Assessment & Plan Note (Addendum)
 She has poorly controlled diabetes We discussed importance of lifestyle changes and frequent blood sugar monitoring I recommend she reach out to her primary care doctor for further evaluation and management of her diabetes

## 2023-05-22 NOTE — Progress Notes (Signed)
 [facility] OFFICE PROGRESS NOTE  Almeda Jacobs, MD  ASSESSMENT & PLAN:  Assessment & Plan Iron deficiency anemia due to chronic blood loss I reviewed blood work with the patient which showed no evidence of iron deficiency She has blood work most consistent with anemia chronic illness Observe closely Plan to recheck iron studies in 6 months Type 2 diabetes mellitus with diabetic nephropathy, unspecified whether long term insulin  use (HCC) She has poorly controlled diabetes We discussed importance of lifestyle changes and frequent blood sugar monitoring I recommend she reach out to her primary care doctor for further evaluation and management of her diabetes    Orders Placed This Encounter  Procedures   CMP (Cancer Center only)    Standing Status:   Future    Expiration Date:   05/21/2024   Ferritin    Standing Status:   Future    Expiration Date:   05/21/2024   Iron and Iron Binding Capacity ([facility] only)    Standing Status:   Future    Expiration Date:   05/21/2024   CBC with Differential (Cancer Center Only)    Standing Status:   Future    Expiration Date:   05/21/2024   Vitamin B12    Standing Status:   Future    Expiration Date:   05/21/2024    INTERVAL HISTORY: Patient returns for recurrent anemia and history of lymphoma Symptoms of anemia includes fatigue We reviewed CBC, CMP and iron studies  SUMMARY OF HEMATOLOGIC HISTORY: Oncology History Overview Note  Marginal zone lymphoma, with lymph node and bone marrow involvement along with splenomegaly   Primary site: Lymphoid Neoplasms   Staging method: AJCC 6th Edition   Clinical: Stage IV signed by Almeda Jacobs, MD on 09/02/2013  7:38 PM   Summary: Stage IV     Marginal zone lymphoma (HCC)  07/29/2013 Imaging   CT scan shows splenomegaly and abnormal lymphadenopathy.   08/13/2013 Imaging   PET CT scan confirmed lymphadenopathy and splenomegaly, those areas are PET avid   08/23/2013 Surgery    [id number] left axillary lymph node biopsy confirmed a low-grade lymphoma.   08/27/2013 Bone Marrow Biopsy   Bone marrow biopsy confirmed lymphoma.[id number]   09/08/2013 - 09/29/2013 Chemotherapy   She completed 4 cycles of rituximab  with resolution of splenomegaly.   12/29/2013 Imaging   PET scan show complete response.   07/11/2014 Imaging   PET scan showed recurrent disease   07/19/2014 - 08/09/2014 Chemotherapy   She completed 4 cycles of rituximab  with resolution of splenomegaly   10/12/2014 Imaging   Repeat PET/CT scan showed near complete response to treatment.   10/13/2014 - 02/09/2015 Chemotherapy   She is placed on rituximab  maintenance therapy.   03/29/2015 Imaging   PET CT showed possible mild progression of splenomegaly   04/26/2015 - 02/22/2016 Chemotherapy   She started taking Ibrutinib    04/28/2015 Adverse Reaction   Treatment was placed temporarily on hold due to severe diarrhea and resumed at reduced dose   07/03/2015 PET scan   No hypermetabolic adenopathy in the neck, chest, abdomen or pelvis.2. Spleen is not hypermetabolic and appears slightly less prominent size.   02/06/2016 PET scan   Hypermetabolic LEFT axillary lymph nodes. Concern for HIGH-GRADE LYMPHOMA LOCALIZED RECURRENCE. 2. Prominent spleen without hypermetabolic activity. No change in size from comparison. 3. No additional hypermetabolic adenopathy on scan.   02/19/2016 Procedure   Technically successful ultrasound-guided core biopsy, left axillary adenopathy.   02/19/2016 Pathology Results  Lymph node, needle/core biopsy, left axilla - ATYPICAL LYMPHOID PROLIFERATION SUSPICIOUS FOR LYMPHOMA. - SEE COMMENT. Microscopic Comment The sections show multiple very small and skinny fragments of lymph nodal tissue displaying areas of lymphoid expansion by a population of small to medium size lymphocytes with round to irregular nuclei, scanty to moderately abundant cytoplasm, dense chromatin and inconspicuous  nucleoli. This is associated with scattering of "naked" germinal centers with abundant tingible body macrophages. To further evaluate this process, flow cytometric analysis was performed and shows a minor population of monoclonal B cells expressing pan B-cell antigens including CD20 with associated kappa light chain restriction. In addition, immunohistochemical stains were performed including CD20, CD79a, CD3, CD5, CD10, BCL-2, CD21 with appropriate controls. The stains show a mixture of T and B cells with slight predominance of B cells. CD21 highlights numerous areas with dendritic networks, correlating with the presence of germinal centers. CD10 shows focal positivity likely in germinal centers. BCL-2 is diffusely positive except in areas of apparent germinal centers. The overall features are atypical and suspicious for involvement by a B-cell lymphoproliferative process, particularly given the B-cell monoclonality by flow cytometry. However, morphologic evaluation is extremely limited due to small biopsy fragments and additional material (excisional biopsy) is strongly recommended for further evaluation. (BNS:ecj 02/21/2016)    03/04/2016 Procedure   Placement of single lumen port a cath via right internal jugular vein. The catheter tip lies at the cavo-atrial junction. A power injectable port a cath was placed and is ready for immediate use.   03/07/2016 - 05/31/2016 Chemotherapy   She received treatment with bendamustine  and Gazyva    05/27/2016 PET scan   1. No evidence of active lymphoma on whole-body scan. 2. Resolution of metabolic activity previously identified in the LEFT axilla. 3. Small amount free fluid the pelvis.   03/07/2017 PET scan   1. No findings of active lymphoma in the chest, abdomen, or pelvis. 2. Other imaging findings of potential clinical significance: Aortic Atherosclerosis (ICD10-I70.0). Coronary atherosclerosis. Chronic paranasal sinusitis. Moderately distended gallbladder,  chronic   05/02/2020 Procedure   Removal of implanted Port-A-Cath utilizing sharp and blunt dissection. The procedure was uncomplicated.   09/24/2021 Imaging   1. No adenopathy above or below the diaphragm and no splenomegaly and no suspicious soft tissue or osseous lesion identified to suggest lymphomatous disease recurrence. 2. Small hiatal hernia with symmetric distal esophageal wall thickening and reflux/retained contrast in the distal esophagus suggestive of reflux esophagitis consider further evaluation with endoscopy if clinically indicated. 3. Distended gallbladder without CT findings of acute cholecystitis, commonly reflects fasting state, suggest correlation with right upper quadrant tenderness to palpation. 4. Moderate volume of formed stool throughout the colon as can be seen with constipation. 5.  Aortic Atherosclerosis (ICD10-I70.0).      Vitals:   05/22/23 1202  BP: (!) 166/54  Pulse: 78  Resp: 18  SpO2: 100%

## 2023-07-16 ENCOUNTER — Encounter: Payer: Self-pay | Admitting: Podiatry

## 2023-07-16 ENCOUNTER — Ambulatory Visit: Admitting: Podiatry

## 2023-07-16 DIAGNOSIS — M79674 Pain in right toe(s): Secondary | ICD-10-CM | POA: Diagnosis not present

## 2023-07-16 DIAGNOSIS — N183 Chronic kidney disease, stage 3 unspecified: Secondary | ICD-10-CM | POA: Diagnosis not present

## 2023-07-16 DIAGNOSIS — B351 Tinea unguium: Secondary | ICD-10-CM | POA: Diagnosis not present

## 2023-07-16 DIAGNOSIS — E1121 Type 2 diabetes mellitus with diabetic nephropathy: Secondary | ICD-10-CM | POA: Diagnosis not present

## 2023-07-16 DIAGNOSIS — M79675 Pain in left toe(s): Secondary | ICD-10-CM | POA: Diagnosis not present

## 2023-07-16 NOTE — Progress Notes (Signed)
This patient returns to my office for at risk foot care.  This patient requires this care by a professional since this patient will be at risk due to having chronic kidney disease and type 2 diabetes.  This patient is unable to cut nails herself since the patient cannot reach her nails.These nails are painful walking and wearing shoes.  This patient presents for at risk foot care today.  General Appearance  Alert, conversant and in no acute stress.  Vascular  Dorsalis pedis and posterior tibial  pulses are palpable  bilaterally.  Capillary return is within normal limits  bilaterally. Temperature is within normal limits  bilaterally.  Neurologic  Senn-Weinstein monofilament wire test within normal limits  bilaterally. Muscle power within normal limits bilaterally.  Nails Thick disfigured discolored nails with subungual debris  from hallux to fifth toes bilaterally. No evidence of bacterial infection or drainage bilaterally.  Orthopedic  No limitations of motion  feet .  No crepitus or effusions noted.  No bony pathology or digital deformities noted.  HAV  B/L.  Skin  normotropic skin with no porokeratosis noted bilaterally.  No signs of infections or ulcers noted.     Onychomycosis  Pain in right toes  Pain in left toes  Consent was obtained for treatment procedures.   Mechanical debridement of nails 1-5  bilaterally performed with a nail nipper.  Filed with dremel without incident.    Return office visit   9  weeks                   Told patient to return for periodic foot care and evaluation due to potential at risk complications.   Gregory Mayer DPM  

## 2023-08-15 ENCOUNTER — Ambulatory Visit (HOSPITAL_COMMUNITY)
Admission: RE | Admit: 2023-08-15 | Discharge: 2023-08-15 | Disposition: A | Discharge status: Home / Self Care | Source: Ambulatory Visit | Attending: Cardiology | Admitting: Cardiology

## 2023-08-15 DIAGNOSIS — I340 Nonrheumatic mitral (valve) insufficiency: Secondary | ICD-10-CM | POA: Insufficient documentation

## 2023-08-15 DIAGNOSIS — I071 Rheumatic tricuspid insufficiency: Secondary | ICD-10-CM | POA: Diagnosis present

## 2023-08-15 LAB — ECHOCARDIOGRAM COMPLETE
Area-P 1/2: 3.48 cm2
S' Lateral: 1.5 cm

## 2023-08-16 ENCOUNTER — Ambulatory Visit: Payer: Self-pay | Admitting: Cardiology

## 2023-08-16 NOTE — Progress Notes (Signed)
 No significant change in echocardiogram compared to previous study in 01/2023, if anything, mitral regurgitation is less prominent. No routine surveillance echocardiogram monitoring needed unless any new cardiac symptoms develop (shortness of breath, leg edema etc).  Thanks MJP

## 2023-09-17 ENCOUNTER — Ambulatory Visit (INDEPENDENT_AMBULATORY_CARE_PROVIDER_SITE_OTHER): Admitting: Podiatry

## 2023-09-17 ENCOUNTER — Encounter: Payer: Self-pay | Admitting: Podiatry

## 2023-09-17 DIAGNOSIS — M79675 Pain in left toe(s): Secondary | ICD-10-CM

## 2023-09-17 DIAGNOSIS — E1121 Type 2 diabetes mellitus with diabetic nephropathy: Secondary | ICD-10-CM

## 2023-09-17 DIAGNOSIS — M79674 Pain in right toe(s): Secondary | ICD-10-CM

## 2023-09-17 DIAGNOSIS — B351 Tinea unguium: Secondary | ICD-10-CM | POA: Diagnosis not present

## 2023-09-17 DIAGNOSIS — N183 Chronic kidney disease, stage 3 unspecified: Secondary | ICD-10-CM

## 2023-09-17 NOTE — Progress Notes (Signed)
This patient returns to my office for at risk foot care.  This patient requires this care by a professional since this patient will be at risk due to having chronic kidney disease and type 2 diabetes.  This patient is unable to cut nails herself since the patient cannot reach her nails.These nails are painful walking and wearing shoes.  This patient presents for at risk foot care today.  General Appearance  Alert, conversant and in no acute stress.  Vascular  Dorsalis pedis and posterior tibial  pulses are palpable  bilaterally.  Capillary return is within normal limits  bilaterally. Temperature is within normal limits  bilaterally.  Neurologic  Senn-Weinstein monofilament wire test within normal limits  bilaterally. Muscle power within normal limits bilaterally.  Nails Thick disfigured discolored nails with subungual debris  from hallux to fifth toes bilaterally. No evidence of bacterial infection or drainage bilaterally.  Orthopedic  No limitations of motion  feet .  No crepitus or effusions noted.  No bony pathology or digital deformities noted.  HAV  B/L.  Skin  normotropic skin with no porokeratosis noted bilaterally.  No signs of infections or ulcers noted.     Onychomycosis  Pain in right toes  Pain in left toes  Consent was obtained for treatment procedures.   Mechanical debridement of nails 1-5  bilaterally performed with a nail nipper.  Filed with dremel without incident.    Return office visit   9  weeks                   Told patient to return for periodic foot care and evaluation due to potential at risk complications.   Gregory Mayer DPM  

## 2023-11-04 ENCOUNTER — Telehealth: Payer: Self-pay

## 2023-11-04 NOTE — Telephone Encounter (Signed)
 Called and moved appt time of appt on 10/24, she is aware of appt.

## 2023-11-14 ENCOUNTER — Inpatient Hospital Stay: Attending: Hematology and Oncology

## 2023-11-14 DIAGNOSIS — D539 Nutritional anemia, unspecified: Secondary | ICD-10-CM | POA: Insufficient documentation

## 2023-11-14 DIAGNOSIS — C8580 Other specified types of non-Hodgkin lymphoma, unspecified site: Secondary | ICD-10-CM

## 2023-11-14 DIAGNOSIS — Z9221 Personal history of antineoplastic chemotherapy: Secondary | ICD-10-CM | POA: Diagnosis not present

## 2023-11-14 DIAGNOSIS — Z8572 Personal history of non-Hodgkin lymphomas: Secondary | ICD-10-CM | POA: Diagnosis present

## 2023-11-14 LAB — CBC WITH DIFFERENTIAL (CANCER CENTER ONLY)
Abs Immature Granulocytes: 0.02 K/uL (ref 0.00–0.07)
Basophils Absolute: 0 K/uL (ref 0.0–0.1)
Basophils Relative: 1 %
Eosinophils Absolute: 0.1 K/uL (ref 0.0–0.5)
Eosinophils Relative: 1 %
HCT: 29.9 % — ABNORMAL LOW (ref 36.0–46.0)
Hemoglobin: 10.3 g/dL — ABNORMAL LOW (ref 12.0–15.0)
Immature Granulocytes: 0 %
Lymphocytes Relative: 23 %
Lymphs Abs: 1.7 K/uL (ref 0.7–4.0)
MCH: 29.3 pg (ref 26.0–34.0)
MCHC: 34.4 g/dL (ref 30.0–36.0)
MCV: 84.9 fL (ref 80.0–100.0)
Monocytes Absolute: 0.6 K/uL (ref 0.1–1.0)
Monocytes Relative: 8 %
Neutro Abs: 4.9 K/uL (ref 1.7–7.7)
Neutrophils Relative %: 67 %
Platelet Count: 195 K/uL (ref 150–400)
RBC: 3.52 MIL/uL — ABNORMAL LOW (ref 3.87–5.11)
RDW: 14.4 % (ref 11.5–15.5)
WBC Count: 7.3 K/uL (ref 4.0–10.5)
nRBC: 0 % (ref 0.0–0.2)

## 2023-11-14 LAB — CMP (CANCER CENTER ONLY)
ALT: 15 U/L (ref 0–44)
AST: 16 U/L (ref 15–41)
Albumin: 3.9 g/dL (ref 3.5–5.0)
Alkaline Phosphatase: 121 U/L (ref 38–126)
Anion gap: 8 (ref 5–15)
BUN: 31 mg/dL — ABNORMAL HIGH (ref 8–23)
CO2: 27 mmol/L (ref 22–32)
Calcium: 9.5 mg/dL (ref 8.9–10.3)
Chloride: 102 mmol/L (ref 98–111)
Creatinine: 1.45 mg/dL — ABNORMAL HIGH (ref 0.44–1.00)
GFR, Estimated: 35 mL/min — ABNORMAL LOW (ref >60)
Glucose, Bld: 279 mg/dL — ABNORMAL HIGH (ref 70–99)
Potassium: 4.4 mmol/L (ref 3.5–5.1)
Sodium: 137 mmol/L (ref 135–145)
Total Bilirubin: 0.5 mg/dL (ref 0.0–1.2)
Total Protein: 6.5 g/dL (ref 6.5–8.1)

## 2023-11-14 LAB — IRON AND IRON BINDING CAPACITY (CC-WL,HP ONLY)
Iron: 36 ug/dL (ref 28–170)
Saturation Ratios: 14 % (ref 10.4–31.8)
TIBC: 267 ug/dL (ref 250–450)
UIBC: 231 ug/dL (ref 148–442)

## 2023-11-14 LAB — FERRITIN: Ferritin: 376 ng/mL — ABNORMAL HIGH (ref 11–307)

## 2023-11-14 LAB — VITAMIN B12: Vitamin B-12: 595 pg/mL (ref 180–914)

## 2023-11-20 ENCOUNTER — Encounter: Payer: Self-pay | Admitting: Podiatry

## 2023-11-20 ENCOUNTER — Ambulatory Visit: Admitting: Podiatry

## 2023-11-20 DIAGNOSIS — M79675 Pain in left toe(s): Secondary | ICD-10-CM

## 2023-11-20 DIAGNOSIS — B351 Tinea unguium: Secondary | ICD-10-CM | POA: Diagnosis not present

## 2023-11-20 DIAGNOSIS — E1121 Type 2 diabetes mellitus with diabetic nephropathy: Secondary | ICD-10-CM | POA: Diagnosis not present

## 2023-11-20 DIAGNOSIS — N183 Chronic kidney disease, stage 3 unspecified: Secondary | ICD-10-CM

## 2023-11-20 DIAGNOSIS — M79674 Pain in right toe(s): Secondary | ICD-10-CM | POA: Diagnosis not present

## 2023-11-20 NOTE — Progress Notes (Signed)
This patient returns to my office for at risk foot care.  This patient requires this care by a professional since this patient will be at risk due to having chronic kidney disease and type 2 diabetes.  This patient is unable to cut nails herself since the patient cannot reach her nails.These nails are painful walking and wearing shoes.  This patient presents for at risk foot care today.  General Appearance  Alert, conversant and in no acute stress.  Vascular  Dorsalis pedis and posterior tibial  pulses are palpable  bilaterally.  Capillary return is within normal limits  bilaterally. Temperature is within normal limits  bilaterally.  Neurologic  Senn-Weinstein monofilament wire test within normal limits  bilaterally. Muscle power within normal limits bilaterally.  Nails Thick disfigured discolored nails with subungual debris  from hallux to fifth toes bilaterally. No evidence of bacterial infection or drainage bilaterally.  Orthopedic  No limitations of motion  feet .  No crepitus or effusions noted.  No bony pathology or digital deformities noted.  HAV  B/L.  Skin  normotropic skin with no porokeratosis noted bilaterally.  No signs of infections or ulcers noted.     Onychomycosis  Pain in right toes  Pain in left toes  Consent was obtained for treatment procedures.   Mechanical debridement of nails 1-5  bilaterally performed with a nail nipper.  Filed with dremel without incident.    Return office visit   9  weeks                   Told patient to return for periodic foot care and evaluation due to potential at risk complications.   Gregory Mayer DPM  

## 2023-11-21 ENCOUNTER — Inpatient Hospital Stay: Admitting: Hematology and Oncology

## 2023-11-21 ENCOUNTER — Ambulatory Visit: Admitting: Hematology and Oncology

## 2023-11-21 ENCOUNTER — Encounter: Payer: Self-pay | Admitting: Hematology and Oncology

## 2023-11-21 VITALS — BP 187/70 | HR 81 | Temp 97.5°F | Resp 18 | Ht 64.0 in | Wt 145.8 lb

## 2023-11-21 DIAGNOSIS — D539 Nutritional anemia, unspecified: Secondary | ICD-10-CM

## 2023-11-21 DIAGNOSIS — C8580 Other specified types of non-Hodgkin lymphoma, unspecified site: Secondary | ICD-10-CM | POA: Diagnosis not present

## 2023-11-21 DIAGNOSIS — Z8572 Personal history of non-Hodgkin lymphomas: Secondary | ICD-10-CM | POA: Diagnosis not present

## 2023-11-21 MED ORDER — FREESTYLE LIBRE 3 PLUS SENSOR MISC: 3 refills | Status: AC

## 2023-11-21 NOTE — Assessment & Plan Note (Addendum)
 She was originally diagnosed with lymphoma in 2015 when she presented with splenomegaly and abdominal lymphadenopathy She received 4 cycles of rituximab  with complete response in 2015.  She had disease relapse in 2016 and underwent rituximab  again with complete response and maintenance treatment, completed by 2017 She has disease relapse on maintenance treatment and was started on ibrutinib  between 20 17-20 18 In 2018, she received Bendamustine  and obinutuzumab  with complete response CT imaging from September 2024 showed no evidence of recurrent lymphoma She has no clinical signs to suggest cancer recurrence Continue observation

## 2023-11-21 NOTE — Assessment & Plan Note (Addendum)
 The most likely cause of her anemia is anemia chronic kidney disease Iron studies and vitamin B12 study are adequate We discussed aggressive management for diabetes I recommend a trial of continuous blood sugar monitor

## 2023-11-21 NOTE — Progress Notes (Signed)
 [facility] OFFICE PROGRESS NOTE  Patient Care Team: Lonn Hicks, MD as PCP - General (Hematology and Oncology) Robinson Idol, MD as Consulting Physician (Ophthalmology) Hubert Florence, MD as Referring Physician (Dermatology) Lonn Hicks, MD as Consulting Physician (Hematology and Oncology)  Assessment & Plan Marginal zone lymphoma [facility]) She was originally diagnosed with lymphoma in 2015 when she presented with splenomegaly and abdominal lymphadenopathy She received 4 cycles of rituximab  with complete response in 2015.  She had disease relapse in 2016 and underwent rituximab  again with complete response and maintenance treatment, completed by 2017 She has disease relapse on maintenance treatment and was started on ibrutinib  between 20 17-20 18 In 2018, she received Bendamustine  and obinutuzumab  with complete response CT imaging from September 2024 showed no evidence of recurrent lymphoma She has no clinical signs to suggest cancer recurrence Continue observation Deficiency anemia The most likely cause of her anemia is anemia chronic kidney disease Iron studies and vitamin B12 study are adequate We discussed aggressive management for diabetes I recommend a trial of continuous blood sugar monitor  Orders Placed This Encounter  Procedures   CBC with Differential (Cancer Center Only)    Standing Status:   Future    Expiration Date:   11/20/2024   CMP (Cancer Center only)    Standing Status:   Future    Expiration Date:   11/20/2024   Iron and Iron Binding Capacity ([facility] only)    Standing Status:   Future    Expiration Date:   11/20/2024   Ferritin    Standing Status:   Future    Expiration Date:   11/20/2024     Hicks Lonn, MD  INTERVAL HISTORY: she returns for surveillance follow-up for history of recurrent lymphoma and anemia She is doing well No new lymphadenopathy or recent infection  PHYSICAL EXAMINATION: ECOG PERFORMANCE STATUS: 0 -  Asymptomatic  Vitals:   11/21/23 1238  BP: (!) 187/70  Pulse: 81  Resp: 18  Temp: (!) 97.5 F (36.4 C)  SpO2: 100%   Filed Weights   11/21/23 1238  Weight: 145 lb 12.8 oz (66.1 kg)    Relevant data reviewed during this visit included CBC, iron studies, CMP

## 2023-11-24 ENCOUNTER — Telehealth: Payer: Self-pay

## 2023-11-24 NOTE — Telephone Encounter (Signed)
 Oral Oncology Patient Advocate Encounter   Received notification that prior authorization for Freestyle libre 3 plus sensor is required.   PA submitted on 11/24/2023 Key [id number] Status is pending      Charlott Hamilton,  CPhT-Adv  she/her/hers [facility]  [facility] Pharmacy Technician Patient Advocate Specialist III [facility] Phone: [phone]  Fax: [phone] [email or web address]

## 2023-11-24 NOTE — Telephone Encounter (Signed)
 Oral Oncology Patient Advocate Encounter  Received notification that the request for prior authorization for  Freestyle libre 3 plus sensor  has been denied due to .      Charlott Hamilton,  CPhT-Adv  she/her/hers [facility]  [facility] Pharmacy Technician Patient Advocate Specialist III [facility] Phone: [phone]  Fax: [phone] [email or web address]

## 2023-11-25 NOTE — Telephone Encounter (Signed)
 Jamie Mendez, let her know that her insurance denied the glucose monitor At this point, I recommend patient to reach out to her primary care doctor for management

## 2023-11-25 NOTE — Telephone Encounter (Signed)
 Called and left below message. Ask her to call the office for questions and reach out to  PCP for assistance.

## 2024-02-03 ENCOUNTER — Ambulatory Visit: Admitting: Podiatry

## 2024-02-03 DIAGNOSIS — M79674 Pain in right toe(s): Secondary | ICD-10-CM

## 2024-02-03 DIAGNOSIS — B351 Tinea unguium: Secondary | ICD-10-CM

## 2024-02-03 DIAGNOSIS — E1121 Type 2 diabetes mellitus with diabetic nephropathy: Secondary | ICD-10-CM

## 2024-02-03 DIAGNOSIS — M79675 Pain in left toe(s): Secondary | ICD-10-CM

## 2024-02-03 DIAGNOSIS — N183 Chronic kidney disease, stage 3 unspecified: Secondary | ICD-10-CM

## 2024-02-03 NOTE — Progress Notes (Signed)
This patient returns to my office for at risk foot care.  This patient requires this care by a professional since this patient will be at risk due to having chronic kidney disease and type 2 diabetes.  This patient is unable to cut nails herself since the patient cannot reach her nails.These nails are painful walking and wearing shoes.  This patient presents for at risk foot care today.  General Appearance  Alert, conversant and in no acute stress.  Vascular  Dorsalis pedis and posterior tibial  pulses are palpable  bilaterally.  Capillary return is within normal limits  bilaterally. Temperature is within normal limits  bilaterally.  Neurologic  Senn-Weinstein monofilament wire test within normal limits  bilaterally. Muscle power within normal limits bilaterally.  Nails Thick disfigured discolored nails with subungual debris  from hallux to fifth toes bilaterally. No evidence of bacterial infection or drainage bilaterally.  Orthopedic  No limitations of motion  feet .  No crepitus or effusions noted.  No bony pathology or digital deformities noted.  HAV  B/L.  Skin  normotropic skin with no porokeratosis noted bilaterally.  No signs of infections or ulcers noted.     Onychomycosis  Pain in right toes  Pain in left toes  Consent was obtained for treatment procedures.   Mechanical debridement of nails 1-5  bilaterally performed with a nail nipper.  Filed with dremel without incident.    Return office visit   9  weeks                   Told patient to return for periodic foot care and evaluation due to potential at risk complications.   Gregory Mayer DPM  

## 2024-04-06 ENCOUNTER — Ambulatory Visit: Admitting: Podiatry

## 2024-05-14 ENCOUNTER — Inpatient Hospital Stay

## 2024-05-21 ENCOUNTER — Inpatient Hospital Stay: Admitting: Hematology and Oncology
